# Patient Record
Sex: Male | Born: 1935 | Race: Black or African American | Hispanic: No | Marital: Married | Smoking: Former smoker
Health system: Southern US, Community
[De-identification: ages and names within clinical notes are randomized; demographics above are authoritative.]

## PROBLEM LIST (undated history)

## (undated) DIAGNOSIS — I255 Ischemic cardiomyopathy: Secondary | ICD-10-CM

## (undated) DIAGNOSIS — R001 Bradycardia, unspecified: Secondary | ICD-10-CM

## (undated) DIAGNOSIS — I509 Heart failure, unspecified: Secondary | ICD-10-CM

## (undated) DIAGNOSIS — U071 COVID-19: Secondary | ICD-10-CM

## (undated) DIAGNOSIS — I2510 Atherosclerotic heart disease of native coronary artery without angina pectoris: Secondary | ICD-10-CM

## (undated) DIAGNOSIS — I447 Left bundle-branch block, unspecified: Secondary | ICD-10-CM

## (undated) DIAGNOSIS — E785 Hyperlipidemia, unspecified: Secondary | ICD-10-CM

## (undated) DIAGNOSIS — J1282 Pneumonia due to coronavirus disease 2019: Secondary | ICD-10-CM

## (undated) DIAGNOSIS — I10 Essential (primary) hypertension: Secondary | ICD-10-CM

## (undated) DIAGNOSIS — E119 Type 2 diabetes mellitus without complications: Secondary | ICD-10-CM

## (undated) DIAGNOSIS — Z95810 Presence of automatic (implantable) cardiac defibrillator: Secondary | ICD-10-CM

## (undated) DIAGNOSIS — N189 Chronic kidney disease, unspecified: Secondary | ICD-10-CM

## (undated) HISTORY — DX: Pneumonia due to coronavirus disease 2019: J12.82

## (undated) HISTORY — DX: COVID-19: U07.1

## (undated) HISTORY — DX: Type 2 diabetes mellitus without complications: E11.9

## (undated) HISTORY — DX: Left bundle-branch block, unspecified: I44.7

## (undated) HISTORY — DX: Bradycardia, unspecified: R00.1

## (undated) HISTORY — DX: Ischemic cardiomyopathy: I25.5

## (undated) HISTORY — DX: Essential (primary) hypertension: I10

## (undated) HISTORY — DX: Hyperlipidemia, unspecified: E78.5

## (undated) HISTORY — DX: Atherosclerotic heart disease of native coronary artery without angina pectoris: I25.10

---

## 2005-10-15 ENCOUNTER — Encounter: Payer: Self-pay | Admitting: Cardiology

## 2006-06-29 ENCOUNTER — Encounter: Payer: Self-pay | Admitting: Cardiology

## 2008-04-02 ENCOUNTER — Ambulatory Visit: Payer: Self-pay | Admitting: Cardiology

## 2008-04-07 ENCOUNTER — Encounter: Payer: Self-pay | Admitting: Cardiology

## 2008-04-15 ENCOUNTER — Ambulatory Visit: Payer: Self-pay | Admitting: Cardiology

## 2008-04-15 ENCOUNTER — Encounter: Payer: Self-pay | Admitting: Cardiology

## 2008-05-11 ENCOUNTER — Ambulatory Visit: Payer: Self-pay | Admitting: Cardiology

## 2009-03-10 ENCOUNTER — Ambulatory Visit: Payer: Self-pay | Admitting: Cardiology

## 2009-07-28 ENCOUNTER — Encounter: Payer: Self-pay | Admitting: Cardiology

## 2009-08-04 ENCOUNTER — Encounter: Payer: Self-pay | Admitting: Cardiology

## 2009-09-07 ENCOUNTER — Encounter: Payer: Self-pay | Admitting: Cardiology

## 2009-09-08 ENCOUNTER — Ambulatory Visit: Payer: Self-pay | Admitting: Cardiology

## 2010-07-11 ENCOUNTER — Telehealth (INDEPENDENT_AMBULATORY_CARE_PROVIDER_SITE_OTHER): Payer: Self-pay | Admitting: *Deleted

## 2010-07-12 NOTE — Letter (Signed)
Summary: Appointment- Rescheduled  [facility] at [facility] [address]   [place], [place] [address]   Phone: [phone]  Fax: [phone]     August 04, 2009 MRN: [id number]     Allen [name] [address] [place], [place]  [address]      Dear Mr. GEARIN,   Due to a change in our office schedule, your appointment on September 01, 2009                    at  3:00 pm must be changed.    Your new appointment is scheduled for March 30 at 2:00 pm.  We look forward to participating in your health care needs.   Please contact us at the number listed above at your earliest convenience to reschedule this appointment if needed.      Sincerely,  Glass blower/designer

## 2010-07-12 NOTE — Assessment & Plan Note (Signed)
Summary: 6 MONTH FU RECV REMINDER VS   Visit Type:  Follow-up Primary Provider:  Erasmo Downer, MD  CC:  aortic valve disease.  History of Present Illness: The patient is seen for bradycardia and aortic valve disease.  I saw him last September, 2010.  He had some bradycardia.  He has good LV function with some aortic valvular disease.  He had some dizziness.  He is not having any recurrent dizziness.  He has not had chest pain shortness of breath syncope or presyncope.  Preventive Screening-Counseling & Management  Alcohol-Tobacco     Smoking Status: current     Packs/Day: <0.25  Problems Prior to Update: 1)  Abnormal Chest X-ray  () 2)  Aortic Insufficiency  (ICD-424.1) 3)  Aortic Valve Sclerosis  (ICD-424.1) 4)  Bradycardia  (ICD-427.89) 5)  Dizziness  (ICD-780.4) 6)  Tobacco Abuse  (ICD-305.1) 7)  Dyslipidemia  (ICD-272.4) 8)  Hypertension  (ICD-401.9)  Current Medications (verified): 1)  Lisinopril-Hydrochlorothiazide 20-12.5 Mg Tabs (Lisinopril-Hydrochlorothiazide) .... Take 1 Tablet By Mouth Once A Day 2)  Simvastatin 40 Mg Tabs (Simvastatin) .... Take One Tablet By Mouth Daily At Bedtime 3)  Metformin Hcl 500 Mg Tabs (Metformin Hcl) .... Take 1 Tablet By Mouth Two Times A Day 4)  Aspir-Low 81 Mg  (Aspirin) .... Take One Tablet Daily  Allergies (verified): No Known Drug Allergies  Comments:  Nurse/Medical Assistant: The patient's medications and allergies were reviewed with the patient and were updated in the Medication and Allergy Lists. pt has prescription bottles with him Youlanda Mighty RN (September 08, 2009 2:06 PM)  Social History: Packs/Day:  <0.25  Review of Systems       Patient denies fever, chills, headache, sweats, rash, change in vision, change in hearing, chest pain, cough, nausea vomiting, urinary symptoms.  All of the systems are reviewed and are negative.  Vital Signs:  Patient profile:   77 year old male Height:      63 inches Weight:       174.4 pounds Pulse rate:   62 / minute BP sitting:   130 / 70  (left arm) Cuff size:   regular  Vitals Entered By: Youlanda Mighty RN (September 08, 2009 2:04 PM) CC: aortic valve disease   Physical Exam  General:  patient is stable. Head:  head is atraumatic. Eyes:  no xanthelasma. Neck:  no jugular venous distention.  He does have a radiated murmur to the neck. Chest Wall:  there is no chest wall tenderness. Lungs:  lungs are clear.  Respiratory effort is nonlabored. Heart:  cardiac exam reveals S1 and S2.  No clicks.  There is a systolic murmur. Abdomen:  abdomen is soft. Msk:  no musculoskeletal deformities. Extremities:  no peripheral edema. Skin:  no skin rashes. Psych:  patient is oriented to person time and place.  Affect is normal.   Impression & Recommendations:  Problem # 1:  * ABNORMAL CHEST X-RAY In the past the patient had an abnormal chest x-ray questioning whether he had a lesion on his spine.  I arranged for followup films.  These were done and there was no osteolytic lesion.  Therefore he no longer has any significant problem in this regard.  Problem # 2:  AORTIC INSUFFICIENCY (ICD-424.1)  His updated medication list for this problem includes:    Lisinopril-hydrochlorothiazide 20-12.5 Mg Tabs (Lisinopril-hydrochlorothiazide) .Marland Kitchen... Take 1 tablet by mouth once a day Historically there is mild aortic insufficiency.  He does not need a followup echo now  but we will consider it at the time of his next visit.  Problem # 3:  BRADYCARDIA (ICD-427.89)  His updated medication list for this problem includes:    Lisinopril-hydrochlorothiazide 20-12.5 Mg Tabs (Lisinopril-hydrochlorothiazide) .Marland Kitchen... Take 1 tablet by mouth once a day Patient has continued bradycardia.  EKG is done today and reviewed by me.  There is sinus bradycardia.  One PVC is noted.  He does not have symptoms.  No further workup is needed.  Problem # 4:  DIZZINESS (ICD-780.4) He is not having any  ongoing dizziness.  No further workup needed.  Problem # 5:  TOBACCO ABUSE (ICD-305.1) The patient tells me that he is not using any tobacco products.  Other Orders: EKG w/ Interpretation (93000)  Patient Instructions: 1)  Your physician recommends that you continue on your current medications as directed. Please refer to the Current Medication list given to you today. 2)  Your physician wants you to follow-up in: 9 months. You will receive a reminder letter in the mail about two months in advance. If you don't receive a letter, please call our office to schedule the follow-up appointment.

## 2010-07-12 NOTE — Miscellaneous (Signed)
  Clinical Lists Changes  Observations: Added new observation of PAST MED HX: hypertension Hyperlipidemia Tobacco abuse Creatinine 1.4 Dizziness.Maryruth Bun..October, 2009.. Bradycardia noted but dizziness probably vertigo /  Holter monitorNovember, 2009..sinus bradycardia... rare wide complex junctional escape rate at 60 Aortic sclerosis but no stenosis.Marland Kitchen echo October, 2009 Aortic insufficiency.. mild-to-moderate... echo,  October, 2009 left ventricular hypertrophy..mild to moderate..echo.. October, 2009 EF   60-65%.Marland Kitchenecho October, 2009 obstructive sleep apnea...????  possible Question Abn. CXRay 2008  ?? L2. /  spine films 02/2009  OK...no osteolytic lesions seen (09/07/2009 11:21) Added new observation of PRIMARY MD: Linna Darner (09/07/2009 11:21)       Past History:  Past Medical History: hypertension Hyperlipidemia Tobacco abuse Creatinine 1.4 Dizziness.Maryruth Bun..October, 2009.. Bradycardia noted but dizziness probably vertigo /  Holter monitorNovember, 2009..sinus bradycardia... rare wide complex junctional escape rate at 60 Aortic sclerosis but no stenosis.Marland Kitchen echo October, 2009 Aortic insufficiency.. mild-to-moderate... echo,  October, 2009 left ventricular hypertrophy..mild to moderate..echo.. October, 2009 EF   60-65%.Marland Kitchenecho October, 2009 obstructive sleep apnea...????  possible Question Abn. CXRay 2008  ?? L2. /  spine films 02/2009  OK...no osteolytic lesions seen

## 2010-07-20 NOTE — Progress Notes (Signed)
Summary: QUESTION ABOUT MEDS  Phone Note Call from Patient Call back at Home Phone [phone]   Reason for Call: Talk to Nurse Details for Reason: MEDICINE Summary of Call: PATIENT LEFT MESSAGE IN [facility] (MY FAULT). HE HAS A QUESTION ABOUT ANTIBOTICS THAT DR. KATZ HAS HIM ON?????? AN APPOINTMENT WAS FOR MARCH 5 @ 8:30 Initial call taken by: Claudette Laws,  July 11, 2010 9:28 AM  Follow-up for Phone Call        Male answered the phone when returned call. Asked to s/w pt. She states he's not home. Notified male I was returning a call. She said okay and hung up before I could ask her to have pt call the office. Cyril Loosen, RN, BSN  July 11, 2010 4:38 PM   Additional Follow-up for Phone Call Additional follow up Details #1::        PATIENT CALLED BACK THIS AM WANTING TO SPEAK WITH NURSE AND DEMANDING TO BE SEEN END OF JAN. (TODAY) DUE TO DR. KATZ'S TELLING HIM HE NEEDS TO BE SEEN AFTER TAKING 80 PILLS????? PLEASE CALL HIM AT [phone] Additional Follow-up by: Claudette Laws,  July 12, 2010 8:56 AM    Additional Follow-up for Phone Call Additional follow up Details #2::    Spoke with patient. He states he was given a prescription for 80 pills of antibiotic and then he was suppose to see [place] back. He states this appt was suppose to be for the end of the month but he was told by our staff that he did not have an appt with [place]. Upon further questioning, pt is referring to Doxycycline tha twas prescribed by Dr. Ian Bushman. Pt notified he should contact Dr. Gemma Payor office regarding this.   Pt was also reminded that he has f/u appt with [place] for Monday, March 5th at 8:30am following no show on 12/5. Pt reminded to keep this appt. Pt verbalized understanding.  Follow-up by: Cyril Loosen, RN, BSN,  July 12, 2010 10:28 AM

## 2010-08-15 ENCOUNTER — Ambulatory Visit (INDEPENDENT_AMBULATORY_CARE_PROVIDER_SITE_OTHER): Payer: Medicare Other | Admitting: Cardiology

## 2010-08-15 ENCOUNTER — Encounter: Payer: Self-pay | Admitting: Cardiology

## 2010-08-15 DIAGNOSIS — I359 Nonrheumatic aortic valve disorder, unspecified: Secondary | ICD-10-CM

## 2010-08-15 DIAGNOSIS — I498 Other specified cardiac arrhythmias: Secondary | ICD-10-CM

## 2010-08-23 NOTE — Assessment & Plan Note (Signed)
Summary: ROV PER LETTER RCVD -SRS   Visit Type:  Follow-up Primary Provider:  Gerarda Fraction  CC:  aortic valve sclerosis.  History of Present Illness: The patient is seen for followup of aortic valve sclerosis.  He continues to smoke.  I have encouraged him to stop.  He had some dizziness in the past.  He has not had any recent dizziness.  Preventive Screening-Counseling & Management  Alcohol-Tobacco     Smoking Status: current     Smoking Cessation Counseling: yes     Packs/Day: <1/2 PPD  Current Medications (verified): 1)  Lisinopril-Hydrochlorothiazide 20-12.5 Mg Tabs (Lisinopril-Hydrochlorothiazide) .... Take 1 Tablet By Mouth Once A Day 2)  Simvastatin 40 Mg Tabs (Simvastatin) .... Take One Tablet By Mouth Daily At Bedtime 3)  Metformin Hcl 500 Mg Tabs (Metformin Hcl) .... Take 1 Tablet By Mouth Two Times A Day 4)  Aspir-Low 81 Mg  (Aspirin) .... Take One Tablet Daily 5)  Vitamin D3 2000 Unit Caps (Cholecalciferol) .... Take 1 Tablet By Mouth Once A Day 6)  Meloxicam 15 Mg Tabs (Meloxicam) .... Take 1 Tablet By Mouth Once A Day  Allergies (verified): No Known Drug Allergies  Comments:  Nurse/Medical Assistant: The patient's medication bottles and allergies were reviewed with the patient and were updated in the Medication and Allergy Lists.  Past History:  Past Medical History: hypertension Hyperlipidemia Tobacco abuse Creatinine 1.4 Dizziness.Maryruth Bun..October, 2009.. Bradycardia noted but dizziness probably vertigo /  Holter monitorNovember, 2009..sinus bradycardia... rare wide complex junctional escape rate at 60 Aortic sclerosis but no stenosis.Marland Kitchen echo October, 2009 Aortic insufficiency.. mild-to-moderate... echo,  October, 2009 left ventricular hypertrophy..mild to moderate..echo.. October, 2009 EF   60-65%.Marland Kitchenecho October, 2009 obstructive sleep apnea...????  possible Question Abn. CXRay 2008  ?? L2. /  spine films 02/2009  OK...no osteolytic lesions  seen..  Social History: Packs/Day:  <1/2 PPD  Review of Systems       Patient denies fever, chills, headache, sweats, rash, change in vision, change in hearing, chest pain, cough, nausea vomiting, urinary symptoms.  All other systems are reviewed and are negative.  Vital Signs:  Patient profile:   77 year old male Height:      63 inches Weight:      174 pounds BMI:     30.93 Pulse rate:   51 / minute BP sitting:   153 / 90  (left arm) Cuff size:   regular  Vitals Entered By: Carlye Grippe (August 15, 2010 8:22 AM)  Nutrition Counseling: Patient's BMI is greater than 25 and therefore counseled on weight management options.  Physical Exam  General:  patient is stable.  He does smell of cigarette smoke. Head:  head is atraumatic. Eyes:  no xanthelasma. Neck:  no jugular venous distention.  No carotid bruits. Lungs:  lungs are clear.  Respiratory effort is not labored. Heart:  cardiac exam reveals S1 and S2.  There is a 2/6 crescendo decrescendo systolic murmur. Abdomen:  abdomen is soft. Extremities:  no peripheral edema. Psych:  patient is oriented to person time and place.  Affect is normal.   Impression & Recommendations:  Problem # 1:  AORTIC VALVE SCLEROSIS (ICD-424.1) The patient has had aortic valve sclerosis in the past.  His last echo was done in 2009.  I do not hear significant change in the murmur.  He does not need a followup echo now.  We will consider followup next year.  Problem # 2:  BRADYCARDIA (ICD-427.89) EKG is done today and  reviewed by me.  He has continued sinus bradycardia.  This is not causing any problems.  No further workup needed.  Problem # 3:  TOBACCO ABUSE (ICD-305.1) I have counseled the patient to stop smoking.  Problem # 4:  HYPERTENSION (ICD-401.9) The patient's blood pressure is mildly elevated today.  He saw his primary physician recently and his blood pressure was normal.  No change in therapy.  He needs followup blood pressure checks  with his primary physician.  Other Orders: EKG w/ Interpretation (93000)  Patient Instructions: 1)  Your physician wants you to follow-up in: 1 year. You will receive a reminder letter in the mail one-two months in advance. If you don't receive a letter, please call our office to schedule the follow-up appointment. 2)  Your physician recommends that you continue on your current medications as directed. Please refer to the Current Medication list given to you today.

## 2010-10-25 NOTE — Assessment & Plan Note (Signed)
[facility]                          [facility] OFFICE NOTE   NAME:Allen [name], Allen [name]                        MRN:          [id number]  DATE:05/11/2008                            DOB:          [birth year]    Allen [name] was seen in the office.  He had been seen in consultation at  [facility].  That consult was done on April 03, 2008.  At that  time, the patient had hypertension.  He also had dizziness, and we were  asked to be sure he did not have a significant cardiac basis for it.  He  has sinus bradycardia and he had a systolic murmur.  He was discharged  with plans for further outpatient workup.  Since discharge, he has had a  2-D echo.  I have reviewed the report.  He has good LV function.  There  was mild aortic valvular disease.  He has also had a 48-hour Holter  monitor.  I have reviewed the strips and the report.  The patient does  have some sinus bradycardia.  He also has some episodes of a wider-  complex escape rhythm with a rate of approximately 60.  He is not on  beta-blockers at the time of his monitor.  He did not have any  significant symptoms.   Additional problem today is the mention by his wife that he has  significant snoring and periods of apnea at nighttime while sleeping.  I  discussed this with them, and I have recommended that he talk with Dr.  Linna Darner about a possible sleep study.   PAST MEDICAL HISTORY:   ALLERGIES:  No known drug allergies.   MEDICATIONS:  Lisinopril, hydrochlorothiazide, and aspirin.   SOCIAL HISTORY:  The patient is retired from [employer].  He does continue  to smoke intermittently.  He is married.   FAMILY HISTORY:  There is no strong family history of coronary artery  disease at a young age.   REVIEW OF SYSTEMS:  He is not having any fevers or chills.  He has no  headaches.  There are no skin rashes.  He has no GI or GU symptoms.  His  review of systems otherwise is negative.   PHYSICAL  EXAMINATION:  VITAL SIGNS:  Blood pressure is 128/85 with a  pulse of 50.  Weight is 173 pounds.  GENERAL:  The patient is oriented to person, time, and place.  Affect is  normal.  He is here with his wife today.  HEENT:  No xanthelasma.  He has normal extraocular motion.  There are no  carotid bruits.  There is no jugular venous distention.  LUNGS:  Clear.  Respiratory effort is not labored.  CARDIAC:  S1 with an S2.  There is a 2/6 systolic murmur.  ABDOMEN:  Soft.  EXTREMITIES:  He has no peripheral edema.   A 2-D echo was done.  The study shows;  1. Mild-to-moderate concentric left ventricular hypertrophy.  2. Ejection fraction 60-65%.  3. Mild aortic cusp sclerosis.  4. Mild-to-moderate aortic regurgitation.  5. No evidence of aortic stenosis.  The patient had a Holter monitor as described above.   PROBLEMS:  1. History of hypertension, treated at this time.  2. Left ventricular hypertrophy by echo.  3. Hyperlipidemia.  This needs to be assessed through Dr. Linna Darner.  4. Smoking history.  He once again is reminded to stop.  5. Creatinine 1.4.  6. Systolic murmur.  This is from his aortic sclerosis and mild-to-      moderate aortic regurgitation.  7. Normal systolic left ventricular function.  8. Presentation to the hospital with dizziness.  I still think this      was mostly vertigo.  We have to follow him, however, regarding his      bradycardia.  9. Sinus bradycardia.  He is stable.  However, we cannot use beta-      blockers.  He does have a wide-complex escape rhythm that occurs at      the rate of approximately 60 beats per minute, and this has to be      kept in mind.  10.Description by his wife of probable sleep apnea.  I have asked him      to discuss with Dr. Linna Darner this finding and decide if a sleep study      would be appropriate.   The patient is to be followed.  I have recommended no further tests.  If  he has further symptoms, he may need further evaluation  and a pacemaker  might be needed.  However, at this point, he can be followed off any  beta-blockers.     Luis Abed, MD, [facility]  Electronically Signed    JDK/MedQ  DD: 05/11/2008  DT: 05/11/2008  Job #: [id number]   cc:   Erasmo Downer, MD

## 2010-12-08 ENCOUNTER — Encounter: Payer: Self-pay | Admitting: Cardiology

## 2011-08-10 DIAGNOSIS — I10 Essential (primary) hypertension: Secondary | ICD-10-CM | POA: Diagnosis not present

## 2011-08-10 DIAGNOSIS — E782 Mixed hyperlipidemia: Secondary | ICD-10-CM | POA: Diagnosis not present

## 2011-08-10 DIAGNOSIS — N401 Enlarged prostate with lower urinary tract symptoms: Secondary | ICD-10-CM | POA: Diagnosis not present

## 2011-08-18 ENCOUNTER — Ambulatory Visit: Payer: Medicare Other | Admitting: Cardiology

## 2011-08-18 ENCOUNTER — Encounter: Payer: Self-pay | Admitting: Cardiology

## 2011-08-18 DIAGNOSIS — I10 Essential (primary) hypertension: Secondary | ICD-10-CM | POA: Insufficient documentation

## 2011-08-18 DIAGNOSIS — E785 Hyperlipidemia, unspecified: Secondary | ICD-10-CM | POA: Insufficient documentation

## 2011-08-18 DIAGNOSIS — R42 Dizziness and giddiness: Secondary | ICD-10-CM | POA: Insufficient documentation

## 2011-08-18 DIAGNOSIS — R9430 Abnormal result of cardiovascular function study, unspecified: Secondary | ICD-10-CM | POA: Insufficient documentation

## 2011-08-18 DIAGNOSIS — I517 Cardiomegaly: Secondary | ICD-10-CM | POA: Insufficient documentation

## 2011-08-18 DIAGNOSIS — R9389 Abnormal findings on diagnostic imaging of other specified body structures: Secondary | ICD-10-CM | POA: Insufficient documentation

## 2011-08-18 DIAGNOSIS — R001 Bradycardia, unspecified: Secondary | ICD-10-CM | POA: Insufficient documentation

## 2011-08-18 DIAGNOSIS — I358 Other nonrheumatic aortic valve disorders: Secondary | ICD-10-CM | POA: Insufficient documentation

## 2011-08-18 DIAGNOSIS — G4733 Obstructive sleep apnea (adult) (pediatric): Secondary | ICD-10-CM | POA: Insufficient documentation

## 2011-08-18 DIAGNOSIS — I351 Nonrheumatic aortic (valve) insufficiency: Secondary | ICD-10-CM | POA: Insufficient documentation

## 2011-08-18 DIAGNOSIS — Z720 Tobacco use: Secondary | ICD-10-CM | POA: Insufficient documentation

## 2011-08-18 DIAGNOSIS — N189 Chronic kidney disease, unspecified: Secondary | ICD-10-CM | POA: Insufficient documentation

## 2011-09-04 ENCOUNTER — Ambulatory Visit (INDEPENDENT_AMBULATORY_CARE_PROVIDER_SITE_OTHER): Payer: Medicare Other | Admitting: Cardiology

## 2011-09-04 ENCOUNTER — Encounter: Payer: Self-pay | Admitting: Cardiology

## 2011-09-04 VITALS — BP 118/76 | HR 48 | Ht 62.0 in | Wt 176.8 lb

## 2011-09-04 DIAGNOSIS — R42 Dizziness and giddiness: Secondary | ICD-10-CM

## 2011-09-04 DIAGNOSIS — R001 Bradycardia, unspecified: Secondary | ICD-10-CM

## 2011-09-04 DIAGNOSIS — I359 Nonrheumatic aortic valve disorder, unspecified: Secondary | ICD-10-CM | POA: Diagnosis not present

## 2011-09-04 DIAGNOSIS — I351 Nonrheumatic aortic (valve) insufficiency: Secondary | ICD-10-CM

## 2011-09-04 DIAGNOSIS — Z720 Tobacco use: Secondary | ICD-10-CM

## 2011-09-04 DIAGNOSIS — I498 Other specified cardiac arrhythmias: Secondary | ICD-10-CM | POA: Diagnosis not present

## 2011-09-04 DIAGNOSIS — F17200 Nicotine dependence, unspecified, uncomplicated: Secondary | ICD-10-CM | POA: Diagnosis not present

## 2011-09-04 DIAGNOSIS — I10 Essential (primary) hypertension: Secondary | ICD-10-CM

## 2011-09-04 DIAGNOSIS — I358 Other nonrheumatic aortic valve disorders: Secondary | ICD-10-CM

## 2011-09-04 NOTE — Patient Instructions (Signed)
Your physician you to follow up in 1 year. You will receive a reminder letter in the mail one-two months in advance. If you don't receive a letter, please call our office to schedule the follow-up appointment. Your physician recommends that you continue on your current medications as directed. Please refer to the Current Medication list given to you today. 

## 2011-09-04 NOTE — Assessment & Plan Note (Signed)
Patient continues to smoke a small amount. I've encouraged him to stop completely.

## 2011-09-04 NOTE — Assessment & Plan Note (Signed)
Patient has chronic mild sinus bradycardia. This has not changed. No change in therapy.

## 2011-09-04 NOTE — Assessment & Plan Note (Signed)
He's had no recurrent significant dizziness. No further workup.

## 2011-09-04 NOTE — Assessment & Plan Note (Signed)
Blood pressure is controlled. No change in therapy. 

## 2011-09-04 NOTE — Progress Notes (Signed)
HPI Patient is seen today to followup a history of mild aortic valve disease and an episode of severe dizziness in the remote past. I saw him last March, 2012. He's doing well. Unfortunately he continues to smoke a small amount. He's not had any syncope or presyncope. There is no chest pain. There is no significant shortness of breath. He's not having any significant palpitations.  As part of today's evaluation I have completely reviewed his old cardiac records and I have completely updated the new electronic medical record.  No Known Allergies  Current Outpatient Prescriptions  Medication Sig Dispense Refill  . aspirin 81 MG tablet Take 81 mg by mouth daily.        Marland Kitchen lisinopril-hydrochlorothiazide (PRINZIDE,ZESTORETIC) 20-12.5 MG per tablet Take 1 tablet by mouth daily.        . meloxicam (MOBIC) 7.5 MG tablet Take 7.5 mg by mouth daily.      . metFORMIN (GLUCOPHAGE) 500 MG tablet Take 500 mg by mouth 2 (two) times daily.        . simvastatin (ZOCOR) 40 MG tablet Take 40 mg by mouth at bedtime.          History   Social History  . Marital Status: Married    Spouse Name: N/A    Number of Children: N/A  . Years of Education: N/A   Occupational History  . [name]     Retired   Social History Main Topics  . Smoking status: Current Some Day Smoker  . Smokeless tobacco: Not on file  . Alcohol Use: Not on file  . Drug Use: Not on file  . Sexually Active: Not on file   Other Topics Concern  . Not on file   Social History Narrative  . No narrative on file    Family History  Problem Relation Age of Onset  . Coronary artery disease Neg Hx     No strong family history    Past Medical History  Diagnosis Date  . Hypertension   . Hyperlipidemia   . Tobacco abuse   . Chronic kidney disease     Creatinine 1.4  . Dizziness 10/09    [place]; bradycardia noted but dizziness probably vertigo  . Sinus bradycardia 11/09    Holter monitor; rare wide complex junctional escape  rate at 60  . Aortic valve sclerosis 10/09    No stenosis, echo, October, 2009  . Aortic insufficiency 10/09    Mild to moderate, Echo, October, 2009  . Left ventricular hypertrophy 10/09    , Mild to moderate, echo,  2009  . Obstructive sleep apnea     ??? possible  . Abnormal chest x-ray 1. 2008  2. 9/10    1. Question Abn. CXRay ?? L2  2. Spine films, OK no osteolytic lesions seen  . Ejection fraction      EF 60-65%, echo, October, 2009    No past surgical history on file.  ROS  Patient denies fever, chills, headache, sweats, rash, change in vision, change in hearing, chest pain, cough, nausea vomiting, urinary symptoms. All other systems are reviewed and are negative.  PHYSICAL EXAM  Patient is stable. He is oriented to person time and place. Affect is normal. Head is atraumatic. There is no xanthelasma. Carotid upstrokes are good. There is no jugulovenous distention. Lungs are clear. Respiratory effort is nonlabored. Cardiac exam reveals S1 and S2. There is a 2/6 systolic murmur area the abdomen is soft. There is no peripheral  edema. There no musculoskeletal deformities. There are no skin rashes.  Filed Vitals:   09/04/11 1410  BP: 118/76  Pulse: 48  Height: 5\' 2"  (1.575 m)  Weight: 176 lb 12.8 oz (80.196 kg)  SpO2: 98%   EKG is done today and reviewed by me. There is sinus bradycardia with a rate of 50.  ASSESSMENT & PLAN

## 2011-09-04 NOTE — Assessment & Plan Note (Signed)
Patient has aortic valvular disease. There is no evidence on physical exam that he has had any significant change. I decided not to do an echo but we will plan to do one next year.

## 2011-10-20 ENCOUNTER — Encounter: Payer: Self-pay | Admitting: Cardiology

## 2011-10-20 DIAGNOSIS — R05 Cough: Secondary | ICD-10-CM | POA: Diagnosis not present

## 2011-10-20 DIAGNOSIS — I517 Cardiomegaly: Secondary | ICD-10-CM | POA: Diagnosis not present

## 2011-10-20 DIAGNOSIS — E119 Type 2 diabetes mellitus without complications: Secondary | ICD-10-CM | POA: Diagnosis not present

## 2011-10-20 DIAGNOSIS — I10 Essential (primary) hypertension: Secondary | ICD-10-CM | POA: Diagnosis not present

## 2011-10-20 DIAGNOSIS — F17200 Nicotine dependence, unspecified, uncomplicated: Secondary | ICD-10-CM | POA: Diagnosis not present

## 2011-10-20 DIAGNOSIS — Z79899 Other long term (current) drug therapy: Secondary | ICD-10-CM | POA: Diagnosis not present

## 2011-10-20 DIAGNOSIS — R0989 Other specified symptoms and signs involving the circulatory and respiratory systems: Secondary | ICD-10-CM | POA: Diagnosis not present

## 2011-10-20 DIAGNOSIS — R0602 Shortness of breath: Secondary | ICD-10-CM | POA: Diagnosis not present

## 2011-10-20 DIAGNOSIS — J209 Acute bronchitis, unspecified: Secondary | ICD-10-CM | POA: Diagnosis not present

## 2011-10-20 DIAGNOSIS — Z7982 Long term (current) use of aspirin: Secondary | ICD-10-CM | POA: Diagnosis not present

## 2011-10-26 DIAGNOSIS — J209 Acute bronchitis, unspecified: Secondary | ICD-10-CM | POA: Diagnosis not present

## 2011-11-09 DIAGNOSIS — J449 Chronic obstructive pulmonary disease, unspecified: Secondary | ICD-10-CM | POA: Diagnosis not present

## 2011-11-09 DIAGNOSIS — I10 Essential (primary) hypertension: Secondary | ICD-10-CM | POA: Diagnosis not present

## 2011-11-24 DIAGNOSIS — E119 Type 2 diabetes mellitus without complications: Secondary | ICD-10-CM | POA: Diagnosis not present

## 2011-11-24 DIAGNOSIS — R972 Elevated prostate specific antigen [PSA]: Secondary | ICD-10-CM | POA: Diagnosis not present

## 2012-02-09 DIAGNOSIS — H109 Unspecified conjunctivitis: Secondary | ICD-10-CM | POA: Diagnosis not present

## 2012-02-09 DIAGNOSIS — J449 Chronic obstructive pulmonary disease, unspecified: Secondary | ICD-10-CM | POA: Diagnosis not present

## 2012-05-30 DIAGNOSIS — J449 Chronic obstructive pulmonary disease, unspecified: Secondary | ICD-10-CM | POA: Diagnosis not present

## 2012-05-30 DIAGNOSIS — E782 Mixed hyperlipidemia: Secondary | ICD-10-CM | POA: Diagnosis not present

## 2012-06-10 DIAGNOSIS — Z23 Encounter for immunization: Secondary | ICD-10-CM | POA: Diagnosis not present

## 2012-07-26 DIAGNOSIS — Z7982 Long term (current) use of aspirin: Secondary | ICD-10-CM | POA: Diagnosis not present

## 2012-07-26 DIAGNOSIS — J019 Acute sinusitis, unspecified: Secondary | ICD-10-CM | POA: Diagnosis not present

## 2012-07-26 DIAGNOSIS — Z79899 Other long term (current) drug therapy: Secondary | ICD-10-CM | POA: Diagnosis not present

## 2012-07-26 DIAGNOSIS — J329 Chronic sinusitis, unspecified: Secondary | ICD-10-CM | POA: Diagnosis not present

## 2012-07-26 DIAGNOSIS — E119 Type 2 diabetes mellitus without complications: Secondary | ICD-10-CM | POA: Diagnosis not present

## 2012-07-26 DIAGNOSIS — I10 Essential (primary) hypertension: Secondary | ICD-10-CM | POA: Diagnosis not present

## 2012-07-26 DIAGNOSIS — Z87891 Personal history of nicotine dependence: Secondary | ICD-10-CM | POA: Diagnosis not present

## 2012-08-29 DIAGNOSIS — J449 Chronic obstructive pulmonary disease, unspecified: Secondary | ICD-10-CM | POA: Diagnosis not present

## 2012-08-29 DIAGNOSIS — Z0000 Encounter for general adult medical examination without abnormal findings: Secondary | ICD-10-CM | POA: Diagnosis not present

## 2012-08-29 DIAGNOSIS — Z125 Encounter for screening for malignant neoplasm of prostate: Secondary | ICD-10-CM | POA: Diagnosis not present

## 2012-09-02 ENCOUNTER — Ambulatory Visit: Payer: Medicare Other | Admitting: Cardiology

## 2012-09-18 DIAGNOSIS — Z23 Encounter for immunization: Secondary | ICD-10-CM | POA: Diagnosis not present

## 2012-09-23 DIAGNOSIS — R972 Elevated prostate specific antigen [PSA]: Secondary | ICD-10-CM | POA: Diagnosis not present

## 2012-09-23 DIAGNOSIS — R3915 Urgency of urination: Secondary | ICD-10-CM | POA: Diagnosis not present

## 2012-09-23 DIAGNOSIS — R39198 Other difficulties with micturition: Secondary | ICD-10-CM | POA: Diagnosis not present

## 2012-09-30 DIAGNOSIS — E7800 Pure hypercholesterolemia, unspecified: Secondary | ICD-10-CM | POA: Diagnosis not present

## 2012-09-30 DIAGNOSIS — E119 Type 2 diabetes mellitus without complications: Secondary | ICD-10-CM | POA: Diagnosis not present

## 2012-09-30 DIAGNOSIS — Z13820 Encounter for screening for osteoporosis: Secondary | ICD-10-CM | POA: Diagnosis not present

## 2012-09-30 DIAGNOSIS — Z7982 Long term (current) use of aspirin: Secondary | ICD-10-CM | POA: Diagnosis not present

## 2012-09-30 DIAGNOSIS — Z79899 Other long term (current) drug therapy: Secondary | ICD-10-CM | POA: Diagnosis not present

## 2012-09-30 DIAGNOSIS — M129 Arthropathy, unspecified: Secondary | ICD-10-CM | POA: Diagnosis not present

## 2012-09-30 DIAGNOSIS — M810 Age-related osteoporosis without current pathological fracture: Secondary | ICD-10-CM | POA: Diagnosis not present

## 2012-09-30 DIAGNOSIS — I10 Essential (primary) hypertension: Secondary | ICD-10-CM | POA: Diagnosis not present

## 2012-11-26 DIAGNOSIS — R972 Elevated prostate specific antigen [PSA]: Secondary | ICD-10-CM | POA: Diagnosis not present

## 2012-11-26 DIAGNOSIS — I10 Essential (primary) hypertension: Secondary | ICD-10-CM | POA: Diagnosis not present

## 2012-11-26 DIAGNOSIS — R39198 Other difficulties with micturition: Secondary | ICD-10-CM | POA: Diagnosis not present

## 2012-11-26 DIAGNOSIS — N401 Enlarged prostate with lower urinary tract symptoms: Secondary | ICD-10-CM | POA: Diagnosis not present

## 2012-11-26 DIAGNOSIS — R351 Nocturia: Secondary | ICD-10-CM | POA: Diagnosis not present

## 2012-11-26 DIAGNOSIS — R350 Frequency of micturition: Secondary | ICD-10-CM | POA: Diagnosis not present

## 2012-11-29 DIAGNOSIS — J449 Chronic obstructive pulmonary disease, unspecified: Secondary | ICD-10-CM | POA: Diagnosis not present

## 2012-11-29 DIAGNOSIS — I10 Essential (primary) hypertension: Secondary | ICD-10-CM | POA: Diagnosis not present

## 2012-11-29 DIAGNOSIS — E782 Mixed hyperlipidemia: Secondary | ICD-10-CM | POA: Diagnosis not present

## 2013-03-04 DIAGNOSIS — I10 Essential (primary) hypertension: Secondary | ICD-10-CM | POA: Diagnosis not present

## 2013-03-04 DIAGNOSIS — E782 Mixed hyperlipidemia: Secondary | ICD-10-CM | POA: Diagnosis not present

## 2013-04-04 DIAGNOSIS — Z23 Encounter for immunization: Secondary | ICD-10-CM | POA: Diagnosis not present

## 2013-06-10 DIAGNOSIS — I10 Essential (primary) hypertension: Secondary | ICD-10-CM | POA: Diagnosis not present

## 2013-06-10 DIAGNOSIS — E782 Mixed hyperlipidemia: Secondary | ICD-10-CM | POA: Diagnosis not present

## 2013-06-17 DIAGNOSIS — I10 Essential (primary) hypertension: Secondary | ICD-10-CM | POA: Diagnosis not present

## 2013-06-17 DIAGNOSIS — E119 Type 2 diabetes mellitus without complications: Secondary | ICD-10-CM | POA: Diagnosis not present

## 2013-06-17 DIAGNOSIS — J209 Acute bronchitis, unspecified: Secondary | ICD-10-CM | POA: Diagnosis not present

## 2013-06-17 DIAGNOSIS — J069 Acute upper respiratory infection, unspecified: Secondary | ICD-10-CM | POA: Diagnosis not present

## 2013-06-17 DIAGNOSIS — R059 Cough, unspecified: Secondary | ICD-10-CM | POA: Diagnosis not present

## 2013-06-17 DIAGNOSIS — R05 Cough: Secondary | ICD-10-CM | POA: Diagnosis not present

## 2013-08-29 ENCOUNTER — Encounter: Payer: Self-pay | Admitting: Cardiology

## 2013-08-29 ENCOUNTER — Ambulatory Visit (INDEPENDENT_AMBULATORY_CARE_PROVIDER_SITE_OTHER): Payer: Medicare Other | Admitting: Cardiology

## 2013-08-29 VITALS — BP 143/82 | HR 52 | Ht 62.0 in | Wt 180.1 lb

## 2013-08-29 DIAGNOSIS — I358 Other nonrheumatic aortic valve disorders: Secondary | ICD-10-CM

## 2013-08-29 DIAGNOSIS — I498 Other specified cardiac arrhythmias: Secondary | ICD-10-CM | POA: Diagnosis not present

## 2013-08-29 DIAGNOSIS — I359 Nonrheumatic aortic valve disorder, unspecified: Secondary | ICD-10-CM

## 2013-08-29 DIAGNOSIS — I10 Essential (primary) hypertension: Secondary | ICD-10-CM

## 2013-08-29 DIAGNOSIS — F17200 Nicotine dependence, unspecified, uncomplicated: Secondary | ICD-10-CM

## 2013-08-29 DIAGNOSIS — R42 Dizziness and giddiness: Secondary | ICD-10-CM | POA: Diagnosis not present

## 2013-08-29 DIAGNOSIS — E785 Hyperlipidemia, unspecified: Secondary | ICD-10-CM | POA: Diagnosis not present

## 2013-08-29 DIAGNOSIS — R001 Bradycardia, unspecified: Secondary | ICD-10-CM

## 2013-08-29 DIAGNOSIS — R0989 Other specified symptoms and signs involving the circulatory and respiratory systems: Secondary | ICD-10-CM

## 2013-08-29 DIAGNOSIS — R0609 Other forms of dyspnea: Secondary | ICD-10-CM | POA: Insufficient documentation

## 2013-08-29 DIAGNOSIS — R9430 Abnormal result of cardiovascular function study, unspecified: Secondary | ICD-10-CM

## 2013-08-29 DIAGNOSIS — Z720 Tobacco use: Secondary | ICD-10-CM

## 2013-08-29 DIAGNOSIS — R0602 Shortness of breath: Secondary | ICD-10-CM

## 2013-08-29 NOTE — Patient Instructions (Addendum)
Your physician recommends that you schedule a follow-up appointment in: October 08, 2013. Your physician recommends that you continue on your current medications as directed. Please refer to the Current Medication list given to you today. Your physician has requested that you have an echocardiogram. Echocardiography is a painless test that uses sound waves to create images of your heart. It provides your doctor with information about the size and shape of your heart and how well your heart's chambers and valves are working. This procedure takes approximately one hour. There are no restrictions for this procedure.

## 2013-08-29 NOTE — Assessment & Plan Note (Signed)
Historically his ejection fraction is normal. His last echo was 2009. He will have a followup echo now.

## 2013-08-29 NOTE — Progress Notes (Signed)
Patient ID: Allen [name], male   DOB: 10/26/1933, 80 y.o.   MRN: [id number]    HPI  Patient is seen today to followup his overall cardiac status. In the past he's had mild valvular disease. He had dizziness in the remote past but this has been better. I saw him last March, 2013. I have carefully reviewed the records since that time. He tells me that he is active. He mentions that he walks 1 mile each morning. He tends to have a sensation of needing to belch while walking. He is able to pass some gas and then he feels fine he continues his walk. He does not have chest pain or shortness of breath with this. However he mentions that other times if he were to get up to walk to his mailbox that he would feel some shortness of breath. He does not have the gas sensation at that time. He does not have chest pain.  No Known Allergies  Current Outpatient Prescriptions  Medication Sig Dispense Refill  . aspirin 81 MG tablet Take 81 mg by mouth daily.        Marland Kitchen. atorvastatin (LIPITOR) 80 MG tablet Take 80 mg by mouth daily.      Marland Kitchen. glimepiride (AMARYL) 2 MG tablet Take 2 mg by mouth 2 (two) times daily.      Marland Kitchen. LANTUS SOLOSTAR 100 UNIT/ML Solostar Pen Inject 50 Units into the skin at bedtime.      Marland Kitchen. lisinopril-hydrochlorothiazide (PRINZIDE,ZESTORETIC) 20-12.5 MG per tablet Take 1 tablet by mouth daily.        . metFORMIN (GLUCOPHAGE) 500 MG tablet Take 500 mg by mouth 2 (two) times daily.         No current facility-administered medications for this visit.    History   Social History  . Marital Status: Married    Spouse Name: N/A    Number of Children: N/A  . Years of Education: N/A   Occupational History  . [employer]     Retired   Social History Main Topics  . Smoking status: Former Smoker    Quit date: 06/13/2011  . Smokeless tobacco: Not on file  . Alcohol Use: Not on file  . Drug Use: Not on file  . Sexual Activity: Not on file   Other Topics Concern  . Not on file   Social History  Narrative  . No narrative on file    Family History  Problem Relation Age of Onset  . Coronary artery disease Neg Hx     No strong family history    Past Medical History  Diagnosis Date  . Hypertension   . Hyperlipidemia   . Tobacco abuse   . Chronic kidney disease     Creatinine 1.4  . Dizziness 10/09    [place]; bradycardia noted but dizziness probably vertigo  . Sinus bradycardia 11/09    Holter monitor; rare wide complex junctional escape rate at 60  . Aortic valve sclerosis 10/09    No stenosis, echo, October, 2009  . Aortic insufficiency 10/09    Mild to moderate, Echo, October, 2009  . Left ventricular hypertrophy 10/09    , Mild to moderate, echo,  2009  . Obstructive sleep apnea     ??? possible  . Abnormal chest x-ray 1. 2008  2. 9/10    1. Question Abn. CXRay ?? L2  2. Spine films, [place] no osteolytic lesions seen  . Ejection fraction      EF 60-65%,  echo, October, 2009    History reviewed. No pertinent past surgical history.  Patient Active Problem List   Diagnosis Date Noted  . Dizziness   . Aortic valve sclerosis   . Aortic insufficiency   . Left ventricular hypertrophy   . Ejection fraction   . Abnormal chest x-ray   . Hypertension   . Hyperlipidemia   . Tobacco abuse   . Chronic kidney disease   . Sinus bradycardia   . Obstructive sleep apnea   . DYSLIPIDEMIA 12/30/2008    ROS   Patient denies fever, chills, headache, sweats, rash, change in vision, change in hearing, chest pain, cough, nausea vomiting, urinary symptoms. All other systems are reviewed and are negative.  PHYSICAL EXAM  Patient is oriented to person time and place. Affect is normal. There is no jugulovenous distention. He is overweight. Lungs are clear. Respiratory effort is nonlabored. Cardiac exam reveals S1 and S2. There is a crescendo decrescendo systolic murmur area the abdomen is soft. There is no peripheral edema. There are no musculoskeletal deformities. There are no skin  rashes.  Filed Vitals:   08/29/13 0836  BP: 143/82  Pulse: 52  Height: 5\' 2"  (1.575 m)  Weight: 180 lb 1.9 oz (81.702 kg)  SpO2: 94%   EKG is done today and reviewed by me. There is old sinus bradycardia. The QRS is normal.  There is no significant change from the past.  ASSESSMENT & PLAN

## 2013-08-29 NOTE — Assessment & Plan Note (Signed)
His last echo was 2009. He had some aortic valve sclerosis and mild aortic insufficiency. He now has some shortness of breath. We will proceed with a followup 2-D echo to reassess LV function and assess his valvular function.

## 2013-08-29 NOTE — Assessment & Plan Note (Signed)
Patient is on a statin and he is being followed by his primary physician.

## 2013-08-29 NOTE — Assessment & Plan Note (Signed)
He continues to have mild sinus bradycardia. In the past a Holter monitor showed a rare wide-complex junctional escape rhythm. At this time it is possible that his exertional symptoms could be chronotropic incompetence. I will consider a Holter monitor when I see him back.

## 2013-08-29 NOTE — Assessment & Plan Note (Signed)
When I saw him in 2013 he was still smoking a small amount. He has completely stopped since then. I have applauded him for this.

## 2013-08-29 NOTE — Assessment & Plan Note (Signed)
He is not having any significant dizziness at this time.

## 2013-08-29 NOTE — Assessment & Plan Note (Signed)
At this point the etiology of his intermittent shortness of breath is not clear. He has is when he walks to the mailbox. He does not have it when he walks a mile in the morning. He has a different symptom when he walks in the morning. He has a need to belch. When this is done he feels fine. At this point there is no definite proof that this represents ischemia. I've chosen to start by reassessing LV function and valvular function with an echo. I will then see him back and consider other workup.  As part of today's evaluation I spent greater than 25 minutes with is total care. More than half of this has been with talking with him about his symptoms and updating his overall history since 2013.

## 2013-09-11 DIAGNOSIS — IMO0001 Reserved for inherently not codable concepts without codable children: Secondary | ICD-10-CM | POA: Diagnosis not present

## 2013-09-11 DIAGNOSIS — Z0000 Encounter for general adult medical examination without abnormal findings: Secondary | ICD-10-CM | POA: Diagnosis not present

## 2013-09-17 ENCOUNTER — Other Ambulatory Visit (INDEPENDENT_AMBULATORY_CARE_PROVIDER_SITE_OTHER): Payer: Medicare Other

## 2013-09-17 ENCOUNTER — Other Ambulatory Visit: Payer: Self-pay

## 2013-09-17 DIAGNOSIS — I059 Rheumatic mitral valve disease, unspecified: Secondary | ICD-10-CM

## 2013-09-17 DIAGNOSIS — R0602 Shortness of breath: Secondary | ICD-10-CM

## 2013-09-17 DIAGNOSIS — I359 Nonrheumatic aortic valve disorder, unspecified: Secondary | ICD-10-CM

## 2013-09-17 DIAGNOSIS — I358 Other nonrheumatic aortic valve disorders: Secondary | ICD-10-CM

## 2013-09-19 ENCOUNTER — Encounter: Payer: Self-pay | Admitting: Cardiology

## 2013-09-19 DIAGNOSIS — I340 Nonrheumatic mitral (valve) insufficiency: Secondary | ICD-10-CM | POA: Insufficient documentation

## 2013-09-22 ENCOUNTER — Telehealth: Payer: Self-pay | Admitting: *Deleted

## 2013-09-22 NOTE — Telephone Encounter (Signed)
Patient informed via vm. 

## 2013-09-22 NOTE — Telephone Encounter (Signed)
Message copied by Eustace MooreANDERSON, LYDIA M on Mon Sep 22, 2013  9:51 AM ------      Message from: Myrtis SerKATZ, UtahJEFFREY D      Created: Fri Sep 19, 2013  7:44 AM       Please let the patient know that his echo test looks good. Muscle function of his heart is good. His valve function is stable. ------

## 2013-10-08 ENCOUNTER — Ambulatory Visit (INDEPENDENT_AMBULATORY_CARE_PROVIDER_SITE_OTHER): Payer: Medicare Other | Admitting: Cardiology

## 2013-10-08 ENCOUNTER — Encounter: Payer: Self-pay | Admitting: Cardiology

## 2013-10-08 VITALS — BP 131/74 | HR 52 | Ht 62.0 in | Wt 180.0 lb

## 2013-10-08 DIAGNOSIS — I359 Nonrheumatic aortic valve disorder, unspecified: Secondary | ICD-10-CM | POA: Diagnosis not present

## 2013-10-08 DIAGNOSIS — I10 Essential (primary) hypertension: Secondary | ICD-10-CM | POA: Diagnosis not present

## 2013-10-08 DIAGNOSIS — I351 Nonrheumatic aortic (valve) insufficiency: Secondary | ICD-10-CM

## 2013-10-08 DIAGNOSIS — R0602 Shortness of breath: Secondary | ICD-10-CM

## 2013-10-08 DIAGNOSIS — R42 Dizziness and giddiness: Secondary | ICD-10-CM

## 2013-10-08 NOTE — Patient Instructions (Signed)

## 2013-10-08 NOTE — Assessment & Plan Note (Signed)
He has intermittent shortness of breath it is quite mild. It is not limiting him. It is not changing. I had raised the question of whether he could have chronotropic incompetence. At this point I decided not to proceed with any other testing. I will see him back for followup over time.

## 2013-10-08 NOTE — Assessment & Plan Note (Signed)
He is not having any recurrent dizziness.

## 2013-10-08 NOTE — Assessment & Plan Note (Signed)
He has moderate aortic insufficiency by echo April, 2015. This can be followed over time.

## 2013-10-08 NOTE — Progress Notes (Signed)
Patient ID: Allen PuttSamuel J Flanagan, male   DOB: 12/11/1933, 80 y.o.   MRN: [id number]    HPI  Patient is seen today to followup mild valvular heart disease. I saw him August 29, 2013. He describes some intermittent shortness of breath. We did proceed with a followup 2-D echo. It shows that his LV function is excellent. He does have moderate aortic regurgitation. There is no significant stenosis. There is mild mitral regurgitation. Overall this data is quite stable. Today he returns for followup. We discussed the results. He still has mild intermittent shortness of breath, but this is stable. It is not limiting.  No Known Allergies  Current Outpatient Prescriptions  Medication Sig Dispense Refill  . aspirin 81 MG tablet Take 81 mg by mouth daily.        Marland Kitchen. atorvastatin (LIPITOR) 80 MG tablet Take 80 mg by mouth daily.      Marland Kitchen. glimepiride (AMARYL) 2 MG tablet Take 2 mg by mouth 2 (two) times daily.      Marland Kitchen. LANTUS SOLOSTAR 100 UNIT/ML Solostar Pen Inject 50 Units into the skin at bedtime.      Marland Kitchen. lisinopril-hydrochlorothiazide (PRINZIDE,ZESTORETIC) 20-12.5 MG per tablet Take 1 tablet by mouth daily.        . metFORMIN (GLUCOPHAGE) 500 MG tablet Take 500 mg by mouth 2 (two) times daily.         No current facility-administered medications for this visit.    History   Social History  . Marital Status: Married    Spouse Name: N/A    Number of Children: N/A  . Years of Education: N/A   Occupational History  . [employer]     Retired   Social History Main Topics  . Smoking status: Former Smoker    Quit date: 06/13/2011  . Smokeless tobacco: Never Used  . Alcohol Use: Not on file  . Drug Use: Not on file  . Sexual Activity: Not on file   Other Topics Concern  . Not on file   Social History Narrative  . No narrative on file    Family History  Problem Relation Age of Onset  . Coronary artery disease Neg Hx     No strong family history    Past Medical History  Diagnosis Date  . Hypertension    . Hyperlipidemia   . Tobacco abuse   . Chronic kidney disease     Creatinine 1.4  . Dizziness 10/09    [place]; bradycardia noted but dizziness probably vertigo  . Sinus bradycardia 11/09    Holter monitor; rare wide complex junctional escape rate at 60  . Aortic valve sclerosis 10/09    No stenosis, echo, October, 2009  . Aortic insufficiency 10/09    Mild to moderate, Echo, October, 2009  . Left ventricular hypertrophy 10/09    , Mild to moderate, echo,  2009  . Obstructive sleep apnea     ??? possible  . Abnormal chest x-ray 1. 2008  2. 9/10    1. Question Abn. CXRay ?? L2  2. Spine films, [place] no osteolytic lesions seen  . Ejection fraction      EF 60-65%, echo, October, 2009    No past surgical history on file.  Patient Active Problem List   Diagnosis Date Noted  . Mitral regurgitation 09/19/2013  . Shortness of breath 08/29/2013  . Dizziness   . Aortic valve sclerosis   . Aortic insufficiency   . Left ventricular hypertrophy   . Ejection  fraction   . Abnormal chest x-ray   . Hypertension   . Hyperlipidemia   . Tobacco abuse   . Chronic kidney disease   . Sinus bradycardia   . Obstructive sleep apnea   . DYSLIPIDEMIA 12/30/2008    ROS   Patient denies fever, chills, headache, sweats, rash, change in vision, change in hearing, chest pain, cough, nausea vomiting, urinary symptoms. All other systems are reviewed and are negative.  PHYSICAL EXAM  Patient is oriented to person time and place. Affect is normal. He is quite stable. Head is atraumatic. Sclera and conjunctiva are normal. There is no jugulovenous distention. Lungs are clear. Respiratory effort is nonlabored. Cardiac exam reveals an S1 and S2. I do not hear his aortic insufficiency. The abdomen is soft. There is no peripheral edema. There no musculoskeletal deformities. There are no skin rashes.  Filed Vitals:   10/08/13 0802  BP: 131/74  Pulse: 52  Height: 5\' 2"  (1.575 m)  Weight: 180 lb (81.647 kg)   SpO2: 96%    ASSESSMENT & PLAN

## 2013-10-08 NOTE — Assessment & Plan Note (Signed)
Blood pressure is well controlled. No change in therapy. 

## 2013-12-18 DIAGNOSIS — E782 Mixed hyperlipidemia: Secondary | ICD-10-CM | POA: Diagnosis not present

## 2013-12-18 DIAGNOSIS — IMO0001 Reserved for inherently not codable concepts without codable children: Secondary | ICD-10-CM | POA: Diagnosis not present

## 2013-12-18 DIAGNOSIS — I10 Essential (primary) hypertension: Secondary | ICD-10-CM | POA: Diagnosis not present

## 2013-12-18 DIAGNOSIS — Z125 Encounter for screening for malignant neoplasm of prostate: Secondary | ICD-10-CM | POA: Diagnosis not present

## 2013-12-25 DIAGNOSIS — I10 Essential (primary) hypertension: Secondary | ICD-10-CM | POA: Diagnosis not present

## 2013-12-25 DIAGNOSIS — E119 Type 2 diabetes mellitus without complications: Secondary | ICD-10-CM | POA: Diagnosis not present

## 2013-12-25 DIAGNOSIS — N401 Enlarged prostate with lower urinary tract symptoms: Secondary | ICD-10-CM | POA: Diagnosis not present

## 2013-12-25 DIAGNOSIS — R972 Elevated prostate specific antigen [PSA]: Secondary | ICD-10-CM | POA: Diagnosis not present

## 2013-12-25 DIAGNOSIS — R350 Frequency of micturition: Secondary | ICD-10-CM | POA: Diagnosis not present

## 2013-12-25 DIAGNOSIS — R3915 Urgency of urination: Secondary | ICD-10-CM | POA: Diagnosis not present

## 2013-12-25 DIAGNOSIS — R351 Nocturia: Secondary | ICD-10-CM | POA: Diagnosis not present

## 2014-03-12 DIAGNOSIS — M109 Gout, unspecified: Secondary | ICD-10-CM | POA: Diagnosis not present

## 2014-09-16 ENCOUNTER — Ambulatory Visit (INDEPENDENT_AMBULATORY_CARE_PROVIDER_SITE_OTHER): Payer: Medicare Other | Admitting: Cardiology

## 2014-09-16 ENCOUNTER — Encounter: Payer: Self-pay | Admitting: Cardiology

## 2014-09-16 DIAGNOSIS — I517 Cardiomegaly: Secondary | ICD-10-CM

## 2014-09-17 NOTE — Progress Notes (Signed)
Patient did not come for visit.

## 2014-10-12 ENCOUNTER — Ambulatory Visit: Payer: Medicare Other | Admitting: Cardiology

## 2014-10-23 ENCOUNTER — Encounter: Payer: Self-pay | Admitting: *Deleted

## 2014-10-23 ENCOUNTER — Encounter: Payer: Self-pay | Admitting: Cardiology

## 2014-10-23 ENCOUNTER — Ambulatory Visit (INDEPENDENT_AMBULATORY_CARE_PROVIDER_SITE_OTHER): Payer: Medicare PPO | Admitting: Cardiology

## 2014-10-23 VITALS — BP 166/86 | HR 57 | Ht 63.0 in | Wt 183.4 lb

## 2014-10-23 DIAGNOSIS — I447 Left bundle-branch block, unspecified: Secondary | ICD-10-CM | POA: Diagnosis not present

## 2014-10-23 DIAGNOSIS — R0602 Shortness of breath: Secondary | ICD-10-CM

## 2014-10-23 DIAGNOSIS — R072 Precordial pain: Secondary | ICD-10-CM

## 2014-10-23 DIAGNOSIS — I351 Nonrheumatic aortic (valve) insufficiency: Secondary | ICD-10-CM

## 2014-10-23 DIAGNOSIS — I10 Essential (primary) hypertension: Secondary | ICD-10-CM

## 2014-10-23 NOTE — Patient Instructions (Signed)
Your physician recommends that you continue on your current medications as directed. Please refer to the Current Medication list given to you today. Your physician has requested that you have an echocardiogram. Echocardiography is a painless test that uses sound waves to create images of your heart. It provides your doctor with information about the size and shape of your heart and how well your heart's chambers and valves are working. This procedure takes approximately one hour. There are no restrictions for this procedure. Your physician has requested that you have an adenosine myoview. For further information please visit [email or web address]. Please follow instruction sheet, as given. Your physician recommends that you schedule a follow-up appointment in: after test completed.

## 2014-10-23 NOTE — Assessment & Plan Note (Signed)
Systolic blood pressures mildly elevated today. We will follow this.

## 2014-10-23 NOTE — Assessment & Plan Note (Signed)
Historically he has moderate aortic insufficiency from April echo 2015.

## 2014-10-23 NOTE — Assessment & Plan Note (Signed)
The patient has had shortness of breath in the past. He now mentions some precordial chest discomfort with walking. Left bundle branch block noted today has not been seen on prior EKGs. He will need further assessment. Echo will be done to reassess left ventricular function. Adenosine Myoview will be done to rule out ischemia. I will see him back for early follow-up.

## 2014-10-23 NOTE — Assessment & Plan Note (Signed)
Left bundle branch block is new since the tracing one year ago. He will need a follow-up echo to reassess LV function and he'll need a stress nuclear study because he is having some chest discomfort.

## 2014-10-23 NOTE — Progress Notes (Signed)
Cardiology Office Note   Date:  10/23/2014   ID:  Allen [name], DOB 04/22/1934, MRN [id number]  PCP:  Toma DeitersHASANAJ,XAJE A, MD  Cardiologist:  Willa RoughJeffrey Katz, MD   Chief Complaint  Patient presents with  . Appointment    Follow-up aortic regurgitation      History of Present Illness: Allen [name] is a 81 y.o. male who presents today to follow-up history of mild to moderate aortic insufficiency. I saw him last April, 2015. He has moderate aortic insufficiency with normal LV function. There has been no previously documented coronary artery disease. He tells me today that with walking he does have some shortness of breath. He's had this in the past. Also he does appear to have some chest discomfort. There is no nausea vomiting or diaphoresis. There is no radiation. He has not had any rest symptoms. The patient also mentions that after he takes his walk in the morning he might have belching. This is concerning for the possibility of ischemia    Past Medical History  Diagnosis Date  . Hypertension   . Hyperlipidemia   . Tobacco abuse   . Chronic kidney disease     Creatinine 1.4  . Dizziness 10/09    [place]; bradycardia noted but dizziness probably vertigo  . Sinus bradycardia 11/09    Holter monitor; rare wide complex junctional escape rate at 60  . Aortic valve sclerosis 10/09    No stenosis, echo, October, 2009  . Aortic insufficiency 10/09    Mild to moderate, Echo, October, 2009  . Left ventricular hypertrophy 10/09    , Mild to moderate, echo,  2009  . Obstructive sleep apnea     ??? possible  . Abnormal chest x-ray 1. 2008  2. 9/10    1. Question Abn. CXRay ?? L2  2. Spine films, [place] no osteolytic lesions seen  . Ejection fraction      EF 60-65%, echo, October, 2009    History reviewed. No pertinent past surgical history.  Patient Active Problem List   Diagnosis Date Noted  . LBBB (left bundle branch block) 10/23/2014  . Mitral regurgitation 09/19/2013  .  Shortness of breath 08/29/2013  . Dizziness   . Aortic valve sclerosis   . Aortic insufficiency   . Left ventricular hypertrophy   . Ejection fraction   . Abnormal chest x-ray   . Hypertension   . Hyperlipidemia   . Tobacco abuse   . Chronic kidney disease   . Sinus bradycardia   . Obstructive sleep apnea   . DYSLIPIDEMIA 12/30/2008      Current Outpatient Prescriptions  Medication Sig Dispense Refill  . amLODipine (NORVASC) 5 MG tablet Take 5 mg by mouth daily.    Marland Kitchen. aspirin 81 MG tablet Take 81 mg by mouth daily.      Marland Kitchen. atorvastatin (LIPITOR) 80 MG tablet Take 80 mg by mouth daily.    Marland Kitchen. glimepiride (AMARYL) 2 MG tablet Take 2 mg by mouth 2 (two) times daily.    Marland Kitchen. LANTUS SOLOSTAR 100 UNIT/ML Solostar Pen Inject 50 Units into the skin at bedtime.    Marland Kitchen. lisinopril-hydrochlorothiazide (PRINZIDE,ZESTORETIC) 20-12.5 MG per tablet Take 1 tablet by mouth daily.      . metFORMIN (GLUCOPHAGE) 500 MG tablet Take 500 mg by mouth 2 (two) times daily.       No current facility-administered medications for this visit.    Allergies:   Review of patient's allergies indicates no known allergies.  Social History:  The patient  reports that he quit smoking about 3 years ago. He has never used smokeless tobacco.   Family History:  The patient's family history is negative for Coronary artery disease.    ROS:  Please see the history of present illness.    Patient denies fever, chills, headache, sweats, rash, change in vision, change in hearing, cough, nausea vomiting, urinary symptoms. All other systems are reviewed and are negative.    PHYSICAL EXAM: VS:  BP 166/86 mmHg  Pulse 57  Ht 5\' 3"  (1.6 m)  Wt 183 lb 6.4 oz (83.19 kg)  BMI 32.50 kg/m2  SpO2 96% , Patient is oriented to person time and place. Affect is normal. Head is atraumatic. Sclera and conjunctiva are normal. There is no jugular venous distention. Lungs are clear. Respiratory effort is not labored. Cardiac exam reveals S1 and  S2. There is a 2/6 systolic murmur. The abdomen is soft. There is no peripheral edema. There are no musculoskeletal deformities. There are no skin rashes.  EKG:   EKG is done today and reviewed by me. Today he has left bundle branch block. This was not present on the prior EKGs that I have reviewed.   Recent Labs: No results found for requested labs within last 365 days.    Lipid Panel No results found for: CHOL, TRIG, HDL, CHOLHDL, VLDL, LDLCALC, LDLDIRECT    Wt Readings from Last 3 Encounters:  10/23/14 183 lb 6.4 oz (83.19 kg)  10/08/13 180 lb (81.647 kg)  08/29/13 180 lb 1.9 oz (81.702 kg)      Current medicines are reviewed  The patient understands his medications.     ASSESSMENT AND PLAN:

## 2014-11-01 ENCOUNTER — Inpatient Hospital Stay (HOSPITAL_COMMUNITY)
Admission: AD | Admit: 2014-11-01 | Discharge: 2014-11-03 | DRG: 282 | Disposition: A | Discharge status: Home / Self Care | Payer: Medicare PPO | Source: Other Acute Inpatient Hospital | Attending: Internal Medicine | Admitting: Internal Medicine

## 2014-11-01 ENCOUNTER — Encounter (HOSPITAL_COMMUNITY): Payer: Self-pay | Admitting: Cardiology

## 2014-11-01 DIAGNOSIS — G4733 Obstructive sleep apnea (adult) (pediatric): Secondary | ICD-10-CM | POA: Diagnosis present

## 2014-11-01 DIAGNOSIS — Z720 Tobacco use: Secondary | ICD-10-CM | POA: Diagnosis present

## 2014-11-01 DIAGNOSIS — Z79899 Other long term (current) drug therapy: Secondary | ICD-10-CM | POA: Diagnosis not present

## 2014-11-01 DIAGNOSIS — Z6832 Body mass index (BMI) 32.0-32.9, adult: Secondary | ICD-10-CM | POA: Diagnosis not present

## 2014-11-01 DIAGNOSIS — Z8249 Family history of ischemic heart disease and other diseases of the circulatory system: Secondary | ICD-10-CM | POA: Diagnosis not present

## 2014-11-01 DIAGNOSIS — I447 Left bundle-branch block, unspecified: Secondary | ICD-10-CM | POA: Diagnosis present

## 2014-11-01 DIAGNOSIS — N189 Chronic kidney disease, unspecified: Secondary | ICD-10-CM | POA: Diagnosis present

## 2014-11-01 DIAGNOSIS — N182 Chronic kidney disease, stage 2 (mild): Secondary | ICD-10-CM | POA: Diagnosis present

## 2014-11-01 DIAGNOSIS — I214 Non-ST elevation (NSTEMI) myocardial infarction: Principal | ICD-10-CM | POA: Diagnosis present

## 2014-11-01 DIAGNOSIS — I351 Nonrheumatic aortic (valve) insufficiency: Secondary | ICD-10-CM | POA: Diagnosis present

## 2014-11-01 DIAGNOSIS — E119 Type 2 diabetes mellitus without complications: Secondary | ICD-10-CM | POA: Diagnosis present

## 2014-11-01 DIAGNOSIS — E785 Hyperlipidemia, unspecified: Secondary | ICD-10-CM | POA: Diagnosis present

## 2014-11-01 DIAGNOSIS — Z87891 Personal history of nicotine dependence: Secondary | ICD-10-CM | POA: Diagnosis not present

## 2014-11-01 DIAGNOSIS — I10 Essential (primary) hypertension: Secondary | ICD-10-CM | POA: Diagnosis present

## 2014-11-01 DIAGNOSIS — I350 Nonrheumatic aortic (valve) stenosis: Secondary | ICD-10-CM | POA: Diagnosis not present

## 2014-11-01 DIAGNOSIS — I25118 Atherosclerotic heart disease of native coronary artery with other forms of angina pectoris: Secondary | ICD-10-CM | POA: Diagnosis not present

## 2014-11-01 DIAGNOSIS — Z7982 Long term (current) use of aspirin: Secondary | ICD-10-CM | POA: Diagnosis not present

## 2014-11-01 DIAGNOSIS — R001 Bradycardia, unspecified: Secondary | ICD-10-CM | POA: Diagnosis not present

## 2014-11-01 DIAGNOSIS — I25110 Atherosclerotic heart disease of native coronary artery with unstable angina pectoris: Secondary | ICD-10-CM | POA: Diagnosis present

## 2014-11-01 DIAGNOSIS — I200 Unstable angina: Secondary | ICD-10-CM

## 2014-11-01 DIAGNOSIS — E669 Obesity, unspecified: Secondary | ICD-10-CM | POA: Diagnosis present

## 2014-11-01 DIAGNOSIS — I129 Hypertensive chronic kidney disease with stage 1 through stage 4 chronic kidney disease, or unspecified chronic kidney disease: Secondary | ICD-10-CM | POA: Diagnosis present

## 2014-11-01 LAB — TSH: TSH: 3.413 u[IU]/mL (ref 0.350–4.500)

## 2014-11-01 LAB — CBC WITH DIFFERENTIAL/PLATELET
BASOS PCT: 1 % (ref 0–1)
Basophils Absolute: 0 10*3/uL (ref 0.0–0.1)
EOS ABS: 0.1 10*3/uL (ref 0.0–0.7)
EOS PCT: 2 % (ref 0–5)
HCT: 42 % (ref 39.0–52.0)
HEMOGLOBIN: 13.7 g/dL (ref 13.0–17.0)
Lymphocytes Relative: 42 % (ref 12–46)
Lymphs Abs: 1.6 10*3/uL (ref 0.7–4.0)
MCH: 30.9 pg (ref 26.0–34.0)
MCHC: 32.6 g/dL (ref 30.0–36.0)
MCV: 94.6 fL (ref 78.0–100.0)
Monocytes Absolute: 0.5 10*3/uL (ref 0.1–1.0)
Monocytes Relative: 13 % — ABNORMAL HIGH (ref 3–12)
NEUTROS ABS: 1.6 10*3/uL — AB (ref 1.7–7.7)
Neutrophils Relative %: 42 % — ABNORMAL LOW (ref 43–77)
PLATELETS: 193 10*3/uL (ref 150–400)
RBC: 4.44 MIL/uL (ref 4.22–5.81)
RDW: 14.6 % (ref 11.5–15.5)
WBC: 3.8 10*3/uL — AB (ref 4.0–10.5)

## 2014-11-01 LAB — COMPREHENSIVE METABOLIC PANEL
ALBUMIN: 3.4 g/dL — AB (ref 3.5–5.0)
ALT: 27 U/L (ref 17–63)
ANION GAP: 8 (ref 5–15)
AST: 49 U/L — AB (ref 15–41)
Alkaline Phosphatase: 67 U/L (ref 38–126)
BUN: 17 mg/dL (ref 6–20)
CALCIUM: 10 mg/dL (ref 8.9–10.3)
CO2: 24 mmol/L (ref 22–32)
Chloride: 109 mmol/L (ref 101–111)
Creatinine, Ser: 1.29 mg/dL — ABNORMAL HIGH (ref 0.61–1.24)
GFR calc Af Amer: 60 mL/min — ABNORMAL LOW (ref >60)
GFR, EST NON AFRICAN AMERICAN: 51 mL/min — AB (ref >60)
Glucose, Bld: 102 mg/dL — ABNORMAL HIGH (ref 65–99)
Potassium: 4.1 mmol/L (ref 3.5–5.1)
Sodium: 141 mmol/L (ref 135–145)
TOTAL PROTEIN: 6.7 g/dL (ref 6.5–8.1)
Total Bilirubin: 0.9 mg/dL (ref 0.3–1.2)

## 2014-11-01 LAB — TROPONIN I: Troponin I: 0.82 ng/mL (ref <0.031)

## 2014-11-01 LAB — GLUCOSE, CAPILLARY: GLUCOSE-CAPILLARY: 106 mg/dL — AB (ref 65–99)

## 2014-11-01 MED ORDER — AMLODIPINE BESYLATE 5 MG PO TABS
5.0000 mg | ORAL_TABLET | Freq: Every day | ORAL | Status: DC
  Administered 2014-11-01 – 2014-11-02 (×2): 5 mg via ORAL
  Filled 2014-11-01 (×2): qty 1

## 2014-11-01 MED ORDER — ONDANSETRON HCL 4 MG/2ML IJ SOLN: 4.0000 mg | Freq: Four times a day (QID) | INTRAMUSCULAR | Status: DC | PRN

## 2014-11-01 MED ORDER — SODIUM CHLORIDE 0.9 % IJ SOLN: 3.0000 mL | Freq: Two times a day (BID) | INTRAMUSCULAR | Status: DC

## 2014-11-01 MED ORDER — INSULIN ASPART 100 UNIT/ML ~~LOC~~ SOLN: 0.0000 [IU] | Freq: Three times a day (TID) | SUBCUTANEOUS | Status: DC

## 2014-11-01 MED ORDER — SODIUM CHLORIDE 0.9 % IJ SOLN: 3.0000 mL | INTRAMUSCULAR | Status: DC | PRN

## 2014-11-01 MED ORDER — ASPIRIN 300 MG RE SUPP: 300.0000 mg | RECTAL | Status: DC

## 2014-11-01 MED ORDER — ASPIRIN EC 81 MG PO TBEC
81.0000 mg | DELAYED_RELEASE_TABLET | Freq: Every day | ORAL | Status: DC
  Administered 2014-11-02 – 2014-11-03 (×2): 81 mg via ORAL
  Filled 2014-11-01 (×2): qty 1

## 2014-11-01 MED ORDER — ASPIRIN 81 MG PO CHEW: 324.0000 mg | CHEWABLE_TABLET | ORAL | Status: DC

## 2014-11-01 MED ORDER — INSULIN GLARGINE 100 UNIT/ML ~~LOC~~ SOLN
50.0000 [IU] | Freq: Every day | SUBCUTANEOUS | Status: DC
  Administered 2014-11-01: 25 [IU] via SUBCUTANEOUS
  Filled 2014-11-01 (×3): qty 0.5

## 2014-11-01 MED ORDER — SIMVASTATIN 40 MG PO TABS
40.0000 mg | ORAL_TABLET | Freq: Every day | ORAL | Status: DC
  Administered 2014-11-01: 40 mg via ORAL
  Filled 2014-11-01: qty 1

## 2014-11-01 MED ORDER — SODIUM CHLORIDE 0.9 % WEIGHT BASED INFUSION
1.0000 mL/kg/h | INTRAVENOUS | Status: DC
  Administered 2014-11-02: 1 mL/kg/h via INTRAVENOUS

## 2014-11-01 MED ORDER — ASPIRIN 81 MG PO TBEC: 81.0000 mg | DELAYED_RELEASE_TABLET | Freq: Every day | ORAL | Status: DC

## 2014-11-01 MED ORDER — ASPIRIN 81 MG PO CHEW
81.0000 mg | CHEWABLE_TABLET | ORAL | Status: AC
  Administered 2014-11-02: 81 mg via ORAL
  Filled 2014-11-01: qty 1

## 2014-11-01 MED ORDER — SODIUM CHLORIDE 0.9 % IV SOLN: 250.0000 mL | INTRAVENOUS | Status: DC | PRN

## 2014-11-01 MED ORDER — ACETAMINOPHEN 325 MG PO TABS: 650.0000 mg | ORAL_TABLET | ORAL | Status: DC | PRN

## 2014-11-01 MED ORDER — GLIMEPIRIDE 1 MG PO TABS
2.0000 mg | ORAL_TABLET | Freq: Two times a day (BID) | ORAL | Status: DC
  Administered 2014-11-02 – 2014-11-03 (×2): 2 mg via ORAL
  Filled 2014-11-01 (×2): qty 2

## 2014-11-01 MED ORDER — METOPROLOL TARTRATE 25 MG PO TABS
25.0000 mg | ORAL_TABLET | Freq: Two times a day (BID) | ORAL | Status: DC
  Filled 2014-11-01 (×2): qty 1

## 2014-11-01 MED ORDER — METFORMIN HCL 500 MG PO TABS: 500.0000 mg | ORAL_TABLET | Freq: Two times a day (BID) | ORAL | Status: DC

## 2014-11-01 MED ORDER — HEPARIN (PORCINE) IN NACL 100-0.45 UNIT/ML-% IJ SOLN
850.0000 [IU]/h | INTRAMUSCULAR | Status: DC
  Administered 2014-11-01: 850 [IU]/h via INTRAVENOUS
  Filled 2014-11-01: qty 250

## 2014-11-01 MED ORDER — SODIUM CHLORIDE 0.9 % WEIGHT BASED INFUSION: 3.0000 mL/kg/h | INTRAVENOUS | Status: AC

## 2014-11-01 MED ORDER — LISINOPRIL 40 MG PO TABS
40.0000 mg | ORAL_TABLET | Freq: Every day | ORAL | Status: DC
Start: 2014-11-01 — End: 2014-11-03
  Administered 2014-11-02 – 2014-11-03 (×2): 40 mg via ORAL
  Filled 2014-11-01 (×3): qty 1

## 2014-11-01 MED ORDER — NITROGLYCERIN 0.4 MG SL SUBL: 0.4000 mg | SUBLINGUAL_TABLET | SUBLINGUAL | Status: DC | PRN

## 2014-11-01 NOTE — Progress Notes (Signed)
ANTICOAGULATION CONSULT NOTE - Initial Consult  Pharmacy Consult for Heparin  Indication: chest pain/ACS  No Known Allergies  Patient Measurements: Height: 5\' 2"  (157.5 cm) Weight: 180 lb 11.2 oz (81.965 kg) IBW/kg (Calculated) : 54.6 Heparin Dosing Weight: 72.5  Vital Signs: Temp: 97.8 F (36.6 C) (05/22 2044) Temp Source: Oral (05/22 1854) BP: 161/85 mmHg (05/22 2044) Pulse Rate: 56 (05/22 2044)  Labs: No results for input(s): HGB, HCT, PLT, APTT, LABPROT, INR, HEPARINUNFRC, CREATININE, CKTOTAL, CKMB, TROPONINI in the last 72 hours.  CrCl cannot be calculated (Patient has no serum creatinine result on file.).   Medical History: Past Medical History  Diagnosis Date  . Hypertension   . Hyperlipidemia   . Tobacco abuse   . Chronic kidney disease     Creatinine 1.4  . Dizziness 10/09    [place]; bradycardia noted but dizziness probably vertigo  . Sinus bradycardia 11/09    Holter monitor; rare wide complex junctional escape rate at 60  . Aortic valve sclerosis 10/09    No stenosis, echo, October, 2009  . Aortic insufficiency 10/09    Mild to moderate, Echo, October, 2009  . Left ventricular hypertrophy 10/09    , Mild to moderate, echo,  2009  . Obstructive sleep apnea     ??? possible  . Abnormal chest x-ray 1. 2008  2. 9/10    1. Question Abn. CXRay ?? L2  2. Spine films, [place] no osteolytic lesions seen  . Ejection fraction      EF 60-65%, echo, October, 2009    Assessment: [age] [facility], [facility] trop[facility]. Pharmacy is consulted to start IV heparin. Likely cath soon. Pt. Received lovenox 80 mg at 1000 prior to transfer.   Goal of Therapy:  Heparin level 0.3-0.7 units/ml Monitor platelets by anticoagulation protocol: Yes   Plan:  - Heparin infusion with no bolus 850 units/hr - f/u 8 hr heparin level at 0600 - f/u plans for cath - daily heparin level and CBC   Bayard HuggerMei Keyaira Clapham, PharmD, BCPS  Clinical Pharmacist  Pager:  [phone]   11/01/2014,9:31 PM

## 2014-11-01 NOTE — Progress Notes (Signed)
Per Dr Sheran Luzilley--pt to receive half dose of scheduled Lantus due to NPO for cath tomorrow.  Pt to get 25u Lantus SQ for tonight only. Dierdre HighmanHall, Harlei Lehrmann Marie, RN

## 2014-11-01 NOTE — H&P (Signed)
History and Physical   Admit date: 11/01/2014 Name:  Allen PuttSamuel J Mcavoy Medical record number: [id number] DOB/Age:  08/05/1933  81 y.o. male  Primary Cardiologist:  Dr. Myrtis SerKatz  Primary Physician:  Dr. Damita LackXasanaj  Chief complaint/reason for admission: Non-STEMI   HPI:  This 81 year old black male is transferred [facility] for evaluation of recent onset of chest pain and elevation of troponin.  The patient has a history of diabetes, hypertension, hyperlipidemia and obesity.  He has a family history of cardiac disease.  He has a history of aortic valve disease and mild chronic kidney disease.  He was seen recently by Dr. Myrtis SerKatz and had new onset of left bundle branch block as well as some vague chest pain was due to have a stress test.  He describes chest discomfort occurring with exertion over the past month.  He was working on [employer] and had midsternal pressure and heaviness that started on May 20 and was brought into the hospital.  [facility] he had troponin elevations to 0.17 and 0.22.  He was transferred here for further evaluation and potential catheterization.  He is currently pain-free.    Past Medical History  Diagnosis Date  . Hypertension   . Hyperlipidemia   . Tobacco abuse   . Chronic kidney disease     Creatinine 1.4  . Dizziness 10/09    [place]; bradycardia noted but dizziness probably vertigo  . Sinus bradycardia 11/09    Holter monitor; rare wide complex junctional escape rate at 60  . Aortic valve sclerosis 10/09    No stenosis, echo, October, 2009  . Aortic insufficiency 10/09    Mild to moderate, Echo, October, 2009  . Left ventricular hypertrophy 10/09    , Mild to moderate, echo,  2009  . Obstructive sleep apnea     ??? possible  . Abnormal chest x-ray 1. 2008  2. 9/10    1. Question Abn. CXRay ?? L2  2. Spine films, OK no osteolytic lesions seen  . Ejection fraction      EF 60-65%, echo, October, 2009     No past surgical history on  file..  Allergies: has No Known Allergies.   Medications: Prior to Admission medications   Medication Sig Start Date End Date Taking? Authorizing Provider  meloxicam (MOBIC) 7.5 MG tablet Take 7.5 mg by mouth daily. Take with food. 05/15/11  Yes Historical Provider, MD  amLODipine (NORVASC) 5 MG tablet Take 5 mg by mouth daily.    Historical Provider, MD  aspirin 81 MG EC tablet Take 81 mg by mouth daily.    Historical Provider, MD  glimepiride (AMARYL) 2 MG tablet Take 2 mg by mouth 2 (two) times daily. 06/10/13   Historical Provider, MD  LANTUS SOLOSTAR 100 UNIT/ML Solostar Pen Inject 50 Units into the skin at bedtime. 08/06/13   Historical Provider, MD  lisinopril (PRINIVIL,ZESTRIL) 40 MG tablet Take 40 mg by mouth daily. 09/21/14   Historical Provider, MD  metFORMIN (GLUCOPHAGE) 500 MG tablet Take 500 mg by mouth 2 (two) times daily.      Historical Provider, MD  simvastatin (ZOCOR) 40 MG tablet Take 40 mg by mouth daily at 6 PM.     Historical Provider, MD   Family History:  Father died of unknown disease, mother died of old age, brother died of MI at age 79.  Sr. has diabetes  Social History:   reports that he quit smoking about 3 years ago. He has never  used smokeless tobacco.   History   Social History Narrative     Review of Systems:  Has significant nocturia.  He does have occasional indigestion.  He has a history of chronic kidney disease.  Quit smoking a few years ago. Other than as noted above, the remainder of the review of systems is normal  Physical Exam: BP 166/83 mmHg  Pulse 54  Temp(Src) 98.5 F (36.9 C) (Oral)  Resp 20  Ht  (1.575 m)  Wt 81.965 kg (180 lb 11.2 oz)  BMI 33.04 kg/m2  SpO2 100% Pleasant black male mildly obese in no acute distress Head: Normocephalic, without obvious abnormality, atraumatic, Balding male hair pattern Eyes: conjunctivae/corneas clear. PERRL, EOM's intact. Fundi not examined Neck: no adenopathy, no carotid bruit, no JVD and  supple, symmetrical, trachea midline Lungs: clear to auscultation bilaterally Heart: Regular rate and rhythm, normal S1 and S2, no S3, 1 to 2/6 murmur across the aortic valve, 1/6 diastolic murmur Abdomen: soft, non-tender; bowel sounds normal; no masses,  no organomegaly Rectal: deferred Extremities: extremities normal, atraumatic, no cyanosis or edema Pulses: 2+ and symmetric femoral,l, pedal pulses are diminished Skin: Skin color, texture, turgor normal. No rashes or lesions Neurologic: Grossly normal   Labs: Reviewed from [facility], BUN 14 creatinine 1.07, hemoglobin 14.3, hematocrit 45.1, troponin 0.17 and 0.22  EKG: Sinus with left bundle branch block  Radiology: Mild cardiomegaly, basilar atelectasis   IMPRESSIONS: 1.  Non-STEMI 2.  Left bundle branch block 3.  Unstable angina 4.  Hypertensive heart disease 5.  Diabetes mellitus without complications 6.  Hyperlipidemia under treatment 7.  Aortic valve disease with mild-to-moderate aortic regurgitation  PLAN: Intravenous heparin.  Plan cardiac catheterization.  Cardiac catheterization procedure was discussed with the patient fully including risks of myocardial infarction, death, stroke, bleeding, arrhythmia, dye allergy, or renal insufficiency. The patient understands and is willing to proceed.  Possibility of intervention also discussed with patient including risks and he is agreeable to proceed.   Signed: Darden Palmer MD [facility] Card[facility]  11/01/2014, 8:17 PM

## 2014-11-02 ENCOUNTER — Encounter (HOSPITAL_COMMUNITY): Payer: Self-pay

## 2014-11-02 ENCOUNTER — Inpatient Hospital Stay (HOSPITAL_COMMUNITY): Payer: Medicare PPO

## 2014-11-02 ENCOUNTER — Encounter (HOSPITAL_COMMUNITY)
Admission: AD | Disposition: A | Discharge status: Home / Self Care | Payer: Medicare PPO | Source: Other Acute Inpatient Hospital | Attending: Internal Medicine

## 2014-11-02 DIAGNOSIS — I25118 Atherosclerotic heart disease of native coronary artery with other forms of angina pectoris: Secondary | ICD-10-CM

## 2014-11-02 DIAGNOSIS — I350 Nonrheumatic aortic (valve) stenosis: Secondary | ICD-10-CM

## 2014-11-02 DIAGNOSIS — R001 Bradycardia, unspecified: Secondary | ICD-10-CM

## 2014-11-02 DIAGNOSIS — N189 Chronic kidney disease, unspecified: Secondary | ICD-10-CM

## 2014-11-02 HISTORY — PX: CARDIAC CATHETERIZATION: SHX172

## 2014-11-02 LAB — CBC
HCT: 43.7 % (ref 39.0–52.0)
Hemoglobin: 14.2 g/dL (ref 13.0–17.0)
MCH: 31 pg (ref 26.0–34.0)
MCHC: 32.5 g/dL (ref 30.0–36.0)
MCV: 95.4 fL (ref 78.0–100.0)
PLATELETS: 219 10*3/uL (ref 150–400)
RBC: 4.58 MIL/uL (ref 4.22–5.81)
RDW: 14.7 % (ref 11.5–15.5)
WBC: 5 10*3/uL (ref 4.0–10.5)

## 2014-11-02 LAB — GLUCOSE, CAPILLARY
GLUCOSE-CAPILLARY: 93 mg/dL (ref 65–99)
GLUCOSE-CAPILLARY: 96 mg/dL (ref 65–99)
Glucose-Capillary: 102 mg/dL — ABNORMAL HIGH (ref 65–99)
Glucose-Capillary: 106 mg/dL — ABNORMAL HIGH (ref 65–99)
Glucose-Capillary: 42 mg/dL — CL (ref 65–99)
Glucose-Capillary: 94 mg/dL (ref 65–99)

## 2014-11-02 LAB — HEPARIN LEVEL (UNFRACTIONATED): Heparin Unfractionated: 0.41 IU/mL (ref 0.30–0.70)

## 2014-11-02 LAB — LIPID PANEL
CHOL/HDL RATIO: 3.8 ratio
Cholesterol: 170 mg/dL (ref 0–200)
HDL: 45 mg/dL (ref >40)
LDL CALC: 101 mg/dL — AB (ref 0–99)
TRIGLYCERIDES: 121 mg/dL (ref <150)
VLDL: 24 mg/dL (ref 0–40)

## 2014-11-02 LAB — TROPONIN I
Troponin I: 0.77 ng/mL (ref <0.031)
Troponin I: 0.77 ng/mL (ref <0.031)

## 2014-11-02 LAB — PROTIME-INR
INR: 1.02 (ref 0.00–1.49)
PROTHROMBIN TIME: 13.6 s (ref 11.6–15.2)

## 2014-11-02 SURGERY — LEFT HEART CATH AND CORONARY ANGIOGRAPHY
Anesthesia: LOCAL

## 2014-11-02 MED ORDER — NITROGLYCERIN 1 MG/10 ML FOR IR/CATH LAB
INTRA_ARTERIAL | Status: AC
  Filled 2014-11-02: qty 10

## 2014-11-02 MED ORDER — HEPARIN SODIUM (PORCINE) 1000 UNIT/ML IJ SOLN
INTRAMUSCULAR | Status: AC
  Filled 2014-11-02: qty 1

## 2014-11-02 MED ORDER — MIDAZOLAM HCL 2 MG/2ML IJ SOLN
INTRAMUSCULAR | Status: AC
  Filled 2014-11-02: qty 2

## 2014-11-02 MED ORDER — VERAPAMIL HCL 2.5 MG/ML IV SOLN
INTRAVENOUS | Status: AC
  Filled 2014-11-02: qty 2

## 2014-11-02 MED ORDER — SODIUM CHLORIDE 0.9 % IV SOLN: 250.0000 mL | INTRAVENOUS | Status: DC | PRN

## 2014-11-02 MED ORDER — LIDOCAINE HCL (PF) 1 % IJ SOLN
INTRAMUSCULAR | Status: AC
  Filled 2014-11-02: qty 30

## 2014-11-02 MED ORDER — VERAPAMIL HCL 2.5 MG/ML IV SOLN
INTRAVENOUS | Status: DC | PRN
  Administered 2014-11-02: 15:00:00 via INTRA_ARTERIAL

## 2014-11-02 MED ORDER — IOHEXOL 350 MG/ML SOLN
INTRAVENOUS | Status: DC | PRN
  Administered 2014-11-02: 135 mL via INTRA_ARTERIAL

## 2014-11-02 MED ORDER — ENOXAPARIN SODIUM 80 MG/0.8ML ~~LOC~~ SOLN
1.0000 mg/kg | Freq: Two times a day (BID) | SUBCUTANEOUS | Status: DC
  Administered 2014-11-03: 80 mg via SUBCUTANEOUS
  Filled 2014-11-02: qty 0.8

## 2014-11-02 MED ORDER — HEPARIN SODIUM (PORCINE) 1000 UNIT/ML IJ SOLN
INTRAMUSCULAR | Status: DC | PRN
  Administered 2014-11-02: 4000 [IU] via INTRAVENOUS

## 2014-11-02 MED ORDER — MIDAZOLAM HCL 2 MG/2ML IJ SOLN
INTRAMUSCULAR | Status: DC | PRN
  Administered 2014-11-02: 1 mg via INTRAVENOUS

## 2014-11-02 MED ORDER — AMLODIPINE BESYLATE 10 MG PO TABS
10.0000 mg | ORAL_TABLET | Freq: Every day | ORAL | Status: DC
  Filled 2014-11-02: qty 1

## 2014-11-02 MED ORDER — FENTANYL CITRATE (PF) 100 MCG/2ML IJ SOLN
INTRAMUSCULAR | Status: AC
  Filled 2014-11-02: qty 2

## 2014-11-02 MED ORDER — HEPARIN (PORCINE) IN NACL 2-0.9 UNIT/ML-% IJ SOLN
INTRAMUSCULAR | Status: AC
  Filled 2014-11-02: qty 1000

## 2014-11-02 MED ORDER — ACETAMINOPHEN 325 MG PO TABS
650.0000 mg | ORAL_TABLET | ORAL | Status: DC | PRN
  Administered 2014-11-02: 650 mg via ORAL
  Filled 2014-11-02: qty 2

## 2014-11-02 MED ORDER — OXYCODONE-ACETAMINOPHEN 5-325 MG PO TABS: 1.0000 | ORAL_TABLET | ORAL | Status: DC | PRN

## 2014-11-02 MED ORDER — METOPROLOL TARTRATE 12.5 MG HALF TABLET
12.5000 mg | ORAL_TABLET | Freq: Two times a day (BID) | ORAL | Status: DC
  Filled 2014-11-02: qty 1

## 2014-11-02 MED ORDER — CLOPIDOGREL BISULFATE 75 MG PO TABS
75.0000 mg | ORAL_TABLET | Freq: Every day | ORAL | Status: DC
  Administered 2014-11-03: 75 mg via ORAL
  Filled 2014-11-02: qty 1

## 2014-11-02 MED ORDER — LIDOCAINE HCL (PF) 1 % IJ SOLN
INTRAMUSCULAR | Status: DC | PRN
  Administered 2014-11-02: 5 mL via INTRADERMAL

## 2014-11-02 MED ORDER — SODIUM CHLORIDE 0.9 % IJ SOLN: 3.0000 mL | INTRAMUSCULAR | Status: DC | PRN

## 2014-11-02 MED ORDER — ISOSORBIDE MONONITRATE ER 60 MG PO TB24
60.0000 mg | ORAL_TABLET | Freq: Every day | ORAL | Status: DC
  Administered 2014-11-02 – 2014-11-03 (×2): 60 mg via ORAL
  Filled 2014-11-02 (×2): qty 1

## 2014-11-02 MED ORDER — SODIUM CHLORIDE 0.9 % IJ SOLN: 3.0000 mL | Freq: Two times a day (BID) | INTRAMUSCULAR | Status: DC

## 2014-11-02 MED ORDER — ATORVASTATIN CALCIUM 80 MG PO TABS
80.0000 mg | ORAL_TABLET | Freq: Every day | ORAL | Status: DC
  Administered 2014-11-02: 80 mg via ORAL
  Filled 2014-11-02: qty 1

## 2014-11-02 MED ORDER — SODIUM CHLORIDE 0.9 % WEIGHT BASED INFUSION
1.0000 mL/kg/h | INTRAVENOUS | Status: AC
  Administered 2014-11-02: 1 mL/kg/h via INTRAVENOUS

## 2014-11-02 MED ORDER — FENTANYL CITRATE (PF) 100 MCG/2ML IJ SOLN
INTRAMUSCULAR | Status: DC | PRN
  Administered 2014-11-02: 50 ug via INTRAVENOUS

## 2014-11-02 SURGICAL SUPPLY — 14 items
CATH INFINITI 5 FR JL3.5 (CATHETERS) ×2 IMPLANT
CATH INFINITI 5FR ANG PIGTAIL (CATHETERS) ×2 IMPLANT
CATH INFINITI JR4 5F (CATHETERS) ×2 IMPLANT
CATH LAUNCHER 5F EBU3.5 (CATHETERS) ×2 IMPLANT
CATH SITESEER 5F MULTI A 2 (CATHETERS) IMPLANT
DEVICE RAD COMP TR BAND LRG (VASCULAR PRODUCTS) ×2 IMPLANT
GLIDESHEATH SLEND A-KIT 6F 22G (SHEATH) ×2 IMPLANT
KIT HEART LEFT (KITS) ×2 IMPLANT
PACK CARDIAC CATHETERIZATION (CUSTOM PROCEDURE TRAY) ×2 IMPLANT
SHEATH PINNACLE 5F 10CM (SHEATH) IMPLANT
TRANSDUCER W/STOPCOCK (MISCELLANEOUS) ×2 IMPLANT
TUBING CIL FLEX 10 FLL-RA (TUBING) ×2 IMPLANT
WIRE EMERALD 3MM-J .035X150CM (WIRE) IMPLANT
WIRE SAFE-T 1.5MM-J .035X260CM (WIRE) ×2 IMPLANT

## 2014-11-02 NOTE — H&P (View-Only) (Signed)
Subjective: No CP since Saturday  Objective: Vital signs in last 24 hours: Temp:  [97.8 F (36.6 C)-98.5 F (36.9 C)] 98.4 F (36.9 C) (05/23 0515) Pulse Rate:  [52-56] 52 (05/23 0515) Resp:  [18-20] 20 (05/23 0515) BP: (141-166)/(73-85) 141/73 mmHg (05/23 0515) SpO2:  [100 %] 100 % (05/23 0515) Weight:  [176 lb 14.4 oz (80.241 kg)-180 lb 11.2 oz (81.965 kg)] 176 lb 14.4 oz (80.241 kg) (05/23 0515) Last BM Date: 11/01/14  Intake/Output from previous day: 05/22 0701 - 05/23 0700 In: -  Out: 200 [Urine:200] Intake/Output this shift:    Medications Scheduled Meds: . amLODipine  5 mg Oral Daily  . aspirin  324 mg Oral NOW   Or  . aspirin  300 mg Rectal NOW  . aspirin EC  81 mg Oral Daily  . glimepiride  2 mg Oral BID WC  . insulin aspart  0-15 Units Subcutaneous TID WC  . insulin glargine  50 Units Subcutaneous QHS  . lisinopril  40 mg Oral Daily  . metFORMIN  500 mg Oral BID WC  . metoprolol tartrate  25 mg Oral BID  . simvastatin  40 mg Oral q1800  . sodium chloride  3 mL Intravenous Q12H   Continuous Infusions: . sodium chloride 1 mL/kg/hr (11/02/14 0730)  . heparin 850 Units/hr (11/01/14 2235)   PRN Meds:.sodium chloride, acetaminophen, nitroGLYCERIN, ondansetron (ZOFRAN) IV, sodium chloride  PE: General appearance: alert, cooperative and no distress Lungs: clear to auscultation bilaterally Heart: regular rate and rhythm and 2/6 sys MM Extremities: No LEE Pulses: 2+ and symmetric Skin: Warm and dry Neurologic: Grossly normal  Lab Results:   Recent Labs  11/01/14 2118  WBC 3.8*  HGB 13.7  HCT 42.0  PLT 193   BMET  Recent Labs  11/01/14 2118  NA 141  K 4.1  CL 109  CO2 24  GLUCOSE 102*  BUN 17  CREATININE 1.29*  CALCIUM 10.0   PT/INR No results for input(s): LABPROT, INR in the last 72 hours. Cholesterol  Recent Labs  11/02/14 0213  CHOL 170   Lipid Panel     Component Value Date/Time   CHOL 170 11/02/2014 0213   TRIG 121  11/02/2014 0213   HDL 45 11/02/2014 0213   CHOLHDL 3.8 11/02/2014 0213   VLDL 24 11/02/2014 0213   LDLCALC 101* 11/02/2014 0213   Cardiac Panel (last 3 results)  Recent Labs  11/01/14 2118 11/02/14 0213  TROPONINI 0.82* 0.77*     Assessment/Plan 81 year old black male is transferred from [facility] hospital for evaluation of recent onset of chest pain and elevation of troponin. The patient has a history of diabetes, hypertension, hyperlipidemia and obesity. He has a family history of cardiac disease. He has a history of aortic valve disease and mild chronic kidney disease. He was seen recently by Dr. Myrtis Ser and had new onset of left bundle branch block as well as some vague chest pain was due to have a stress test.  Principal Problem:   NSTEMI (non-ST elevated myocardial infarction) Active Problems:   Unstable angina   Hypertension   Hyperlipidemia   Tobacco abuse   Chronic kidney disease   Obstructive sleep apnea   LBBB (left bundle branch block)  Plan:  Left heart cath today around 1000hrs.  He is on heparin, ASA, metoprolol. Zocor, lisinopril 40, amlodipine 5..   Stop metformin until a few days after cath.  Will need post cath hydration for CKD.  Monitor BP.  Will  probably need to increase amlodipine.   LOS: 1 day    HAGER, BRYAN PA-C 11/02/2014 7:47 AM As above, patient seen and examined. Patient denies chest pain or dyspnea. He has ruled in for a non-ST elevation myocardial infarction. Plan to continue aspirin, heparin, statin and metoprolol (decreased to 12.5 mg twice a day as he is having bradycardia). Proceed with cardiac catheterization. The risks and benefits were discussed and he agrees to proceed. Await echocardiogram to reassess aortic insufficiency. Increase amlodipine to 10 mg daily for blood pressure. Hold Glucophage for 48 hours following procedure. Follow renal function after procedure given baseline stage III kidney disease. Olga MillersBrian Crenshaw

## 2014-11-02 NOTE — Progress Notes (Signed)
  Echocardiogram 2D Echocardiogram has been performed.  Arvil ChacoFoster, Sabella Traore 11/02/2014, 9:48 AM

## 2014-11-02 NOTE — Interval H&P Note (Signed)
Cath Lab Visit (complete for each Cath Lab visit)  Clinical Evaluation Leading to the Procedure:   ACS: Yes.    Non-ACS:    Anginal Classification: CCS [name]  Anti-ischemic medical therapy: Minimal Therapy (1 class of medications)  Non-Invasive Test Results: No non-invasive testing performed  Prior CABG: No previous CABG      History and Physical Interval Note:  11/02/2014 2:54 PM  Allen [name]  has presented today for surgery, with the diagnosis of unstable angina  The various methods of treatment have been discussed with the patient and family. After consideration of risks, benefits and other options for treatment, the patient has consented to  Procedure(s): Left Heart Cath and Coronary Angiography (N/A) as a surgical intervention .  The patient's history has been reviewed, patient examined, no change in status, stable for surgery.  I have reviewed the patient's chart and labs.  Questions were answered to the patient's satisfaction.     Allen [name],Koven Belinsky W

## 2014-11-02 NOTE — Progress Notes (Signed)
ANTICOAGULATION CONSULT NOTE - Follow up Pharmacy Consult for Heparin  Indication: chest pain/ACS  No Known Allergies  Patient Measurements: Height: 5\' 2"  (157.5 cm) Weight: 176 lb 14.4 oz (80.241 kg) IBW/kg (Calculated) : 54.6 Heparin Dosing Weight: 72.5  Vital Signs: Temp: 98.4 F (36.9 C) (05/23 0515) Temp Source: Oral (05/23 0515) BP: 141/73 mmHg (05/23 0515) Pulse Rate: 52 (05/23 0515)  Labs:  Recent Labs  11/01/14 2118 11/02/14 0213 11/02/14 0655  HGB 13.7  --   --   HCT 42.0  --   --   PLT 193  --   --   HEPARINUNFRC  --   --  0.41  CREATININE 1.29*  --   --   TROPONINI 0.82* 0.77*  --     Estimated Creatinine Clearance: 43.3 mL/min (by C-G formula based on Cr of 1.29).   Medical History: Past Medical History  Diagnosis Date  . Hypertension   . Hyperlipidemia   . Tobacco abuse   . Chronic kidney disease     Creatinine 1.4  . Dizziness 10/09    [place]; bradycardia noted but dizziness probably vertigo  . Sinus bradycardia 11/09    Holter monitor; rare wide complex junctional escape rate at 60  . Aortic valve sclerosis 10/09    No stenosis, echo, October, 2009  . Aortic insufficiency 10/09    Mild to moderate, Echo, October, 2009  . Left ventricular hypertrophy 10/09    , Mild to moderate, echo,  2009  . Obstructive sleep apnea     ??? possible  . Abnormal chest x-ray 1. 2008  2. 9/10    1. Question Abn. CXRay ?? L2  2. Spine films, OK no osteolytic lesions seen  . Ejection fraction      EF 60-65%, echo, October, 2009    Assessment: [age] [facility] with chest pain, elevated troponin. Pharmacy consulted to dose  IV heparin. Likely cath soon. Pt. Received lovenox 80 mg at 1000 prior to transfer on 11/01/14.   The initial 8 hour heparin level is 0.41, therapeutic on 850 units/hr. No bleeding noted  Goal of Therapy:  Heparin level 0.3-0.7 units/ml Monitor platelets by anticoagulation protocol: Yes   Plan:  -  Continue Heparin 850 units/hr - f/u 6 hr heparin level to confirm remains therapeutic then daily heparin level and CBC.  - f/u plans for cath   Noah Delaineuth Denissa Cozart, RPh Clinical Pharmacist Pager: [phone]  11/02/2014,7:30 AM

## 2014-11-02 NOTE — Progress Notes (Signed)
  Subjective: No CP since Saturday  Objective: Vital signs in last 24 hours: Temp:  [97.8 F (36.6 C)-98.5 F (36.9 C)] 98.4 F (36.9 C) (05/23 0515) Pulse Rate:  [52-56] 52 (05/23 0515) Resp:  [18-20] 20 (05/23 0515) BP: (141-166)/(73-85) 141/73 mmHg (05/23 0515) SpO2:  [100 %] 100 % (05/23 0515) Weight:  [176 lb 14.4 oz (80.241 kg)-180 lb 11.2 oz (81.965 kg)] 176 lb 14.4 oz (80.241 kg) (05/23 0515) Last BM Date: 11/01/14  Intake/Output from previous day: 05/22 0701 - 05/23 0700 In: -  Out: 200 [Urine:200] Intake/Output this shift:    Medications Scheduled Meds: . amLODipine  5 mg Oral Daily  . aspirin  324 mg Oral NOW   Or  . aspirin  300 mg Rectal NOW  . aspirin EC  81 mg Oral Daily  . glimepiride  2 mg Oral BID WC  . insulin aspart  0-15 Units Subcutaneous TID WC  . insulin glargine  50 Units Subcutaneous QHS  . lisinopril  40 mg Oral Daily  . metFORMIN  500 mg Oral BID WC  . metoprolol tartrate  25 mg Oral BID  . simvastatin  40 mg Oral q1800  . sodium chloride  3 mL Intravenous Q12H   Continuous Infusions: . sodium chloride 1 mL/kg/hr (11/02/14 0730)  . heparin 850 Units/hr (11/01/14 2235)   PRN Meds:.sodium chloride, acetaminophen, nitroGLYCERIN, ondansetron (ZOFRAN) IV, sodium chloride  PE: General appearance: alert, cooperative and no distress Lungs: clear to auscultation bilaterally Heart: regular rate and rhythm and 2/6 sys MM Extremities: No LEE Pulses: 2+ and symmetric Skin: Warm and dry Neurologic: Grossly normal  Lab Results:   Recent Labs  11/01/14 2118  WBC 3.8*  HGB 13.7  HCT 42.0  PLT 193   BMET  Recent Labs  11/01/14 2118  NA 141  K 4.1  CL 109  CO2 24  GLUCOSE 102*  BUN 17  CREATININE 1.29*  CALCIUM 10.0   PT/INR No results for input(s): LABPROT, INR in the last 72 hours. Cholesterol  Recent Labs  11/02/14 0213  CHOL 170   Lipid Panel     Component Value Date/Time   CHOL 170 11/02/2014 0213   TRIG 121  11/02/2014 0213   HDL 45 11/02/2014 0213   CHOLHDL 3.8 11/02/2014 0213   VLDL 24 11/02/2014 0213   LDLCALC 101* 11/02/2014 0213   Cardiac Panel (last 3 results)  Recent Labs  11/01/14 2118 11/02/14 0213  TROPONINI 0.82* 0.77*     Assessment/Plan 80-year-old black male is transferred from [facility] hospital for evaluation of recent onset of chest pain and elevation of troponin. The patient has a history of diabetes, hypertension, hyperlipidemia and obesity. He has a family history of cardiac disease. He has a history of aortic valve disease and mild chronic kidney disease. He was seen recently by Dr. Katz and had new onset of left bundle branch block as well as some vague chest pain was due to have a stress test.  Principal Problem:   NSTEMI (non-ST elevated myocardial infarction) Active Problems:   Unstable angina   Hypertension   Hyperlipidemia   Tobacco abuse   Chronic kidney disease   Obstructive sleep apnea   LBBB (left bundle branch block)  Plan:  Left heart cath today around 1000hrs.  He is on heparin, ASA, metoprolol. Zocor, lisinopril 40, amlodipine 5..   Stop metformin until a few days after cath.  Will need post cath hydration for CKD.  Monitor BP.  Will   probably need to increase amlodipine.   LOS: 1 day    HAGER, Allen [name] 11/02/2014 7:47 AM As above, patient seen and examined. Patient denies chest pain or dyspnea. He has ruled in for a non-ST elevation myocardial infarction. Plan to continue aspirin, heparin, statin and metoprolol (decreased to 12.5 mg twice a day as he is having bradycardia). Proceed with cardiac catheterization. The risks and benefits were discussed and he agrees to proceed. Await echocardiogram to reassess aortic insufficiency. Increase amlodipine to 10 mg daily for blood pressure. Hold Glucophage for 48 hours following procedure. Follow renal function after procedure given baseline stage III kidney disease. Allen [name]   

## 2014-11-03 ENCOUNTER — Encounter (HOSPITAL_COMMUNITY): Payer: Self-pay | Admitting: Interventional Cardiology

## 2014-11-03 DIAGNOSIS — I214 Non-ST elevation (NSTEMI) myocardial infarction: Principal | ICD-10-CM

## 2014-11-03 LAB — CBC
HCT: 39.3 % (ref 39.0–52.0)
HEMOGLOBIN: 13 g/dL (ref 13.0–17.0)
MCH: 31.1 pg (ref 26.0–34.0)
MCHC: 33.1 g/dL (ref 30.0–36.0)
MCV: 94 fL (ref 78.0–100.0)
PLATELETS: 204 10*3/uL (ref 150–400)
RBC: 4.18 MIL/uL — AB (ref 4.22–5.81)
RDW: 14.7 % (ref 11.5–15.5)
WBC: 5.2 10*3/uL (ref 4.0–10.5)

## 2014-11-03 LAB — BASIC METABOLIC PANEL
Anion gap: 7 (ref 5–15)
BUN: 13 mg/dL (ref 6–20)
CHLORIDE: 110 mmol/L (ref 101–111)
CO2: 24 mmol/L (ref 22–32)
CREATININE: 1.21 mg/dL (ref 0.61–1.24)
Calcium: 9.9 mg/dL (ref 8.9–10.3)
GFR calc Af Amer: >60 mL/min (ref >60)
GFR calc non Af Amer: 56 mL/min — ABNORMAL LOW (ref >60)
GLUCOSE: 104 mg/dL — AB (ref 65–99)
POTASSIUM: 4.3 mmol/L (ref 3.5–5.1)
SODIUM: 141 mmol/L (ref 135–145)

## 2014-11-03 LAB — GLUCOSE, CAPILLARY
Glucose-Capillary: 103 mg/dL — ABNORMAL HIGH (ref 65–99)
Glucose-Capillary: 70 mg/dL (ref 65–99)

## 2014-11-03 MED ORDER — AMLODIPINE BESYLATE 10 MG PO TABS: 10.0000 mg | ORAL_TABLET | Freq: Every day | ORAL | Status: DC

## 2014-11-03 MED ORDER — ATORVASTATIN CALCIUM 80 MG PO TABS: 80.0000 mg | ORAL_TABLET | Freq: Every day | ORAL | Status: DC

## 2014-11-03 MED ORDER — FUROSEMIDE 20 MG PO TABS: 20.0000 mg | ORAL_TABLET | Freq: Every day | ORAL | Status: DC | PRN

## 2014-11-03 MED ORDER — NITROGLYCERIN 0.4 MG SL SUBL: 0.4000 mg | SUBLINGUAL_TABLET | SUBLINGUAL | Status: DC | PRN

## 2014-11-03 MED ORDER — ISOSORBIDE MONONITRATE ER 60 MG PO TB24: 60.0000 mg | ORAL_TABLET | Freq: Every day | ORAL | Status: DC

## 2014-11-03 MED ORDER — METFORMIN HCL 500 MG PO TABS: 500.0000 mg | ORAL_TABLET | Freq: Two times a day (BID) | ORAL | Status: DC

## 2014-11-03 MED ORDER — POTASSIUM CHLORIDE ER 10 MEQ PO TBCR: 10.0000 meq | EXTENDED_RELEASE_TABLET | Freq: Every day | ORAL | Status: DC | PRN

## 2014-11-03 MED ORDER — CLOPIDOGREL BISULFATE 75 MG PO TABS: 75.0000 mg | ORAL_TABLET | Freq: Every day | ORAL | Status: DC

## 2014-11-03 MED FILL — Heparin Sodium (Porcine) 2 Unit/ML in Sodium Chloride 0.9%: INTRAMUSCULAR | Qty: 1000 | Status: AC

## 2014-11-03 NOTE — Progress Notes (Signed)
Notified by CCMD patients HR down to 39 nonsustained.  Pt is typically SB RBBB 47-53.  Pt sleeping soundly/easily aroused/stable/no complaints voiced.  Will continue to monitor. Dierdre HighmanHall, Peggy Loge Marie, RN

## 2014-11-03 NOTE — Progress Notes (Addendum)
Inpatient Diabetes Program Recommendations  AACE/ADA: New Consensus Statement on Inpatient Glycemic Control (2013)  Target Ranges:  Prepandial:   less than 140 mg/dL      Peak postprandial:   less than 180 mg/dL (1-2 hours)      Critically ill patients:  140 - 180 mg/dL   Results for Allen [name], [name] (MRN [id number]) as of 11/03/2014 08:54  Ref. Range 11/02/2014 07:15 11/02/2014 11:03 11/02/2014 16:15 11/02/2014 21:03 11/02/2014 21:04 11/02/2014 21:45 11/02/2014 23:54 11/03/2014 07:44  Glucose-Capillary Latest Ref Range: 65-99 mg/dL 086102 (H) 94 93 40 (LL) 42 (LL) 96 106 (H) 103 (H)   Diabetes history: DM 2 Outpatient Diabetes medications: Amaryl 2mg  BID, Metformin 500 mg BID, Lantus 50 units QHS  If patient is not discharged please consider the following:  Inpatient Diabetes Program Recommendations  Insulin - Basal: Patient recieved half of his home dose of basal insulin and stayed in the 90's and below 110 mg/dl. Due to hypoglycemia in the 40's last pm, please consider decreasing basal down to Lantus 20 units QHS.  Oral Agents: While inpatient, due to hypoglycemia please discontinue Amaryl due to hypoglycemia.   Thanks,  Christena DeemShannon Donterius Filley RN, MSN, [facility] Inpatient Diabetes Coordinator Team Pager [phone]

## 2014-11-03 NOTE — Progress Notes (Signed)
Subjective: No further CP  Objective: Vital signs in last 24 hours: Temp:  [97.6 F (36.4 C)-98.7 F (37.1 C)] 97.6 F (36.4 C) (05/23 2000) Pulse Rate:  [0-68] 44 (05/23 1856) Resp:  [0-20] 18 (05/23 1823) BP: (128-168)/(61-104) 132/61 mmHg (05/23 1856) SpO2:  [0 %-100 %] 97 % (05/23 1856) Last BM Date: 11/01/14  Intake/Output from previous day: 05/23 0701 - 05/24 0700 In: 120 [P.O.:120] Out: 650 [Urine:650] Intake/Output this shift:    Medications Scheduled Meds: . amLODipine  10 mg Oral Daily  . aspirin EC  81 mg Oral Daily  . atorvastatin  80 mg Oral q1800  . clopidogrel  75 mg Oral Q breakfast  . enoxaparin (LOVENOX) injection  1 mg/kg Subcutaneous Q12H  . glimepiride  2 mg Oral BID WC  . insulin aspart  0-15 Units Subcutaneous TID WC  . insulin glargine  50 Units Subcutaneous QHS  . isosorbide mononitrate  60 mg Oral Daily  . lisinopril  40 mg Oral Daily  . metoprolol tartrate  12.5 mg Oral BID  . sodium chloride  3 mL Intravenous Q12H   Continuous Infusions:  PRN Meds:.sodium chloride, acetaminophen, nitroGLYCERIN, ondansetron (ZOFRAN) IV, oxyCODONE-acetaminophen, sodium chloride  PE: General appearance: alert, cooperative and no distress Lungs: clear to auscultation bilaterally Heart: regular rate and rhythm and 2/6 sys MM Extremities: No LEE Pulses: 2+ and symmetric Skin: Warm and dry.  Right wrist-nontender, no ecchymosis Neurologic: Grossly normal  Lab Results:   Recent Labs  11/01/14 2118 11/02/14 0809 11/03/14 0550  WBC 3.8* 5.0 5.2  HGB 13.7 14.2 13.0  HCT 42.0 43.7 39.3  PLT 193 219 204   BMET  Recent Labs  11/01/14 2118 11/03/14 0550  NA 141 141  K 4.1 4.3  CL 109 110  CO2 24 24  GLUCOSE 102* 104*  BUN 17 13  CREATININE 1.29* 1.21  CALCIUM 10.0 9.9   PT/INR  Recent Labs  11/02/14 0809  LABPROT 13.6  INR 1.02   Cholesterol  Recent Labs  11/02/14 0213  CHOL 170   Lipid Panel     Component Value Date/Time     CHOL 170 11/02/2014 0213   TRIG 121 11/02/2014 0213   HDL 45 11/02/2014 0213   CHOLHDL 3.8 11/02/2014 0213   VLDL 24 11/02/2014 0213   LDLCALC 101* 11/02/2014 0213   Study Conclusions  - Left ventricle: The cavity size was normal. There was mild concentric hypertrophy. Systolic function was moderately to severely reduced. The estimated ejection fraction was 35%. Diffuse hypokinesis. Doppler parameters are consistent with abnormal left ventricular relaxation (grade 1 diastolic dysfunction). Doppler parameters are consistent with elevated ventricular end-diastolic filling pressure. - Ventricular septum: Septal motion showed paradox. - Aortic valve: Trileaflet; moderately thickened, mildly calcified leaflets. Cusp separation was moderately reduced. Transvalvular velocity was within the normal range. There was no stenosis. There was mild regurgitation. - Aortic root: The aortic root was normal in size. - Mitral valve: Moderately thickened, mildly calcified leaflets . There was mild regurgitation. - Left atrium: The atrium was mildly dilated. - Right ventricle: Systolic function was normal. - Right atrium: The atrium was normal in size. - Tricuspid valve: There was mild regurgitation. - Pulmonary arteries: Systolic pressure was within the normal range. - Inferior vena cava: The vessel was normal in size. - Pericardium, extracardiac: There was no pericardial effusion.  Assessment/Plan  Principal Problem:   NSTEMI (non-ST elevated myocardial infarction) Active Problems:   Unstable angina   Aortic insufficiency  Hypertension   Hyperlipidemia   Tobacco abuse   Chronic kidney disease   Obstructive sleep apnea   LBBB (left bundle branch block)  SP LHC revealing:  Prox RCA-2 lesion, 80% stenosed.  Prox RCA-1 lesion, 70% stenosed.  Ost 2nd Mrg lesion, 100% stenosed.  Ost 1st Mrg to 1st Mrg lesion, 70% stenosed.  Ost Cx to Prox Cx lesion, 75%  stenosed.  1st Diag lesion, 75% stenosed.  Prox LAD to Mid LAD lesion, 35% stenosed.  Aggressive med therapy recommended. On ASA, plavix, imdur 60.  Not on a BB due to bradycardia.  Per Dr. Katrinka Blazing, "consider PCI on the circumflex into the first obtuse marginal. PCI on the right coronary would be complicated due to the shepherd's crook and relatively small vessel caliber but could also be treated. In absence of significant LAD disease, coronary bypass surgery should be considered only if symptoms are not treatable medically and we decided PCI is too high risk."  EF 35%, diffuse hypokinesis by echo.  G1DD.  LVEDP 19.  We discussed low sodium diet and daily weight monitoring.  On ACE.   BP better today.    SCr stable.  Ambulate this morning with Cardiac Rehab and if pain free, DC home.   FU with Dr. Katrinka Blazing initially then Myrtis Ser in [place]   LOS: 2 days    Wilburt Finlay PA-C 11/03/2014 7:44 AM  As above, patient seen and examined. He denies chest pain or dyspnea. Radial cath site with no hematoma. Cardiac catheterization revealed significant coronary disease and his ejection fraction is 35%. Plan medical therapy as outlined by Dr. Katrinka Blazing. Continue aspirin, Plavix, statin, amlodipine, nitrates. No beta blocker given significant bradycardia. Ambulate today and if no chest pain discharge later and follow-up with Dr. Myrtis Ser and Dr. Katrinka Blazing. Note his echocardiogram showed mild aortic insufficiency. Hold Glucophage for 48 hours following recent catheterization. >30 min PA and physician time D2 Olga Millers

## 2014-11-03 NOTE — Progress Notes (Signed)
CARDIAC REHAB PHASE I   PRE:  Rate/Rhythm: 47 SB BBB  BP:  Supine:   Sitting: 124/70  Standing:    SaO2: 97%RA  MODE:  Ambulation: 700 ft   POST:  Rate/Rhythm: 81 SR BBB  BP:  Supine:   Sitting: 142/74  Standing:    SaO2: 97%RA  1000-1100 Pt walked 700 ft with steady gait and no CP. Tolerated well. MI and CHF education completed with pt and family member who voiced understanding. Gave pt CHF booklet and reviewed when to call MD with weight gain, importance of sodium restriction and fluid restriction. Discussed CRP 2 and pt gave permission to [facility]. Stressed with pt to notify cardiologist if any Cp or tightness since known blockages.   Allen Nuttingharlene Tanish Prien, RN BSN  11/03/2014 10:55 AM

## 2014-11-03 NOTE — Progress Notes (Signed)
UR Completed Christene Pounds Graves-Bigelow, RN,BSN [phone]  

## 2014-11-03 NOTE — Discharge Summary (Signed)
Physician Discharge Summary     Cardiologist:  Myrtis SerKatz in [place].  Would like to see Katrinka BlazingSmith at least once in [place].   Patient ID: Allen [name] MRN: [id number] DOB/AGE: 79/01/1934 79 y.o.  Admit date: 11/01/2014 Discharge date: 11/03/2014  Admission Diagnoses:  NSTEMI, Unstable angina  Discharge Diagnoses:  Principal Problem:   NSTEMI (non-ST elevated myocardial infarction) Active Problems:   Unstable angina   Aortic insufficiency   Hypertension   Hyperlipidemia   Tobacco abuse   Chronic kidney disease   Obstructive sleep apnea   LBBB (left bundle branch block)   Discharged Condition: stable  Hospital Course:   81 year old black male is transferred [facility] for evaluation of recent onset of chest pain and elevation of troponin. The patient has a history of diabetes, hypertension, hyperlipidemia and obesity. He has a family history of cardiac disease. He has a history of aortic valve disease and mild chronic kidney disease. He was seen recently by Dr. Myrtis SerKatz and had new onset of left bundle branch block as well as some vague chest pain was due to have a stress test. He describes chest discomfort occurring with exertion over the past month. He was working on [employer] and had midsternal pressure and heaviness that started on May 20 and was brought into the hospital. [facility] he had troponin elevations to 0.17 and 0.22. He was transferred here for further evaluation and potential catheterization. He is currently pain-free.   He was admitted and started on IV heparin.  He went for a left heart cath which revealed severe 2 vessel coronary disease with tandem 70-80%, RCA stenoses both proximal and distal to a shepherd's crook. The left circumflex is also severely diseased with a segmental 70-75% proximal circumflex stenosis with eccentric 70-80% first obtuse marginal stenosis. The second obtuse marginal is totally occluded and fills late by left to left and  right-to-left collaterals.  Widely patent LAD with a segmental 75% first diagonal.  Mild aortic regurgitation.  Reduced LV systolic function with EF of 35%.  He was started on ASA, plavix, Imdur 60, lipitor 80mg , lisinopril 40 and amlodipine which was increased to 10mg  for better BP control.  The morning of his discharge he ambulated in the hall with cardiac rehab without difficulties.  He will be sent home with PRN lasix and potassium.  The patient was seen by Dr. Jens Somrenshaw who felt she was stable for DC home.   Consults: Cardiac Rehab  Significant Diagnostic Studies:  Cardiac Cath Conclusion    1. Prox RCA-2 lesion, 80% stenosed. 2. Prox RCA-1 lesion, 70% stenosed. 3. Ost 2nd Mrg lesion, 100% stenosed. 4. Ost 1st Mrg to 1st Mrg lesion, 70% stenosed. 5. Ost Cx to Prox Cx lesion, 75% stenosed. 6. 1st Diag lesion, 75% stenosed. 7. Prox LAD to Mid LAD lesion, 35% stenosed.   Severe 2 vessel coronary disease with tandem 70-80% RCA stenoses both proximal and distal to a shepherd's crook. The left circumflex is also severely diseased with a segmental 70-75% proximal circumflex stenosis with eccentric 70-80% first obtuse marginal stenosis. The second obtuse marginal is totally occluded and fills late by left to left and right-to-left collaterals.  Widely patent LAD with a segmental 75% first diagonal.  Mild aortic regurgitation.  Reduced LV systolic function with EF of 35%.  RECOMMENDATIONS:  Both the right coronary and circumflex contain complex disease. The LAD is widely patent. I would recommend aggressive medical therapy and if refractory symptoms consider PCI on the  circumflex into the first obtuse marginal. PCI on the right coronary would be complicated due to the shepherd's crook and relatively small vessel caliber but could also be treated. In absence of significant LAD disease, coronary bypass surgery should be considered only if symptoms are not treatable medically and we decided PCI  is too high risk.  I have started Plavix. Isosorbide mononitrate has also been started. I've avoided beta blocker therapy because of his bundle branch block and significant bradycardia.     Echo Study Conclusions  - Left ventricle: The cavity size was normal. There was mild concentric hypertrophy. Systolic function was moderately to severely reduced. The estimated ejection fraction was 35%. Diffuse hypokinesis. Doppler parameters are consistent with abnormal left ventricular relaxation (grade 1 diastolic dysfunction). Doppler parameters are consistent with elevated ventricular end-diastolic filling pressure. - Ventricular septum: Septal motion showed paradox. - Aortic valve: Trileaflet; moderately thickened, mildly calcified leaflets. Cusp separation was moderately reduced. Transvalvular velocity was within the normal range. There was no stenosis. There was mild regurgitation. - Aortic root: The aortic root was normal in size. - Mitral valve: Moderately thickened, mildly calcified leaflets . There was mild regurgitation. - Left atrium: The atrium was mildly dilated. - Right ventricle: Systolic function was normal. - Right atrium: The atrium was normal in size. - Tricuspid valve: There was mild regurgitation. - Pulmonary arteries: Systolic pressure was within the normal range. - Inferior vena cava: The vessel was normal in size. - Pericardium, extracardiac: There was no pericardial effusion.  Treatments: See above  Discharge Exam: Blood pressure 122/60, pulse 53, temperature 98.7 F (37.1 C), temperature source Oral, resp. rate 18, height  (1.575 m), weight 176 lb 14.4 oz (80.241 kg), SpO2 94 %.   Disposition: Final discharge disposition not confirmed      Discharge Instructions    Diet - low sodium heart healthy    Complete by:  As directed      Discharge instructions    Complete by:  As directed   Monitor your weight every morning.  If you  gain 3 pounds in 24 hours, or 5 pounds in a week, take a furosemide pill.  If your weight does not return to baseline in two days, call the office.     Increase activity slowly    Complete by:  As directed             Medication List    STOP taking these medications        simvastatin 40 MG tablet  Commonly known as:  ZOCOR      TAKE these medications        amLODipine 10 MG tablet  Commonly known as:  NORVASC  Take 1 tablet (10 mg total) by mouth daily.     aspirin 81 MG EC tablet  Take 81 mg by mouth daily.     atorvastatin 80 MG tablet  Commonly known as:  LIPITOR  Take 1 tablet (80 mg total) by mouth daily at 6 PM.     clopidogrel 75 MG tablet  Commonly known as:  PLAVIX  Take 1 tablet (75 mg total) by mouth daily with breakfast.     furosemide 20 MG tablet  Commonly known as:  LASIX  Take 1 tablet (20 mg total) by mouth daily as needed for fluid.     glimepiride 2 MG tablet  Commonly known as:  AMARYL  Take 2 mg by mouth 2 (two) times daily.     isosorbide  mononitrate 60 MG 24 hr tablet  Commonly known as:  IMDUR  Take 1 tablet (60 mg total) by mouth daily.     LANTUS SOLOSTAR 100 UNIT/ML Solostar Pen  Generic drug:  Insulin Glargine  Inject 50 Units into the skin at bedtime.     lisinopril 40 MG tablet  Commonly known as:  PRINIVIL,ZESTRIL  Take 40 mg by mouth daily.     meloxicam 7.5 MG tablet  Commonly known as:  MOBIC  Take 7.5 mg by mouth daily. Take with food.     metFORMIN 500 MG tablet  Commonly known as:  GLUCOPHAGE  Take 1 tablet (500 mg total) by mouth 2 (two) times daily.     nitroGLYCERIN 0.4 MG SL tablet  Commonly known as:  NITROSTAT  Place 1 tablet (0.4 mg total) under the tongue every 5 (five) minutes x 3 doses as needed for chest pain.     potassium chloride 10 MEQ tablet  Commonly known as:  K-DUR  Take 1 tablet (10 mEq total) by mouth daily as needed. Take only if you take the furosemide(Lasix)       Follow-up Information     Follow up with Laqueta Linden, MD On 11/17/2014.   Specialty:  Cardiology   Why:  4:20 PM    Contact information:   [address] [place] [address] [phone]      Greater than 30 minutes was spent completing the patient's discharge.   SignedWilburt Finlay, PAC 11/03/2014, 10:23 AM

## 2014-11-05 ENCOUNTER — Telehealth: Payer: Self-pay | Admitting: Cardiovascular Disease

## 2014-11-05 LAB — GLUCOSE, CAPILLARY: Glucose-Capillary: 40 mg/dL — CL (ref 65–99)

## 2014-11-05 NOTE — Telephone Encounter (Signed)
Would like to know if these test are still needed. He is just got out of [facility] on Tuesday for having a heart attack

## 2014-11-05 NOTE — Telephone Encounter (Signed)
Will forward to Dr. Myrtis SerKatz to see if echo and stress test needs to be done. Pt had NSTEMI and then cath on 5/23

## 2014-11-06 NOTE — Telephone Encounter (Signed)
These tests should be canceled. He does not need these tests.

## 2014-11-06 NOTE — Telephone Encounter (Signed)
Sharon informed.

## 2014-11-08 ENCOUNTER — Encounter: Payer: Self-pay | Admitting: Cardiology

## 2014-11-10 ENCOUNTER — Encounter (HOSPITAL_COMMUNITY): Payer: Medicare PPO

## 2014-11-10 ENCOUNTER — Other Ambulatory Visit (HOSPITAL_COMMUNITY): Payer: Medicare PPO

## 2014-11-11 ENCOUNTER — Ambulatory Visit (HOSPITAL_COMMUNITY): Payer: Medicare PPO

## 2014-11-17 ENCOUNTER — Encounter: Payer: Medicare PPO | Admitting: Cardiovascular Disease

## 2014-11-24 ENCOUNTER — Encounter: Payer: Self-pay | Admitting: Cardiology

## 2014-11-24 DIAGNOSIS — I2510 Atherosclerotic heart disease of native coronary artery without angina pectoris: Secondary | ICD-10-CM | POA: Insufficient documentation

## 2014-11-26 ENCOUNTER — Ambulatory Visit (INDEPENDENT_AMBULATORY_CARE_PROVIDER_SITE_OTHER): Payer: Medicare PPO | Admitting: Cardiology

## 2014-11-26 ENCOUNTER — Other Ambulatory Visit: Payer: Self-pay | Admitting: *Deleted

## 2014-11-26 ENCOUNTER — Encounter: Payer: Self-pay | Admitting: Cardiology

## 2014-11-26 ENCOUNTER — Telehealth: Payer: Self-pay | Admitting: *Deleted

## 2014-11-26 VITALS — BP 148/77 | HR 55 | Ht 63.0 in | Wt 177.8 lb

## 2014-11-26 DIAGNOSIS — I25110 Atherosclerotic heart disease of native coronary artery with unstable angina pectoris: Secondary | ICD-10-CM | POA: Diagnosis not present

## 2014-11-26 DIAGNOSIS — E785 Hyperlipidemia, unspecified: Secondary | ICD-10-CM | POA: Diagnosis not present

## 2014-11-26 DIAGNOSIS — R0602 Shortness of breath: Secondary | ICD-10-CM

## 2014-11-26 DIAGNOSIS — N182 Chronic kidney disease, stage 2 (mild): Secondary | ICD-10-CM

## 2014-11-26 DIAGNOSIS — I351 Nonrheumatic aortic (valve) insufficiency: Secondary | ICD-10-CM | POA: Diagnosis not present

## 2014-11-26 DIAGNOSIS — I255 Ischemic cardiomyopathy: Secondary | ICD-10-CM | POA: Insufficient documentation

## 2014-11-26 MED ORDER — POTASSIUM CHLORIDE ER 10 MEQ PO TBCR: 10.0000 meq | EXTENDED_RELEASE_TABLET | Freq: Every day | ORAL | Status: DC | PRN

## 2014-11-26 MED ORDER — FUROSEMIDE 20 MG PO TABS: 20.0000 mg | ORAL_TABLET | Freq: Every day | ORAL | Status: DC | PRN

## 2014-11-26 MED ORDER — LISINOPRIL 40 MG PO TABS: 40.0000 mg | ORAL_TABLET | Freq: Every day | ORAL | Status: DC

## 2014-11-26 MED ORDER — CLOPIDOGREL BISULFATE 75 MG PO TABS: 75.0000 mg | ORAL_TABLET | Freq: Every day | ORAL | Status: DC

## 2014-11-26 MED ORDER — AMLODIPINE BESYLATE 10 MG PO TABS: 10.0000 mg | ORAL_TABLET | Freq: Every day | ORAL | Status: DC

## 2014-11-26 MED ORDER — ISOSORBIDE MONONITRATE ER 60 MG PO TB24: 60.0000 mg | ORAL_TABLET | Freq: Every day | ORAL | Status: DC

## 2014-11-26 MED ORDER — ATORVASTATIN CALCIUM 80 MG PO TABS: 80.0000 mg | ORAL_TABLET | Freq: Every day | ORAL | Status: AC

## 2014-11-26 NOTE — Assessment & Plan Note (Signed)
It is not clear when his ejection fraction decreased. Previously it had been normal. It is not clear that it is all due to ischemic disease. He is on appropriate medications. Unfortunately I cannot use a beta blocker because of bundle branch block and bradycardia. He has renal insufficiency and I'm not inclined to add spironolactone as of today. He did present with some ischemia and slight troponin elevation. Over time will do a follow-up 2-D echo to see his LV function is improving.

## 2014-11-26 NOTE — Assessment & Plan Note (Signed)
It is difficult to know if his shortness of breath is from his left ventricle or ischemia at this time. However he seems to have chest discomfort when he has ischemia. He has left ventricular dysfunction. His volume status is stable.

## 2014-11-26 NOTE — Telephone Encounter (Signed)
Patient informed. 

## 2014-11-26 NOTE — Telephone Encounter (Signed)
-----   Message from Luis Abed, MD sent at 11/26/2014  4:20 PM EDT ----- Please notify the patient that the blood tests from today looks quite good. I am pleased with the result.

## 2014-11-26 NOTE — Assessment & Plan Note (Signed)
Renal function is being watched carefully. He is having up chemistry study later today.

## 2014-11-26 NOTE — Assessment & Plan Note (Signed)
He is on high-dose atorvastatin. 

## 2014-11-26 NOTE — Assessment & Plan Note (Signed)
Aortic insufficiency will be followed over time.

## 2014-11-26 NOTE — Assessment & Plan Note (Signed)
The patient did present with chest pain and slight enzyme elevation. I described the catheterization data above. He has significant two-vessel disease. His LAD is widely patent. Because his LAD is patent he is not a good candidate for bypass surgery at this time. The disease in his circumflex and right coronary is complex. Decision is made to treat him medically first. If he has recurrent significant symptoms, PCI could be attempted to both the right and the circumflex with a higher risk. The patient has not had chest discomfort since being at home. Plavix was added and Imdur was added and he has better blood pressure control. He is not return to full activities. I will see him back for early follow-up to follow up the issues.  As part of today's evaluation I spent greater than 25 minutes with the patient. Within half of this time was with direct discussion with the patient and his wife and his niece. We actually probably spent closer to 45 minutes total.

## 2014-11-26 NOTE — Progress Notes (Signed)
Cardiology Office Note   Date:  11/26/2014   ID:  Allen [name], DOB 04/28/1934, MRN [id number]  PCP:  [name], MD  Cardiologist:  Willa Rough, MD   Chief Complaint  Patient presents with  . Appointment    Follow-up coronary artery disease      History of Present Illness: Allen [name] is a 81 y.o. male who presents today to follow-up coronary disease and recent hospitalization. I saw him last in the office Oct 23, 2014. He mentioned to me that he was having some exertional shortness of breath and some exertional chest discomfort. He did have a nuclear stress test and an echo. Before these studies were done the patient presented to [facility]. He had troponin elevations of 0.17 and  0.22. He was transferred to [facility] for further evaluation. Catheterization revealed severe two-vessel disease. His LAD did not have any significant abnormalities. He had proximal and distal lesions in his right coronary artery. 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 He had significant circumflex disease that was segmental including a first OM. Second OM was totally occluded with left to left and right-to-left collaterals. EF was in the range of 35%. It was felt that he should be treated with the addition of Plavix to his aspirin and Imdur. Amlodipine dose was increased for his blood pressure. It was felt that with no significant LAD disease, CABG would not be appropriate. Approaching the lesion in his right coronary artery would be higher risk. It was felt that he had significant symptoms after medication adjustments PCI could be done of the circumflex and complicated PCI could be attempted to the RCA. Beta blocker is not used because of bundle branch block and bradycardia.  The patient returns to the office today. He still has some exertional shortness of breath. However, he has not had the chest tightness he had before. However his exercise level has not been back to usual for  him.  As part of today's evaluation I spent an extensive amount of time reviewing his hospital records to fully up-to-date his outpatient record.    Past Medical History  Diagnosis Date  . Hypertension   . Hyperlipidemia   . Tobacco abuse   . Chronic kidney disease     Creatinine 1.4  . Dizziness 10/09    [facility]; bradycardia noted but dizziness probably vertigo  . Sinus bradycardia 11/09    Holter monitor; rare wide complex junctional escape rate at 60  . Aortic valve sclerosis 10/09    No stenosis, echo, October, 2009  . Aortic insufficiency 10/09    Mild to moderate, Echo, October, 2009  . Left ventricular hypertrophy 10/09    , Mild to moderate, echo,  2009  . Obstructive sleep apnea     ??? possible  . Abnormal chest x-ray 1. 2008  2. 9/10    1. Question Abn. CXRay ?? L2  2. Spine films, [place] no osteolytic lesions seen  . Ejection fraction      EF 60-65%, echo, October, 2009    Past Surgical History  Procedure Laterality Date  . Cardiac catheterization N/A 11/02/2014    Procedure: Left Heart Cath and Coronary Angiography;  Surgeon: Lyn Records, MD;  Location[facility] INVASIVE CV LAB;  Service: Cardiovascular;  Laterality: N/A;    Patient Active Problem List   Diagnosis Date Noted  . CAD (coronary artery disease), native coronary artery 11/24/2014  . LBBB (left bundle branch block) 10/23/2014  . Mitral  regurgitation 09/19/2013  . Shortness of breath 08/29/2013  . Dizziness   . Aortic valve sclerosis   . Aortic insufficiency   . Left ventricular hypertrophy   . Ejection fraction   . Abnormal chest x-ray   . Hypertension   . Hyperlipidemia   . Tobacco abuse   . Chronic kidney disease   . Sinus bradycardia   . Obstructive sleep apnea   . DYSLIPIDEMIA 12/30/2008      Current Outpatient Prescriptions  Medication Sig Dispense Refill  . amLODipine (NORVASC) 10 MG tablet Take 1 tablet (10 mg total) by mouth daily. 90 tablet 3  . aspirin 81 MG EC tablet Take 81 mg  by mouth daily.    Marland Kitchen atorvastatin (LIPITOR) 80 MG tablet Take 1 tablet (80 mg total) by mouth daily at 6 PM. 90 tablet 3  . clopidogrel (PLAVIX) 75 MG tablet Take 1 tablet (75 mg total) by mouth daily with breakfast. 90 tablet 3  . furosemide (LASIX) 20 MG tablet Take 1 tablet (20 mg total) by mouth daily as needed for fluid. 90 tablet 3  . glimepiride (AMARYL) 2 MG tablet Take 2 mg by mouth 2 (two) times daily.    . isosorbide mononitrate (IMDUR) 60 MG 24 hr tablet Take 1 tablet (60 mg total) by mouth daily. 90 tablet 3  . LANTUS SOLOSTAR 100 UNIT/ML Solostar Pen Inject 50 Units into the skin at bedtime.    Marland Kitchen lisinopril (PRINIVIL,ZESTRIL) 40 MG tablet Take 1 tablet (40 mg total) by mouth daily. 90 tablet 3  . metFORMIN (GLUCOPHAGE) 500 MG tablet Take 1 tablet (500 mg total) by mouth 2 (two) times daily.    . nitroGLYCERIN (NITROSTAT) 0.4 MG SL tablet Place 1 tablet (0.4 mg total) under the tongue every 5 (five) minutes x 3 doses as needed for chest pain. 25 tablet 12  . potassium chloride (K-DUR) 10 MEQ tablet Take 1 tablet (10 mEq total) by mouth daily as needed. Take only if you take the furosemide(Lasix) 90 tablet 3  . meloxicam (MOBIC) 7.5 MG tablet Take 7.5 mg by mouth daily. Take with food.     No current facility-administered medications for this visit.    Allergies:   Review of patient's allergies indicates no known allergies.    Social History:  The patient  reports that he quit smoking about 3 years ago. He has never used smokeless tobacco. He reports that he does not drink alcohol or use illicit drugs.   Family History:  The patient's family history is negative for Coronary artery disease.    ROS:  Please see the history of present illness.    Patient denies fever, chills, headache, sweats, rash, change in vision, change in hearing, chest pain, cough, nausea or vomiting, urinary symptoms. All other systems are reviewed and are negative.   PHYSICAL EXAM: VS:  BP 148/77 mmHg   Pulse 55  Ht 5\' 3"  (1.6 m)  Wt 177 lb 12.8 oz (80.65 kg)  BMI 31.50 kg/m2  SpO2 98% , Patient is stable today. He is oriented to person time and place. Affect is normal. Also in the room are his wife and his niece. Head is atraumatic. Sclera and conjunctiva are normal. There is no jugular venous distention. Lungs are clear. Respiratory effort is not labored. Cardiac exam reveals S1 and S2. The abdomen is soft. There is no peripheral edema. There are no musculoskeletal deformities. There are no skin rashes. Neurologic is grossly intact.  EKG:   EKG is  not done today.   Recent Labs: 11/01/2014: ALT 27; TSH 3.413 11/03/2014: BUN 13; Creatinine, Ser 1.21; Hemoglobin 13.0; Platelets 204; Potassium 4.3; Sodium 141    Lipid Panel    Component Value Date/Time   CHOL 170 11/02/2014 0213   TRIG 121 11/02/2014 0213   HDL 45 11/02/2014 0213   CHOLHDL 3.8 11/02/2014 0213   VLDL 24 11/02/2014 0213   LDLCALC 101* 11/02/2014 0213      Wt Readings from Last 3 Encounters:  11/26/14 177 lb 12.8 oz (80.65 kg)  11/02/14 176 lb 14.4 oz (80.241 kg)  10/23/14 183 lb 6.4 oz (83.19 kg)      Current medicines are reviewed.   I had an extensive discussion with the patient and his wife and his niece outlining the rationale for every medicine that he uses. His niece has the best understanding.     ASSESSMENT AND PLAN:

## 2014-11-26 NOTE — Patient Instructions (Signed)
Your physician recommends that you continue on your current medications as directed. Please refer to the Current Medication list given to you today. Your physician recommends that you return for lab work today to check your BMET. We will forward a copy of this to Dr. Olena Leatherwood. Your physician recommends that you schedule a follow-up appointment on 03/03/15 with Dr. Myrtis Ser.

## 2014-12-24 DIAGNOSIS — R972 Elevated prostate specific antigen [PSA]: Secondary | ICD-10-CM | POA: Diagnosis not present

## 2015-02-24 ENCOUNTER — Encounter: Payer: Self-pay | Admitting: Cardiology

## 2015-02-24 ENCOUNTER — Ambulatory Visit (INDEPENDENT_AMBULATORY_CARE_PROVIDER_SITE_OTHER): Payer: Medicare PPO | Admitting: Cardiology

## 2015-02-24 VITALS — BP 114/58 | HR 54 | Ht 63.0 in | Wt 176.8 lb

## 2015-02-24 DIAGNOSIS — N189 Chronic kidney disease, unspecified: Secondary | ICD-10-CM | POA: Diagnosis not present

## 2015-02-24 DIAGNOSIS — E785 Hyperlipidemia, unspecified: Secondary | ICD-10-CM

## 2015-02-24 DIAGNOSIS — I351 Nonrheumatic aortic (valve) insufficiency: Secondary | ICD-10-CM

## 2015-02-24 DIAGNOSIS — I25118 Atherosclerotic heart disease of native coronary artery with other forms of angina pectoris: Secondary | ICD-10-CM | POA: Diagnosis not present

## 2015-02-24 DIAGNOSIS — R0602 Shortness of breath: Secondary | ICD-10-CM

## 2015-02-24 DIAGNOSIS — I255 Ischemic cardiomyopathy: Secondary | ICD-10-CM

## 2015-02-24 NOTE — Assessment & Plan Note (Signed)
In the notes above, I have outlined fully the finding at the time of the patient's catheterization. Decision was made at that time to follow him medically. Consideration could be given to high risk PCI if he were to have significant symptoms. At this time, he is not having any significant symptoms. No change in therapy. A beta blocker is not used because of bundle branch block and moderate resting sinus bradycardia.

## 2015-02-24 NOTE — Assessment & Plan Note (Signed)
The patient had a sensation of shortness of breath before his evaluation in March, 2016. I suspect that this is part of his anginal symptoms. Currently he is not having any significant shortness of breath.

## 2015-02-24 NOTE — Patient Instructions (Signed)
Your physician recommends that you continue on your current medications as directed. Please refer to the Current Medication list given to you today. Your physician recommends that you schedule a follow-up appointment in: 3-4 months with Dr. Diona Browner. You will receive a reminder letter in the mail in about 1-2 months reminding you to call and schedule your appointment. If you don't receive this letter, please contact our office.

## 2015-02-24 NOTE — Assessment & Plan Note (Signed)
The patient had mild aortic insufficiency at the time of his cath in May, 2016. This can be followed over time.

## 2015-02-24 NOTE — Assessment & Plan Note (Signed)
Historically the patient had normal LV function. At the time of his cath in May, 2016, his EF was 35%. I discussed with the patient and his niece the fact that he will need a follow-up echo. We have decided not to do this yet. This can be considered when he is seen next in the office in follow-up. He is on Imdur. He is on lisinopril. He is not on a beta blocker because of bundle branch block and bradycardia. Consideration could be given to adding hydralazine. I've chosen not to do that at this point today. I've also chosen to not yet consider spironolactone. When the patient is seen in the office in follow-up next, consideration could be given to proceeding with repeat 2-D echo. Decisions could then be made about any further titration of his medicines.

## 2015-02-24 NOTE — Assessment & Plan Note (Signed)
The patient's renal function has been followed carefully. Labs were checked at the time of his last visit and his renal function was stable.

## 2015-02-24 NOTE — Progress Notes (Signed)
Cardiology Office Note   Date:  02/24/2015   ID:  Allen [name], DOB 10/29/1933, MRN [id number]  PCP:  [name], MD  Cardiologist:  Willa Rough, MD   Chief Complaint  Patient presents with  . Appointment    Follow-up coronary artery disease      History of Present Illness: Allen [name] is a 81 y.o. male who presents today to follow-up coronary artery disease. I had seen him in the office on Oct 23, 2014. At that time he mentioned he was having some exertional shortness of breath and some exertional chest tightness. I made arrangements for him to have both an echo and a nuclear stress test. However before these were done, the patient presented to [facility] with chest discomfort. Troponins were elevated at 0.17 and 0.22. He was transferred to [facility] for further evaluation. Catheterization revealed severe two-vessel disease. LAD revealed no significant abnormalities. He had proximal and distal lesions in his right coronary artery. 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 He had significant circumflex disease that was segmental including OM1. OM 2 was totally occluded with left to left and right-to-left collaterals. His ejection fraction was 35%. An echo in 2009 had shown normal left ventricular function. It was decided that he should be treated medically. Plavix was added to his aspirin and Imdur was added. Amlodipine dose was increased for blood pressure. It was felt that with no significant LAD disease, CABG would not be the best approach. Approaching the lesion in his right coronary would be high risk. It was felt that if he had significant symptoms, PCI could be done to the circumflex and complicated PCI could be attempted to the RCA. Beta blocker is not used because he has bundle branch block and moderate resting sinus bradycardia.  I saw him back in the office in November 26, 2014. He was not having any significant symptoms. At that visit I had an extensive  discussion with the patient and his niece to fully explain all aspects of his cardiac care. He returns today and he is feeling well. He is not having any significant chest pain. He is not having any significant exertional shortness of breath.    Past Medical History  Diagnosis Date  . Hypertension   . Hyperlipidemia   . Tobacco abuse   . Chronic kidney disease     Creatinine 1.4  . Dizziness 10/09    [place]; bradycardia noted but dizziness probably vertigo  . Sinus bradycardia 11/09    Holter monitor; rare wide complex junctional escape rate at 60  . Aortic valve sclerosis 10/09    No stenosis, echo, October, 2009  . Aortic insufficiency 10/09    Mild to moderate, Echo, October, 2009  . Left ventricular hypertrophy 10/09    , Mild to moderate, echo,  2009  . Obstructive sleep apnea     ??? possible  . Abnormal chest x-ray 1. 2008  2. 9/10    1. Question Abn. CXRay ?? L2  2. Spine films, [place] no osteolytic lesions seen  . Ejection fraction      EF 60-65%, echo, October, 2009    Past Surgical History  Procedure Laterality Date  . Cardiac catheterization N/A 11/02/2014    Procedure: Left Heart Cath and Coronary Angiography;  Surgeon: Lyn Records, MD;  Location: [facility] INVASIVE CV LAB;  Service: Cardiovascular;  Laterality: N/A;    Patient Active Problem List   Diagnosis Date Noted  . Ischemic  cardiomyopathy 11/26/2014  . CAD (coronary artery disease), native coronary artery 11/24/2014  . LBBB (left bundle branch block) 10/23/2014  . Mitral regurgitation 09/19/2013  . Shortness of breath 08/29/2013  . Dizziness   . Aortic valve sclerosis   . Aortic insufficiency   . Left ventricular hypertrophy   . Ejection fraction   . Abnormal chest x-ray   . Hypertension   . Hyperlipidemia   . Tobacco abuse   . Chronic kidney disease   . Sinus bradycardia   . Obstructive sleep apnea       Current Outpatient Prescriptions  Medication Sig Dispense Refill  . amLODipine (NORVASC) 10  MG tablet Take 1 tablet (10 mg total) by mouth daily. 90 tablet 3  . aspirin 81 MG EC tablet Take 81 mg by mouth daily.    Marland Kitchen atorvastatin (LIPITOR) 80 MG tablet Take 1 tablet (80 mg total) by mouth daily at 6 PM. 90 tablet 3  . clopidogrel (PLAVIX) 75 MG tablet Take 1 tablet (75 mg total) by mouth daily with breakfast. 90 tablet 3  . furosemide (LASIX) 20 MG tablet Take 1 tablet (20 mg total) by mouth daily as needed for fluid. 90 tablet 3  . glimepiride (AMARYL) 2 MG tablet Take 2 mg by mouth 2 (two) times daily.    . isosorbide mononitrate (IMDUR) 60 MG 24 hr tablet Take 1 tablet (60 mg total) by mouth daily. 90 tablet 3  . LANTUS SOLOSTAR 100 UNIT/ML Solostar Pen Inject 50 Units into the skin at bedtime.    Marland Kitchen lisinopril (PRINIVIL,ZESTRIL) 40 MG tablet Take 1 tablet (40 mg total) by mouth daily. 90 tablet 3  . meloxicam (MOBIC) 7.5 MG tablet Take 7.5 mg by mouth daily. Take with food.    . metFORMIN (GLUCOPHAGE) 500 MG tablet Take 1 tablet (500 mg total) by mouth 2 (two) times daily.    . nitroGLYCERIN (NITROSTAT) 0.4 MG SL tablet Place 1 tablet (0.4 mg total) under the tongue every 5 (five) minutes x 3 doses as needed for chest pain. 25 tablet 12  . potassium chloride (K-DUR) 10 MEQ tablet Take 1 tablet (10 mEq total) by mouth daily as needed. Take only if you take the furosemide(Lasix) 90 tablet 3   No current facility-administered medications for this visit.    Allergies:   Review of patient's allergies indicates no known allergies.    Social History:  The patient  reports that he quit smoking about 3 years ago. He has never used smokeless tobacco. He reports that he does not drink alcohol or use illicit drugs.   Family History:  The patient's family history is negative for Coronary artery disease.    ROS:  Please see the history of present illness.     Patient denies fever, chills, headache, sweats, rash, change in vision, change in hearing, chest pain, cough, nausea or vomiting,  urinary symptoms. All other systems are reviewed and are negative.   PHYSICAL EXAM: VS:  BP 114/58 mmHg  Pulse 54  Ht  (1.6 m)  Wt 176 lb 12.8 oz (80.196 kg)  BMI 31.33 kg/m2  SpO2 96% , Patient is oriented to person time and place. Affect is normal. He is here with his niece. Head is atraumatic. Sclera and conjunctiva are normal. There is no jugulovenous distention. Lungs are clear. Respiratory effort is not labored. Cardiac exam reveals S1 and S2. There is a systolic murmur and a soft murmur of aortic insufficiency. The abdomen is soft. There is  no peripheral edema. There are no musculoskeletal deformities. There are no skin rashes.   EKG:   EKG is not done today.   Recent Labs: 11/01/2014: ALT 27; TSH 3.413 11/03/2014: BUN 13; Creatinine, Ser 1.21; Hemoglobin 13.0; Platelets 204; Potassium 4.3; Sodium 141    Lipid Panel    Component Value Date/Time   CHOL 170 11/02/2014 0213   TRIG 121 11/02/2014 0213   HDL 45 11/02/2014 0213   CHOLHDL 3.8 11/02/2014 0213   VLDL 24 11/02/2014 0213   LDLCALC 101* 11/02/2014 0213      Wt Readings from Last 3 Encounters:  02/24/15 176 lb 12.8 oz (80.196 kg)  11/26/14 177 lb 12.8 oz (80.65 kg)  11/02/14 176 lb 14.4 oz (80.241 kg)      Current medicines are reviewed  The patient understands his medications.     ASSESSMENT AND PLAN:

## 2015-02-24 NOTE — Assessment & Plan Note (Signed)
The patient is receiving high-dose atorvastatin. No change in therapy.

## 2015-05-28 ENCOUNTER — Encounter: Payer: Self-pay | Admitting: Cardiology

## 2015-05-28 ENCOUNTER — Ambulatory Visit (INDEPENDENT_AMBULATORY_CARE_PROVIDER_SITE_OTHER): Payer: Medicare PPO | Admitting: Cardiology

## 2015-05-28 VITALS — BP 132/70 | HR 54 | Ht 66.0 in | Wt 180.0 lb

## 2015-05-28 DIAGNOSIS — E785 Hyperlipidemia, unspecified: Secondary | ICD-10-CM

## 2015-05-28 DIAGNOSIS — I447 Left bundle-branch block, unspecified: Secondary | ICD-10-CM | POA: Diagnosis not present

## 2015-05-28 DIAGNOSIS — I255 Ischemic cardiomyopathy: Secondary | ICD-10-CM

## 2015-05-28 DIAGNOSIS — R001 Bradycardia, unspecified: Secondary | ICD-10-CM

## 2015-05-28 DIAGNOSIS — I25118 Atherosclerotic heart disease of native coronary artery with other forms of angina pectoris: Secondary | ICD-10-CM | POA: Diagnosis not present

## 2015-05-28 DIAGNOSIS — I10 Essential (primary) hypertension: Secondary | ICD-10-CM

## 2015-05-28 NOTE — Patient Instructions (Signed)
Your physician recommends that you schedule a follow-up appointment in: 1 month with Dr. Diona BrownerMcDowell  Your physician recommends that you continue on your current medications as directed. Please refer to the Current Medication list given to you today.  Your physician has requested that you have an echocardiogram. Echocardiography is a painless test that uses sound waves to create images of your heart. It provides your doctor with information about the size and shape of your heart and how well your heart's chambers and valves are working. This procedure takes approximately one hour. There are no restrictions for this procedure.   Thank you for choosing [facility]!!

## 2015-05-28 NOTE — Progress Notes (Signed)
Cardiology Office Note  Date: 05/28/2015   ID: Allen PuttSamuel J Ketchem, DOB 01/10/1934, MRN [id number]  PCP: Toma DeitersXAJE A HASANAJ, MD  Primary Cardiologist: Nona DellSamuel Tryce Surratt, MD   Chief Complaint  Patient presents with  . Coronary Artery Disease  . Cardiomyopathy    History of Present Illness: Allen [name] is a 81 y.o. male former patient Dr. Myrtis SerKatz, now establishing follow-up with me. I reviewed extensive records and updated his chart. This is our first meeting in the office. He last saw Dr. Myrtis SerKatz back in June.  He is here today with his niece for a follow-up visit. He states that he has been walking a mile to a mile and a half most days of the week, weather permitting. He does this at a slow pace outdoors, states that he has angina perhaps once a week, but has not had to use any nitroglycerin, it stops quickly with rest. He reports compliance with his medications which are reviewed today.  We had a long discussion today clarifying the results of his testing from May. He has a presumed ischemic cardiomyopathy, left bundle branch block at baseline, and two-vessel obstructive CAD with fairly complex vascular anatomy. Dr. Michaelle CopasSmith's cardiac catheterization note did not indicate that PCI was not an option, although it would be challenging.  He reports compliance with his current medical regimen which includes aspirin, Plavix, Norvasc, Lipitor, Lasix, Imdur, lisinopril, and potassium supplements. He has not yet had a follow-up echocardiogram to reassess LVEF. We did touch on the possibility that defibrillator may ultimately be a consideration, although perhaps not prior to at least attempting revascularization to see if his LVEF improves.  Past Medical History  Diagnosis Date  . Essential hypertension   . Hyperlipidemia   . Sinus bradycardia   . CAD (coronary artery disease)     Multivessel obstructive CAD involving the RCA, circumflex, and obtuse marginal system with only mild LAD disease May 2016  .  Ischemic cardiomyopathy     LVEF 35% May 2016  . LBBB (left bundle branch block)     Past Surgical History  Procedure Laterality Date  . Cardiac catheterization N/A 11/02/2014    Procedure: Left Heart Cath and Coronary Angiography;  Surgeon: Lyn RecordsHenry W Smith, MD;  [facility];  Service: Cardiovascular;  Laterality: N/A;    Current Outpatient Prescriptions  Medication Sig Dispense Refill  . amLODipine (NORVASC) 10 MG tablet Take 1 tablet (10 mg total) by mouth daily. 90 tablet 3  . aspirin 81 MG EC tablet Take 81 mg by mouth daily.    Marland Kitchen. atorvastatin (LIPITOR) 80 MG tablet Take 1 tablet (80 mg total) by mouth daily at 6 PM. 90 tablet 3  . clopidogrel (PLAVIX) 75 MG tablet Take 1 tablet (75 mg total) by mouth daily with breakfast. 90 tablet 3  . glimepiride (AMARYL) 2 MG tablet Take 2 mg by mouth 2 (two) times daily.    . isosorbide mononitrate (IMDUR) 60 MG 24 hr tablet Take 1 tablet (60 mg total) by mouth daily. 90 tablet 3  . LANTUS SOLOSTAR 100 UNIT/ML Solostar Pen Inject 50 Units into the skin at bedtime.    Marland Kitchen. lisinopril (PRINIVIL,ZESTRIL) 40 MG tablet Take 1 tablet (40 mg total) by mouth daily. 90 tablet 3  . metFORMIN (GLUCOPHAGE) 500 MG tablet Take 1 tablet (500 mg total) by mouth 2 (two) times daily.    . nitroGLYCERIN (NITROSTAT) 0.4 MG SL tablet Place 1 tablet (0.4 mg total) under the tongue  every 5 (five) minutes x 3 doses as needed for chest pain. 25 tablet 12   No current facility-administered medications for this visit.   Allergies:  Review of patient's allergies indicates no known allergies.   Social History: The patient  reports that he quit smoking about 3 years ago. He has never used smokeless tobacco. He reports that he does not drink alcohol or use illicit drugs.   ROS:  Please see the history of present illness. Otherwise, complete review of systems is positive for arthritic pains, currently NYHA class II dyspnea.  All other systems are reviewed and  negative.   Physical Exam: VS:  BP 132/70 mmHg  Pulse 54  Ht  (1.676 m)  Wt 180 lb (81.647 kg)  BMI 29.07 kg/m2  SpO2 96%, BMI Body mass index is 29.07 kg/(m^2).  Wt Readings from Last 3 Encounters:  05/28/15 180 lb (81.647 kg)  02/24/15 176 lb 12.8 oz (80.196 kg)  11/26/14 177 lb 12.8 oz (80.65 kg)    General: Patient appears comfortable at rest. HEENT: Conjunctiva and lids normal, oropharynx clear. Neck: Supple, mildly elevated JVP or carotid bruits, no thyromegaly. Lungs: Clear to auscultation, nonlabored breathing at rest. Cardiac: Regular rate and rhythm, S4 noted, soft systolic murmur, no pericardial rub. Abdomen: Soft, nontender, bowel sounds present, no guarding or rebound. Extremities: No pitting edema, distal pulses 2+. Skin: Warm and dry. Musculoskeletal: No kyphosis. Neuropsychiatric: Alert and oriented x3, affect grossly appropriate.  ECG: Tracing from 11/02/2014 showed sinus bradycardia at 47 with left bundle branch block.  Recent Labwork: 11/01/2014: ALT 27; AST 49*; TSH 3.413 11/03/2014: BUN 13; Creatinine, Ser 1.21; Hemoglobin 13.0; Platelets 204; Potassium 4.3; Sodium 141     Component Value Date/Time   CHOL 170 11/02/2014 0213   TRIG 121 11/02/2014 0213   HDL 45 11/02/2014 0213   CHOLHDL 3.8 11/02/2014 0213   VLDL 24 11/02/2014 0213   LDLCALC 101* 11/02/2014 0213  June 2016: BUN 15, creatinine 1.1, sodium 140, potassium 4.2   Other Studies Reviewed Today:  Cardiac catheterization 11/02/2014: 1. Prox RCA-2 lesion, 80% stenosed. 2. Prox RCA-1 lesion, 70% stenosed. 3. Ost 2nd Mrg lesion, 100% stenosed. 4. Ost 1st Mrg to 1st Mrg lesion, 70% stenosed. 5. Ost Cx to Prox Cx lesion, 75% stenosed. 6. 1st Diag lesion, 75% stenosed. 7. Prox LAD to Mid LAD lesion, 35% stenosed.   Severe 2 vessel coronary disease with tandem 70-80% RCA stenoses both proximal and distal to a shepherd's crook. The left circumflex is also severely diseased with a segmental  70-75% proximal circumflex stenosis with eccentric 70-80% first obtuse marginal stenosis. The second obtuse marginal is totally occluded and fills late by left to left and right-to-left collaterals.  Widely patent LAD with a segmental 75% first diagonal.  Mild aortic regurgitation.  Reduced LV systolic function with EF of 35%.  RECOMMENDATIONS:  Both the right coronary and circumflex contain complex disease. The LAD is widely patent. I would recommend aggressive medical therapy and if refractory symptoms consider PCI on the circumflex into the first obtuse marginal. PCI on the right coronary would be complicated due to the shepherd's crook and relatively small vessel caliber but could also be treated. In absence of significant LAD disease, coronary bypass surgery should be considered only if symptoms are not treatable medically and we decided PCI is too high risk.  I have started Plavix. Isosorbide mononitrate has also been started. I've avoided beta blocker therapy because of his bundle branch block and significant  bradycardia.  Echocardiogram 11/02/2014: Study Conclusions  - Left ventricle: The cavity size was normal. There was mild concentric hypertrophy. Systolic function was moderately to severely reduced. The estimated ejection fraction was 35%. Diffuse hypokinesis. Doppler parameters are consistent with abnormal left ventricular relaxation (grade 1 diastolic dysfunction). Doppler parameters are consistent with elevated ventricular end-diastolic filling pressure. - Ventricular septum: Septal motion showed paradox. - Aortic valve: Trileaflet; moderately thickened, mildly calcified leaflets. Cusp separation was moderately reduced. Transvalvular velocity was within the normal range. There was no stenosis. There was mild regurgitation. - Aortic root: The aortic root was normal in size. - Mitral valve: Moderately thickened, mildly calcified leaflets . There was mild  regurgitation. - Left atrium: The atrium was mildly dilated. - Right ventricle: Systolic function was normal. - Right atrium: The atrium was normal in size. - Tricuspid valve: There was mild regurgitation. - Pulmonary arteries: Systolic pressure was within the normal range. - Inferior vena cava: The vessel was normal in size. - Pericardium, extracardiac: There was no pericardial effusion.  Assessment and Plan:  1. Severe two-vessel obstructive CAD as outlined above, currently with stable angina symptoms on medical therapy. For now we will continue current regimen, encourage regular walking regimen as before. Echocardiogram will be obtained to follow up on LVEF. We will bring him back to discuss the results and continue to have discussion about whether we need to pursue revascularization attempt.  2. Presumed ischemic cardiomyopathy, LVEF was 35% as of May. As noted above a follow-up study will be obtained.  3. Essential hypertension, blood pressure is reasonable today.  4. Sinus bradycardia and left bundle branch block at baseline. Do not plan to add beta blocker at this point.  5. Hyperlipidemia, on Lipitor. LDL was 101 in May.  Current medicines were reviewed with the patient today.   Orders Placed This Encounter  Procedures  . Echocardiogram    Disposition: FU with me in 1 month.   Signed, Jonelle Sidle, MD, [facility] 05/28/2015 10:43 AM    [facility] at [facility] [address] [place], [place], [place] [address] Phone: [phone]; Fax: [phone]

## 2015-06-23 ENCOUNTER — Other Ambulatory Visit: Payer: Self-pay

## 2015-06-23 ENCOUNTER — Ambulatory Visit (INDEPENDENT_AMBULATORY_CARE_PROVIDER_SITE_OTHER): Payer: Medicare PPO

## 2015-06-23 DIAGNOSIS — I255 Ischemic cardiomyopathy: Secondary | ICD-10-CM

## 2015-06-24 ENCOUNTER — Telehealth: Payer: Self-pay | Admitting: *Deleted

## 2015-06-24 NOTE — Telephone Encounter (Signed)
-----   Message from Jonelle SidleSamuel G McDowell, MD sent at 06/23/2015 10:59 AM EST ----- Study reviewed. Compared to last evaluation, LVEF has improved now to the range of 45-50%. He is not in a position where an ICD would be considered. We will discuss at his follow-up visit in terms of medical therapy and next step.

## 2015-06-24 NOTE — Telephone Encounter (Signed)
Patient informed. 

## 2015-06-29 DIAGNOSIS — I10 Essential (primary) hypertension: Secondary | ICD-10-CM | POA: Diagnosis not present

## 2015-06-29 DIAGNOSIS — M1049 Other secondary gout, multiple sites: Secondary | ICD-10-CM | POA: Diagnosis not present

## 2015-06-29 DIAGNOSIS — E1121 Type 2 diabetes mellitus with diabetic nephropathy: Secondary | ICD-10-CM | POA: Diagnosis not present

## 2015-06-29 DIAGNOSIS — Z6832 Body mass index (BMI) 32.0-32.9, adult: Secondary | ICD-10-CM | POA: Diagnosis not present

## 2015-07-01 ENCOUNTER — Encounter: Payer: Self-pay | Admitting: Cardiology

## 2015-07-01 ENCOUNTER — Ambulatory Visit (INDEPENDENT_AMBULATORY_CARE_PROVIDER_SITE_OTHER): Payer: Medicare PPO | Admitting: Cardiology

## 2015-07-01 VITALS — BP 138/68 | HR 59 | Ht 62.0 in | Wt 181.0 lb

## 2015-07-01 DIAGNOSIS — I10 Essential (primary) hypertension: Secondary | ICD-10-CM | POA: Diagnosis not present

## 2015-07-01 DIAGNOSIS — I25118 Atherosclerotic heart disease of native coronary artery with other forms of angina pectoris: Secondary | ICD-10-CM

## 2015-07-01 DIAGNOSIS — I255 Ischemic cardiomyopathy: Secondary | ICD-10-CM

## 2015-07-01 NOTE — Patient Instructions (Signed)
Your physician recommends that you continue on your current medications as directed. Please refer to the Current Medication list given to you today. Your physician recommends that you schedule a follow-up appointment in: 6 months. You will receive a reminder letter in the mail in about 4 months reminding you to call and schedule your appointment. If you don't receive this letter, please contact our office. 

## 2015-07-01 NOTE — Progress Notes (Signed)
Cardiology Office Note  Date: 07/01/2015   ID: Allen [name], DOB Dec 17, 1933, MRN [id number]  PCP: [name], MD  Primary Cardiologist: [name], MD   Chief Complaint  Patient presents with  . Coronary Artery Disease  . Cardiomyopathy    History of Present Illness: Allen [name] is a 82 y.o. male seen by me for the first time in December 2016, a former patient of Dr. Myrtis Ser. He is here today with his daughter for a follow-up visit. Since last evaluation, he reports no significant angina symptoms on medical therapy. Still enjoys walking for exercise, usually up to a mile at a time.  Follow-up echocardiogram was recently obtained showing improvement in LVEF with underlying ischemic cardiomyopathy, no longer in range to consider ICD. We discussed the results which are outlined below.  Plan at this time is to continue continue medical therapy and observation. If he has progressive angina, I can have his films reviewed to see if complex PCI would be considered.  Past Medical History  Diagnosis Date  . Essential hypertension   . Hyperlipidemia   . Sinus bradycardia   . CAD (coronary artery disease)     Multivessel obstructive CAD involving the RCA, circumflex, and obtuse marginal system with only mild LAD disease May 2016  . Ischemic cardiomyopathy     LVEF 35% May 2016  . LBBB (left bundle branch block)     Current Outpatient Prescriptions  Medication Sig Dispense Refill  . amLODipine (NORVASC) 10 MG tablet Take 1 tablet (10 mg total) by mouth daily. 90 tablet 3  . aspirin 81 MG EC tablet Take 81 mg by mouth daily.    Marland Kitchen atorvastatin (LIPITOR) 80 MG tablet Take 1 tablet (80 mg total) by mouth daily at 6 PM. 90 tablet 3  . clopidogrel (PLAVIX) 75 MG tablet Take 1 tablet (75 mg total) by mouth daily with breakfast. 90 tablet 3  . glimepiride (AMARYL) 2 MG tablet Take 2 mg by mouth 2 (two) times daily.    . isosorbide mononitrate (IMDUR) 60 MG 24 hr tablet Take 1  tablet (60 mg total) by mouth daily. 90 tablet 3  . LANTUS SOLOSTAR 100 UNIT/ML Solostar Pen Inject 50 Units into the skin at bedtime.    Marland Kitchen lisinopril (PRINIVIL,ZESTRIL) 40 MG tablet Take 1 tablet (40 mg total) by mouth daily. 90 tablet 3  . metFORMIN (GLUCOPHAGE) 500 MG tablet Take 1 tablet (500 mg total) by mouth 2 (two) times daily.    . nitroGLYCERIN (NITROSTAT) 0.4 MG SL tablet Place 1 tablet (0.4 mg total) under the tongue every 5 (five) minutes x 3 doses as needed for chest pain. 25 tablet 12   No current facility-administered medications for this visit.   Allergies:  Review of patient's allergies indicates no known allergies.   Social History: The patient  reports that he quit smoking about 4 years ago. He has never used smokeless tobacco. He reports that he does not drink alcohol or use illicit drugs.   ROS:  Please see the history of present illness. Otherwise, complete review of systems is positive for NYHA class II dyspnea.  All other systems are reviewed and negative.   Physical Exam: VS:  BP 138/68 mmHg  Pulse 59  Ht  (1.575 m)  Wt 181 lb (82.101 kg)  BMI 33.10 kg/m2  SpO2 97%, BMI Body mass index is 33.1 kg/(m^2).  Wt Readings from Last 3 Encounters:  07/01/15 181 lb (82.101 kg)  05/28/15 180 lb (81.647 kg)  02/24/15 176 lb 12.8 oz (80.196 kg)    General: Patient appears comfortable at rest. HEENT: Conjunctiva and lids normal, oropharynx clear. Neck: Supple, mildly elevated JVP or carotid bruits, no thyromegaly. Lungs: Clear to auscultation, nonlabored breathing at rest. Cardiac: Regular rate and rhythm, S4 noted, soft systolic murmur, no pericardial rub. Abdomen: Soft, nontender, bowel sounds present, no guarding or rebound. Extremities: No pitting edema, distal pulses 2+.  ECG:  ECG from 11/02/2014 showed sinus bradycardia with left bundle branch block.  Recent Labwork: 11/01/2014: ALT 27; AST 49*; TSH 3.413 11/03/2014: BUN 13; Creatinine, Ser 1.21;  Hemoglobin 13.0; Platelets 204; Potassium 4.3; Sodium 141     Component Value Date/Time   CHOL 170 11/02/2014 0213   TRIG 121 11/02/2014 0213   HDL 45 11/02/2014 0213   CHOLHDL 3.8 11/02/2014 0213   VLDL 24 11/02/2014 0213   LDLCALC 101* 11/02/2014 0213    Other Studies Reviewed Today:  Echocardiogram 06/23/2015: Study Conclusions  - Left ventricle: The cavity size was normal. Wall thickness was increased in a pattern of mild LVH. Systolic function was mildly reduced. The estimated ejection fraction was in the range of 45% to 50%. There is severe hypokinesis of the basal-midinferior myocardium. Doppler parameters are consistent with abnormal left ventricular relaxation (grade 1 diastolic dysfunction). Normal filling pressures. - Ventricular septum: Septal motion showed abnormal function and dyssynergy. - Aortic valve: Moderately calcified annulus. Trileaflet; mildly calcified leaflets. Cusp separation was mildly reduced. There was very mild stenosis. There was mild regurgitation. Mean gradient (S): 11 mm Hg. VTI ratio of LVOT to aortic valve: 0.5. Valve area (VTI): 1.88 cm^2. Valve area (Vmax): 1.8 cm^2. Valve area (Vmean): 1.85 cm^2. - Ascending aorta: The ascending aorta was mildly ectatic. - Mitral valve: Calcified annulus. There was trivial regurgitation. - Left atrium: The atrium was at the upper limits of normal in size. - Right atrium: Central venous pressure (est): 3 mm Hg. - Atrial septum: No defect or patent foramen ovale was identified. - Tricuspid valve: There was trivial regurgitation. - Pulmonary arteries: PA peak pressure: 19 mm Hg (S). - Pericardium, extracardiac: There was no pericardial effusion.  Impressions:  - Mild LVH with LVEF 45-50%, mid to basal inferior hypokinesis consistent with ischemic cardiomyopathy. Grade 1 diastolic dysfunction with normal estimated filling pressures. Septal dyssynergy consistent with left  bundle branch block. Upper normal left atrial chamber size. MAC with trivial mitral regurgitation. Calcified aortic valve with evidence of very mild aortic stenosis and mild aortic regurgitation. Mildly ectatic ascending aorta. Normal estimated PASP 19 mmHg.  Cardiac catheterization 11/02/2014: 1. Prox RCA-2 lesion, 80% stenosed. 2. Prox RCA-1 lesion, 70% stenosed. 3. Ost 2nd Mrg lesion, 100% stenosed. 4. Ost 1st Mrg to 1st Mrg lesion, 70% stenosed. 5. Ost Cx to Prox Cx lesion, 75% stenosed. 6. 1st Diag lesion, 75% stenosed. 7. Prox LAD to Mid LAD lesion, 35% stenosed.   Severe 2 vessel coronary disease with tandem 70-80% RCA stenoses both proximal and distal to a shepherd's crook. The left circumflex is also severely diseased with a segmental 70-75% proximal circumflex stenosis with eccentric 70-80% first obtuse marginal stenosis. The second obtuse marginal is totally occluded and fills late by left to left and right-to-left collaterals.  Widely patent LAD with a segmental 75% first diagonal.  Mild aortic regurgitation.  Reduced LV systolic function with EF of 35%.  Assessment and Plan:  1. Symptomatically stable CAD as outlined above. Plan is to continue medical therapy and observation  for now. If he has progressive angina, complex PCI could be considered.  2. Ischemic cardiomyopathy with improvement in LVEF, now on the range of 45-50%.  3. Essential hypertension, blood pressure control is adequate today.  Current medicines were reviewed with the patient today.  Disposition: FU with me in 6 months.   Signed, Jonelle Sidle, MD, [facility] 07/01/2015 9:34 AM    [facility] at [facility] [address] [place], [place], [place] [address] Phone: [phone]; Fax: [phone]

## 2015-07-09 DIAGNOSIS — Z7984 Long term (current) use of oral hypoglycemic drugs: Secondary | ICD-10-CM | POA: Diagnosis not present

## 2015-07-09 DIAGNOSIS — J209 Acute bronchitis, unspecified: Secondary | ICD-10-CM | POA: Diagnosis not present

## 2015-07-09 DIAGNOSIS — E119 Type 2 diabetes mellitus without complications: Secondary | ICD-10-CM | POA: Diagnosis not present

## 2015-07-09 DIAGNOSIS — I252 Old myocardial infarction: Secondary | ICD-10-CM | POA: Diagnosis not present

## 2015-07-09 DIAGNOSIS — Z794 Long term (current) use of insulin: Secondary | ICD-10-CM | POA: Diagnosis not present

## 2015-07-09 DIAGNOSIS — R0602 Shortness of breath: Secondary | ICD-10-CM | POA: Diagnosis not present

## 2015-07-09 DIAGNOSIS — Z79899 Other long term (current) drug therapy: Secondary | ICD-10-CM | POA: Diagnosis not present

## 2015-07-09 DIAGNOSIS — Z7902 Long term (current) use of antithrombotics/antiplatelets: Secondary | ICD-10-CM | POA: Diagnosis not present

## 2015-07-09 DIAGNOSIS — R05 Cough: Secondary | ICD-10-CM | POA: Diagnosis not present

## 2015-07-09 DIAGNOSIS — Z7982 Long term (current) use of aspirin: Secondary | ICD-10-CM | POA: Diagnosis not present

## 2015-07-09 DIAGNOSIS — I10 Essential (primary) hypertension: Secondary | ICD-10-CM | POA: Diagnosis not present

## 2015-07-12 DIAGNOSIS — H1033 Unspecified acute conjunctivitis, bilateral: Secondary | ICD-10-CM | POA: Diagnosis not present

## 2015-07-14 DIAGNOSIS — H1032 Unspecified acute conjunctivitis, left eye: Secondary | ICD-10-CM | POA: Diagnosis not present

## 2015-09-27 DIAGNOSIS — E1121 Type 2 diabetes mellitus with diabetic nephropathy: Secondary | ICD-10-CM | POA: Diagnosis not present

## 2015-09-27 DIAGNOSIS — M1049 Other secondary gout, multiple sites: Secondary | ICD-10-CM | POA: Diagnosis not present

## 2015-09-27 DIAGNOSIS — Z6832 Body mass index (BMI) 32.0-32.9, adult: Secondary | ICD-10-CM | POA: Diagnosis not present

## 2015-09-27 DIAGNOSIS — A932 Colorado tick fever: Secondary | ICD-10-CM | POA: Diagnosis not present

## 2015-09-27 DIAGNOSIS — I10 Essential (primary) hypertension: Secondary | ICD-10-CM | POA: Diagnosis not present

## 2015-11-01 ENCOUNTER — Other Ambulatory Visit: Payer: Self-pay | Admitting: Cardiology

## 2015-11-11 DIAGNOSIS — I6523 Occlusion and stenosis of bilateral carotid arteries: Secondary | ICD-10-CM | POA: Diagnosis not present

## 2015-11-11 DIAGNOSIS — M47812 Spondylosis without myelopathy or radiculopathy, cervical region: Secondary | ICD-10-CM | POA: Diagnosis not present

## 2015-11-11 DIAGNOSIS — Z6832 Body mass index (BMI) 32.0-32.9, adult: Secondary | ICD-10-CM | POA: Diagnosis not present

## 2015-11-11 DIAGNOSIS — M542 Cervicalgia: Secondary | ICD-10-CM | POA: Diagnosis not present

## 2015-12-01 ENCOUNTER — Other Ambulatory Visit: Payer: Self-pay | Admitting: *Deleted

## 2015-12-01 MED ORDER — NITROGLYCERIN 0.4 MG SL SUBL: 0.4000 mg | SUBLINGUAL_TABLET | SUBLINGUAL | Status: DC | PRN

## 2015-12-03 ENCOUNTER — Other Ambulatory Visit: Payer: Self-pay | Admitting: *Deleted

## 2015-12-03 MED ORDER — NITROGLYCERIN 0.4 MG SL SUBL: 0.4000 mg | SUBLINGUAL_TABLET | SUBLINGUAL | Status: DC | PRN

## 2015-12-17 ENCOUNTER — Ambulatory Visit: Payer: Medicare PPO | Admitting: Cardiology

## 2015-12-31 DIAGNOSIS — E1121 Type 2 diabetes mellitus with diabetic nephropathy: Secondary | ICD-10-CM | POA: Diagnosis not present

## 2015-12-31 DIAGNOSIS — Z6832 Body mass index (BMI) 32.0-32.9, adult: Secondary | ICD-10-CM | POA: Diagnosis not present

## 2015-12-31 DIAGNOSIS — K210 Gastro-esophageal reflux disease with esophagitis: Secondary | ICD-10-CM | POA: Diagnosis not present

## 2015-12-31 DIAGNOSIS — M542 Cervicalgia: Secondary | ICD-10-CM | POA: Diagnosis not present

## 2015-12-31 DIAGNOSIS — I2584 Coronary atherosclerosis due to calcified coronary lesion: Secondary | ICD-10-CM | POA: Diagnosis not present

## 2016-01-12 DIAGNOSIS — K210 Gastro-esophageal reflux disease with esophagitis: Secondary | ICD-10-CM | POA: Diagnosis not present

## 2016-01-12 DIAGNOSIS — I2584 Coronary atherosclerosis due to calcified coronary lesion: Secondary | ICD-10-CM | POA: Diagnosis not present

## 2016-01-12 DIAGNOSIS — M542 Cervicalgia: Secondary | ICD-10-CM | POA: Diagnosis not present

## 2016-01-12 DIAGNOSIS — E1121 Type 2 diabetes mellitus with diabetic nephropathy: Secondary | ICD-10-CM | POA: Diagnosis not present

## 2016-01-23 ENCOUNTER — Other Ambulatory Visit: Payer: Self-pay | Admitting: Cardiology

## 2016-01-25 DIAGNOSIS — R3912 Poor urinary stream: Secondary | ICD-10-CM | POA: Diagnosis not present

## 2016-01-25 DIAGNOSIS — R350 Frequency of micturition: Secondary | ICD-10-CM | POA: Diagnosis not present

## 2016-01-25 DIAGNOSIS — N401 Enlarged prostate with lower urinary tract symptoms: Secondary | ICD-10-CM | POA: Diagnosis not present

## 2016-01-25 DIAGNOSIS — R351 Nocturia: Secondary | ICD-10-CM | POA: Diagnosis not present

## 2016-01-26 ENCOUNTER — Ambulatory Visit (INDEPENDENT_AMBULATORY_CARE_PROVIDER_SITE_OTHER): Payer: Medicare PPO | Admitting: Cardiology

## 2016-01-26 ENCOUNTER — Encounter: Payer: Self-pay | Admitting: Cardiology

## 2016-01-26 VITALS — BP 137/65 | HR 43 | Ht 62.0 in | Wt 180.2 lb

## 2016-01-26 DIAGNOSIS — I447 Left bundle-branch block, unspecified: Secondary | ICD-10-CM

## 2016-01-26 DIAGNOSIS — I25118 Atherosclerotic heart disease of native coronary artery with other forms of angina pectoris: Secondary | ICD-10-CM | POA: Diagnosis not present

## 2016-01-26 DIAGNOSIS — I10 Essential (primary) hypertension: Secondary | ICD-10-CM | POA: Diagnosis not present

## 2016-01-26 DIAGNOSIS — I255 Ischemic cardiomyopathy: Secondary | ICD-10-CM | POA: Diagnosis not present

## 2016-01-26 NOTE — Patient Instructions (Signed)

## 2016-01-26 NOTE — Progress Notes (Signed)
Cardiology Office Note  Date: 01/26/2016   ID: Allen [name], DOB 21-Jan-1934, MRN [id number]  PCP: [name], MD  Primary Cardiologist: [name], MD   Chief Complaint  Patient presents with  . Coronary Artery Disease    History of Present Illness: Allen [name] is a 82 y.o. male last seen in January. He presents today with his daughter for a follow-up visit. Reports stable angina symptoms, has not actually had to use any nitroglycerin however. He continues on the medications outlined below. He reports NYHA class II dyspnea although is fairly inactive.  I reviewed his ECG today which shows sinus bradycardia with left bundle branch block. He is not on any heart rate lowering medications at this time. Current cardiac regimen includes aspirin, Plavix, Lipitor, Norvasc, Imdur, lisinopril, and as needed nitroglycerin.  Past Medical History:  Diagnosis Date  . CAD (coronary artery disease)    Multivessel obstructive CAD involving the RCA, circumflex, and obtuse marginal system with only mild LAD disease May 2016  . Essential hypertension   . Hyperlipidemia   . Ischemic cardiomyopathy    LVEF 35% May 2016  . LBBB (left bundle branch block)   . Sinus bradycardia     Current Outpatient Prescriptions  Medication Sig Dispense Refill  . amLODipine (NORVASC) 10 MG tablet TAKE 1 TABLET ONE TIME DAILY 90 tablet 3  . aspirin 81 MG EC tablet Take 81 mg by mouth daily.    Marland Kitchen atorvastatin (LIPITOR) 80 MG tablet Take 1 tablet (80 mg total) by mouth daily at 6 PM. 90 tablet 3  . clopidogrel (PLAVIX) 75 MG tablet TAKE 1 TABLET ONE TIME DAILY WITH BREAKFAST 90 tablet 3  . glimepiride (AMARYL) 2 MG tablet Take 2 mg by mouth 2 (two) times daily.    . isosorbide mononitrate (IMDUR) 60 MG 24 hr tablet TAKE 1 TABLET ONE TIME DAILY 90 tablet 3  . LANTUS SOLOSTAR 100 UNIT/ML Solostar Pen Inject 50 Units into the skin at bedtime.    Marland Kitchen lisinopril (PRINIVIL,ZESTRIL) 40 MG tablet TAKE 1 TABLET  EVERY DAY 90 tablet 3  . metFORMIN (GLUCOPHAGE) 500 MG tablet Take 1 tablet (500 mg total) by mouth 2 (two) times daily.    . nitroGLYCERIN (NITROSTAT) 0.4 MG SL tablet Place 1 tablet (0.4 mg total) under the tongue every 5 (five) minutes x 3 doses as needed for chest pain. If no relief after 3 rd, proceed to the ED for an evaluation 25 tablet 3   No current facility-administered medications for this visit.    Allergies:  Review of patient's allergies indicates no known allergies.   Social History: The patient  reports that he quit smoking about 4 years ago. He has never used smokeless tobacco. He reports that he does not drink alcohol or use drugs.   ROS:  Please see the history of present illness. Otherwise, complete review of systems is positive for arthritic stiffness.  All other systems are reviewed and negative.   Physical Exam: VS:  BP 137/65   Pulse (!) 43   Ht 5\' 2"  (1.575 m)   Wt 180 lb 3.2 oz (81.7 kg)   SpO2 95%   BMI 32.96 kg/m , BMI Body mass index is 32.96 kg/m.  Wt Readings from Last 3 Encounters:  01/26/16 180 lb 3.2 oz (81.7 kg)  07/01/15 181 lb (82.1 kg)  05/28/15 180 lb (81.6 kg)    General: Patient appears comfortable at rest. HEENT: Conjunctiva and lids normal,  oropharynx clear. Neck: Supple, mildly elevated JVP or carotid bruits, no thyromegaly. Lungs: Clear to auscultation, nonlabored breathing at rest. Cardiac: Regular rate and rhythm, S4 noted, soft systolic murmur, no pericardial rub. Abdomen: Soft, nontender, bowel sounds present, no guarding or rebound. Extremities: No pitting edema, distal pulses 2+.  ECG: I personally reviewed the tracing from 11/02/2014 which showed sinus bradycardia with left bundle branch block.  Recent Labwork: No results found for requested labs within last 8760 hours.     Component Value Date/Time   CHOL 170 11/02/2014 0213   TRIG 121 11/02/2014 0213   HDL 45 11/02/2014 0213   CHOLHDL 3.8 11/02/2014 0213   VLDL 24  11/02/2014 0213   LDLCALC 101 (H) 11/02/2014 0213    Other Studies Reviewed Today:  Echocardiogram 06/23/2015: Study Conclusions  - Left ventricle: The cavity size was normal. Wall thickness was increased in a pattern of mild LVH. Systolic function was mildly reduced. The estimated ejection fraction was in the range of 45% to 50%. There is severe hypokinesis of the basal-midinferior myocardium. Doppler parameters are consistent with abnormal left ventricular relaxation (grade 1 diastolic dysfunction). Normal filling pressures. - Ventricular septum: Septal motion showed abnormal function and dyssynergy. - Aortic valve: Moderately calcified annulus. Trileaflet; mildly calcified leaflets. Cusp separation was mildly reduced. There was very mild stenosis. There was mild regurgitation. Mean gradient (S): 11 mm Hg. VTI ratio of LVOT to aortic valve: 0.5. Valve area (VTI): 1.88 cm^2. Valve area (Vmax): 1.8 cm^2. Valve area (Vmean): 1.85 cm^2. - Ascending aorta: The ascending aorta was mildly ectatic. - Mitral valve: Calcified annulus. There was trivial regurgitation. - Left atrium: The atrium was at the upper limits of normal in size. - Right atrium: Central venous pressure (est): 3 mm Hg. - Atrial septum: No defect or patent foramen ovale was identified. - Tricuspid valve: There was trivial regurgitation. - Pulmonary arteries: PA peak pressure: 19 mm Hg (S). - Pericardium, extracardiac: There was no pericardial effusion.  Impressions:  - Mild LVH with LVEF 45-50%, mid to basal inferior hypokinesis consistent with ischemic cardiomyopathy. Grade 1 diastolic dysfunction with normal estimated filling pressures. Septal dyssynergy consistent with left bundle branch block. Upper normal left atrial chamber size. MAC with trivial mitral regurgitation. Calcified aortic valve with evidence of very mild aortic stenosis and mild aortic regurgitation. Mildly  ectatic ascending aorta. Normal estimated PASP 19 mmHg.  Cardiac catheterization 11/02/2014: 1. Prox RCA-2 lesion, 80% stenosed. 2. Prox RCA-1 lesion, 70% stenosed. 3. Ost 2nd Mrg lesion, 100% stenosed. 4. Ost 1st Mrg to 1st Mrg lesion, 70% stenosed. 5. Ost Cx to Prox Cx lesion, 75% stenosed. 6. 1st Diag lesion, 75% stenosed. 7. Prox LAD to Mid LAD lesion, 35% stenosed.   Severe 2 vessel coronary disease with tandem 70-80% RCA stenoses both proximal and distal to a shepherd's crook. The left circumflex is also severely diseased with a segmental 70-75% proximal circumflex stenosis with eccentric 70-80% first obtuse marginal stenosis. The second obtuse marginal is totally occluded and fills late by left to left and right-to-left collaterals.  Widely patent LAD with a segmental 75% first diagonal.  Mild aortic regurgitation.  Reduced LV systolic function with EF of 35%.  Assessment and Plan:  1. Multivessel CAD as outlined above, predominantly RCA and circumflex distribution. Plan is to try and continue with medical therapy although possible complex PCI could be considered if symptoms escalate. He is bradycardic with left bundle branch block, likely conduction system disease, and we do not have him  on any heart rate lowering medications as antianginal at this time.  2. Essential hypertension, blood pressure is adequately controlled today.  3. Ischemic cardiomyopathy, LVEF 45-50% range by most recent echocardiogram.  Current medicines were reviewed with the patient today.   Orders Placed This Encounter  Procedures  . EKG 12-Lead    Disposition: Follow-up with me in 6 months.  Signed, Jonelle SidleSamuel G. Jewelia Bocchino, MD, [facility] 01/26/2016 1:20 PM    [facility] [address], [place], [place] [address] Phone: [phone]; Fax: [phone]

## 2016-02-17 DIAGNOSIS — M542 Cervicalgia: Secondary | ICD-10-CM | POA: Diagnosis not present

## 2016-02-17 DIAGNOSIS — K210 Gastro-esophageal reflux disease with esophagitis: Secondary | ICD-10-CM | POA: Diagnosis not present

## 2016-02-17 DIAGNOSIS — I2584 Coronary atherosclerosis due to calcified coronary lesion: Secondary | ICD-10-CM | POA: Diagnosis not present

## 2016-02-17 DIAGNOSIS — E1121 Type 2 diabetes mellitus with diabetic nephropathy: Secondary | ICD-10-CM | POA: Diagnosis not present

## 2016-04-14 DIAGNOSIS — K210 Gastro-esophageal reflux disease with esophagitis: Secondary | ICD-10-CM | POA: Diagnosis not present

## 2016-04-14 DIAGNOSIS — E1121 Type 2 diabetes mellitus with diabetic nephropathy: Secondary | ICD-10-CM | POA: Diagnosis not present

## 2016-04-14 DIAGNOSIS — M542 Cervicalgia: Secondary | ICD-10-CM | POA: Diagnosis not present

## 2016-04-14 DIAGNOSIS — I2584 Coronary atherosclerosis due to calcified coronary lesion: Secondary | ICD-10-CM | POA: Diagnosis not present

## 2016-04-17 DIAGNOSIS — R079 Chest pain, unspecified: Secondary | ICD-10-CM | POA: Diagnosis not present

## 2016-04-17 DIAGNOSIS — R0789 Other chest pain: Secondary | ICD-10-CM | POA: Diagnosis not present

## 2016-04-17 DIAGNOSIS — Z1389 Encounter for screening for other disorder: Secondary | ICD-10-CM | POA: Diagnosis not present

## 2016-04-17 DIAGNOSIS — Z7902 Long term (current) use of antithrombotics/antiplatelets: Secondary | ICD-10-CM | POA: Diagnosis not present

## 2016-04-17 DIAGNOSIS — E118 Type 2 diabetes mellitus with unspecified complications: Secondary | ICD-10-CM | POA: Diagnosis not present

## 2016-04-17 DIAGNOSIS — R008 Other abnormalities of heart beat: Secondary | ICD-10-CM | POA: Diagnosis not present

## 2016-04-17 DIAGNOSIS — Z794 Long term (current) use of insulin: Secondary | ICD-10-CM | POA: Diagnosis not present

## 2016-04-17 DIAGNOSIS — E784 Other hyperlipidemia: Secondary | ICD-10-CM | POA: Diagnosis not present

## 2016-04-17 DIAGNOSIS — Z7984 Long term (current) use of oral hypoglycemic drugs: Secondary | ICD-10-CM | POA: Diagnosis not present

## 2016-04-17 DIAGNOSIS — Z7982 Long term (current) use of aspirin: Secondary | ICD-10-CM | POA: Diagnosis not present

## 2016-04-17 DIAGNOSIS — E785 Hyperlipidemia, unspecified: Secondary | ICD-10-CM | POA: Diagnosis not present

## 2016-04-17 DIAGNOSIS — E119 Type 2 diabetes mellitus without complications: Secondary | ICD-10-CM | POA: Diagnosis not present

## 2016-04-17 DIAGNOSIS — Z0000 Encounter for general adult medical examination without abnormal findings: Secondary | ICD-10-CM | POA: Diagnosis not present

## 2016-04-17 DIAGNOSIS — I10 Essential (primary) hypertension: Secondary | ICD-10-CM | POA: Diagnosis not present

## 2016-04-17 DIAGNOSIS — R001 Bradycardia, unspecified: Secondary | ICD-10-CM | POA: Diagnosis not present

## 2016-04-18 DIAGNOSIS — Z7982 Long term (current) use of aspirin: Secondary | ICD-10-CM | POA: Diagnosis not present

## 2016-04-18 DIAGNOSIS — Z7984 Long term (current) use of oral hypoglycemic drugs: Secondary | ICD-10-CM | POA: Diagnosis not present

## 2016-04-18 DIAGNOSIS — Z794 Long term (current) use of insulin: Secondary | ICD-10-CM | POA: Diagnosis not present

## 2016-04-18 DIAGNOSIS — R079 Chest pain, unspecified: Secondary | ICD-10-CM | POA: Diagnosis not present

## 2016-04-18 DIAGNOSIS — E119 Type 2 diabetes mellitus without complications: Secondary | ICD-10-CM | POA: Diagnosis not present

## 2016-04-18 DIAGNOSIS — R008 Other abnormalities of heart beat: Secondary | ICD-10-CM | POA: Diagnosis not present

## 2016-04-18 DIAGNOSIS — R001 Bradycardia, unspecified: Secondary | ICD-10-CM | POA: Diagnosis not present

## 2016-04-18 DIAGNOSIS — R0789 Other chest pain: Secondary | ICD-10-CM | POA: Diagnosis not present

## 2016-04-18 DIAGNOSIS — E118 Type 2 diabetes mellitus with unspecified complications: Secondary | ICD-10-CM | POA: Diagnosis not present

## 2016-04-18 DIAGNOSIS — E784 Other hyperlipidemia: Secondary | ICD-10-CM | POA: Diagnosis not present

## 2016-04-18 DIAGNOSIS — Z7902 Long term (current) use of antithrombotics/antiplatelets: Secondary | ICD-10-CM | POA: Diagnosis not present

## 2016-04-18 DIAGNOSIS — I10 Essential (primary) hypertension: Secondary | ICD-10-CM | POA: Diagnosis not present

## 2016-04-18 DIAGNOSIS — E785 Hyperlipidemia, unspecified: Secondary | ICD-10-CM | POA: Diagnosis not present

## 2016-04-20 DIAGNOSIS — R931 Abnormal findings on diagnostic imaging of heart and coronary circulation: Secondary | ICD-10-CM | POA: Diagnosis not present

## 2016-04-20 DIAGNOSIS — I252 Old myocardial infarction: Secondary | ICD-10-CM | POA: Diagnosis not present

## 2016-04-20 DIAGNOSIS — R079 Chest pain, unspecified: Secondary | ICD-10-CM | POA: Diagnosis not present

## 2016-05-01 ENCOUNTER — Encounter: Payer: Self-pay | Admitting: Cardiology

## 2016-05-01 ENCOUNTER — Ambulatory Visit (INDEPENDENT_AMBULATORY_CARE_PROVIDER_SITE_OTHER): Payer: Medicare PPO | Admitting: Cardiology

## 2016-05-01 VITALS — BP 145/71 | HR 68 | Ht 62.0 in | Wt 178.0 lb

## 2016-05-01 DIAGNOSIS — I25709 Atherosclerosis of coronary artery bypass graft(s), unspecified, with unspecified angina pectoris: Secondary | ICD-10-CM | POA: Diagnosis not present

## 2016-05-01 DIAGNOSIS — K219 Gastro-esophageal reflux disease without esophagitis: Secondary | ICD-10-CM

## 2016-05-01 DIAGNOSIS — I255 Ischemic cardiomyopathy: Secondary | ICD-10-CM | POA: Diagnosis not present

## 2016-05-01 DIAGNOSIS — I447 Left bundle-branch block, unspecified: Secondary | ICD-10-CM

## 2016-05-01 DIAGNOSIS — I10 Essential (primary) hypertension: Secondary | ICD-10-CM

## 2016-05-01 DIAGNOSIS — R9439 Abnormal result of other cardiovascular function study: Secondary | ICD-10-CM

## 2016-05-01 MED ORDER — PANTOPRAZOLE SODIUM 20 MG PO TBEC: 20.0000 mg | DELAYED_RELEASE_TABLET | Freq: Every day | ORAL | 1 refills | Status: DC

## 2016-05-01 MED ORDER — PANTOPRAZOLE SODIUM 20 MG PO TBEC: 20.0000 mg | DELAYED_RELEASE_TABLET | Freq: Every day | ORAL | 3 refills | Status: DC

## 2016-05-01 NOTE — Progress Notes (Signed)
Cardiology Office Note  Date: 05/01/2016   ID: [name], DOB [birth year], MRN [id number]  PCP: Allen Deiters, MD  Primary Cardiologist: Allen Dell, MD   Chief Complaint  Patient presents with  . Coronary Artery Disease    History of Present Illness: Allen [name] is an 82 y.o. male last seen in August. He is referred back to the office by Dr. Olena [name]. He is here today with his daughter. I do not have complete information, but he was apparently admitted to [facility] recently with thoracic discomfort. He underwent stress testing as detailed below. He describes a sharp, sometimes raw sensation pointing to his midline from epigastric region up to the throat. This occurs sporadically, not specifically with exertion, sometimes when he gets up in the morning. He has used Zantac recently with some improvement.  Recent Myoview done at [facility] in early November reported LVEF 32% with small region of inferior apical ischemia. His cardiac catheterization from last year as outlined below. We discussed these results today.  I went over his medications which are outlined below. With suspicion for possible GI etiology of his symptoms, we discussed adding a prescription PPI to see if this is helpful. Also obtaining an echocardiogram to reevaluate LVEF and determine if there has been an actual decline since January. We have certainly not excluded pursuing cardiac catheterization and possible PCI, although this would likely be a complex intervention.  Past Medical History:  Diagnosis Date  . CAD (coronary artery disease)    Multivessel obstructive CAD involving the RCA, circumflex, and obtuse marginal system with only mild LAD disease May 2016  . Essential hypertension   . Hyperlipidemia   . Ischemic cardiomyopathy    LVEF 35% May 2016  . LBBB (left bundle branch block)   . Sinus bradycardia     Past Surgical History:  Procedure Laterality Date  . CARDIAC CATHETERIZATION N/A  11/02/2014   Procedure: Left Heart Cath and Coronary Angiography;  Surgeon: Lyn Records, MD;  Location: [facility] INVASIVE CV LAB;  Service: Cardiovascular;  Laterality: N/A;    Current Outpatient Prescriptions  Medication Sig Dispense Refill  . amLODipine (NORVASC) 10 MG tablet TAKE 1 TABLET ONE TIME DAILY 90 tablet 3  . aspirin 81 MG EC tablet Take 81 mg by mouth daily.    Marland Kitchen atorvastatin (LIPITOR) 80 MG tablet Take 1 tablet (80 mg total) by mouth daily at 6 PM. 90 tablet 3  . clopidogrel (PLAVIX) 75 MG tablet TAKE 1 TABLET ONE TIME DAILY WITH BREAKFAST 90 tablet 3  . glimepiride (AMARYL) 2 MG tablet Take 2 mg by mouth 2 (two) times daily.    . isosorbide mononitrate (IMDUR) 60 MG 24 hr tablet TAKE 1 TABLET ONE TIME DAILY 90 tablet 3  . LANTUS SOLOSTAR 100 UNIT/ML Solostar Pen Inject 50 Units into the skin at bedtime.    Marland Kitchen lisinopril (PRINIVIL,ZESTRIL) 40 MG tablet TAKE 1 TABLET EVERY DAY 90 tablet 3  . metFORMIN (GLUCOPHAGE) 500 MG tablet Take 1 tablet (500 mg total) by mouth 2 (two) times daily.    . nitroGLYCERIN (NITROSTAT) 0.4 MG SL tablet Place 1 tablet (0.4 mg total) under the tongue every 5 (five) minutes x 3 doses as needed for chest pain. If no relief after 3 rd, proceed to the ED for an evaluation 25 tablet 3   No current facility-administered medications for this visit.    Allergies:  Patient has no known allergies.   Social History: The patient  reports that he quit smoking about 4 years ago. He has never used smokeless tobacco. He reports that he does not drink alcohol or use drugs.   ROS:  Please see the history of present illness. Otherwise, complete review of systems is positive for reflux.  All other systems are reviewed and negative.   Physical Exam: VS:  BP (!) 145/71   Pulse 68   Ht 5\' 2"  (1.575 m)   Wt 178 lb (80.7 kg)   SpO2 94%   BMI 32.56 kg/m , BMI Body mass index is 32.56 kg/m.  Wt Readings from Last 3 Encounters:  05/01/16 178 lb (80.7 kg)  01/26/16 180 lb  3.2 oz (81.7 kg)  07/01/15 181 lb (82.1 kg)    General: Elderly male, appears comfortable at rest. HEENT: Conjunctiva and lids normal, oropharynx clear. Neck: Supple, mildly elevated JVP or carotid bruits, no thyromegaly. Lungs: Clear to auscultation, nonlabored breathing at rest. Cardiac: Regular rate and rhythm, S4 noted, 2/6 systolic murmur, no pericardial rub. Abdomen: Soft, nontender, bowel sounds present, no guarding or rebound. Extremities: No pitting edema, distal pulses 2+. Skin: Warm and dry. Musculoskeletal: No kyphosis. Neuropsychiatric: Alert and oriented 3, affect appropriate.  ECG: I personally reviewed the tracing from 01/26/2016 which showed sinus bradycardia with left bundle branch block.   Recent Labwork:    Component Value Date/Time   CHOL 170 11/02/2014 0213   TRIG 121 11/02/2014 0213   HDL 45 11/02/2014 0213   CHOLHDL 3.8 11/02/2014 0213   VLDL 24 11/02/2014 0213   LDLCALC 101 (H) 11/02/2014 0213  June 2016: BUN 15, creatinine 1.1, potassium 4.2  Other Studies Reviewed Today:  Echocardiogram 06/23/2015: Study Conclusions  - Left ventricle: The cavity size was normal. Wall thickness was increased in a pattern of mild LVH. Systolic function was mildly reduced. The estimated ejection fraction was in the range of 45% to 50%. There is severe hypokinesis of the basal-midinferior myocardium. Doppler parameters are consistent with abnormal left ventricular relaxation (grade 1 diastolic dysfunction). Normal filling pressures. - Ventricular septum: Septal motion showed abnormal function and dyssynergy. - Aortic valve: Moderately calcified annulus. Trileaflet; mildly calcified leaflets. Cusp separation was mildly reduced. There was very mild stenosis. There was mild regurgitation. Mean gradient (S): 11 mm Hg. VTI ratio of LVOT to aortic valve: 0.5. Valve area (VTI): 1.88 cm^2. Valve area (Vmax): 1.8 cm^2. Valve area (Vmean): 1.85  cm^2. - Ascending aorta: The ascending aorta was mildly ectatic. - Mitral valve: Calcified annulus. There was trivial regurgitation. - Left atrium: The atrium was at the upper limits of normal in size. - Right atrium: Central venous pressure (est): 3 mm Hg. - Atrial septum: No defect or patent foramen ovale was identified. - Tricuspid valve: There was trivial regurgitation. - Pulmonary arteries: PA peak pressure: 19 mm Hg (S). - Pericardium, extracardiac: There was no pericardial effusion.  Impressions:  - Mild LVH with LVEF 45-50%, mid to basal inferior hypokinesis consistent with ischemic cardiomyopathy. Grade 1 diastolic dysfunction with normal estimated filling pressures. Septal dyssynergy consistent with left bundle branch block. Upper normal left atrial chamber size. MAC with trivial mitral regurgitation. Calcified aortic valve with evidence of very mild aortic stenosis and mild aortic regurgitation. Mildly ectatic ascending aorta. Normal estimated PASP 19 mmHg.  Cardiac catheterization 11/02/2014: 1. Prox RCA-2 lesion, 80% stenosed. 2. Prox RCA-1 lesion, 70% stenosed. 3. Ost 2nd Mrg lesion, 100% stenosed. 4. Ost 1st Mrg to 1st Mrg lesion, 70% stenosed. 5. Ost Cx to Prox Cx lesion,  75% stenosed. 6. 1st Diag lesion, 75% stenosed. 7. Prox LAD to Mid LAD lesion, 35% stenosed.   Severe 2 vessel coronary disease with tandem 70-80% RCA stenoses both proximal and distal to a shepherd's crook. The left circumflex is also severely diseased with a segmental 70-75% proximal circumflex stenosis with eccentric 70-80% first obtuse marginal stenosis. The second obtuse marginal is totally occluded and fills late by left to left and right-to-left collaterals.  Widely patent LAD with a segmental 75% first diagonal.  Mild aortic regurgitation.  Reduced LV systolic function with EF of 35%.  Assessment and Plan:  1. Recurrent thoracic discomfort, description suggestive of  GI etiology/reflux. After discussion today we will plan to add a prescription proton pump inhibitor and see how he does clinically.  2. Ischemic heart disease, severe 2 vessel disease in the RCA and circumflex distribution documented at cardiac catheterization last year. Stress testing showed only mild inferior apical ischemia however. Reduction in LVEF made the test "high risk," although his LVEF by echocardiogram from earlier this year was only mildly reduced. We will continue medical therapy and follow-up with repeat echocardiogram. Office follow-up arranged.  3. Essential hypertension, continue Norvasc, Imdur, and lisinopril.  4. Resting bradycardia with left bundle branch block. We do not have him on a beta blocker.  Current medicines were reviewed with the patient today.   Orders Placed This Encounter  Procedures  . ECHOCARDIOGRAM COMPLETE    Disposition: Follow-up with me in 4 weeks.  Signed, Jonelle SidleSamuel G. Kendle Erker, MD, [facility] 05/01/2016 2:10 PM    [facility] [address], [place], [place] [address] Phone: [phone]; Fax: [phone]

## 2016-05-01 NOTE — Patient Instructions (Addendum)
Your physician recommends that you schedule a follow-up appointment in: 4-6 weeks [address] Purvis SheffieldKoneswaran  Start protonix 20 mg daily  Your physician has requested that you have an echocardiogram. Echocardiography is a painless test that uses sound waves to create images of your heart. It provides your doctor with information about the size and shape of your heart and how well your heart's chambers and valves are working. This procedure takes approximately one hour. There are no restrictions for this procedure.      Thank you for choosing [facility] !

## 2016-05-03 ENCOUNTER — Encounter (HOSPITAL_COMMUNITY)
Admission: EM | Disposition: A | Discharge status: Home / Self Care | Payer: Self-pay | Source: Home / Self Care | Attending: Surgery

## 2016-05-03 ENCOUNTER — Emergency Department (HOSPITAL_COMMUNITY): Payer: Medicare PPO

## 2016-05-03 ENCOUNTER — Encounter (HOSPITAL_COMMUNITY): Payer: Self-pay | Admitting: Emergency Medicine

## 2016-05-03 ENCOUNTER — Inpatient Hospital Stay (HOSPITAL_COMMUNITY)
Admission: EM | Admit: 2016-05-03 | Discharge: 2016-05-16 | DRG: 234 | Disposition: A | Discharge status: Home / Self Care | Payer: Medicare PPO | Attending: Surgery | Admitting: Surgery

## 2016-05-03 DIAGNOSIS — I495 Sick sinus syndrome: Secondary | ICD-10-CM | POA: Diagnosis present

## 2016-05-03 DIAGNOSIS — D696 Thrombocytopenia, unspecified: Secondary | ICD-10-CM | POA: Diagnosis not present

## 2016-05-03 DIAGNOSIS — Z87891 Personal history of nicotine dependence: Secondary | ICD-10-CM | POA: Diagnosis not present

## 2016-05-03 DIAGNOSIS — I700 Atherosclerosis of aorta: Secondary | ICD-10-CM | POA: Diagnosis present

## 2016-05-03 DIAGNOSIS — R609 Edema, unspecified: Secondary | ICD-10-CM | POA: Diagnosis not present

## 2016-05-03 DIAGNOSIS — I350 Nonrheumatic aortic (valve) stenosis: Secondary | ICD-10-CM | POA: Diagnosis not present

## 2016-05-03 DIAGNOSIS — I213 ST elevation (STEMI) myocardial infarction of unspecified site: Secondary | ICD-10-CM | POA: Diagnosis not present

## 2016-05-03 DIAGNOSIS — N183 Chronic kidney disease, stage 3 (moderate): Secondary | ICD-10-CM | POA: Diagnosis not present

## 2016-05-03 DIAGNOSIS — J9811 Atelectasis: Secondary | ICD-10-CM | POA: Diagnosis not present

## 2016-05-03 DIAGNOSIS — E785 Hyperlipidemia, unspecified: Secondary | ICD-10-CM | POA: Diagnosis present

## 2016-05-03 DIAGNOSIS — G4733 Obstructive sleep apnea (adult) (pediatric): Secondary | ICD-10-CM | POA: Diagnosis present

## 2016-05-03 DIAGNOSIS — N179 Acute kidney failure, unspecified: Secondary | ICD-10-CM | POA: Diagnosis not present

## 2016-05-03 DIAGNOSIS — I340 Nonrheumatic mitral (valve) insufficiency: Secondary | ICD-10-CM | POA: Diagnosis present

## 2016-05-03 DIAGNOSIS — K5900 Constipation, unspecified: Secondary | ICD-10-CM | POA: Diagnosis not present

## 2016-05-03 DIAGNOSIS — I255 Ischemic cardiomyopathy: Secondary | ICD-10-CM | POA: Diagnosis present

## 2016-05-03 DIAGNOSIS — I472 Ventricular tachycardia: Secondary | ICD-10-CM | POA: Diagnosis not present

## 2016-05-03 DIAGNOSIS — I10 Essential (primary) hypertension: Secondary | ICD-10-CM | POA: Diagnosis present

## 2016-05-03 DIAGNOSIS — I447 Left bundle-branch block, unspecified: Secondary | ICD-10-CM | POA: Diagnosis present

## 2016-05-03 DIAGNOSIS — Z79899 Other long term (current) drug therapy: Secondary | ICD-10-CM

## 2016-05-03 DIAGNOSIS — I129 Hypertensive chronic kidney disease with stage 1 through stage 4 chronic kidney disease, or unspecified chronic kidney disease: Secondary | ICD-10-CM | POA: Diagnosis present

## 2016-05-03 DIAGNOSIS — Z951 Presence of aortocoronary bypass graft: Secondary | ICD-10-CM | POA: Diagnosis not present

## 2016-05-03 DIAGNOSIS — I25110 Atherosclerotic heart disease of native coronary artery with unstable angina pectoris: Secondary | ICD-10-CM | POA: Diagnosis not present

## 2016-05-03 DIAGNOSIS — N189 Chronic kidney disease, unspecified: Secondary | ICD-10-CM | POA: Diagnosis present

## 2016-05-03 DIAGNOSIS — E1122 Type 2 diabetes mellitus with diabetic chronic kidney disease: Secondary | ICD-10-CM | POA: Diagnosis present

## 2016-05-03 DIAGNOSIS — I2510 Atherosclerotic heart disease of native coronary artery without angina pectoris: Secondary | ICD-10-CM | POA: Diagnosis not present

## 2016-05-03 DIAGNOSIS — Z7902 Long term (current) use of antithrombotics/antiplatelets: Secondary | ICD-10-CM | POA: Diagnosis not present

## 2016-05-03 DIAGNOSIS — D62 Acute posthemorrhagic anemia: Secondary | ICD-10-CM | POA: Diagnosis not present

## 2016-05-03 DIAGNOSIS — Z7984 Long term (current) use of oral hypoglycemic drugs: Secondary | ICD-10-CM | POA: Diagnosis not present

## 2016-05-03 DIAGNOSIS — R079 Chest pain, unspecified: Secondary | ICD-10-CM | POA: Diagnosis not present

## 2016-05-03 DIAGNOSIS — I214 Non-ST elevation (NSTEMI) myocardial infarction: Principal | ICD-10-CM

## 2016-05-03 DIAGNOSIS — Z01810 Encounter for preprocedural cardiovascular examination: Secondary | ICD-10-CM | POA: Diagnosis not present

## 2016-05-03 DIAGNOSIS — G4730 Sleep apnea, unspecified: Secondary | ICD-10-CM | POA: Diagnosis not present

## 2016-05-03 DIAGNOSIS — Z7982 Long term (current) use of aspirin: Secondary | ICD-10-CM

## 2016-05-03 DIAGNOSIS — N289 Disorder of kidney and ureter, unspecified: Secondary | ICD-10-CM

## 2016-05-03 DIAGNOSIS — E7800 Pure hypercholesterolemia, unspecified: Secondary | ICD-10-CM | POA: Diagnosis not present

## 2016-05-03 DIAGNOSIS — Z4682 Encounter for fitting and adjustment of non-vascular catheter: Secondary | ICD-10-CM | POA: Diagnosis not present

## 2016-05-03 HISTORY — PX: CARDIAC CATHETERIZATION: SHX172

## 2016-05-03 LAB — CBC
HEMATOCRIT: 42.5 % (ref 39.0–52.0)
HEMOGLOBIN: 13.5 g/dL (ref 13.0–17.0)
MCH: 28.9 pg (ref 26.0–34.0)
MCHC: 31.8 g/dL (ref 30.0–36.0)
MCV: 91 fL (ref 78.0–100.0)
Platelets: 229 10*3/uL (ref 150–400)
RBC: 4.67 MIL/uL (ref 4.22–5.81)
RDW: 14.9 % (ref 11.5–15.5)
WBC: 7.1 10*3/uL (ref 4.0–10.5)

## 2016-05-03 LAB — I-STAT CHEM 8, ED
BUN: 16 mg/dL (ref 6–20)
CREATININE: 1.4 mg/dL — AB (ref 0.61–1.24)
Calcium, Ion: 1.15 mmol/L (ref 1.15–1.40)
Chloride: 111 mmol/L (ref 101–111)
Glucose, Bld: 158 mg/dL — ABNORMAL HIGH (ref 65–99)
HEMATOCRIT: 43 % (ref 39.0–52.0)
HEMOGLOBIN: 14.6 g/dL (ref 13.0–17.0)
POTASSIUM: 4.1 mmol/L (ref 3.5–5.1)
Sodium: 143 mmol/L (ref 135–145)
TCO2: 22 mmol/L (ref 0–100)

## 2016-05-03 LAB — BASIC METABOLIC PANEL
ANION GAP: 6 (ref 5–15)
BUN: 15 mg/dL (ref 6–20)
CHLORIDE: 109 mmol/L (ref 101–111)
CO2: 26 mmol/L (ref 22–32)
Calcium: 10.3 mg/dL (ref 8.9–10.3)
Creatinine, Ser: 1.35 mg/dL — ABNORMAL HIGH (ref 0.61–1.24)
GFR calc Af Amer: 56 mL/min — ABNORMAL LOW (ref >60)
GFR, EST NON AFRICAN AMERICAN: 48 mL/min — AB (ref >60)
GLUCOSE: 162 mg/dL — AB (ref 65–99)
POTASSIUM: 3.9 mmol/L (ref 3.5–5.1)
Sodium: 141 mmol/L (ref 135–145)

## 2016-05-03 LAB — I-STAT TROPONIN, ED: Troponin i, poc: 1.32 ng/mL (ref 0.00–0.08)

## 2016-05-03 SURGERY — LEFT HEART CATH AND CORONARY ANGIOGRAPHY
Anesthesia: LOCAL

## 2016-05-03 MED ORDER — SODIUM CHLORIDE 0.9 % IV SOLN: 250.0000 mL | INTRAVENOUS | Status: DC | PRN

## 2016-05-03 MED ORDER — PANTOPRAZOLE SODIUM 20 MG PO TBEC
20.0000 mg | DELAYED_RELEASE_TABLET | Freq: Every day | ORAL | Status: DC
  Administered 2016-05-04 – 2016-05-08 (×5): 20 mg via ORAL
  Filled 2016-05-03 (×5): qty 1

## 2016-05-03 MED ORDER — HEPARIN (PORCINE) IN NACL 2-0.9 UNIT/ML-% IJ SOLN
INTRAMUSCULAR | Status: DC | PRN
  Administered 2016-05-03: 1000 mL

## 2016-05-03 MED ORDER — AMIODARONE HCL IN DEXTROSE 360-4.14 MG/200ML-% IV SOLN
30.0000 mg/h | INTRAVENOUS | Status: DC
  Administered 2016-05-04 – 2016-05-05 (×5): 30 mg/h via INTRAVENOUS
  Filled 2016-05-03 (×3): qty 200

## 2016-05-03 MED ORDER — HEPARIN (PORCINE) IN NACL 100-0.45 UNIT/ML-% IJ SOLN
700.0000 [IU]/h | INTRAMUSCULAR | Status: DC
  Administered 2016-05-04: 850 [IU]/h via INTRAVENOUS
  Administered 2016-05-04: 1000 [IU]/h via INTRAVENOUS
  Administered 2016-05-06 – 2016-05-08 (×2): 700 [IU]/h via INTRAVENOUS
  Filled 2016-05-03 (×6): qty 250

## 2016-05-03 MED ORDER — LIDOCAINE HCL (PF) 1 % IJ SOLN
INTRAMUSCULAR | Status: DC | PRN
  Administered 2016-05-03: 2 mL via SUBCUTANEOUS

## 2016-05-03 MED ORDER — AMIODARONE HCL IN DEXTROSE 360-4.14 MG/200ML-% IV SOLN
60.0000 mg/h | INTRAVENOUS | Status: DC
  Administered 2016-05-03 – 2016-05-04 (×2): 60 mg/h via INTRAVENOUS
  Filled 2016-05-03 (×2): qty 200

## 2016-05-03 MED ORDER — MIDAZOLAM HCL 2 MG/2ML IJ SOLN
INTRAMUSCULAR | Status: AC
  Filled 2016-05-03: qty 2

## 2016-05-03 MED ORDER — LIDOCAINE HCL (CARDIAC) 20 MG/ML IV SOLN
100.0000 mg | Freq: Once | INTRAVENOUS | Status: AC
  Administered 2016-05-03: 100 mg via INTRAVENOUS

## 2016-05-03 MED ORDER — IOPAMIDOL (ISOVUE-370) INJECTION 76%
INTRAVENOUS | Status: AC
  Filled 2016-05-03: qty 125

## 2016-05-03 MED ORDER — MIDAZOLAM HCL 2 MG/2ML IJ SOLN
INTRAMUSCULAR | Status: DC | PRN
  Administered 2016-05-03: 1 mg via INTRAVENOUS

## 2016-05-03 MED ORDER — INSULIN GLARGINE 100 UNIT/ML ~~LOC~~ SOLN
50.0000 [IU] | Freq: Every day | SUBCUTANEOUS | Status: DC
  Administered 2016-05-03 – 2016-05-06 (×4): 50 [IU] via SUBCUTANEOUS
  Filled 2016-05-03 (×5): qty 0.5

## 2016-05-03 MED ORDER — AMIODARONE IV BOLUS ONLY 150 MG/100ML
INTRAVENOUS | Status: AC
  Administered 2016-05-03: 150 mg
  Filled 2016-05-03: qty 100

## 2016-05-03 MED ORDER — SODIUM CHLORIDE 0.9 % WEIGHT BASED INFUSION
1.0000 mL/kg/h | INTRAVENOUS | Status: AC
  Administered 2016-05-03: 1 mL/kg/h via INTRAVENOUS

## 2016-05-03 MED ORDER — LIDOCAINE HCL (CARDIAC) 20 MG/ML IV SOLN
INTRAVENOUS | Status: AC
  Filled 2016-05-03: qty 5

## 2016-05-03 MED ORDER — NITROGLYCERIN 1 MG/10 ML FOR IR/CATH LAB
INTRA_ARTERIAL | Status: AC
  Filled 2016-05-03: qty 10

## 2016-05-03 MED ORDER — ISOSORBIDE MONONITRATE ER 30 MG PO TB24
60.0000 mg | ORAL_TABLET | Freq: Every day | ORAL | Status: DC
  Administered 2016-05-04 – 2016-05-08 (×5): 60 mg via ORAL
  Filled 2016-05-03 (×5): qty 1

## 2016-05-03 MED ORDER — IOPAMIDOL (ISOVUE-370) INJECTION 76%
INTRAVENOUS | Status: DC | PRN
  Administered 2016-05-03: 55 mL via INTRA_ARTERIAL

## 2016-05-03 MED ORDER — VERAPAMIL HCL 2.5 MG/ML IV SOLN
INTRAVENOUS | Status: AC
  Filled 2016-05-03: qty 2

## 2016-05-03 MED ORDER — FENTANYL CITRATE (PF) 100 MCG/2ML IJ SOLN
INTRAMUSCULAR | Status: AC
  Filled 2016-05-03: qty 2

## 2016-05-03 MED ORDER — HEPARIN (PORCINE) IN NACL 2-0.9 UNIT/ML-% IJ SOLN
INTRAMUSCULAR | Status: AC
Start: 2016-05-03 — End: 2016-05-03
  Filled 2016-05-03: qty 1000

## 2016-05-03 MED ORDER — NITROGLYCERIN 0.4 MG SL SUBL: 0.4000 mg | SUBLINGUAL_TABLET | SUBLINGUAL | Status: DC | PRN

## 2016-05-03 MED ORDER — ASPIRIN 81 MG PO CHEW
324.0000 mg | CHEWABLE_TABLET | Freq: Once | ORAL | Status: AC
  Administered 2016-05-03: 324 mg via ORAL
  Filled 2016-05-03: qty 4

## 2016-05-03 MED ORDER — HEPARIN (PORCINE) IN NACL 100-0.45 UNIT/ML-% IJ SOLN
12.0000 [IU]/kg/h | INTRAMUSCULAR | Status: DC
  Administered 2016-05-03: 12 [IU]/kg/h via INTRAVENOUS
  Filled 2016-05-03: qty 250

## 2016-05-03 MED ORDER — ONDANSETRON HCL 4 MG/2ML IJ SOLN: 4.0000 mg | Freq: Four times a day (QID) | INTRAMUSCULAR | Status: DC | PRN

## 2016-05-03 MED ORDER — LIDOCAINE HCL (PF) 1 % IJ SOLN
INTRAMUSCULAR | Status: AC
  Filled 2016-05-03: qty 30

## 2016-05-03 MED ORDER — SODIUM CHLORIDE 0.9% FLUSH
3.0000 mL | Freq: Two times a day (BID) | INTRAVENOUS | Status: DC
  Administered 2016-05-04: 3 mL via INTRAVENOUS
  Administered 2016-05-04: 10 mL via INTRAVENOUS
  Administered 2016-05-04 – 2016-05-08 (×5): 3 mL via INTRAVENOUS

## 2016-05-03 MED ORDER — INSULIN ASPART 100 UNIT/ML ~~LOC~~ SOLN
0.0000 [IU] | Freq: Three times a day (TID) | SUBCUTANEOUS | Status: DC
  Administered 2016-05-05: 2 [IU] via SUBCUTANEOUS

## 2016-05-03 MED ORDER — GLIMEPIRIDE 4 MG PO TABS: 2.0000 mg | ORAL_TABLET | Freq: Two times a day (BID) | ORAL | Status: DC

## 2016-05-03 MED ORDER — LISINOPRIL 20 MG PO TABS: 40.0000 mg | ORAL_TABLET | Freq: Every day | ORAL | Status: DC

## 2016-05-03 MED ORDER — GLIMEPIRIDE 4 MG PO TABS
2.0000 mg | ORAL_TABLET | Freq: Two times a day (BID) | ORAL | Status: DC
  Administered 2016-05-04 – 2016-05-05 (×3): 2 mg via ORAL
  Filled 2016-05-03 (×4): qty 1

## 2016-05-03 MED ORDER — ASPIRIN EC 81 MG PO TBEC
81.0000 mg | DELAYED_RELEASE_TABLET | Freq: Every day | ORAL | Status: DC
  Administered 2016-05-04 – 2016-05-08 (×5): 81 mg via ORAL
  Filled 2016-05-03 (×5): qty 1

## 2016-05-03 MED ORDER — AMLODIPINE BESYLATE 10 MG PO TABS: 10.0000 mg | ORAL_TABLET | Freq: Every day | ORAL | Status: DC

## 2016-05-03 MED ORDER — LISINOPRIL 20 MG PO TABS
20.0000 mg | ORAL_TABLET | Freq: Every day | ORAL | Status: DC
  Administered 2016-05-04 – 2016-05-08 (×5): 20 mg via ORAL
  Filled 2016-05-03 (×5): qty 1

## 2016-05-03 MED ORDER — SODIUM CHLORIDE 0.9% FLUSH: 3.0000 mL | INTRAVENOUS | Status: DC | PRN

## 2016-05-03 MED ORDER — ATORVASTATIN CALCIUM 80 MG PO TABS
80.0000 mg | ORAL_TABLET | Freq: Every day | ORAL | Status: DC
  Administered 2016-05-04 – 2016-05-15 (×11): 80 mg via ORAL
  Filled 2016-05-03 (×4): qty 1
  Filled 2016-05-03: qty 2
  Filled 2016-05-03 (×6): qty 1

## 2016-05-03 MED ORDER — AMIODARONE LOAD VIA INFUSION
150.0000 mg | Freq: Once | INTRAVENOUS | Status: AC
  Administered 2016-05-03: 150 mg via INTRAVENOUS
  Filled 2016-05-03: qty 83.34

## 2016-05-03 MED ORDER — AMLODIPINE BESYLATE 5 MG PO TABS
5.0000 mg | ORAL_TABLET | Freq: Every day | ORAL | Status: DC
  Administered 2016-05-04 – 2016-05-08 (×5): 5 mg via ORAL
  Filled 2016-05-03 (×5): qty 1

## 2016-05-03 MED ORDER — FENTANYL CITRATE (PF) 100 MCG/2ML IJ SOLN
INTRAMUSCULAR | Status: DC | PRN
  Administered 2016-05-03: 25 ug via INTRAVENOUS

## 2016-05-03 MED ORDER — ACETAMINOPHEN 325 MG PO TABS: 650.0000 mg | ORAL_TABLET | ORAL | Status: DC | PRN

## 2016-05-03 MED ORDER — HEPARIN BOLUS VIA INFUSION
5000.0000 [IU] | Freq: Once | INTRAVENOUS | Status: AC
  Administered 2016-05-03: 5000 [IU] via INTRAVENOUS

## 2016-05-03 MED ORDER — VERAPAMIL HCL 2.5 MG/ML IV SOLN
INTRAVENOUS | Status: DC | PRN
  Administered 2016-05-03: 10 mL via INTRA_ARTERIAL

## 2016-05-03 SURGICAL SUPPLY — 11 items
CATH INFINITI 5 FR JL3.5 (CATHETERS) ×2 IMPLANT
CATH INFINITI JR4 5F (CATHETERS) ×2 IMPLANT
DEVICE RAD COMP TR BAND LRG (VASCULAR PRODUCTS) ×2 IMPLANT
GLIDESHEATH SLEND SS 6F .021 (SHEATH) ×2 IMPLANT
GUIDEWIRE INQWIRE 1.5J.035X260 (WIRE) ×1 IMPLANT
INQWIRE 1.5J .035X260CM (WIRE) ×2
KIT ENCORE 26 ADVANTAGE (KITS) ×4 IMPLANT
KIT HEART LEFT (KITS) ×2 IMPLANT
PACK CARDIAC CATHETERIZATION (CUSTOM PROCEDURE TRAY) ×2 IMPLANT
TRANSDUCER W/STOPCOCK (MISCELLANEOUS) ×2 IMPLANT
TUBING CIL FLEX 10 FLL-RA (TUBING) ×2 IMPLANT

## 2016-05-03 NOTE — H&P (Signed)
Allen [name] is an 82 y.o. male.   Primary Cardiologist: PMD: Chief Complaint: Chest discomfort/ventricular tachycardia HPI: 82 year old man with a known history of coronary artery disease. He was hospitalized at [facility] recently with chest discomfort. He had a stress test which showed some inferoapical ischemia and ejection fraction of 32%. He saw his cardiologist, Dr. Domenic Polite couple of days ago and medical therapy was adjusted.  Earlier today, at about 5 PM, he began to have severe chest discomfort. It waxed and waned for several hours. He went to the ER at [facility]. He has a chronic left bundle branch block and they noted some ST changes even with the left bundle branch block. His troponin was positive. He was having intermittent runs of ventricular tachycardia with severe chest pain.  Upon arrival to [facility], his chest pain had resolved. He had been started on IV amiodarone at the time of leaving [facility]. His rhythm stabilized. He was sent here for emergent catheterization.  Past Medical History:  Diagnosis Date  . CAD (coronary artery disease)    Multivessel obstructive CAD involving the RCA, circumflex, and obtuse marginal system with only mild LAD disease May 2016  . Essential hypertension   . Hyperlipidemia   . Ischemic cardiomyopathy    LVEF 35% May 2016  . LBBB (left bundle branch block)   . Sinus bradycardia     Past Surgical History:  Procedure Laterality Date  . CARDIAC CATHETERIZATION N/A 11/02/2014   Procedure: Left Heart Cath and Coronary Angiography;  Surgeon: Belva Crome, MD;  Location: [place] CV LAB;  Service: Cardiovascular;  Laterality: N/A;    Family History  Problem Relation Age of Onset  . Coronary artery disease Brother    Social History:  reports that he quit smoking about 4 years ago. He has never used smokeless tobacco. He reports that he does not drink alcohol or use drugs.  Allergies: No Known Allergies  Medications Prior to  Admission  Medication Sig Dispense Refill  . amLODipine (NORVASC) 10 MG tablet TAKE 1 TABLET ONE TIME DAILY 90 tablet 3  . aspirin 81 MG EC tablet Take 81 mg by mouth daily.    Marland Kitchen atorvastatin (LIPITOR) 80 MG tablet Take 1 tablet (80 mg total) by mouth daily at 6 PM. 90 tablet 3  . clopidogrel (PLAVIX) 75 MG tablet TAKE 1 TABLET ONE TIME DAILY WITH BREAKFAST 90 tablet 3  . glimepiride (AMARYL) 2 MG tablet Take 2 mg by mouth 2 (two) times daily.    . isosorbide mononitrate (IMDUR) 60 MG 24 hr tablet TAKE 1 TABLET ONE TIME DAILY 90 tablet 3  . LANTUS SOLOSTAR 100 UNIT/ML Solostar Pen Inject 50 Units into the skin at bedtime.    Marland Kitchen lisinopril (PRINIVIL,ZESTRIL) 40 MG tablet TAKE 1 TABLET EVERY DAY 90 tablet 3  . metFORMIN (GLUCOPHAGE) 500 MG tablet Take 1 tablet (500 mg total) by mouth 2 (two) times daily.    . nitroGLYCERIN (NITROSTAT) 0.4 MG SL tablet Place 1 tablet (0.4 mg total) under the tongue every 5 (five) minutes x 3 doses as needed for chest pain. If no relief after 3 rd, proceed to the ED for an evaluation 25 tablet 3  . pantoprazole (PROTONIX) 20 MG tablet Take 1 tablet (20 mg total) by mouth daily. 90 tablet 3    Results for orders placed or performed during the hospital encounter of 05/03/16 (from the past 48 hour(s))  Basic metabolic panel     Status: Abnormal  Collection Time: 05/03/16  8:22 PM  Result Value Ref Range   Sodium 141 135 - 145 mmol/L   Potassium 3.9 3.5 - 5.1 mmol/L   Chloride 109 101 - 111 mmol/L   CO2 26 22 - 32 mmol/L   Glucose, Bld 162 (H) 65 - 99 mg/dL   BUN 15 6 - 20 mg/dL   Creatinine, Ser 1.35 (H) 0.61 - 1.24 mg/dL   Calcium 10.3 8.9 - 10.3 mg/dL   GFR calc non Af Amer 48 (L) >60 mL/min   GFR calc Af Amer 56 (L) >60 mL/min    Comment: (NOTE) The eGFR has been calculated using the CKD EPI equation. This calculation has not been validated in all clinical situations. eGFR's persistently <60 mL/min signify possible Chronic Kidney Disease.    Anion gap  6 5 - 15  CBC     Status: None   Collection Time: 05/03/16  8:22 PM  Result Value Ref Range   WBC 7.1 4.0 - 10.5 K/uL   RBC 4.67 4.22 - 5.81 MIL/uL   Hemoglobin 13.5 13.0 - 17.0 g/dL   HCT 42.5 39.0 - 52.0 %   MCV 91.0 78.0 - 100.0 fL   MCH 28.9 26.0 - 34.0 pg   MCHC 31.8 30.0 - 36.0 g/dL   RDW 14.9 11.5 - 15.5 %   Platelets 229 150 - 400 K/uL  I-stat troponin, ED     Status: Abnormal   Collection Time: 05/03/16  8:25 PM  Result Value Ref Range   Troponin i, poc 1.32 (HH) 0.00 - 0.08 ng/mL   Comment NOTIFIED PHYSICIAN    Comment 3            Comment: Due to the release kinetics of cTnI, a negative result within the first hours of the onset of symptoms does not rule out myocardial infarction with certainty. If myocardial infarction is still suspected, repeat the test at appropriate intervals.   I-Stat Chem 8, ED     Status: Abnormal   Collection Time: 05/03/16  8:28 PM  Result Value Ref Range   Sodium 143 135 - 145 mmol/L   Potassium 4.1 3.5 - 5.1 mmol/L   Chloride 111 101 - 111 mmol/L   BUN 16 6 - 20 mg/dL   Creatinine, Ser 1.40 (H) 0.61 - 1.24 mg/dL   Glucose, Bld 158 (H) 65 - 99 mg/dL   Calcium, Ion 1.15 1.15 - 1.40 mmol/L   TCO2 22 0 - 100 mmol/L   Hemoglobin 14.6 13.0 - 17.0 g/dL   HCT 43.0 39.0 - 52.0 %   Dg Chest Portable 1 View  Result Date: 05/03/2016 CLINICAL DATA:  Centralized chest pain EXAM: PORTABLE CHEST 1 VIEW COMPARISON:  04/17/2016 FINDINGS: Left pacer patch is obscure the lower chest and left lung base. Mildly low lung volumes. Stable cardiomegaly. No overt edema. No gross consolidations. IMPRESSION: Left lung base obscured by overlying devices. Stable cardiomegaly without overt failure Electronically Signed   By: Donavan Foil M.D.   On: 05/03/2016 20:50    ROS:   OBJECTIVE:   Vitals:   Vitals:   05/03/16 2052 05/03/16 2055 05/03/16 2100 05/03/16 2152  BP: 124/86 137/73 129/71   Pulse: 72     Resp: 17 21 14    Temp:      TempSrc:      SpO2:  96% 97% 97% 100%  Weight:      Height:       I&O's:  No intake or output data  in the 24 hours ending 05/03/16 2204 TELEMETRY: Reviewed telemetry pt in Normal sinus rhythm:     PHYSICAL EXAM General: Well developed, well nourished, in no acute distress Head:   Normal cephalic and atramatic  Lungs:   Clear bilaterally to auscultation. Heart:   HRRR S1 S2  No JVD.   Abdomen: abdomen soft and non-tender Msk:  Back normal,  Normal strength and tone for age. Extremities:   No edema.   Neuro: Alert and oriented. Psych:  Normal affect, responds appropriately  LABS: Basic Metabolic Panel:  Recent Labs  05/03/16 2022 05/03/16 2028  NA 141 143  K 3.9 4.1  CL 109 111  CO2 26  --   GLUCOSE 162* 158*  BUN 15 16  CREATININE 1.35* 1.40*  CALCIUM 10.3  --    Liver Function Tests: No results for input(s): AST, ALT, ALKPHOS, BILITOT, PROT, ALBUMIN in the last 72 hours. No results for input(s): LIPASE, AMYLASE in the last 72 hours. CBC:  Recent Labs  05/03/16 2022 05/03/16 2028  WBC 7.1  --   HGB 13.5 14.6  HCT 42.5 43.0  MCV 91.0  --   PLT 229  --    Cardiac Enzymes: No results for input(s): CKTOTAL, CKMB, CKMBINDEX, TROPONINI in the last 72 hours. BNP: Invalid input(s): POCBNP D-Dimer: No results for input(s): DDIMER in the last 72 hours. Hemoglobin A1C: No results for input(s): HGBA1C in the last 72 hours. Fasting Lipid Panel: No results for input(s): CHOL, HDL, LDLCALC, TRIG, CHOLHDL, LDLDIRECT in the last 72 hours. Thyroid Function Tests: No results for input(s): TSH, T4TOTAL, T3FREE, THYROIDAB in the last 72 hours.  Invalid input(s): FREET3 Anemia Panel: No results for input(s): VITAMINB12, FOLATE, FERRITIN, TIBC, IRON, RETICCTPCT in the last 72 hours. Coag Panel:   Lab Results  Component Value Date   INR 1.02 11/02/2014     ECG: Left bundle branch block, nonsustained ventricular tachycardia, accentuated anterior ST elevation and inferolateral ST  depression noted  Assessment/Plan 1) Non-ST elevation MI: I reviewed the ECGs. Emergent catheterization showed a ruptured plaque at the distal left main involving the ostial circumflex and ostial LAD. This is likely the cause of his severe chest discomfort and rhythm disturbance. We'll watch him in the ICU. Continue IV amiodarone. Restart IV heparin in 6 hours after the TR band. Will obtain surgical consultation tomorrow. Hold Plavix in anticipation of bypass surgery. He also has RCA disease.  2) hypertension: Blood pressure stable at this time. Continue antihypertensives. 3) hyperlipidemia: Continue atorvastatin. 4) diabetes: Continue insulin. Start sliding scale as well. He'll be watched in the ICU.   Larae Grooms 05/03/2016, 10:04 PM

## 2016-05-03 NOTE — ED Notes (Signed)
[facility] at this [facility], no answer for cath lab.

## 2016-05-03 NOTE — ED Provider Notes (Signed)
[facility]-EMERGENCY DEPT Provider Note   CSN: [id number] Arrival date & time: 05/03/16  2009   By signing my name below, I, Valentino Saxon, attest that this documentation has been prepared under the direction and in the presence of Eber Hong, MD. Electronically Signed: Valentino Saxon, ED Scribe. 05/03/16. 8:57 PM.  History   Chief Complaint Chief Complaint  Patient presents with  . Chest Pain    The history is provided by the patient. No language interpreter was used.    HPI Comments: Allen [name] is a 82 y.o. male with Hx of HTN, DM Hyperlipidemia, CAD, who presents to the Emergency Department complaining of moderate, intermittent, CP in midsternum that occurred two days ago. Pt reports associated pain and soreness in his chest accompanied with an intermittent discomfort. Pt reports seeing his cardiologist three days ago. Per Pt note and Pt, he had a stress test done at [facility] three days ago, with an ejection fraction of 35% showing a small inferior apicale ischemia. Pt states being on Clavix and Lisinopril for HTN. He also notes having a scheduled echocardioogram for 05/17/2016. He states having a H/x of one heart attack. Pt reports he does not have a pacemaker and has no PMHx of stents. Per Pt note, Pt had a heart catherization in 2016 with multiple vessel obstructions, involving right coronary and LCX. Per note, reports pt's LAD was only mild disease free. Pt's note also reports right coronary artery showed ~80-70% lesions with a 100% lesion in his second marginal, 70% first marginal and LCX of 75%. Pt states he had an Echo done on 06/2015. Per Pt note, his ejection fraction was 45%. Pt denies SOB, sore throat, diarrhea, back pain, swelling of lower extremities. He also denies any symptoms in upper extremities associated with CP.   Activated stemi at 840  Systolic murmur  c stat-infrequently  Past Medical History:  Diagnosis Date  . CAD (coronary artery disease)    Multivessel obstructive CAD involving the RCA, circumflex, and obtuse marginal system with only mild LAD disease May 2016  . Essential hypertension   . Hyperlipidemia   . Ischemic cardiomyopathy    LVEF 35% May 2016  . LBBB (left bundle branch block)   . Sinus bradycardia     Patient Active Problem List   Diagnosis Date Noted  . Ischemic cardiomyopathy 11/26/2014  . CAD (coronary artery disease), native coronary artery 11/24/2014  . LBBB (left bundle branch block) 10/23/2014  . Mitral regurgitation 09/19/2013  . Shortness of breath 08/29/2013  . Dizziness   . Aortic valve sclerosis   . Aortic insufficiency   . Left ventricular hypertrophy   . Ejection fraction   . Abnormal chest x-ray   . Hypertension   . Hyperlipidemia   . Tobacco abuse   . Chronic kidney disease   . Sinus bradycardia   . Obstructive sleep apnea     Past Surgical History:  Procedure Laterality Date  . CARDIAC CATHETERIZATION N/A 11/02/2014   Procedure: Left Heart Cath and Coronary Angiography;  Surgeon: Lyn Records, MD;  Location: [facility] INVASIVE CV LAB;  Service: Cardiovascular;  Laterality: N/A;       Home Medications    Prior to Admission medications   Medication Sig Start Date End Date Taking? Authorizing Provider  amLODipine (NORVASC) 10 MG tablet TAKE 1 TABLET ONE TIME DAILY 11/01/15   Jonelle Sidle, MD  aspirin 81 MG EC tablet Take 81 mg by mouth daily.    Historical Provider, MD  atorvastatin (LIPITOR) 80 MG tablet Take 1 tablet (80 mg total) by mouth daily at 6 PM. 11/26/14   Luis AbedJeffrey D Katz, MD  clopidogrel (PLAVIX) 75 MG tablet TAKE 1 TABLET ONE TIME DAILY WITH BREAKFAST 11/01/15   Jonelle SidleSamuel G McDowell, MD  glimepiride (AMARYL) 2 MG tablet Take 2 mg by mouth 2 (two) times daily. 06/10/13   Historical Provider, MD  isosorbide mononitrate (IMDUR) 60 MG 24 hr tablet TAKE 1 TABLET ONE TIME DAILY 11/01/15   Jonelle SidleSamuel G McDowell, MD  LANTUS SOLOSTAR 100 UNIT/ML Solostar Pen Inject 50 Units into the skin  at bedtime. 08/06/13   Historical Provider, MD  lisinopril (PRINIVIL,ZESTRIL) 40 MG tablet TAKE 1 TABLET EVERY DAY 01/24/16   Jonelle SidleSamuel G McDowell, MD  metFORMIN (GLUCOPHAGE) 500 MG tablet Take 1 tablet (500 mg total) by mouth 2 (two) times daily. 11/03/14   Dwana MelenaBryan W Hager, PA-C  nitroGLYCERIN (NITROSTAT) 0.4 MG SL tablet Place 1 tablet (0.4 mg total) under the tongue every 5 (five) minutes x 3 doses as needed for chest pain. If no relief after 3 rd, proceed to the ED for an evaluation 12/03/15   Jonelle SidleSamuel G McDowell, MD  pantoprazole (PROTONIX) 20 MG tablet Take 1 tablet (20 mg total) by mouth daily. 05/01/16   Jonelle SidleSamuel G McDowell, MD    Family History Family History  Problem Relation Age of Onset  . Coronary artery disease Brother     Social History Social History  Substance Use Topics  . Smoking status: Former Smoker    Quit date: 06/13/2011  . Smokeless tobacco: Never Used  . Alcohol use No     Allergies   Patient has no known allergies.   Review of Systems Review of Systems  HENT: Negative for sore throat.   Respiratory: Negative for shortness of breath.   Cardiovascular: Positive for chest pain. Negative for leg swelling.  Gastrointestinal: Negative for diarrhea.  Musculoskeletal: Negative for back pain.  All other systems reviewed and are negative.    Physical Exam Updated Vital Signs BP 124/86 (BP Location: Left Arm)   Pulse 72   Temp 97.6 F (36.4 C) (Oral)   Resp 17   Ht 5\' 2"  (1.575 m)   Wt 178 lb (80.7 kg)   SpO2 96%   BMI 32.56 kg/m   Physical Exam  Constitutional: He appears well-developed and well-nourished. No distress.  HENT:  Head: Normocephalic and atraumatic.  Mouth/Throat: Oropharynx is clear and moist. No oropharyngeal exudate.  Eyes: Conjunctivae and EOM are normal. Pupils are equal, round, and reactive to light. Right eye exhibits no discharge. Left eye exhibits no discharge. No scleral icterus.  Neck: Normal range of motion. Neck supple. No JVD  present. No thyromegaly present.  Cardiovascular: Normal rate, regular rhythm, normal heart sounds and intact distal pulses.  Exam reveals no gallop and no friction rub.   No murmur heard. Pulse is 80. Runs of V tack frequently. Longest run is 12 beats, occur every minute, multiple times.  No JVD   Pulmonary/Chest: Effort normal and breath sounds normal. No respiratory distress. He has no wheezes. He has no rales.  Abdominal: Soft. Bowel sounds are normal. He exhibits no distension and no mass. There is no tenderness.  Musculoskeletal: Normal range of motion. He exhibits no edema or tenderness.  No edema   Lymphadenopathy:    He has no cervical adenopathy.  Neurological: He is alert. Coordination normal.  Skin: Skin is warm and dry. No rash noted. No erythema.  Psychiatric:  He has a normal mood and affect. His behavior is normal.  Nursing note and vitals reviewed.    ED Treatments / Results   DIAGNOSTIC STUDIES: Oxygen Saturation is 98% on RA, normal by my interpretation.    COORDINATION OF CARE: 8:29 PM Discussed treatment plan with pt at bedside which includes labs, Chest Xr, EKG  and pt agreed to plan.  Labs (all labs ordered are listed, but only abnormal results are displayed) Labs Reviewed  BASIC METABOLIC PANEL - Abnormal; Notable for the following:       Result Value   Glucose, Bld 162 (*)    Creatinine, Ser 1.35 (*)    GFR calc non Af Amer 48 (*)    GFR calc Af Amer 56 (*)    All other components within normal limits  I-STAT TROPOININ, ED - Abnormal; Notable for the following:    Troponin i, poc 1.32 (*)    All other components within normal limits  I-STAT CHEM 8, ED - Abnormal; Notable for the following:    Creatinine, Ser 1.40 (*)    Glucose, Bld 158 (*)    All other components within normal limits  CBC    EKG  EKG Interpretation  Date/Time:  Wednesday May 03 2016 20:19:58 EST Ventricular Rate:  144 PR Interval:    QRS Duration: 169 QT  Interval:  360 QTC Calculation: 433 R Axis:   47 Text Interpretation:  Extreme tachycardia with wide complex, no further rhythm analysis attempted Underlying rhythm normal sinus with firs deg AV block LBBB with elevation in ST segments in anterior leads ST depression in the inferior leqads Run of VTACH Abnormal ekg Confirmed by Hyacinth MeekerMILLER  MD, Chrislynn Mosely ([id number]) on 05/03/2016 8:56:43 PM       Radiology Dg Chest Portable 1 View  Result Date: 05/03/2016 CLINICAL DATA:  Centralized chest pain EXAM: PORTABLE CHEST 1 VIEW COMPARISON:  04/17/2016 FINDINGS: Left pacer patch is obscure the lower chest and left lung base. Mildly low lung volumes. Stable cardiomegaly. No overt edema. No gross consolidations. IMPRESSION: Left lung base obscured by overlying devices. Stable cardiomegaly without overt failure Electronically Signed   By: Jasmine PangKim  Fujinaga M.D.   On: 05/03/2016 20:50    Procedures Procedures (including critical care time)  Medications Ordered in ED Medications  lidocaine (cardiac) 100 mg/375ml (XYLOCAINE) 20 MG/ML injection 2% (not administered)  amiodarone (NEXTERONE) 1.8 mg/mL load via infusion 150 mg (not administered)  amiodarone (NEXTERONE PREMIX) 360-4.14 MG/200ML-% (1.8 mg/mL) IV infusion (60 mg/hr Intravenous New Bag/Given 05/03/16 2056)    Followed by  amiodarone (NEXTERONE PREMIX) 360-4.14 MG/200ML-% (1.8 mg/mL) IV infusion (not administered)  heparin ADULT infusion 100 units/mL (25000 units/27550mL sodium chloride 0.45%) (12 Units/kg/hr  80.7 kg Intravenous New Bag/Given 05/03/16 2049)  amiodarone (NEXTERONE) 150-4.21 MG/100ML-% bolus (150 mg  New Bag/Given 05/03/16 2036)  heparin bolus via infusion 5,000 Units (5,000 Units Intravenous Bolus from Bag 05/03/16 2050)  aspirin chewable tablet 324 mg (324 mg Oral Given 05/03/16 2048)  lidocaine (cardiac) 100 mg/735ml (XYLOCAINE) 20 MG/ML injection 2% 100 mg (100 mg Intravenous Given 05/03/16 2055)     Initial Impression / Assessment and Plan  / ED Course  I have reviewed the triage vital signs and the nursing notes.  Pertinent labs & imaging results that were available during my care of the patient were reviewed by me and considered in my medical decision making (see chart for details).  Clinical Course     The patient is critically ill, he appears  to have a STEMI, I have discussed this with the cardiologist on call who has requested that I activated code STEMI though in the presence of a left bundle branch block it can be difficult to elucidate what is truly a STEMI. At this time with an elevated troponin, frequent runs of ventricular tachycardia and the patient's ongoing chest pain we have activated and I have discussed the care with Dr. [place] who will accept the patient in transport. The patient has been given the following medications  #1. Aspirin #2 heparin bolus #3 amiodarone   The patient will go by critical care transport emergency traffic, critical care provided  CRITICAL CARE Performed by: Vida Roller Total critical care time: 35 minutes Critical care time was exclusive of separately billable procedures and treating other patients. Critical care was necessary to treat or prevent imminent or life-threatening deterioration. Critical care was time spent personally by me on the following activities: development of treatment plan with patient and/or surrogate as well as nursing, discussions with consultants, evaluation of patient's response to treatment, examination of patient, obtaining history from patient or surrogate, ordering and performing treatments and interventions, ordering and review of laboratory studies, ordering and review of radiographic studies, pulse oximetry and re-evaluation of patient's condition.   Final Clinical Impressions(s) / ED Diagnoses   Final diagnoses:  ST elevation myocardial infarction (STEMI), unspecified artery (HCC)  Renal insufficiency    New Prescriptions New Prescriptions    No medications on file    I personally performed the services described in this documentation, which was scribed in my presence. The recorded information has been reviewed and is accurate.       Eber Hong, MD 05/03/16 2059

## 2016-05-03 NOTE — ED Notes (Signed)
Pt given 1.5mg /mL 150mg /11100mL of amiodarone bolus. MD Hyacinth MeekerMiller notified, stated this is acceptable.

## 2016-05-03 NOTE — Progress Notes (Signed)
ANTICOAGULATION CONSULT NOTE - Initial Consult  Pharmacy Consult for heparin  Indication: chest pain/ACS  No Known Allergies  Patient Measurements: Height: 5\' 2"  (157.5 cm) Weight: 178 lb (80.7 kg) IBW/kg (Calculated) : 54.6 Heparin Dosing Weight: 72kg  Vital Signs: Temp: 97.6 F (36.4 C) (11/22 2018) Temp Source: Oral (11/22 2018) BP: 129/71 (11/22 2100) Pulse Rate: 72 (11/22 2052)  Labs:  Recent Labs  05/03/16 2022 05/03/16 2028  HGB 13.5 14.6  HCT 42.5 43.0  PLT 229  --   CREATININE 1.35* 1.40*    Estimated Creatinine Clearance: 38.7 mL/min (by C-G formula based on SCr of 1.4 mg/dL (H)).   Medical History: Past Medical History:  Diagnosis Date  . CAD (coronary artery disease)    Multivessel obstructive CAD involving the RCA, circumflex, and obtuse marginal system with only mild LAD disease May 2016  . Essential hypertension   . Hyperlipidemia   . Ischemic cardiomyopathy    LVEF 35% May 2016  . LBBB (left bundle branch block)   . Sinus bradycardia     Medications:  Prescriptions Prior to Admission  Medication Sig Dispense Refill Last Dose  . amLODipine (NORVASC) 10 MG tablet TAKE 1 TABLET ONE TIME DAILY 90 tablet 3 Taking  . aspirin 81 MG EC tablet Take 81 mg by mouth daily.   Taking  . atorvastatin (LIPITOR) 80 MG tablet Take 1 tablet (80 mg total) by mouth daily at 6 PM. 90 tablet 3 Taking  . clopidogrel (PLAVIX) 75 MG tablet TAKE 1 TABLET ONE TIME DAILY WITH BREAKFAST 90 tablet 3 Taking  . glimepiride (AMARYL) 2 MG tablet Take 2 mg by mouth 2 (two) times daily.   Taking  . isosorbide mononitrate (IMDUR) 60 MG 24 hr tablet TAKE 1 TABLET ONE TIME DAILY 90 tablet 3 Taking  . LANTUS SOLOSTAR 100 UNIT/ML Solostar Pen Inject 50 Units into the skin at bedtime.   Taking  . lisinopril (PRINIVIL,ZESTRIL) 40 MG tablet TAKE 1 TABLET EVERY DAY 90 tablet 3 Taking  . metFORMIN (GLUCOPHAGE) 500 MG tablet Take 1 tablet (500 mg total) by mouth 2 (two) times daily.    Taking  . nitroGLYCERIN (NITROSTAT) 0.4 MG SL tablet Place 1 tablet (0.4 mg total) under the tongue every 5 (five) minutes x 3 doses as needed for chest pain. If no relief after 3 rd, proceed to the ED for an evaluation 25 tablet 3 Taking  . pantoprazole (PROTONIX) 20 MG tablet Take 1 tablet (20 mg total) by mouth daily. 90 tablet 3     Assessment: 82 yo male s/p cath and for CABG consult. Pharmacy consulted to dose heparin (to begin 8 hours post sheath removal; sheath removal done at 10:13pm). No anticoagulants PTA.   Goal of Therapy:  Heparin level 0.3-0.7 units/ml Monitor platelets by anticoagulation protocol: Yes   Plan:  -Begin heparin at 850 units/hr 8 hours post sheath -Heparin level in 8 hours and daily wth CBC daily  Harland Germanndrew Chigozie Basaldua, Pharm D 05/03/2016 10:18 PM

## 2016-05-03 NOTE — Progress Notes (Signed)
eLink Physician-Brief Progress Note Patient Name: Mosetta PuttSamuel J Gruenberg DOB: 11/07/1933 MRN: [id number]   Date of Service  05/03/2016  HPI/Events of Note  82 yo with acute with chest pain dx with NSTEMI Emergent catheterization showed a ruptured plaque at the distal left main involving the ostial circumflex and ostial LAD. This is likely the cause of his severe chest discomfort and rhythm disturbance.   PLAN: Continue IV amiodarone. Restart IV heparin in 6 hours after the TR band. surgical consultation tomorrow.  Hold Plavix in anticipation of bypass surgery. He also has RCA disease. Camera check: patient resting comfortably talking on his cell phone  eICU Interventions  No further recs at this time     Intervention Category Evaluation Type: New Patient Evaluation  Erin FullingKurian Yerick Eggebrecht 05/03/2016, 11:28 PM

## 2016-05-03 NOTE — ED Triage Notes (Signed)
Pt reports burning centralized chest pain. Pt reports he has been having intermittent bouts of same. Pt states his symptoms "have been going on for a long time."

## 2016-05-04 DIAGNOSIS — I255 Ischemic cardiomyopathy: Secondary | ICD-10-CM

## 2016-05-04 DIAGNOSIS — I25110 Atherosclerotic heart disease of native coronary artery with unstable angina pectoris: Secondary | ICD-10-CM

## 2016-05-04 DIAGNOSIS — E7800 Pure hypercholesterolemia, unspecified: Secondary | ICD-10-CM

## 2016-05-04 DIAGNOSIS — I447 Left bundle-branch block, unspecified: Secondary | ICD-10-CM

## 2016-05-04 LAB — GLUCOSE, CAPILLARY
GLUCOSE-CAPILLARY: 58 mg/dL — AB (ref 65–99)
Glucose-Capillary: 82 mg/dL (ref 65–99)
Glucose-Capillary: 84 mg/dL (ref 65–99)
Glucose-Capillary: 122 mg/dL — ABNORMAL HIGH (ref 65–99)
Glucose-Capillary: 66 mg/dL (ref 65–99)
Glucose-Capillary: 90 mg/dL (ref 65–99)

## 2016-05-04 LAB — CBC
HEMATOCRIT: 39.5 % (ref 39.0–52.0)
Hemoglobin: 12.3 g/dL — ABNORMAL LOW (ref 13.0–17.0)
MCH: 27.9 pg (ref 26.0–34.0)
MCHC: 31.1 g/dL (ref 30.0–36.0)
MCV: 89.6 fL (ref 78.0–100.0)
PLATELETS: 200 10*3/uL (ref 150–400)
RBC: 4.41 MIL/uL (ref 4.22–5.81)
RDW: 14.9 % (ref 11.5–15.5)
WBC: 6.2 10*3/uL (ref 4.0–10.5)

## 2016-05-04 LAB — TROPONIN I
TROPONIN I: 6.31 ng/mL — AB (ref <0.03)
TROPONIN I: 15.01 ng/mL — AB (ref <0.03)
Troponin I: 15.13 ng/mL (ref <0.03)

## 2016-05-04 LAB — MRSA PCR SCREENING: MRSA BY PCR: NEGATIVE

## 2016-05-04 LAB — HEPARIN LEVEL (UNFRACTIONATED)
HEPARIN UNFRACTIONATED: 0.26 [IU]/mL — AB (ref 0.30–0.70)
Heparin Unfractionated: <0.1 IU/mL — ABNORMAL LOW (ref 0.30–0.70)

## 2016-05-04 LAB — MAGNESIUM: Magnesium: 1.9 mg/dL (ref 1.7–2.4)

## 2016-05-04 NOTE — Progress Notes (Signed)
Blood sugar at 2136 was 66. Pt given Oj. Blood sugar rechecked at 2207 at 90. Monitoring pt.

## 2016-05-04 NOTE — Progress Notes (Signed)
CRITICAL VALUE ALERT  Critical value received:  Troponin 6.31  Date of notification:  05/04/2016  Time of notification:  0030  Critical value read back:Yes.    Nurse who received alert:  Earnest RosierKatelyn Roop RN  MD notified (1st page):  Bensimone  Expected finding d/t L main disease found in cath lab.

## 2016-05-04 NOTE — Progress Notes (Signed)
ANTICOAGULATION CONSULT NOTE  Pharmacy Consult for heparin  Indication: chest pain/ACS  No Known Allergies  Patient Measurements: Height: 5\' 2"  (157.5 cm) Weight: 180 lb (81.6 kg) IBW/kg (Calculated) : 54.6 Heparin Dosing Weight: 72kg  Vital Signs: Temp: 98 F (36.7 C) (11/23 1200) Temp Source: Oral (11/23 1200) BP: 110/65 (11/23 1500) Pulse Rate: 53 (11/23 1500)  Labs:  Recent Labs  05/03/16 2022 05/03/16 2028 05/03/16 2314 05/04/16 0504 05/04/16 1124 05/04/16 1457  HGB 13.5 14.6  --  12.3*  --   --   HCT 42.5 43.0  --  39.5  --   --   PLT 229  --   --  200  --   --   HEPARINUNFRC  --   --   --  <0.10*  --  0.26*  CREATININE 1.35* 1.40*  --   --   --   --   TROPONINI  --   --  6.31* 15.01* 15.13*  --     Estimated Creatinine Clearance: 38.9 mL/min (by C-G formula based on SCr of 1.4 mg/dL (H)).   Medical History: Past Medical History:  Diagnosis Date  . CAD (coronary artery disease)    Multivessel obstructive CAD involving the RCA, circumflex, and obtuse marginal system with only mild LAD disease May 2016  . Essential hypertension   . Hyperlipidemia   . Ischemic cardiomyopathy    LVEF 35% May 2016  . LBBB (left bundle branch block)   . Sinus bradycardia     Medications:  Prescriptions Prior to Admission  Medication Sig Dispense Refill Last Dose  . amLODipine (NORVASC) 10 MG tablet TAKE 1 TABLET ONE TIME DAILY 90 tablet 3 Taking  . aspirin 81 MG EC tablet Take 81 mg by mouth daily.   Taking  . atorvastatin (LIPITOR) 80 MG tablet Take 1 tablet (80 mg total) by mouth daily at 6 PM. 90 tablet 3 Taking  . clopidogrel (PLAVIX) 75 MG tablet TAKE 1 TABLET ONE TIME DAILY WITH BREAKFAST 90 tablet 3 Taking  . glimepiride (AMARYL) 2 MG tablet Take 2 mg by mouth 2 (two) times daily.   Taking  . isosorbide mononitrate (IMDUR) 60 MG 24 hr tablet TAKE 1 TABLET ONE TIME DAILY 90 tablet 3 Taking  . LANTUS SOLOSTAR 100 UNIT/ML Solostar Pen Inject 50 Units into the skin at  bedtime.   Taking  . lisinopril (PRINIVIL,ZESTRIL) 40 MG tablet TAKE 1 TABLET EVERY DAY 90 tablet 3 Taking  . metFORMIN (GLUCOPHAGE) 500 MG tablet Take 1 tablet (500 mg total) by mouth 2 (two) times daily.   Taking  . nitroGLYCERIN (NITROSTAT) 0.4 MG SL tablet Place 1 tablet (0.4 mg total) under the tongue every 5 (five) minutes x 3 doses as needed for chest pain. If no relief after 3 rd, proceed to the ED for an evaluation 25 tablet 3 Taking  . pantoprazole (PROTONIX) 20 MG tablet Take 1 tablet (20 mg total) by mouth daily. 90 tablet 3     Assessment: 82 yo male s/p cath and for possible CABG (needs Plavix washout). Pharmacy consulted to dose heparin. No anticoagulants PTA. Hg down a bit 12.3, plt wnl.  Heparin level slightly low on 850 units/h. No bleed or IV line issues per RN.   Goal of Therapy:  Heparin level 0.3-0.7 units/ml Monitor platelets by anticoagulation protocol: Yes   Plan:  -Increase heparin to 1000 units/hr -8h heparin level -Daily heparin level/CBC -Monitor s/sx bleeding    Babs BertinHaley Jalissa Heinzelman, PharmD, BCPS Clinical  Pharmacist 05/04/2016 3:33 PM

## 2016-05-04 NOTE — Progress Notes (Signed)
Patient Name: Allen [name] Date of Encounter: 05/04/2016  Primary Cardiologist: Simona HuhSam McDowell MD  Hospital Problem List     Principal Problem:   NSTEMI (non-ST elevated myocardial infarction) [facility](HCC) Active Problems:   Hypertension   Hyperlipidemia   Chronic kidney disease   LBBB (left bundle branch block)   Ischemic cardiomyopathy     Subjective   Feels well today. No chest pain overnight. No dyspnea.   Inpatient Medications    Scheduled Meds: . amLODipine  5 mg Oral Daily  . aspirin EC  81 mg Oral Daily  . atorvastatin  80 mg Oral q1800  . glimepiride  2 mg Oral BID  . insulin aspart  0-15 Units Subcutaneous TID WC  . insulin glargine  50 Units Subcutaneous QHS  . isosorbide mononitrate  60 mg Oral Daily  . lisinopril  20 mg Oral Daily  . pantoprazole  20 mg Oral Daily  . sodium chloride flush  3 mL Intravenous Q12H   Continuous Infusions: . amiodarone 30 mg/hr (05/04/16 0800)  . heparin 850 Units/hr (05/04/16 0800)   PRN Meds: sodium chloride, acetaminophen, nitroGLYCERIN, ondansetron (ZOFRAN) IV, sodium chloride flush   Vital Signs    Vitals:   05/04/16 0645 05/04/16 0700 05/04/16 0800 05/04/16 0829  BP:  134/82 134/69   Pulse: (!) 51 (!) 50 (!) 46   Resp: 11 (!) 9 10   Temp:    98.1 F (36.7 C)  TempSrc:    Oral  SpO2: 97% 100% 99%   Weight:      Height:        Intake/Output Summary (Last 24 hours) at 05/04/16 0936 Last data filed at 05/04/16 0900  Gross per 24 hour  Intake           669.95 ml  Output              575 ml  Net            94.95 ml   Filed Weights   05/03/16 2014 05/03/16 2300  Weight: 178 lb (80.7 kg) 180 lb (81.6 kg)    Physical Exam    GEN: Well nourished, overweight, in no acute distress.  HEENT: Grossly normal.  Neck: Supple, no JVD, carotid bruits, or masses. Cardiac: RRR, no murmurs, rubs, or gallops. No clubbing, cyanosis, edema.  Radials/DP/PT 2+ and equal bilaterally. No radial site hematoma. Respiratory:   Respirations regular and unlabored, clear to auscultation bilaterally. GI: Soft, nontender, nondistended, BS + x 4. MS: no deformity or atrophy. Skin: warm and dry, no rash. Neuro:  Strength and sensation are intact. Psych: AAOx3.  Normal affect.  Labs    CBC  Recent Labs  05/03/16 2022 05/03/16 2028 05/04/16 0504  WBC 7.1  --  6.2  HGB 13.5 14.6 12.3*  HCT 42.5 43.0 39.5  MCV 91.0  --  89.6  PLT 229  --  200   Basic Metabolic Panel  Recent Labs  05/03/16 2022 05/03/16 2028  NA 141 143  K 3.9 4.1  CL 109 111  CO2 26  --   GLUCOSE 162* 158*  BUN 15 16  CREATININE 1.35* 1.40*  CALCIUM 10.3  --    Liver Function Tests No results for input(s): AST, ALT, ALKPHOS, BILITOT, PROT, ALBUMIN in the last 72 hours. No results for input(s): LIPASE, AMYLASE in the last 72 hours. Cardiac Enzymes  Recent Labs  05/03/16 2314 05/04/16 0504  TROPONINI 6.31* 15.01*   BNP Invalid input(s): POCBNP  D-Dimer No results for input(s): DDIMER in the last 72 hours. Hemoglobin A1C No results for input(s): HGBA1C in the last 72 hours. Fasting Lipid Panel No results for input(s): CHOL, HDL, LDLCALC, TRIG, CHOLHDL, LDLDIRECT in the last 72 hours. Thyroid Function Tests No results for input(s): TSH, T4TOTAL, T3FREE, THYROIDAB in the last 72 hours.  Invalid input(s): FREET3  Telemetry    NSR with frequent PVCs, couplets. Short 4-6 beat runs of NSVT - Personally Reviewed  ECG    Pending - Personally Reviewed  Radiology    Dg Chest Portable 1 View  Result Date: 05/03/2016 CLINICAL DATA:  Centralized chest pain EXAM: PORTABLE CHEST 1 VIEW COMPARISON:  04/17/2016 FINDINGS: Left pacer patch is obscure the lower chest and left lung base. Mildly low lung volumes. Stable cardiomegaly. No overt edema. No gross consolidations. IMPRESSION: Left lung base obscured by overlying devices. Stable cardiomegaly without overt failure Electronically Signed   By: Jasmine PangKim  Fujinaga M.D.   On: 05/03/2016  20:50    Cardiac Studies   Procedures   Left Heart Cath and Coronary Angiography  Conclusion     LV end diastolic pressure is mildly elevated.  Ostial LAD stenosis- 70 %stenosed.  Prox LAD to Mid LAD lesion, 35 %stenosed.  1st Diag lesion, 85 %stenosed.  Ost Cx to Prox Cx lesion, 95 %stenosed.  Ost 1st Mrg to 1st Mrg lesion, 70 %stenosed.  Ost 2nd Mrg lesion, 100 %stenosed.  Prox RCA-2 lesion, 80 %stenosed.  Prox RCA-1 lesion, 70 %stenosed.   1. Severe 3 vessel obstructive CAD. There is left main equivalent disease. Compared to prior study in May 2016 there is a ruptured plaque at the ostium of the LCx that extends into the ostial LAD. 2. Mildly elevated LVEDP  Plan: the patient is currently pain free and hemodynamically stable. Will transfer to ICU. Resume IV heparin tonight. Continue IV amiodarone. Hold Plavix. Will need CT surgery consult in the next couple of days for consideration of CABG. Anatomy is not favorable for PCI. Will assess LV function with Echo.      Patient Profile     82 year old man with a known history of coronary artery disease. [facility]. He had a stress test which showed some inferoapical ischemia and ejection fraction of 32%. Cath in May 2016 showed severe 2 V CAD managed medically. He saw his cardiologist, Dr. Diona BrownerMcDowell couple of days ago and medical therapy was adjusted. Now presents with NSTEMI and NSVT  Assessment & Plan    1. NSTEMI. Ecg with LBBB. Peak troponin 15. Severe 3 vessel CAD now with left main equivalent disease. Poor candidate for PCI. Recommend CABG. CT surgery consulted. Will need Plavix washout. Continue ASA, IV heparin. Check Echo. 2. Ischemic CM. Assess EF by Echo 3. NSVT. Continue amiodarone 4. HTN controlled 5. HLD on lipitor 6. DM on SSI.   Signed, Montez Cuda Swaz[place], MD  05/04/2016, 9:36 AM

## 2016-05-04 NOTE — Consult Note (Signed)
[address]       [place] [address]             [phone]      Cardiothoracic Surgery Consultation  Reason for Consult: Severe multi-vessel coronary artery disease Referring Physician: Dr. Peter Martinique  Allen [name] is an 82 y.o. male.  HPI:   The patient is an 82 year old gentleman with a history of hypertension, hyperlipidemia, and multivessel coronary artery disease by cath in 10/2014 treated medically. His LVEF at that time was 35%. He reports a 6-7 month history of substernal burning chest discomfort that has occurred with exertion and at rest. He was recently hospitalized at [facility] and a stress test showed inferoapical ischemia with an EF of 32%. He saw Dr. Domenic Polite a few days ago and his medical therapy was adjusted. Then he went to [employer] to buy some ice cream and developed severe pain that would not go away and he went to [facility]. He had some ST changes even with LBBB and troponin was positive. He was having intermittent runs of VT with severe CP and was transferred to [facility]. He was started on amiodarone prior to transfer. Since arrival here last pm he has had no CP. Cath shows high grade left main equivalent with a 70% ostial LAD, 95% ostial dominant LCX with 70% OM1 and occlusion of OM2, 80% and 70% proximal RCA lesions in a small vessel. LVEDP was 22 and LVgram was not done.  Past Medical History:  Diagnosis Date  . CAD (coronary artery disease)    Multivessel obstructive CAD involving the RCA, circumflex, and obtuse marginal system with only mild LAD disease May 2016  . Essential hypertension   . Hyperlipidemia   . Ischemic cardiomyopathy    LVEF 35% May 2016  . LBBB (left bundle branch block)   . Sinus bradycardia     Past Surgical History:  Procedure Laterality Date  . CARDIAC CATHETERIZATION N/A 11/02/2014   Procedure: Left Heart Cath and Coronary Angiography;  Surgeon: Belva Crome, MD;  Location: [facility];  Service:  Cardiovascular;  Laterality: N/A;    Family History  Problem Relation Age of Onset  . Coronary artery disease Brother     Social History:  reports that he quit smoking about 4 years ago. He has never used smokeless tobacco. He reports that he does not drink alcohol or use drugs.  Allergies: No Known Allergies  Medications:  I have reviewed the patient's current medications. Prior to Admission:  Prescriptions Prior to Admission  Medication Sig Dispense Refill Last Dose  . amLODipine (NORVASC) 10 MG tablet TAKE 1 TABLET ONE TIME DAILY 90 tablet 3 maybe 11/22 at Unknown time  . aspirin 81 MG EC tablet Take 81 mg by mouth daily.   maybe 11/22 at Unknown time  . atorvastatin (LIPITOR) 80 MG tablet Take 1 tablet (80 mg total) by mouth daily at 6 PM. 90 tablet 3 maybe 11/21  . clopidogrel (PLAVIX) 75 MG tablet TAKE 1 TABLET ONE TIME DAILY WITH BREAKFAST 90 tablet 3 05/03/2016 at Unknown time  . glimepiride (AMARYL) 2 MG tablet Take 2 mg by mouth 2 (two) times daily.   05/03/2016 at am  . ibuprofen (ADVIL,MOTRIN) 200 MG tablet Take 200 mg by mouth daily as needed for headache (pain).   1-2 weeks ago  . Insulin Glargine (LANTUS SOLOSTAR) 100 UNIT/ML Solostar Pen Inject 50 Units into the skin at bedtime.  05/02/2016  . isosorbide mononitrate (IMDUR) 30 MG 24 hr tablet Take 30 mg by mouth daily.   maybe 11/22  . lisinopril (PRINIVIL,ZESTRIL) 40 MG tablet TAKE 1 TABLET EVERY DAY 90 tablet 3 maybe 11/22  . metFORMIN (GLUCOPHAGE) 500 MG tablet Take 1 tablet (500 mg total) by mouth 2 (two) times daily.   05/03/2016 at am  . nitroGLYCERIN (NITROSTAT) 0.4 MG SL tablet Place 1 tablet (0.4 mg total) under the tongue every 5 (five) minutes x 3 doses as needed for chest pain. If no relief after 3 rd, proceed to the ED for an evaluation 25 tablet 3 05/03/2016 at Unknown time  . pantoprazole (PROTONIX) 20 MG tablet Take 1 tablet (20 mg total) by mouth daily. 90 tablet 3 05/03/2016 at Unknown time  .  isosorbide mononitrate (IMDUR) 60 MG 24 hr tablet TAKE 1 TABLET ONE TIME DAILY (Patient not taking: Reported on 05/04/2016) 90 tablet 3 Not Taking at Unknown time   Scheduled: . amLODipine  5 mg Oral Daily  . aspirin EC  81 mg Oral Daily  . atorvastatin  80 mg Oral q1800  . glimepiride  2 mg Oral BID  . insulin aspart  0-15 Units Subcutaneous TID WC  . insulin glargine  50 Units Subcutaneous QHS  . isosorbide mononitrate  60 mg Oral Daily  . lisinopril  20 mg Oral Daily  . pantoprazole  20 mg Oral Daily  . sodium chloride flush  3 mL Intravenous Q12H   Continuous: . amiodarone 30 mg/hr (05/04/16 1354)  . heparin 1,000 Units/hr (05/04/16 1538)   BWG:YKZLDJ chloride, acetaminophen, nitroGLYCERIN, ondansetron (ZOFRAN) IV, sodium chloride flush Anti-infectives    None      Results for orders placed or performed during the hospital encounter of 05/03/16 (from the past 48 hour(s))  Basic metabolic panel     Status: Abnormal   Collection Time: 05/03/16  8:22 PM  Result Value Ref Range   Sodium 141 135 - 145 mmol/L   Potassium 3.9 3.5 - 5.1 mmol/L   Chloride 109 101 - 111 mmol/L   CO2 26 22 - 32 mmol/L   Glucose, Bld 162 (H) 65 - 99 mg/dL   BUN 15 6 - 20 mg/dL   Creatinine, Ser 1.35 (H) 0.61 - 1.24 mg/dL   Calcium 10.3 8.9 - 10.3 mg/dL   GFR calc non Af Amer 48 (L) >60 mL/min   GFR calc Af Amer 56 (L) >60 mL/min    Comment: (NOTE) The eGFR has been calculated using the CKD EPI equation. This calculation has not been validated in all clinical situations. eGFR's persistently <60 mL/min signify possible Chronic Kidney Disease.    Anion gap 6 5 - 15  CBC     Status: None   Collection Time: 05/03/16  8:22 PM  Result Value Ref Range   WBC 7.1 4.0 - 10.5 K/uL   RBC 4.67 4.22 - 5.81 MIL/uL   Hemoglobin 13.5 13.0 - 17.0 g/dL   HCT 42.5 39.0 - 52.0 %   MCV 91.0 78.0 - 100.0 fL   MCH 28.9 26.0 - 34.0 pg   MCHC 31.8 30.0 - 36.0 g/dL   RDW 14.9 11.5 - 15.5 %   Platelets 229 150 -  400 K/uL  I-stat troponin, ED     Status: Abnormal   Collection Time: 05/03/16  8:25 PM  Result Value Ref Range   Troponin i, poc 1.32 (HH) 0.00 - 0.08 ng/mL   Comment NOTIFIED PHYSICIAN  Comment 3            Comment: Due to the release kinetics of cTnI, a negative result within the first hours of the onset of symptoms does not rule out myocardial infarction with certainty. If myocardial infarction is still suspected, repeat the test at appropriate intervals.   I-Stat Chem 8, ED     Status: Abnormal   Collection Time: 05/03/16  8:28 PM  Result Value Ref Range   Sodium 143 135 - 145 mmol/L   Potassium 4.1 3.5 - 5.1 mmol/L   Chloride 111 101 - 111 mmol/L   BUN 16 6 - 20 mg/dL   Creatinine, Ser 1.40 (H) 0.61 - 1.24 mg/dL   Glucose, Bld 158 (H) 65 - 99 mg/dL   Calcium, Ion 1.15 1.15 - 1.40 mmol/L   TCO2 22 0 - 100 mmol/L   Hemoglobin 14.6 13.0 - 17.0 g/dL   HCT 43.0 39.0 - 52.0 %  MRSA PCR Screening     Status: None   Collection Time: 05/03/16 11:09 PM  Result Value Ref Range   MRSA by PCR NEGATIVE NEGATIVE    Comment:        The GeneXpert MRSA Assay (FDA approved for NASAL specimens only), is one component of a comprehensive MRSA colonization surveillance program. It is not intended to diagnose MRSA infection nor to guide or monitor treatment for MRSA infections.   Troponin I     Status: Abnormal   Collection Time: 05/03/16 11:14 PM  Result Value Ref Range   Troponin I 6.31 (HH) <0.03 ng/mL    Comment: CRITICAL RESULT CALLED TO, READ BACK BY AND VERIFIED WITH: AMO M,RN 05/04/16 0009 WAYK   Heparin level (unfractionated)     Status: Abnormal   Collection Time: 05/04/16  5:04 AM  Result Value Ref Range   Heparin Unfractionated <0.10 (L) 0.30 - 0.70 IU/mL    Comment:        IF HEPARIN RESULTS ARE BELOW EXPECTED VALUES, AND PATIENT DOSAGE HAS BEEN CONFIRMED, SUGGEST FOLLOW UP TESTING OF ANTITHROMBIN III LEVELS.   CBC     Status: Abnormal   Collection Time:  05/04/16  5:04 AM  Result Value Ref Range   WBC 6.2 4.0 - 10.5 K/uL   RBC 4.41 4.22 - 5.81 MIL/uL   Hemoglobin 12.3 (L) 13.0 - 17.0 g/dL   HCT 39.5 39.0 - 52.0 %   MCV 89.6 78.0 - 100.0 fL   MCH 27.9 26.0 - 34.0 pg   MCHC 31.1 30.0 - 36.0 g/dL   RDW 14.9 11.5 - 15.5 %   Platelets 200 150 - 400 K/uL  Troponin I     Status: Abnormal   Collection Time: 05/04/16  5:04 AM  Result Value Ref Range   Troponin I 15.01 (HH) <0.03 ng/mL    Comment: CRITICAL VALUE NOTED.  VALUE IS CONSISTENT WITH PREVIOUSLY REPORTED AND CALLED VALUE.  Glucose, capillary     Status: Abnormal   Collection Time: 05/04/16  8:26 AM  Result Value Ref Range   Glucose-Capillary 58 (L) 65 - 99 mg/dL  Glucose, capillary     Status: None   Collection Time: 05/04/16  8:48 AM  Result Value Ref Range   Glucose-Capillary 82 65 - 99 mg/dL  Troponin I     Status: Abnormal   Collection Time: 05/04/16 11:24 AM  Result Value Ref Range   Troponin I 15.13 (HH) <0.03 ng/mL    Comment: CRITICAL VALUE NOTED.  VALUE IS CONSISTENT WITH  PREVIOUSLY REPORTED AND CALLED VALUE.  Magnesium     Status: None   Collection Time: 05/04/16 11:24 AM  Result Value Ref Range   Magnesium 1.9 1.7 - 2.4 mg/dL  Glucose, capillary     Status: None   Collection Time: 05/04/16 12:27 PM  Result Value Ref Range   Glucose-Capillary 84 65 - 99 mg/dL  Heparin level (unfractionated)     Status: Abnormal   Collection Time: 05/04/16  2:57 PM  Result Value Ref Range   Heparin Unfractionated 0.26 (L) 0.30 - 0.70 IU/mL    Comment:        IF HEPARIN RESULTS ARE BELOW EXPECTED VALUES, AND PATIENT DOSAGE HAS BEEN CONFIRMED, SUGGEST FOLLOW UP TESTING OF ANTITHROMBIN III LEVELS.   Glucose, capillary     Status: Abnormal   Collection Time: 05/04/16  4:23 PM  Result Value Ref Range   Glucose-Capillary 122 (H) 65 - 99 mg/dL    Dg Chest Portable 1 View  Result Date: 05/03/2016 CLINICAL DATA:  Centralized chest pain EXAM: PORTABLE CHEST 1 VIEW COMPARISON:   04/17/2016 FINDINGS: Left pacer patch is obscure the lower chest and left lung base. Mildly low lung volumes. Stable cardiomegaly. No overt edema. No gross consolidations. IMPRESSION: Left lung base obscured by overlying devices. Stable cardiomegaly without overt failure Electronically Signed   By: Donavan Foil M.D.   On: 05/03/2016 20:50    Review of Systems  Constitutional: Positive for malaise/fatigue.  HENT: Negative.   Eyes: Negative.   Respiratory: Negative for cough and shortness of breath.   Cardiovascular: Positive for chest pain and palpitations. Negative for orthopnea, leg swelling and PND.  Gastrointestinal: Negative.   Genitourinary: Negative.   Musculoskeletal: Negative.   Skin: Negative.   Neurological: Negative.   Endo/Heme/Allergies: Negative.   Psychiatric/Behavioral: Negative.    Blood pressure (!) 144/78, pulse (!) 57, temperature 98.3 F (36.8 C), temperature source Oral, resp. rate 18, height 5' 2" (1.575 m), weight 81.6 kg (180 lb), SpO2 92 %. Physical Exam  Constitutional: He is oriented to person, place, and time. He appears well-developed and well-nourished. No distress.  HENT:  Head: Normocephalic and atraumatic.  Poor dentition  Eyes: EOM are normal. Pupils are equal, round, and reactive to light.  Neck: Normal range of motion. Neck supple. No JVD present. No thyromegaly present.  Cardiovascular: Normal rate, regular rhythm and intact distal pulses.   Murmur heard. 2/6 systolic murmur RSB  Respiratory: Breath sounds normal. No respiratory distress.  GI: Soft. Bowel sounds are normal. He exhibits no distension and no mass. There is no tenderness.  Musculoskeletal: Normal range of motion. He exhibits no edema.  Lymphadenopathy:    He has no cervical adenopathy.  Neurological: He is alert and oriented to person, place, and time. He has normal strength. No cranial nerve deficit or sensory deficit.  Skin: Skin is warm and dry.  Psychiatric: He has a normal  mood and affect.   NAEL PETROSYAN  Cardiac catheterization  Order# [id number]  Reading physician: Peter M Martinique, MD Ordering physician: Peter M Martinique, MD Study date: 05/03/16  Physicians   Panel Physicians Referring Physician Case Authorizing Physician  Peter M Martinique, MD (Primary)    Procedures   Left Heart Cath and Coronary Angiography  Conclusion     LV end diastolic pressure is mildly elevated.  Ostial LAD stenosis- 70 %stenosed.  Prox LAD to Mid LAD lesion, 35 %stenosed.  1st Diag lesion, 85 %stenosed.  Ost Cx to Prox Cx lesion,  95 %stenosed.  Ost 1st Mrg to 1st Mrg lesion, 70 %stenosed.  Ost 2nd Mrg lesion, 100 %stenosed.  Prox RCA-2 lesion, 80 %stenosed.  Prox RCA-1 lesion, 70 %stenosed.   1. Severe 3 vessel obstructive CAD. There is left main equivalent disease. Compared to prior study in May 2016 there is a ruptured plaque at the ostium of the LCx that extends into the ostial LAD. 2. Mildly elevated LVEDP  Plan: the patient is currently pain free and hemodynamically stable. Will transfer to ICU. Resume IV heparin tonight. Continue IV amiodarone. Hold Plavix. Will need CT surgery consult in the next couple of days for consideration of CABG. Anatomy is not favorable for PCI. Will assess LV function with Echo.   Indications   NSTEMI (non-ST elevated myocardial infarction) ([place]) [I21.4 (ICD-10-CM)]  Procedural Details/Technique   Technical Details Indication: 82 yo BM with known CAD is transferred from [facility] ED with NSTEMI with NSVT and severe chest pain.  Procedural Details: The right wrist was prepped, draped, and anesthetized with 1% lidocaine. Using the modified Seldinger technique, a 6 French slender sheath was introduced into the right radial artery. 3 mg of verapamil was administered through the sheath, weight-based unfractionated heparin was administered intravenously. Standard Judkins catheters were used for selective coronary  angiography and left ventricular pressures. Catheter exchanges were performed over an exchange length guidewire. There were no immediate procedural complications. A TR band was used for radial hemostasis at the completion of the procedure. The patient was transferred to the post catheterization recovery area for further monitoring.  Contrast: 55 cc   Estimated blood loss <50 mL.  During this procedure the patient was administered the following to achieve and maintain moderate conscious sedation: Versed 1 mg, Fentanyl 25 mcg, while the patient's heart rate, blood pressure, and oxygen saturation were continuously monitored. The period of conscious sedation was 17 minutes, of which I was present face-to-face 100% of this time.    Coronary Findings   Dominance: Right  Left Main  LM lesion, 70% stenosed.  Left Anterior Descending  Prox LAD to Mid LAD lesion, 35% stenosed. The lesion is segmental and eccentric.  First Diagonal Branch  Vessel is small in size.  1st Diag lesion, 85% stenosed. The lesion is segmental and tubular.  Left Circumflex  Vessel is small.  Ost Cx to Prox Cx lesion, 95% stenosed. The lesion is segmental, tubular and ulcerative.  First Obtuse Marginal Branch  Vessel is large in size.  Ost 1st Mrg to 1st Mrg lesion, 70% stenosed. The lesion is segmental.  Second Obtuse Marginal Branch  2nd Mrg filled by collaterals from Inf Sept. 2nd Mrg filled by collaterals from 1st Sept.  Ost 2nd Mrg lesion, 100% stenosed. The lesion is segmental.  Lateral Second Obtuse Marginal Branch  Lat 2nd Mrg filled by collaterals from Dist LAD.  Right Coronary Artery  Vessel is small.  Prox RCA-1 lesion, 70% stenosed. The lesion is type C, segmental and eccentric.  Prox RCA-2 lesion, 80% stenosed. The lesion is discrete and eccentric.  Right Posterior Descending Artery  Vessel is small in size.  Second Right Posterolateral  Vessel is small in size.  Left Heart   Left Ventricle LV end  diastolic pressure is mildly elevated.    Coronary Diagrams   Diagnostic Diagram     Implants     No implant documentation for this case.  PACS Images   Show images for Cardiac catheterization   Link to Procedure Log  Procedure Log    Hemo Data   Flowsheet Row Most Recent Value  AO Systolic Pressure 694 mmHg  AO Diastolic Pressure 70 mmHg  AO Mean 90 mmHg  LV Systolic Pressure 854 mmHg  LV Diastolic Pressure 12 mmHg  LV EDP 21 mmHg  Arterial Occlusion Pressure Extended Systolic Pressure 627 mmHg  Arterial Occlusion Pressure Extended Diastolic Pressure 68 mmHg  Arterial Occlusion Pressure Extended Mean Pressure 87 mmHg  Left Ventricular Apex Extended Systolic Pressure 035 mmHg  Left Ventricular Apex Extended Diastolic Pressure 12 mmHg  Left Ventricular Apex Extended EDP Pressure 22 mmHg    Assessment/Plan:  This 82 year old gentleman has severe 3-vessel coronary disease with unstable angina and NSTEMI. His echo in January 2017 showed mild calcific aortic stenosis with a mean AV gradient of 11 mm Hg and an EF of 45-50%. He will need a repeat echo to assess the aortic valve. I agree that CABG is the best treatment for this active gentleman. He was on Plavix at the time of admission and will require a 5 day period for Plavix washout prior to surgery. He will need an echo tomorrow and plan surgery early next week. He has had episodes of VT associated with the CP since presentation so he needs to stay in the hospital on heparin. I discussed the operative procedure with the patient  including alternatives, benefits and risks; including but not limited to bleeding, blood transfusion, infection, stroke, myocardial infarction, graft failure, heart block requiring a permanent pacemaker, organ dysfunction, and death.  Allen [name] understands and agrees to proceed.    I spent 60 minutes performing this consultation and > 50% of this time was spent face to face counseling and  coordinating the care of this patient's severe multi-vessel coronary artery disease.   Gaye Pollack 05/04/2016, 6:17 PM

## 2016-05-05 ENCOUNTER — Encounter (HOSPITAL_COMMUNITY): Payer: Self-pay | Admitting: Cardiology

## 2016-05-05 DIAGNOSIS — I10 Essential (primary) hypertension: Secondary | ICD-10-CM

## 2016-05-05 LAB — GLUCOSE, CAPILLARY
GLUCOSE-CAPILLARY: 36 mg/dL — AB (ref 65–99)
GLUCOSE-CAPILLARY: 87 mg/dL (ref 65–99)
GLUCOSE-CAPILLARY: 90 mg/dL (ref 65–99)
Glucose-Capillary: 36 mg/dL — CL (ref 65–99)
Glucose-Capillary: 134 mg/dL — ABNORMAL HIGH (ref 65–99)

## 2016-05-05 LAB — CBC
HEMATOCRIT: 37.5 % — AB (ref 39.0–52.0)
HEMOGLOBIN: 12.1 g/dL — AB (ref 13.0–17.0)
MCH: 28.7 pg (ref 26.0–34.0)
MCHC: 32.3 g/dL (ref 30.0–36.0)
MCV: 89.1 fL (ref 78.0–100.0)
Platelets: 186 10*3/uL (ref 150–400)
RBC: 4.21 MIL/uL — ABNORMAL LOW (ref 4.22–5.81)
RDW: 15.2 % (ref 11.5–15.5)
WBC: 5.7 10*3/uL (ref 4.0–10.5)

## 2016-05-05 LAB — HEPARIN LEVEL (UNFRACTIONATED)
Heparin Unfractionated: 0.32 IU/mL (ref 0.30–0.70)
Heparin Unfractionated: 0.59 IU/mL (ref 0.30–0.70)

## 2016-05-05 MED FILL — Nitroglycerin IV Soln 100 MCG/ML in D5W: INTRA_ARTERIAL | Qty: 10 | Status: AC

## 2016-05-05 NOTE — Progress Notes (Signed)
ANTICOAGULATION CONSULT NOTE - Follow Up Consult  Pharmacy Consult for Heparin  Indication: 3-vessel CAD awaiting CABG  No Known Allergies  Patient Measurements: Height: 5\' 2"  (157.5 cm) Weight: 180 lb (81.6 kg) IBW/kg (Calculated) : 54.6 Vital Signs: Temp: 97.8 F (36.6 C) (11/23 2322) Temp Source: Oral (11/23 2322) BP: 141/74 (11/24 0000) Pulse Rate: 49 (11/24 0000)  Labs:  Recent Labs  05/03/16 2022 05/03/16 2028 05/03/16 2314 05/04/16 0504 05/04/16 1124 05/04/16 1457 05/05/16 0045  HGB 13.5 14.6  --  12.3*  --   --  12.1*  HCT 42.5 43.0  --  39.5  --   --  37.5*  PLT 229  --   --  200  --   --  186  HEPARINUNFRC  --   --   --  <0.10*  --  0.26* 0.32  CREATININE 1.35* 1.40*  --   --   --   --   --   TROPONINI  --   --  6.31* 15.01* 15.13*  --   --     Estimated Creatinine Clearance: 38.9 mL/min (by C-G formula based on SCr of 1.4 mg/dL (H)).  Assessment: 3-vessel CAD awaiting CABG after Plavix washout, heparin level therapeutic x after rate increase  Goal of Therapy:  Heparin level 0.3-0.7 units/ml Monitor platelets by anticoagulation protocol: Yes   Plan:  -Cont heparin at 1000 units/hr -1000 HL  Allen [name] 05/05/2016,1:48 AM

## 2016-05-05 NOTE — Progress Notes (Signed)
2 Days Post-Op Procedure(s) (LRB): Left Heart Cath and Coronary Angiography (N/A) Subjective:  No chest pain or shortness of breath  Objective: Vital signs in last 24 hours: Temp:  [97.5 F (36.4 C)-98.3 F (36.8 C)] 97.6 F (36.4 C) (11/24 1157) Pulse Rate:  [45-60] 45 (11/24 1157) Cardiac Rhythm: Heart block;Bundle branch block (11/24 0954) Resp:  [10-18] 17 (11/24 1157) BP: (106-151)/(65-113) 147/69 (11/24 1157) SpO2:  [92 %-100 %] 97 % (11/24 1157)  Hemodynamic parameters for last 24 hours:    Intake/Output from previous day: 11/23 0701 - 11/24 0700 In: 1626.8 [P.O.:900; I.V.:726.8] Out: 1075 [Urine:1075] Intake/Output this shift: No intake/output data recorded.  General appearance: alert and cooperative Heart: regular rate and rhythm, S1, S2 normal, no murmur, click, rub or gallop Lungs: clear to auscultation bilaterally  Lab Results:  Recent Labs  05/04/16 0504 05/05/16 0045  WBC 6.2 5.7  HGB 12.3* 12.1*  HCT 39.5 37.5*  PLT 200 186   BMET:  Recent Labs  05/03/16 2022 05/03/16 2028  NA 141 143  K 3.9 4.1  CL 109 111  CO2 26  --   GLUCOSE 162* 158*  BUN 15 16  CREATININE 1.35* 1.40*  CALCIUM 10.3  --     PT/INR: No results for input(s): LABPROT, INR in the last 72 hours. ABG    Component Value Date/Time   TCO2 22 05/03/2016 2028   CBG (last 3)   Recent Labs  05/05/16 0801 05/05/16 0847 05/05/16 1200  GLUCAP 36* 87 134*    Assessment/Plan:  Severe multivessel coronary artery disease.  Plan surgery on Monday after Plavix washout   LOS: 2 days    Alleen BorneBryan K Bartle 05/05/2016

## 2016-05-05 NOTE — Progress Notes (Signed)
Blod sugar 36. 0range juice and given with breakfast. Repeat CBG 87

## 2016-05-05 NOTE — Progress Notes (Signed)
Subjective:  No CP/SOB. Remains on IV Amio and Hep. For CABG on Monday with DR Bartle  Objective:  Temp:  [97.5 F (36.4 C)-98.3 F (36.8 C)] 97.7 F (36.5 C) (11/24 0756) Pulse Rate:  [48-61] 51 (11/24 0756) Resp:  [10-22] 17 (11/24 0756) BP: (106-151)/(64-113) 136/80 (11/24 0756) SpO2:  [92 %-100 %] 95 % (11/24 0756) Weight change:   Intake/Output from previous day: 11/23 0701 - 11/24 0700 In: 1626.8 [P.O.:900; I.V.:726.8] Out: 1075 [Urine:1075]  Intake/Output from this shift: No intake/output data recorded.  Physical Exam: General appearance: alert and no distress Neck: no adenopathy, no carotid bruit, no JVD, supple, symmetrical, trachea midline and thyroid not enlarged, symmetric, no tenderness/mass/nodules Lungs: clear to auscultation bilaterally Heart: regular rate and rhythm, S1, S2 normal, no murmur, click, rub or gallop Extremities: extremities normal, atraumatic, no cyanosis or edema  Lab Results: Results for orders placed or performed during the hospital encounter of 05/03/16 (from the past 48 hour(s))  Basic metabolic panel     Status: Abnormal   Collection Time: 05/03/16  8:22 PM  Result Value Ref Range   Sodium 141 135 - 145 mmol/L   Potassium 3.9 3.5 - 5.1 mmol/L   Chloride 109 101 - 111 mmol/L   CO2 26 22 - 32 mmol/L   Glucose, Bld 162 (H) 65 - 99 mg/dL   BUN 15 6 - 20 mg/dL   Creatinine, Ser 1.35 (H) 0.61 - 1.24 mg/dL   Calcium 10.3 8.9 - 10.3 mg/dL   GFR calc non Af Amer 48 (L) >60 mL/min   GFR calc Af Amer 56 (L) >60 mL/min    Comment: (NOTE) The eGFR has been calculated using the CKD EPI equation. This calculation has not been validated in all clinical situations. eGFR's persistently <60 mL/min signify possible Chronic Kidney Disease.    Anion gap 6 5 - 15  CBC     Status: None   Collection Time: 05/03/16  8:22 PM  Result Value Ref Range   WBC 7.1 4.0 - 10.5 K/uL   RBC 4.67 4.22 - 5.81 MIL/uL   Hemoglobin 13.5 13.0 - 17.0 g/dL   HCT 42.5 39.0 - 52.0 %   MCV 91.0 78.0 - 100.0 fL   MCH 28.9 26.0 - 34.0 pg   MCHC 31.8 30.0 - 36.0 g/dL   RDW 14.9 11.5 - 15.5 %   Platelets 229 150 - 400 K/uL  I-stat troponin, ED     Status: Abnormal   Collection Time: 05/03/16  8:25 PM  Result Value Ref Range   Troponin i, poc 1.32 (HH) 0.00 - 0.08 ng/mL   Comment NOTIFIED PHYSICIAN    Comment 3            Comment: Due to the release kinetics of cTnI, a negative result within the first hours of the onset of symptoms does not rule out myocardial infarction with certainty. If myocardial infarction is still suspected, repeat the test at appropriate intervals.   I-Stat Chem 8, ED     Status: Abnormal   Collection Time: 05/03/16  8:28 PM  Result Value Ref Range   Sodium 143 135 - 145 mmol/L   Potassium 4.1 3.5 - 5.1 mmol/L   Chloride 111 101 - 111 mmol/L   BUN 16 6 - 20 mg/dL   Creatinine, Ser 1.40 (H) 0.61 - 1.24 mg/dL   Glucose, Bld 158 (H) 65 - 99 mg/dL   Calcium, Ion 1.15 1.15 - 1.40 mmol/L  TCO2 22 0 - 100 mmol/L   Hemoglobin 14.6 13.0 - 17.0 g/dL   HCT 43.0 39.0 - 52.0 %  MRSA PCR Screening     Status: None   Collection Time: 05/03/16 11:09 PM  Result Value Ref Range   MRSA by PCR NEGATIVE NEGATIVE    Comment:        The GeneXpert MRSA Assay (FDA approved for NASAL specimens only), is one component of a comprehensive MRSA colonization surveillance program. It is not intended to diagnose MRSA infection nor to guide or monitor treatment for MRSA infections.   Troponin I     Status: Abnormal   Collection Time: 05/03/16 11:14 PM  Result Value Ref Range   Troponin I 6.31 (HH) <0.03 ng/mL    Comment: CRITICAL RESULT CALLED TO, READ BACK BY AND VERIFIED WITH: AMO M,RN 05/04/16 0009 WAYK   Heparin level (unfractionated)     Status: Abnormal   Collection Time: 05/04/16  5:04 AM  Result Value Ref Range   Heparin Unfractionated <0.10 (L) 0.30 - 0.70 IU/mL    Comment:        IF HEPARIN RESULTS ARE BELOW EXPECTED  VALUES, AND PATIENT DOSAGE HAS BEEN CONFIRMED, SUGGEST FOLLOW UP TESTING OF ANTITHROMBIN III LEVELS.   CBC     Status: Abnormal   Collection Time: 05/04/16  5:04 AM  Result Value Ref Range   WBC 6.2 4.0 - 10.5 K/uL   RBC 4.41 4.22 - 5.81 MIL/uL   Hemoglobin 12.3 (L) 13.0 - 17.0 g/dL   HCT 39.5 39.0 - 52.0 %   MCV 89.6 78.0 - 100.0 fL   MCH 27.9 26.0 - 34.0 pg   MCHC 31.1 30.0 - 36.0 g/dL   RDW 14.9 11.5 - 15.5 %   Platelets 200 150 - 400 K/uL  Troponin I     Status: Abnormal   Collection Time: 05/04/16  5:04 AM  Result Value Ref Range   Troponin I 15.01 (HH) <0.03 ng/mL    Comment: CRITICAL VALUE NOTED.  VALUE IS CONSISTENT WITH PREVIOUSLY REPORTED AND CALLED VALUE.  Glucose, capillary     Status: Abnormal   Collection Time: 05/04/16  8:26 AM  Result Value Ref Range   Glucose-Capillary 58 (L) 65 - 99 mg/dL  Glucose, capillary     Status: None   Collection Time: 05/04/16  8:48 AM  Result Value Ref Range   Glucose-Capillary 82 65 - 99 mg/dL  Troponin I     Status: Abnormal   Collection Time: 05/04/16 11:24 AM  Result Value Ref Range   Troponin I 15.13 (HH) <0.03 ng/mL    Comment: CRITICAL VALUE NOTED.  VALUE IS CONSISTENT WITH PREVIOUSLY REPORTED AND CALLED VALUE.  Magnesium     Status: None   Collection Time: 05/04/16 11:24 AM  Result Value Ref Range   Magnesium 1.9 1.7 - 2.4 mg/dL  Glucose, capillary     Status: None   Collection Time: 05/04/16 12:27 PM  Result Value Ref Range   Glucose-Capillary 84 65 - 99 mg/dL  Heparin level (unfractionated)     Status: Abnormal   Collection Time: 05/04/16  2:57 PM  Result Value Ref Range   Heparin Unfractionated 0.26 (L) 0.30 - 0.70 IU/mL    Comment:        IF HEPARIN RESULTS ARE BELOW EXPECTED VALUES, AND PATIENT DOSAGE HAS BEEN CONFIRMED, SUGGEST FOLLOW UP TESTING OF ANTITHROMBIN III LEVELS.   Glucose, capillary     Status: Abnormal   Collection Time:  05/04/16  4:23 PM  Result Value Ref Range   Glucose-Capillary 122 (H)  65 - 99 mg/dL  Glucose, capillary     Status: None   Collection Time: 05/04/16  9:36 PM  Result Value Ref Range   Glucose-Capillary 66 65 - 99 mg/dL  Glucose, capillary     Status: None   Collection Time: 05/04/16 10:07 PM  Result Value Ref Range   Glucose-Capillary 90 65 - 99 mg/dL  Heparin level (unfractionated)     Status: None   Collection Time: 05/05/16 12:45 AM  Result Value Ref Range   Heparin Unfractionated 0.32 0.30 - 0.70 IU/mL    Comment:        IF HEPARIN RESULTS ARE BELOW EXPECTED VALUES, AND PATIENT DOSAGE HAS BEEN CONFIRMED, SUGGEST FOLLOW UP TESTING OF ANTITHROMBIN III LEVELS.   CBC     Status: Abnormal   Collection Time: 05/05/16 12:45 AM  Result Value Ref Range   WBC 5.7 4.0 - 10.5 K/uL   RBC 4.21 (L) 4.22 - 5.81 MIL/uL   Hemoglobin 12.1 (L) 13.0 - 17.0 g/dL   HCT 37.5 (L) 39.0 - 52.0 %   MCV 89.1 78.0 - 100.0 fL   MCH 28.7 26.0 - 34.0 pg   MCHC 32.3 30.0 - 36.0 g/dL   RDW 15.2 11.5 - 15.5 %   Platelets 186 150 - 400 K/uL  Glucose, capillary     Status: Abnormal   Collection Time: 05/05/16  7:55 AM  Result Value Ref Range   Glucose-Capillary 36 (LL) 65 - 99 mg/dL   Comment 1 Call MD NNP PA CNM   Glucose, capillary     Status: Abnormal   Collection Time: 05/05/16  8:01 AM  Result Value Ref Range   Glucose-Capillary 36 (LL) 65 - 99 mg/dL   Comment 1 Notify RN    Comment 2 Document in Chart     Imaging: Imaging results have been reviewed  Tele- NSR with occassional PVCs   Assessment/Plan:   1. Principal Problem: 2.   NSTEMI (non-ST elevated myocardial infarction) ([place]) 3. Active Problems: 4.   Hypertension 5.   Hyperlipidemia 6.   Chronic kidney disease 7.   LBBB (left bundle branch block) 8.   Ischemic cardiomyopathy 9.   Time Spent Directly with Patient:  15 minutes  Length of Stay:  LOS: 2 days   S/P Cor angio by Dr Shirlee More radially on Wed for NSTEMI. Mod Severe LVD. Trop peaked at 15. On IV hep and Amio. No further NSVT. Exam benign.  Labs OK. Currently getting Plavix washout. Tentative CABG beginning of week with Dr. Cyndia Bent .   Allen [name] 05/05/2016, 8:28 AM

## 2016-05-05 NOTE — Progress Notes (Signed)
ANTICOAGULATION CONSULT NOTE  Pharmacy Consult for heparin  Indication: chest pain/ACS  No Known Allergies  Patient Measurements: Height: 5\' 2"  (157.5 cm) Weight: 180 lb (81.6 kg) IBW/kg (Calculated) : 54.6 Heparin Dosing Weight: 72kg  Vital Signs: Temp: 97.6 F (36.4 C) (11/24 1157) Temp Source: Oral (11/24 1157) BP: 147/69 (11/24 1157) Pulse Rate: 45 (11/24 1157)  Labs:  Recent Labs  05/03/16 2022 05/03/16 2028 05/03/16 2314  05/04/16 0504 05/04/16 1124 05/04/16 1457 05/05/16 0045 05/05/16 1038  HGB 13.5 14.6  --   --  12.3*  --   --  12.1*  --   HCT 42.5 43.0  --   --  39.5  --   --  37.5*  --   PLT 229  --   --   --  200  --   --  186  --   HEPARINUNFRC  --   --   --   < > <0.10*  --  0.26* 0.32 0.59  CREATININE 1.35* 1.40*  --   --   --   --   --   --   --   TROPONINI  --   --  6.31*  --  15.01* 15.13*  --   --   --   < > = values in this interval not displayed.  Estimated Creatinine Clearance: 38.9 mL/min (by C-G formula based on SCr of 1.4 mg/dL (H)).   Medical History: Past Medical History:  Diagnosis Date  . CAD (coronary artery disease)    Multivessel obstructive CAD involving the RCA, circumflex, and obtuse marginal system with only mild LAD disease May 2016  . Essential hypertension   . Hyperlipidemia   . Ischemic cardiomyopathy    LVEF 35% May 2016  . LBBB (left bundle branch block)   . Sinus bradycardia     Medications:  Prescriptions Prior to Admission  Medication Sig Dispense Refill Last Dose  . amLODipine (NORVASC) 10 MG tablet TAKE 1 TABLET ONE TIME DAILY 90 tablet 3 maybe 11/22 at Unknown time  . aspirin 81 MG EC tablet Take 81 mg by mouth daily.   maybe 11/22 at Unknown time  . atorvastatin (LIPITOR) 80 MG tablet Take 1 tablet (80 mg total) by mouth daily at 6 PM. 90 tablet 3 maybe 11/21  . clopidogrel (PLAVIX) 75 MG tablet TAKE 1 TABLET ONE TIME DAILY WITH BREAKFAST 90 tablet 3 05/03/2016 at Unknown time  . glimepiride (AMARYL) 2 MG  tablet Take 2 mg by mouth 2 (two) times daily.   05/03/2016 at am  . ibuprofen (ADVIL,MOTRIN) 200 MG tablet Take 200 mg by mouth daily as needed for headache (pain).   1-2 weeks ago  . Insulin Glargine (LANTUS SOLOSTAR) 100 UNIT/ML Solostar Pen Inject 50 Units into the skin at bedtime.   05/02/2016  . isosorbide mononitrate (IMDUR) 30 MG 24 hr tablet Take 30 mg by mouth daily.   maybe 11/22  . lisinopril (PRINIVIL,ZESTRIL) 40 MG tablet TAKE 1 TABLET EVERY DAY 90 tablet 3 maybe 11/22  . metFORMIN (GLUCOPHAGE) 500 MG tablet Take 1 tablet (500 mg total) by mouth 2 (two) times daily.   05/03/2016 at am  . nitroGLYCERIN (NITROSTAT) 0.4 MG SL tablet Place 1 tablet (0.4 mg total) under the tongue every 5 (five) minutes x 3 doses as needed for chest pain. If no relief after 3 rd, proceed to the ED for an evaluation 25 tablet 3 05/03/2016 at Unknown time  . pantoprazole (PROTONIX) 20 MG tablet  Take 1 tablet (20 mg total) by mouth daily. 90 tablet 3 05/03/2016 at Unknown time  . isosorbide mononitrate (IMDUR) 60 MG 24 hr tablet TAKE 1 TABLET ONE TIME DAILY (Patient not taking: Reported on 05/04/2016) 90 tablet 3 Not Taking at Unknown time    Assessment: 82 yo male s/p cath and for possible CABG (currently Plavix washout). Tentative CABG early next week.  Pharmacy consulted to dose heparin. No anticoagulants PTA. CBC is stable.   Heparin level therapeutic x 2 on 1000 units/hr.  No bleeding documented.    Goal of Therapy:  Heparin level 0.3-0.7 units/ml Monitor platelets by anticoagulation protocol: Yes   Plan:  -Continue heparin at 1000 units/hr  -Daily heparin level/CBC -Monitor s/sx bleeding    Bailey MechEmily Stewart, PharmD  PGY1 Pharmacy Resident Pager: [phone]  05/05/2016 12:20 PM

## 2016-05-06 ENCOUNTER — Inpatient Hospital Stay (HOSPITAL_COMMUNITY): Payer: Medicare PPO

## 2016-05-06 ENCOUNTER — Other Ambulatory Visit (HOSPITAL_COMMUNITY): Payer: Medicare PPO

## 2016-05-06 DIAGNOSIS — I350 Nonrheumatic aortic (valve) stenosis: Secondary | ICD-10-CM

## 2016-05-06 LAB — BASIC METABOLIC PANEL
ANION GAP: 7 (ref 5–15)
BUN: 12 mg/dL (ref 6–20)
CHLORIDE: 110 mmol/L (ref 101–111)
CO2: 25 mmol/L (ref 22–32)
Calcium: 10.5 mg/dL — ABNORMAL HIGH (ref 8.9–10.3)
Creatinine, Ser: 1.45 mg/dL — ABNORMAL HIGH (ref 0.61–1.24)
GFR calc Af Amer: 51 mL/min — ABNORMAL LOW (ref >60)
GFR, EST NON AFRICAN AMERICAN: 44 mL/min — AB (ref >60)
GLUCOSE: 39 mg/dL — AB (ref 65–99)
POTASSIUM: 4 mmol/L (ref 3.5–5.1)
Sodium: 142 mmol/L (ref 135–145)

## 2016-05-06 LAB — HEPARIN LEVEL (UNFRACTIONATED)
Heparin Unfractionated: 0.86 IU/mL — ABNORMAL HIGH (ref 0.30–0.70)
Heparin Unfractionated: 0.83 IU/mL — ABNORMAL HIGH (ref 0.30–0.70)
Heparin Unfractionated: 0.57 IU/mL (ref 0.30–0.70)

## 2016-05-06 LAB — CBC
HEMATOCRIT: 34 % — AB (ref 39.0–52.0)
HEMOGLOBIN: 10 g/dL — AB (ref 13.0–17.0)
MCH: 26 pg (ref 26.0–34.0)
MCHC: 29.4 g/dL — AB (ref 30.0–36.0)
MCV: 88.3 fL (ref 78.0–100.0)
Platelets: 197 10*3/uL (ref 150–400)
RBC: 3.85 MIL/uL — AB (ref 4.22–5.81)
RDW: 15.4 % (ref 11.5–15.5)
WBC: 5.7 10*3/uL (ref 4.0–10.5)

## 2016-05-06 LAB — GLUCOSE, CAPILLARY
GLUCOSE-CAPILLARY: 81 mg/dL (ref 65–99)
GLUCOSE-CAPILLARY: 77 mg/dL (ref 65–99)
GLUCOSE-CAPILLARY: 95 mg/dL (ref 65–99)
GLUCOSE-CAPILLARY: 96 mg/dL (ref 65–99)
GLUCOSE-CAPILLARY: 77 mg/dL (ref 65–99)
Glucose-Capillary: 105 mg/dL — ABNORMAL HIGH (ref 65–99)
Glucose-Capillary: 46 mg/dL — ABNORMAL LOW (ref 65–99)
Glucose-Capillary: 64 mg/dL — ABNORMAL LOW (ref 65–99)
Glucose-Capillary: 88 mg/dL (ref 65–99)
Glucose-Capillary: 96 mg/dL (ref 65–99)

## 2016-05-06 LAB — MAGNESIUM: Magnesium: 1.9 mg/dL (ref 1.7–2.4)

## 2016-05-06 LAB — ECHOCARDIOGRAM COMPLETE
HEIGHTINCHES: 62 in
Weight: 2880 oz

## 2016-05-06 MED ORDER — MAGNESIUM HYDROXIDE 400 MG/5ML PO SUSP
5.0000 mL | Freq: Every day | ORAL | Status: DC | PRN
  Administered 2016-05-06 – 2016-05-07 (×2): 5 mL via ORAL
  Filled 2016-05-06 (×2): qty 30

## 2016-05-06 MED ORDER — AMIODARONE HCL 200 MG PO TABS
400.0000 mg | ORAL_TABLET | Freq: Two times a day (BID) | ORAL | Status: DC
  Administered 2016-05-06: 400 mg via ORAL
  Filled 2016-05-06 (×2): qty 2

## 2016-05-06 NOTE — Progress Notes (Signed)
ANTICOAGULATION CONSULT NOTE - Follow Up Consult  Pharmacy Consult for heparin Indication: chest pain/ACS  Allergies  Allergen Reactions  . No Known Allergies     Patient Measurements: Height: 5\' 2"  (157.5 cm) Weight: 180 lb (81.6 kg) IBW/kg (Calculated) : 54.6 Heparin Dosing Weight: 71 kg  Vital Signs: Temp: 98.1 F (36.7 C) (11/25 1950) Temp Source: Oral (11/25 1950) BP: 134/68 (11/25 2000) Pulse Rate: 43 (11/25 2100)  Labs:  Recent Labs  05/03/16 2314  05/04/16 0504 05/04/16 1124  05/05/16 0045  05/06/16 0231 05/06/16 1158 05/06/16 2204  HGB  --   < > 12.3*  --   --  12.1*  --  10.0*  --   --   HCT  --   --  39.5  --   --  37.5*  --  34.0*  --   --   PLT  --   --  200  --   --  186  --  197  --   --   HEPARINUNFRC  --   --  <0.10*  --   < > 0.32  < > 0.86* 0.83* 0.57  CREATININE  --   --   --   --   --   --   --  1.45*  --   --   TROPONINI 6.31*  --  15.01* 15.13*  --   --   --   --   --   --   < > = values in this interval not displayed.  Estimated Creatinine Clearance: 37.6 mL/min (by C-G formula based on SCr of 1.45 mg/dL (H)).  Assessment: 82 yo M admitted 05/03/2016 with NSTEMI and planned CABG early next week. Pharmacy consulted to dose heparin.  Heparin level 0.57, therapeutic on 700 units/hr. No bleeding noted.   Goal of Therapy:  Heparin level 0.3-0.7 units/ml Monitor platelets by anticoagulation protocol: Yes   Plan:  -Continue heparin drip at 700 units/hr  - Monitor daily heparin level,CBC, and signs/symptoms of bleeding  Allen [name], RPh Clinical Pharmacist Pager: [phone] 05/06/2016,10:36 PM

## 2016-05-06 NOTE — Progress Notes (Signed)
Informed Allen PageLindsay roberts NP that patients HR is between 40's-60's staying around 48. Informed MP of oral amiodarone dose of 400 mg due now. Per NP Hold dose this once.

## 2016-05-06 NOTE — Progress Notes (Signed)
  Echocardiogram 2D Echocardiogram has been performed.  Dewitte Vannice L Androw 05/06/2016, 11:07 AM

## 2016-05-06 NOTE — Progress Notes (Signed)
Critical glucose level of 39 called to this nurse at approximately 0408. Lab value info passed on to assigned RN at approximately [phone].

## 2016-05-06 NOTE — Progress Notes (Signed)
ANTICOAGULATION CONSULT NOTE  Pharmacy Consult for heparin  Indication: chest pain/ACS  Allergies  Allergen Reactions  . No Known Allergies     Patient Measurements: Height: 5\' 2"  (157.5 cm) Weight: 180 lb (81.6 kg) IBW/kg (Calculated) : 54.6 Heparin Dosing Weight: 72kg  Vital Signs: Temp: 97.7 F (36.5 C) (11/25 0032) Temp Source: Oral (11/25 0032) BP: 96/68 (11/25 0300) Pulse Rate: 47 (11/25 0300)  Labs:  Recent Labs  05/03/16 2022 05/03/16 2028 05/03/16 2314 05/04/16 0504 05/04/16 1124  05/05/16 0045 05/05/16 1038 05/06/16 0231  HGB 13.5 14.6  --  12.3*  --   --  12.1*  --  10.0*  HCT 42.5 43.0  --  39.5  --   --  37.5*  --  34.0*  PLT 229  --   --  200  --   --  186  --  197  HEPARINUNFRC  --   --   --  <0.10*  --   < > 0.32 0.59 0.86*  CREATININE 1.35* 1.40*  --   --   --   --   --   --   --   TROPONINI  --   --  6.31* 15.01* 15.13*  --   --   --   --   < > = values in this interval not displayed.  Estimated Creatinine Clearance: 38.9 mL/min (by C-G formula based on SCr of 1.4 mg/dL (H)).  Assessment: 82 y.o. male with CAD awaiting CABG for heparin  Goal of Therapy:  Heparin level 0.3-0.7 units/ml Monitor platelets by anticoagulation protocol: Yes   Plan:  Decrease Heparin 850 units/hr  Check heparin level in 8 hours.  Allen [name], PharmD, BCPS   05/06/2016 3:47 AM

## 2016-05-06 NOTE — Progress Notes (Signed)
ANTICOAGULATION CONSULT NOTE - Follow Up Consult  Pharmacy Consult for heparin Indication: chest pain/ACS  Allergies  Allergen Reactions  . No Known Allergies     Patient Measurements: Height: 5\' 2"  (157.5 cm) Weight: 180 lb (81.6 kg) IBW/kg (Calculated) : 54.6 Heparin Dosing Weight: 71 kg  Vital Signs: Temp: 98.1 F (36.7 C) (11/25 1224) Temp Source: Oral (11/25 1224) BP: 134/71 (11/25 1224) Pulse Rate: 49 (11/25 1200)  Labs:  Recent Labs  05/03/16 2022 05/03/16 2028 05/03/16 2314 05/04/16 0504 05/04/16 1124  05/05/16 0045 05/05/16 1038 05/06/16 0231  HGB 13.5 14.6  --  12.3*  --   --  12.1*  --  10.0*  HCT 42.5 43.0  --  39.5  --   --  37.5*  --  34.0*  PLT 229  --   --  200  --   --  186  --  197  HEPARINUNFRC  --   --   --  <0.10*  --   < > 0.32 0.59 0.86*  CREATININE 1.35* 1.40*  --   --   --   --   --   --  1.45*  TROPONINI  --   --  6.31* 15.01* 15.13*  --   --   --   --   < > = values in this interval not displayed.  Estimated Creatinine Clearance: 37.6 mL/min (by C-G formula based on SCr of 1.45 mg/dL (H)).  Assessment: 82 yo M admitted 05/03/2016 with NSTEMI and planned CABG early next week. Pharmacy consulted to dose heparin. Heparin level remains supratherapeutic, will decrease rate.  Heparin level 0.83 (supratherapeutic), Hgb 10.0 slowly trending down, Plt wnl. No s/sx of bleeding noted per RN.  Goal of Therapy:  Heparin level 0.3-0.7 units/ml Monitor platelets by anticoagulation protocol: Yes   Plan:  - Decrease heparin to 700 units/hr  - Check 8hr heparin level - Monitor daily heparin level,CBC, and signs/symptoms of bleeding  Casilda Carlsaylor Aribella Vavra, PharmD. PGY-2 Infectious Diseases Pharmacy Resident Pager: [phone] 05/06/2016,12:25 PM

## 2016-05-06 NOTE — Progress Notes (Signed)
Brief Overnight Note  Noted to be in sinus, with HR in 40-50s w/o any NSVT for > 24hrs. Will convert from IV to oral amiodarone.  Yehuda SavannahJedrek Iley Breeden, MD

## 2016-05-06 NOTE — Progress Notes (Signed)
Patient Name: Mosetta PuttSamuel J Murphey      SUBJECTIVE: 82 year old gentleman hospitalized 11/22 for chest pain. Had a non-STEMI and runs of nonsustained ventricular tachycardia prompting initiation of amiodarone. Catheterization demonstrated left main disease EF 35%  CABG anticipated   Some bradycardia prompting holding amiodarone   No chest pain or shortness of breath    Past Medical History:  Diagnosis Date  . CAD (coronary artery disease)    Multivessel obstructive CAD involving the RCA, circumflex, and obtuse marginal system with only mild LAD disease May 2016  . Essential hypertension   . Hyperlipidemia   . Ischemic cardiomyopathy    LVEF 35% May 2016  . LBBB (left bundle branch block)   . Sinus bradycardia     Scheduled Meds:  Scheduled Meds: . amiodarone  400 mg Oral BID  . amLODipine  5 mg Oral Daily  . aspirin EC  81 mg Oral Daily  . atorvastatin  80 mg Oral q1800  . glimepiride  2 mg Oral BID  . insulin aspart  0-15 Units Subcutaneous TID WC  . insulin glargine  50 Units Subcutaneous QHS  . isosorbide mononitrate  60 mg Oral Daily  . lisinopril  20 mg Oral Daily  . pantoprazole  20 mg Oral Daily  . sodium chloride flush  3 mL Intravenous Q12H   Continuous Infusions: . heparin 850 Units/hr (05/06/16 0700)   sodium chloride, acetaminophen, magnesium hydroxide, nitroGLYCERIN, ondansetron (ZOFRAN) IV, sodium chloride flush    PHYSICAL EXAM Vitals:   05/06/16 1000 05/06/16 1100 05/06/16 1200 05/06/16 1224  BP: (!) 163/73 (!) 135/93 134/71 134/71  Pulse: (!) 49 (!) 49 (!) 49   Resp: 17 14 15    Temp:    98.1 F (36.7 C)  TempSrc:    Oral  SpO2: 99% 100% 98%   Weight:      Height:        Well developed and nourished in no acute distress HENT normal Neck supple with JVP-flat Clear Regular rate and rhythm, no murmurs or gallops Abd-soft with active BS No Clubbing cyanosis edema Skin-warm and dry A & Oriented  Grossly normal sensory and motor  function    TELEMETRY: Reviewed personnally pt in sinus brady no recent VT NS     Intake/Output Summary (Last 24 hours) at 05/06/16 1232 Last data filed at 05/06/16 1200  Gross per 24 hour  Intake           836.75 ml  Output              750 ml  Net            86.75 ml    LABS: Basic Metabolic Panel:  Recent Labs Lab 05/03/16 2022 05/03/16 2028 05/04/16 1124 05/06/16 0231  NA 141 143  --  142  K 3.9 4.1  --  4.0  CL 109 111  --  110  CO2 26  --   --  25  GLUCOSE 162* 158*  --  39*  BUN 15 16  --  12  CREATININE 1.35* 1.40*  --  1.45*  CALCIUM 10.3  --   --  10.5*  MG  --   --  1.9 1.9   Cardiac Enzymes:  Recent Labs  05/03/16 2314 05/04/16 0504 05/04/16 1124  TROPONINI 6.31* 15.01* 15.13*   CBC:  Recent Labs Lab 05/03/16 2022 05/03/16 2028 05/04/16 0504 05/05/16 0045 05/06/16 0231  WBC 7.1  --  6.2  5.7 5.7  HGB 13.5 14.6 12.3* 12.1* 10.0*  HCT 42.5 43.0 39.5 37.5* 34.0*  MCV 91.0  --  89.6 89.1 88.3  PLT 229  --  200 186 197   PROTIME: No results for input(s): LABPROT, INR in the last 72 hours. Liver Function Tests: No results for input(s): AST, ALT, ALKPHOS, BILITOT, PROT, ALBUMIN in the last 72 hours. No results for input(s): LIPASE, AMYLASE in the last 72 hours. BNP: BNP (last 3 results) No results for input(s): BNP in the last 8760 hours.  ProBNP (last 3 results) No results for input(s): PROBNP in the last 8760 hours.   ASSESSMENT AND PLAN:  Principal Problem:   NSTEMI (non-ST elevated myocardial infarction) (HCC) Active Problems:   Hypertension   Hyperlipidemia   Chronic kidney disease   LBBB (left bundle branch block)   Ischemic cardiomyopathy Three-vessel coronary artery disease with anticipated CABG B Sinus bradycardia is new complicating the use of amiodarone. We will hold it for now.  BP variable   If HR better in am will add low dose BB    It might be worth thinking about the placing of an epicardial left ventricular  lead at the time of bypass surgery with his left bundle branch block and sinus node dysfunction which may end up requiring backup bradycardia pacing    Signed, Sherryl MangesSteven Klein MD  05/06/2016

## 2016-05-07 ENCOUNTER — Encounter (HOSPITAL_COMMUNITY): Payer: Self-pay

## 2016-05-07 LAB — GLUCOSE, CAPILLARY
GLUCOSE-CAPILLARY: 59 mg/dL — AB (ref 65–99)
GLUCOSE-CAPILLARY: 67 mg/dL (ref 65–99)
GLUCOSE-CAPILLARY: 135 mg/dL — AB (ref 65–99)
Glucose-Capillary: 88 mg/dL (ref 65–99)
Glucose-Capillary: 69 mg/dL (ref 65–99)
Glucose-Capillary: 119 mg/dL — ABNORMAL HIGH (ref 65–99)

## 2016-05-07 LAB — CBC
HCT: 37.6 % — ABNORMAL LOW (ref 39.0–52.0)
Hemoglobin: 11.7 g/dL — ABNORMAL LOW (ref 13.0–17.0)
MCH: 28.1 pg (ref 26.0–34.0)
MCHC: 31.1 g/dL (ref 30.0–36.0)
MCV: 90.2 fL (ref 78.0–100.0)
PLATELETS: 205 10*3/uL (ref 150–400)
RBC: 4.17 MIL/uL — AB (ref 4.22–5.81)
RDW: 15.4 % (ref 11.5–15.5)
WBC: 5 10*3/uL (ref 4.0–10.5)

## 2016-05-07 LAB — TYPE AND SCREEN
ABO/RH(D): O POS
Antibody Screen: NEGATIVE

## 2016-05-07 LAB — COMPREHENSIVE METABOLIC PANEL
ALBUMIN: 3.8 g/dL (ref 3.5–5.0)
ALT: 26 U/L (ref 17–63)
AST: 50 U/L — AB (ref 15–41)
Alkaline Phosphatase: 66 U/L (ref 38–126)
Anion gap: 8 (ref 5–15)
BUN: 15 mg/dL (ref 6–20)
CHLORIDE: 106 mmol/L (ref 101–111)
CO2: 25 mmol/L (ref 22–32)
CREATININE: 1.57 mg/dL — AB (ref 0.61–1.24)
Calcium: 11 mg/dL — ABNORMAL HIGH (ref 8.9–10.3)
GFR calc Af Amer: 46 mL/min — ABNORMAL LOW (ref >60)
GFR, EST NON AFRICAN AMERICAN: 40 mL/min — AB (ref >60)
GLUCOSE: 129 mg/dL — AB (ref 65–99)
Potassium: 4.6 mmol/L (ref 3.5–5.1)
Sodium: 139 mmol/L (ref 135–145)
Total Bilirubin: 0.4 mg/dL (ref 0.3–1.2)
Total Protein: 7 g/dL (ref 6.5–8.1)

## 2016-05-07 LAB — URINALYSIS, ROUTINE W REFLEX MICROSCOPIC
BILIRUBIN URINE: NEGATIVE
GLUCOSE, UA: NEGATIVE mg/dL
HGB URINE DIPSTICK: NEGATIVE
Ketones, ur: NEGATIVE mg/dL
Leukocytes, UA: NEGATIVE
Nitrite: NEGATIVE
PH: 6.5 (ref 5.0–8.0)
Protein, ur: NEGATIVE mg/dL
SPECIFIC GRAVITY, URINE: 1.014 (ref 1.005–1.030)

## 2016-05-07 LAB — BLOOD GAS, ARTERIAL
Acid-Base Excess: 2.7 mmol/L — ABNORMAL HIGH (ref 0.0–2.0)
Bicarbonate: 27 mmol/L (ref 20.0–28.0)
Drawn by: 301361
FIO2: 0.21
O2 Saturation: 90.6 %
PCO2 ART: 43.5 mmHg (ref 32.0–48.0)
PH ART: 7.41 (ref 7.350–7.450)
PO2 ART: 63.8 mmHg — AB (ref 83.0–108.0)
Patient temperature: 98.6

## 2016-05-07 LAB — SURGICAL PCR SCREEN
MRSA, PCR: NEGATIVE
Staphylococcus aureus: NEGATIVE

## 2016-05-07 LAB — PROTIME-INR
INR: 0.98
Prothrombin Time: 13 seconds (ref 11.4–15.2)

## 2016-05-07 LAB — ABO/RH: ABO/RH(D): O POS

## 2016-05-07 LAB — HEPARIN LEVEL (UNFRACTIONATED): Heparin Unfractionated: 0.5 IU/mL (ref 0.30–0.70)

## 2016-05-07 MED ORDER — PHENYLEPHRINE HCL 10 MG/ML IJ SOLN
30.0000 ug/min | INTRAVENOUS | Status: AC
  Administered 2016-05-08: 20 ug/min via INTRAVENOUS
  Administered 2016-05-08: 10 ug/min via INTRAVENOUS
  Filled 2016-05-07: qty 2

## 2016-05-07 MED ORDER — PLASMA-LYTE 148 IV SOLN
INTRAVENOUS | Status: AC
  Administered 2016-05-08: 500 mL
  Filled 2016-05-07: qty 2.5

## 2016-05-07 MED ORDER — EPINEPHRINE PF 1 MG/ML IJ SOLN
0.0000 ug/min | INTRAVENOUS | Status: DC
  Filled 2016-05-07: qty 4

## 2016-05-07 MED ORDER — MAGNESIUM SULFATE 50 % IJ SOLN
40.0000 meq | INTRAMUSCULAR | Status: DC
  Filled 2016-05-07: qty 10

## 2016-05-07 MED ORDER — VANCOMYCIN HCL 10 G IV SOLR
1250.0000 mg | INTRAVENOUS | Status: AC
  Administered 2016-05-08: 1250 mg via INTRAVENOUS
  Filled 2016-05-07: qty 1250

## 2016-05-07 MED ORDER — ALPRAZOLAM 0.25 MG PO TABS: 0.2500 mg | ORAL_TABLET | ORAL | Status: DC | PRN

## 2016-05-07 MED ORDER — PNEUMOCOCCAL VAC POLYVALENT 25 MCG/0.5ML IJ INJ: 0.5000 mL | INJECTION | INTRAMUSCULAR | Status: DC

## 2016-05-07 MED ORDER — INSULIN GLARGINE 100 UNIT/ML ~~LOC~~ SOLN
25.0000 [IU] | Freq: Every day | SUBCUTANEOUS | Status: DC
  Administered 2016-05-07: 25 [IU] via SUBCUTANEOUS
  Filled 2016-05-07 (×2): qty 0.25

## 2016-05-07 MED ORDER — ~~LOC~~ CARDIAC SURGERY, PATIENT & FAMILY EDUCATION
Freq: Once | Status: AC
  Administered 2016-05-07: 11:00:00
  Filled 2016-05-07: qty 1

## 2016-05-07 MED ORDER — DEXTROSE 5 % IV SOLN
750.0000 mg | INTRAVENOUS | Status: DC
  Filled 2016-05-07: qty 750

## 2016-05-07 MED ORDER — HEPARIN SODIUM (PORCINE) 1000 UNIT/ML IJ SOLN
INTRAMUSCULAR | Status: DC
  Filled 2016-05-07: qty 30

## 2016-05-07 MED ORDER — POTASSIUM CHLORIDE 2 MEQ/ML IV SOLN
80.0000 meq | INTRAVENOUS | Status: DC
  Filled 2016-05-07: qty 40

## 2016-05-07 MED ORDER — NITROGLYCERIN IN D5W 200-5 MCG/ML-% IV SOLN
2.0000 ug/min | INTRAVENOUS | Status: DC
  Filled 2016-05-07: qty 250

## 2016-05-07 MED ORDER — TRANEXAMIC ACID (OHS) PUMP PRIME SOLUTION
2.0000 mg/kg | INTRAVENOUS | Status: DC
  Filled 2016-05-07: qty 1.63

## 2016-05-07 MED ORDER — CHLORHEXIDINE GLUCONATE CLOTH 2 % EX PADS
6.0000 | MEDICATED_PAD | Freq: Once | CUTANEOUS | Status: AC
  Administered 2016-05-07: 6 via TOPICAL

## 2016-05-07 MED ORDER — TRANEXAMIC ACID 1000 MG/10ML IV SOLN
1.5000 mg/kg/h | INTRAVENOUS | Status: AC
  Administered 2016-05-08: 1.5 mg/kg/h via INTRAVENOUS
  Filled 2016-05-07: qty 25

## 2016-05-07 MED ORDER — BISACODYL 5 MG PO TBEC
5.0000 mg | DELAYED_RELEASE_TABLET | Freq: Once | ORAL | Status: DC
Start: 2016-05-07 — End: 2016-05-08

## 2016-05-07 MED ORDER — DOPAMINE-DEXTROSE 3.2-5 MG/ML-% IV SOLN
0.0000 ug/kg/min | INTRAVENOUS | Status: AC
  Administered 2016-05-08: 3 ug/kg/min via INTRAVENOUS
  Filled 2016-05-07: qty 250

## 2016-05-07 MED ORDER — DEXMEDETOMIDINE HCL IN NACL 400 MCG/100ML IV SOLN
0.1000 ug/kg/h | INTRAVENOUS | Status: AC
Start: 2016-05-08 — End: 2016-05-08
  Administered 2016-05-08: .3 ug/kg/h via INTRAVENOUS
  Filled 2016-05-07: qty 100

## 2016-05-07 MED ORDER — CHLORHEXIDINE GLUCONATE CLOTH 2 % EX PADS
6.0000 | MEDICATED_PAD | Freq: Once | CUTANEOUS | Status: AC
  Administered 2016-05-08: 6 via TOPICAL

## 2016-05-07 MED ORDER — METOPROLOL TARTRATE 12.5 MG HALF TABLET
12.5000 mg | ORAL_TABLET | Freq: Once | ORAL | Status: AC
  Administered 2016-05-08: 12.5 mg via ORAL
  Filled 2016-05-07: qty 1

## 2016-05-07 MED ORDER — DIAZEPAM 5 MG PO TABS
5.0000 mg | ORAL_TABLET | Freq: Once | ORAL | Status: AC
  Administered 2016-05-08: 5 mg via ORAL
  Filled 2016-05-07: qty 1

## 2016-05-07 MED ORDER — TRANEXAMIC ACID (OHS) BOLUS VIA INFUSION
15.0000 mg/kg | INTRAVENOUS | Status: AC
  Administered 2016-05-08: 1224 mg via INTRAVENOUS
  Filled 2016-05-07: qty 1224

## 2016-05-07 MED ORDER — DEXTROSE 5 % IV SOLN
1.5000 g | INTRAVENOUS | Status: AC
  Administered 2016-05-08: .75 g via INTRAVENOUS
  Administered 2016-05-08: 1.5 g via INTRAVENOUS
  Filled 2016-05-07: qty 1.5

## 2016-05-07 MED ORDER — TEMAZEPAM 15 MG PO CAPS: 15.0000 mg | ORAL_CAPSULE | Freq: Once | ORAL | Status: DC | PRN

## 2016-05-07 MED ORDER — SODIUM CHLORIDE 0.9 % IV SOLN
INTRAVENOUS | Status: AC
  Administered 2016-05-08: .7 [IU]/h via INTRAVENOUS
  Filled 2016-05-07: qty 2.5

## 2016-05-07 MED ORDER — CHLORHEXIDINE GLUCONATE 0.12 % MT SOLN
15.0000 mL | Freq: Once | OROMUCOSAL | Status: AC
  Administered 2016-05-08: 15 mL via OROMUCOSAL
  Filled 2016-05-07: qty 15

## 2016-05-07 NOTE — Progress Notes (Signed)
Patient Name: Allen [name]      SUBJECTIVE: 82 year old gentleman hospitalized 11/22 for chest pain. Had a non-STEMI and runs of nonsustained ventricular tachycardia prompting initiation of amiodarone. Catheterization demonstrated left main disease EF 35%  CABG anticipated   Some bradycardia prompting holding amiodarone   No chest pain or shortness of breath    Past Medical History:  Diagnosis Date  . CAD (coronary artery disease)    Multivessel obstructive CAD involving the RCA, circumflex, and obtuse marginal system with only mild LAD disease May 2016  . Essential hypertension   . Hyperlipidemia   . Ischemic cardiomyopathy    LVEF 35% May 2016  . LBBB (left bundle branch block)   . Sinus bradycardia     Scheduled Meds:  Scheduled Meds: . amLODipine  5 mg Oral Daily  . aspirin EC  81 mg Oral Daily  . atorvastatin  80 mg Oral q1800  . glimepiride  2 mg Oral BID  . going for heart surgery book   Does not apply Once  . insulin aspart  0-15 Units Subcutaneous TID WC  . insulin glargine  50 Units Subcutaneous QHS  . isosorbide mononitrate  60 mg Oral Daily  . lisinopril  20 mg Oral Daily  . pantoprazole  20 mg Oral Daily  . sodium chloride flush  3 mL Intravenous Q12H   Continuous Infusions: . heparin 700 Units/hr (05/07/16 0700)   sodium chloride, acetaminophen, magnesium hydroxide, nitroGLYCERIN, ondansetron (ZOFRAN) IV, sodium chloride flush    PHYSICAL EXAM Vitals:   05/06/16 2321 05/07/16 0000 05/07/16 0343 05/07/16 0803  BP:  127/69 132/68 131/70  Pulse:  (!) 43 (!) 43   Resp:  13 13 14   Temp: 97.5 F (36.4 C)  97.9 F (36.6 C) 97.9 F (36.6 C)  TempSrc: Oral  Oral Oral  SpO2:  99% 99% 95%  Weight:      Height:        Well developed and nourished in no acute distress HENT normal Neck supple with JVP-flat Clear Regular rate and rhythm, no murmurs or gallops Abd-soft with active BS No Clubbing cyanosis edema Skin-warm and dry A &  Oriented  Grossly normal sensory and motor function    TELEMETRY: Reviewed personnally pt in sinus brady no recent VT NS     Intake/Output Summary (Last 24 hours) at 05/07/16 1116 Last data filed at 05/07/16 0800  Gross per 24 hour  Intake           269.85 ml  Output              575 ml  Net          -305.15 ml    LABS: Basic Metabolic Panel:  Recent Labs Lab 05/03/16 2022 05/03/16 2028 05/04/16 1124 05/06/16 0231  NA 141 143  --  142  K 3.9 4.1  --  4.0  CL 109 111  --  110  CO2 26  --   --  25  GLUCOSE 162* 158*  --  39*  BUN 15 16  --  12  CREATININE 1.35* 1.40*  --  1.45*  CALCIUM 10.3  --   --  10.5*  MG  --   --  1.9 1.9   Cardiac Enzymes:  Recent Labs  05/04/16 1124  TROPONINI 15.13*   CBC:  Recent Labs Lab 05/03/16 2022 05/03/16 2028 05/04/16 0504 05/05/16 0045 05/06/16 0231 05/07/16 0247  WBC 7.1  --  6.2 5.7 5.7 5.0  HGB 13.5 14.6 12.3* 12.1* 10.0* 11.7*  HCT 42.5 43.0 39.5 37.5* 34.0* 37.6*  MCV 91.0  --  89.6 89.1 88.3 90.2  PLT 229  --  200 186 197 205   PROTIME: No results for input(s): LABPROT, INR in the last 72 hours. Liver Function Tests: No results for input(s): AST, ALT, ALKPHOS, BILITOT, PROT, ALBUMIN in the last 72 hours. No results for input(s): LIPASE, AMYLASE in the last 72 hours. BNP: BNP (last 3 results) No results for input(s): BNP in the last 8760 hours.  ProBNP (last 3 results) No results for input(s): PROBNP in the last 8760 hours.   ASSESSMENT AND PLAN:  Principal Problem:   NSTEMI (non-ST elevated myocardial infarction) (HCC) Active Problems:   Hypertension   Hyperlipidemia   Chronic kidney disease   LBBB (left bundle branch block)   Ischemic cardiomyopathy Three-vessel coronary artery disease with anticipated CABG B Sinus bradycardia is new complicating   Bradycardia is not particularly improved with the discontinuation of amiodarone.   It might be worth thinking about the placing of an epicardial  left ventricular lead at the time of bypass surgery with his left bundle branch block and sinus node dysfunction which may end up requiring backup bradycardia pacing    Signed, Sherryl MangesSteven Emauri Krygier MD  05/07/2016

## 2016-05-07 NOTE — Progress Notes (Signed)
Pt watched CABG video, received and read 2 cardiac surgery books, received education and provided teach back on incentive spirometer; procedure and blood consents signed and placed in chart. Tolerating RA well, VSS, NAD, will continue to monitor.

## 2016-05-07 NOTE — Progress Notes (Signed)
pts cbg = 59 gave juice pt asymptomatic.   Will continue to [employer] Britta Louth, CallawayJudy T

## 2016-05-07 NOTE — Progress Notes (Signed)
Per Dr. Graciela HusbandsKlein, since sugars keep dropping in the morning, d/c amaryl and decrease lantus to 25units QHS.

## 2016-05-07 NOTE — Progress Notes (Signed)
ANTICOAGULATION CONSULT NOTE - Follow Up Consult  Pharmacy Consult for heparin Indication: chest pain/ACS  Allergies  Allergen Reactions  . No Known Allergies     Patient Measurements: Height: 5\' 2"  (157.5 cm) Weight: 180 lb (81.6 kg) IBW/kg (Calculated) : 54.6 Heparin Dosing Weight: 71 kg  Vital Signs: Temp: 97.9 F (36.6 C) (11/26 0803) Temp Source: Oral (11/26 0803) BP: 131/70 (11/26 0803) Pulse Rate: 43 (11/26 0343)  Labs:  Recent Labs  05/04/16 1124  05/05/16 0045  05/06/16 0231 05/06/16 1158 05/06/16 2204 05/07/16 0247  HGB  --   < > 12.1*  --  10.0*  --   --  11.7*  HCT  --   --  37.5*  --  34.0*  --   --  37.6*  PLT  --   --  186  --  197  --   --  205  HEPARINUNFRC  --   < > 0.32  < > 0.86* 0.83* 0.57 0.50  CREATININE  --   --   --   --  1.45*  --   --   --   TROPONINI 15.13*  --   --   --   --   --   --   --   < > = values in this interval not displayed.  Estimated Creatinine Clearance: 37.6 mL/min (by C-G formula based on SCr of 1.45 mg/dL (H)).  Assessment: 82 yo M admitted 05/03/2016 with NSTEMI and planned CABG on 11/27. Pharmacy consulted to dose heparin.  Heparin level 0.50 (therapeutic), Hgb 11.7 stable,  Plt wnl. No s/sx of bleeding noted.  Goal of Therapy:  Heparin level 0.3-0.7 units/ml Monitor platelets by anticoagulation protocol: Yes   Plan:  - Continue heparin 700 units/hr  - Monitor daily heparin level,CBC, and signs/symptoms of bleeding  Casilda Carlsaylor Anirudh Baiz, PharmD. PGY-2 Infectious Diseases Pharmacy Resident Pager: [phone] 05/07/2016,8:40 AM

## 2016-05-07 NOTE — Progress Notes (Signed)
4 Days Post-Op Procedure(s) (LRB): Left Heart Cath and Coronary Angiography (N/A) Subjective: No chest pain or shortness of breath  Objective: Vital signs in last 24 hours: Temp:  [97.5 F (36.4 C)-98.1 F (36.7 C)] 97.8 F (36.6 C) (11/26 1235) Pulse Rate:  [43-53] 52 (11/26 1600) Cardiac Rhythm: Sinus bradycardia;Bundle branch block;Heart block (11/26 0800) Resp:  [12-21] 21 (11/26 1600) BP: (121-141)/(57-76) 125/75 (11/26 1600) SpO2:  [95 %-100 %] 97 % (11/26 1600)  Hemodynamic parameters for last 24 hours:    Intake/Output from previous day: 11/25 0701 - 11/26 0700 In: 536.9 [P.O.:360; I.V.:176.9] Out: 725 [Urine:725] Intake/Output this shift: Total I/O In: 536 [P.O.:480; I.V.:56] Out: 425 [Urine:425]  General appearance: alert and cooperative Heart: regular rate and rhythm, S1, S2 normal, no murmur, click, rub or gallop Lungs: clear to auscultation bilaterally  Lab Results:  Recent Labs  05/06/16 0231 05/07/16 0247  WBC 5.7 5.0  HGB 10.0* 11.7*  HCT 34.0* 37.6*  PLT 197 205   BMET:  Recent Labs  05/06/16 0231  NA 142  K 4.0  CL 110  CO2 25  GLUCOSE 39*  BUN 12  CREATININE 1.45*  CALCIUM 10.5*    PT/INR: No results for input(s): LABPROT, INR in the last 72 hours. ABG    Component Value Date/Time   TCO2 22 05/03/2016 2028   CBG (last 3)   Recent Labs  05/07/16 0428 05/07/16 0801 05/07/16 1033  GLUCAP 88 69 67          *[place]*                   *[facility]*                         [address]                        [place], [place] [address]                            [phone]  ------------------------------------------------------------------- Echocardiography  Patient:    Allen, [name] MR #:       [id number] Study Date: 05/06/2016 Gender:     M Age:        82 Height:     157.5 cm Weight:     81.6 kg BSA:        1.92 m^2 Pt. Status: Room:       4NP10C   ADMITTING    Peter [place]azil[place], M.D.  ORDERING     Evelene Croon, MD  REFERRING    Evelene Croon, MD  SONOGRAPHER  Perley Jain, RDCS  ATTENDING    Eber Hong [id number]  PERFORMING   [facility], Inpatient  SONOGRAPHER  Tobin Chad  cc:  ------------------------------------------------------------------- LV EF: 40% -   45%  ------------------------------------------------------------------- Indications:      Aortic stenosis 424.1.  ------------------------------------------------------------------- History:   PMH:   Mitral valve disease.  Risk factors:  Current tobacco use. Hypertension. Dyslipidemia.  ------------------------------------------------------------------- Study Conclusions  - Left ventricle: The cavity size was normal. There was mild   concentric hypertrophy. Systolic function was mildly to   moderately reduced. The estimated ejection fraction was in the   range of 40% to 45%. Diffuse hypokinesis. Doppler parameters are   consistent with abnormal left ventricular relaxation (grade 1   diastolic dysfunction). - Ventricular septum: Septal motion showed abnormal  function and   dyssynergy. These changes are consistent with intraventricular   conduction delay. - Aortic valve: There was very mild stenosis. There was mild   regurgitation. Valve area (VTI): 1.96 cm^2. Valve area (Vmax):   1.84 cm^2. Valve area (Vmean): 1.99 cm^2. - Mitral valve: There was mild regurgitation. - Atrial septum: No defect or patent foramen ovale was identified.  ------------------------------------------------------------------- Study data:  Comparison was made to the study of 06/23/2015.  Study status:  Routine.  Procedure:  The patient reported no pain pre or post test. Transthoracic echocardiography. Image quality was adequate.  Study completion:  There were no complications. Echocardiography.  M-mode, complete 2D, spectral Doppler, and color Doppler.  Birthdate:  Patient birthdate: [birth year].  Age:  Patient is 82 yr  old.  Sex:  Gender: male.    BMI: 32.9 kg/m^2.  Blood pressure:     145/71  Patient status:  Inpatient.  Study date: Study date: 05/06/2016. Study time: 10:01 AM.  Location:  Bedside.   -------------------------------------------------------------------  ------------------------------------------------------------------- Left ventricle:  The cavity size was normal. There was mild concentric hypertrophy. Systolic function was mildly to moderately reduced. The estimated ejection fraction was in the range of 40% to 45%. Diffuse hypokinesis. Doppler parameters are consistent with abnormal left ventricular relaxation (grade 1 diastolic dysfunction).  ------------------------------------------------------------------- Aortic valve:   Trileaflet; mildly thickened leaflets. Cusp separation was normal.  Doppler:   There was very mild stenosis. There was mild regurgitation.    VTI ratio of LVOT to aortic valve: 0.51. Valve area (VTI): 1.96 cm^2. Indexed valve area (VTI): 1.02 cm^2/m^2. Peak velocity ratio of LVOT to aortic valve: 0.48. Valve area (Vmax): 1.84 cm^2. Indexed valve area (Vmax): 0.96 cm^2/m^2. Mean velocity ratio of LVOT to aortic valve: 0.52. Valve area (Vmean): 1.99 cm^2. Indexed valve area (Vmean): 1.03 cm^2/m^2. Mean gradient (S): 11 mm Hg. Peak gradient (S): 26 mm Hg.  ------------------------------------------------------------------- Aorta:  Aortic root: The aortic root was normal in size. Ascending aorta: The ascending aorta was mildly dilated.  ------------------------------------------------------------------- Mitral valve:   Structurally normal valve.   Leaflet separation was normal.  Doppler:  Transvalvular velocity was within the normal range. There was no evidence for stenosis. There was mild regurgitation.    Peak gradient (D): 2 mm Hg.  ------------------------------------------------------------------- Atrial septum:  No defect or patent foramen ovale was  identified.   ------------------------------------------------------------------- Right ventricle:  The cavity size was normal. Systolic function was normal.  ------------------------------------------------------------------- Ventricular septum:   Septal motion showed abnormal function and dyssynergy. These changes are consistent with intraventricular conduction delay.  ------------------------------------------------------------------- Pulmonic valve:   Poorly visualized.  The valve appears to be grossly normal.    Doppler:  There was trivial regurgitation.  ------------------------------------------------------------------- Tricuspid valve:   Structurally normal valve.   Leaflet separation was normal.  Doppler:  Transvalvular velocity was within the normal range. There was no regurgitation.  ------------------------------------------------------------------- Pulmonary artery:    Systolic pressure could not be accurately estimated.  ------------------------------------------------------------------- Right atrium:  The atrium was normal in size.  ------------------------------------------------------------------- Pericardium:  There was no pericardial effusion.  ------------------------------------------------------------------- Systemic veins: Inferior vena cava: The vessel was normal in size. The respirophasic diameter changes were in the normal range (>= 50%), consistent with normal central venous pressure.  ------------------------------------------------------------------- Measurements   Left ventricle                            Value  Reference  LV ID, ED, PLAX chordal           (H)     54.3  mm       43 - 52  LV ID, ES, PLAX chordal           (H)     40.5  mm       23 - 38  LV fx shortening, PLAX chordal    (L)     25    %        >=29  LV PW thickness, ED                       13    mm       ---------  IVS/LV PW ratio, ED                       0.92            <=1.3  Stroke volume, 2D                         107   ml       ---------  Stroke volume/bsa, 2D                     56    ml/m^2   ---------  LV e&', lateral                            6.42  cm/s     ---------  LV E/e&', lateral                          11.03          ---------  LV e&', medial                             4.13  cm/s     ---------  LV E/e&', medial                           17.14          ---------  LV e&', average                            5.28  cm/s     ---------  LV E/e&', average                          13.42          ---------    Ventricular septum                        Value          Reference  IVS thickness, ED                         12    mm       ---------    LVOT  Value          Reference  LVOT ID, S                                22.1  mm       ---------  LVOT area                                 3.84  cm^2     ---------  LVOT peak velocity, S                     122   cm/s     ---------  LVOT mean velocity, S                     74.6  cm/s     ---------  LVOT VTI, S                               25.8  cm       ---------  LVOT peak gradient, S                     6     mm Hg    ---------  Stroke volume (SV), LVOT DP               99    ml       ---------  Stroke index (SV/bsa), LVOT DP            51.4  ml/m^2   ---------    Aortic valve                              Value          Reference  Aortic valve peak velocity, S             254   cm/s     ---------  Aortic valve mean velocity, S             144   cm/s     ---------  Aortic valve VTI, S                       50.5  cm       ---------  Aortic mean gradient, S                   11    mm Hg    ---------  Aortic peak gradient, S                   26    mm Hg    ---------  VTI ratio, LVOT/AV                        0.51           ---------  Aortic valve area, VTI                    1.96  cm^2     ---------  Aortic valve area/bsa, VTI                1.02   cm^2/m^2 ---------  Velocity ratio, peak, LVOT/AV             0.48           ---------  Aortic valve area, peak velocity          1.84  cm^2     ---------  Aortic valve area/bsa, peak               0.96  cm^2/m^2 ---------  velocity  Velocity ratio, mean, LVOT/AV             0.52           ---------  Aortic valve area, mean velocity          1.99  cm^2     ---------  Aortic valve area/bsa, mean               1.03  cm^2/m^2 ---------  velocity  Aortic regurg pressure half-time          1055  ms       ---------    Aorta                                     Value          Reference  Aortic root ID, ED                        39    mm       ---------    Left atrium                               Value          Reference  LA ID, A-P, ES                            43    mm       ---------  LA ID/bsa, A-P                    (H)     2.23  cm/m^2   <=2.2  LA volume, S                              64.1  ml       ---------  LA volume/bsa, S                          33.3  ml/m^2   ---------  LA volume, ES, 1-p A4C                    52.5  ml       ---------  LA volume/bsa, ES, 1-p A4C                27.3  ml/m^2   ---------  LA volume, ES, 1-p A2C                    73.9  ml       ---------  LA volume/bsa, ES, 1-p A2C                38.4  ml/m^2   ---------    Mitral valve  Value          Reference  Mitral E-wave peak velocity               70.8  cm/s     ---------  Mitral A-wave peak velocity               101   cm/s     ---------  Mitral deceleration time          (H)     322   ms       150 - 230  Mitral peak gradient, D                   2     mm Hg    ---------  Mitral E/A ratio, peak                    0.7            ---------    Systemic veins                            Value          Reference  Estimated CVP                             3     mm Hg    ---------    Right ventricle                           Value          Reference  TAPSE                                      33.6  mm       ---------  RV s&', lateral, S                         16.2  cm/s     ---------    Pulmonic valve                            Value          Reference  Pulmonic regurg velocity, ED              97.3  cm/s     ---------  Legend: (L)  and  (H)  mark values outside specified reference range.  ------------------------------------------------------------------- Prepared and Electronically Authenticated by  Thurmon FairMihai Croitoru, MD 2017-11-25T14:17:47   Assessment/Plan:  Severe multi-vessel CAD.  Echo yesterday personally reviewed and interpreted. The aortic valve looks ok with mild thickening of leaflets but good opening. The mean gradient was 11 mm Hg. There is mild AI and mild MR. I don't think the aortic valve requires any intervention. EF 40-45%. Plan CABG tomorrow. I discussed the operative procedure with the patient and his wife including alternatives, benefits and risks; including but not limited to bleeding, blood transfusion, infection, stroke, myocardial infarction, graft failure, heart block requiring a permanent pacemaker, organ dysfunction, and death.  Allen [name] understands and agrees to proceed.    LOS: 4 days    Alleen BorneBryan K Krystie Leiter 05/07/2016

## 2016-05-08 ENCOUNTER — Other Ambulatory Visit: Payer: Self-pay | Admitting: *Deleted

## 2016-05-08 ENCOUNTER — Inpatient Hospital Stay (HOSPITAL_COMMUNITY): Payer: Medicare PPO

## 2016-05-08 ENCOUNTER — Encounter (HOSPITAL_COMMUNITY): Payer: Self-pay | Admitting: Certified Registered Nurse Anesthetist

## 2016-05-08 ENCOUNTER — Inpatient Hospital Stay (HOSPITAL_COMMUNITY): Payer: Medicare PPO | Admitting: Certified Registered Nurse Anesthetist

## 2016-05-08 ENCOUNTER — Encounter (HOSPITAL_COMMUNITY)
Admission: EM | Disposition: A | Discharge status: Home / Self Care | Payer: Self-pay | Source: Home / Self Care | Attending: Surgery

## 2016-05-08 DIAGNOSIS — Z01810 Encounter for preprocedural cardiovascular examination: Secondary | ICD-10-CM

## 2016-05-08 DIAGNOSIS — I2510 Atherosclerotic heart disease of native coronary artery without angina pectoris: Secondary | ICD-10-CM

## 2016-05-08 DIAGNOSIS — Z951 Presence of aortocoronary bypass graft: Secondary | ICD-10-CM

## 2016-05-08 HISTORY — PX: CORONARY ARTERY BYPASS GRAFT: SHX141

## 2016-05-08 HISTORY — PX: TEE WITHOUT CARDIOVERSION: SHX5443

## 2016-05-08 HISTORY — PX: ENDOVEIN HARVEST OF GREATER SAPHENOUS VEIN: SHX5059

## 2016-05-08 LAB — CBC
HCT: 34.2 % — ABNORMAL LOW (ref 39.0–52.0)
HEMATOCRIT: 41.1 % (ref 39.0–52.0)
Hemoglobin: 13.1 g/dL (ref 13.0–17.0)
Hemoglobin: 11 g/dL — ABNORMAL LOW (ref 13.0–17.0)
MCH: 28.6 pg (ref 26.0–34.0)
MCH: 28.7 pg (ref 26.0–34.0)
MCHC: 31.9 g/dL (ref 30.0–36.0)
MCHC: 32.2 g/dL (ref 30.0–36.0)
MCV: 89.7 fL (ref 78.0–100.0)
MCV: 89.3 fL (ref 78.0–100.0)
PLATELETS: 186 10*3/uL (ref 150–400)
PLATELETS: 140 10*3/uL — AB (ref 150–400)
RBC: 4.58 MIL/uL (ref 4.22–5.81)
RBC: 3.83 MIL/uL — ABNORMAL LOW (ref 4.22–5.81)
RDW: 15.6 % — AB (ref 11.5–15.5)
RDW: 15.1 % (ref 11.5–15.5)
WBC: 4.8 10*3/uL (ref 4.0–10.5)
WBC: 10.3 10*3/uL (ref 4.0–10.5)

## 2016-05-08 LAB — GLUCOSE, CAPILLARY
GLUCOSE-CAPILLARY: 90 mg/dL (ref 65–99)
GLUCOSE-CAPILLARY: 109 mg/dL — AB (ref 65–99)
GLUCOSE-CAPILLARY: 161 mg/dL — AB (ref 65–99)
GLUCOSE-CAPILLARY: 149 mg/dL — AB (ref 65–99)
Glucose-Capillary: 100 mg/dL — ABNORMAL HIGH (ref 65–99)
Glucose-Capillary: 120 mg/dL — ABNORMAL HIGH (ref 65–99)
Glucose-Capillary: 152 mg/dL — ABNORMAL HIGH (ref 65–99)
Glucose-Capillary: 167 mg/dL — ABNORMAL HIGH (ref 65–99)

## 2016-05-08 LAB — APTT
APTT: 31 s (ref 24–36)
aPTT: 52 seconds — ABNORMAL HIGH (ref 24–36)

## 2016-05-08 LAB — BASIC METABOLIC PANEL
ANION GAP: 8 (ref 5–15)
BUN: 15 mg/dL (ref 6–20)
CHLORIDE: 106 mmol/L (ref 101–111)
CO2: 24 mmol/L (ref 22–32)
Calcium: 10.8 mg/dL — ABNORMAL HIGH (ref 8.9–10.3)
Creatinine, Ser: 1.44 mg/dL — ABNORMAL HIGH (ref 0.61–1.24)
GFR calc Af Amer: 51 mL/min — ABNORMAL LOW (ref >60)
GFR, EST NON AFRICAN AMERICAN: 44 mL/min — AB (ref >60)
GLUCOSE: 97 mg/dL (ref 65–99)
POTASSIUM: 4.9 mmol/L (ref 3.5–5.1)
Sodium: 138 mmol/L (ref 135–145)

## 2016-05-08 LAB — POCT I-STAT 3, ART BLOOD GAS (G3+)
Acid-Base Excess: 2 mmol/L (ref 0.0–2.0)
BICARBONATE: 29 mmol/L — AB (ref 20.0–28.0)
O2 Saturation: 97 %
TCO2: 31 mmol/L (ref 0–100)
pCO2 arterial: 54.7 mmHg — ABNORMAL HIGH (ref 32.0–48.0)
pH, Arterial: 7.326 — ABNORMAL LOW (ref 7.350–7.450)
pO2, Arterial: 95 mmHg (ref 83.0–108.0)

## 2016-05-08 LAB — VAS US DOPPLER PRE CABG
LEFT ECA DIAS: 0 cm/s
LEFT VERTEBRAL DIAS: 11 cm/s
LICADDIAS: -26 cm/s
LICAPDIAS: -27 cm/s
LICAPSYS: -92 cm/s
Left CCA dist dias: 11 cm/s
Left CCA dist sys: 43 cm/s
Left CCA prox dias: 11 cm/s
Left CCA prox sys: 84 cm/s
Left ICA dist sys: -89 cm/s
RIGHT ECA DIAS: -3 cm/s
RIGHT VERTEBRAL DIAS: 16 cm/s
Right CCA prox dias: -2 cm/s
Right CCA prox sys: -67 cm/s
Right cca dist sys: -47 cm/s

## 2016-05-08 LAB — PLATELET COUNT: PLATELETS: 111 10*3/uL — AB (ref 150–400)

## 2016-05-08 LAB — PROTIME-INR
INR: 1.21
Prothrombin Time: 15.4 seconds — ABNORMAL HIGH (ref 11.4–15.2)

## 2016-05-08 LAB — HEMOGLOBIN A1C
Hgb A1c MFr Bld: 5.9 % — ABNORMAL HIGH (ref 4.8–5.6)
Mean Plasma Glucose: 123 mg/dL

## 2016-05-08 LAB — POCT I-STAT 4, (NA,K, GLUC, HGB,HCT)
Glucose, Bld: 152 mg/dL — ABNORMAL HIGH (ref 65–99)
HEMATOCRIT: 33 % — AB (ref 39.0–52.0)
Hemoglobin: 11.2 g/dL — ABNORMAL LOW (ref 13.0–17.0)
Potassium: 6.1 mmol/L — ABNORMAL HIGH (ref 3.5–5.1)
Sodium: 136 mmol/L (ref 135–145)

## 2016-05-08 LAB — HEMOGLOBIN AND HEMATOCRIT, BLOOD
HEMATOCRIT: 25.9 % — AB (ref 39.0–52.0)
HEMOGLOBIN: 8.5 g/dL — AB (ref 13.0–17.0)

## 2016-05-08 LAB — HEPARIN LEVEL (UNFRACTIONATED): HEPARIN UNFRACTIONATED: 0.34 [IU]/mL (ref 0.30–0.70)

## 2016-05-08 SURGERY — CORONARY ARTERY BYPASS GRAFTING (CABG)
Anesthesia: General | Site: Leg Upper

## 2016-05-08 MED ORDER — PANTOPRAZOLE SODIUM 40 MG PO TBEC
40.0000 mg | DELAYED_RELEASE_TABLET | Freq: Every day | ORAL | Status: DC
  Administered 2016-05-10 – 2016-05-12 (×3): 40 mg via ORAL
  Filled 2016-05-08 (×3): qty 1

## 2016-05-08 MED ORDER — METOPROLOL TARTRATE 25 MG/10 ML ORAL SUSPENSION: 12.5000 mg | Freq: Two times a day (BID) | ORAL | Status: DC

## 2016-05-08 MED ORDER — SODIUM CHLORIDE 0.9 % IJ SOLN
INTRAMUSCULAR | Status: AC
  Filled 2016-05-08: qty 10

## 2016-05-08 MED ORDER — SODIUM CHLORIDE 0.9 % IV SOLN
30.0000 meq | Freq: Once | INTRAVENOUS | Status: DC
  Filled 2016-05-08: qty 15

## 2016-05-08 MED ORDER — MIDAZOLAM HCL 5 MG/5ML IJ SOLN
INTRAMUSCULAR | Status: DC | PRN
  Administered 2016-05-08: 3 mg via INTRAVENOUS
  Administered 2016-05-08: 1 mg via INTRAVENOUS
  Administered 2016-05-08 (×2): 3 mg via INTRAVENOUS

## 2016-05-08 MED ORDER — FUROSEMIDE 10 MG/ML IJ SOLN
INTRAMUSCULAR | Status: DC | PRN
  Administered 2016-05-08: 20 mg via INTRAMUSCULAR

## 2016-05-08 MED ORDER — LACTATED RINGERS IV SOLN
INTRAVENOUS | Status: DC
  Administered 2016-05-08: 20:00:00 via INTRAVENOUS

## 2016-05-08 MED ORDER — NITROGLYCERIN IN D5W 200-5 MCG/ML-% IV SOLN: 0.0000 ug/min | INTRAVENOUS | Status: DC

## 2016-05-08 MED ORDER — ACETAMINOPHEN 160 MG/5ML PO SOLN
650.0000 mg | Freq: Once | ORAL | Status: AC
  Administered 2016-05-08: 650 mg

## 2016-05-08 MED ORDER — HEPARIN SODIUM (PORCINE) 1000 UNIT/ML IJ SOLN
INTRAMUSCULAR | Status: DC | PRN
  Administered 2016-05-08: 30000 [IU] via INTRAVENOUS
  Administered 2016-05-08: 5000 [IU] via INTRAVENOUS

## 2016-05-08 MED ORDER — VECURONIUM BROMIDE 10 MG IV SOLR
INTRAVENOUS | Status: DC | PRN
  Administered 2016-05-08 (×3): 5 mg via INTRAVENOUS

## 2016-05-08 MED ORDER — THROMBIN 20000 UNITS EX SOLR
CUTANEOUS | Status: DC | PRN
  Administered 2016-05-08: 20000 [IU] via TOPICAL

## 2016-05-08 MED ORDER — LACTATED RINGERS IV SOLN
INTRAVENOUS | Status: DC | PRN
  Administered 2016-05-08 (×2): via INTRAVENOUS

## 2016-05-08 MED ORDER — BISACODYL 10 MG RE SUPP: 10.0000 mg | Freq: Every day | RECTAL | Status: DC

## 2016-05-08 MED ORDER — PROTAMINE SULFATE 10 MG/ML IV SOLN
INTRAVENOUS | Status: AC
  Filled 2016-05-08: qty 5

## 2016-05-08 MED ORDER — PROTAMINE SULFATE 10 MG/ML IV SOLN
INTRAVENOUS | Status: AC
  Filled 2016-05-08: qty 25

## 2016-05-08 MED ORDER — ROCURONIUM BROMIDE 10 MG/ML (PF) SYRINGE
PREFILLED_SYRINGE | INTRAVENOUS | Status: DC | PRN
  Administered 2016-05-08: 60 mg via INTRAVENOUS
  Administered 2016-05-08: 40 mg via INTRAVENOUS

## 2016-05-08 MED ORDER — MORPHINE SULFATE (PF) 2 MG/ML IV SOLN: 2.0000 mg | INTRAVENOUS | Status: DC | PRN

## 2016-05-08 MED ORDER — MAGNESIUM SULFATE 4 GM/100ML IV SOLN
4.0000 g | Freq: Once | INTRAVENOUS | Status: AC
  Administered 2016-05-08: 4 g via INTRAVENOUS
  Filled 2016-05-08: qty 100

## 2016-05-08 MED ORDER — FAMOTIDINE IN NACL 20-0.9 MG/50ML-% IV SOLN
20.0000 mg | Freq: Two times a day (BID) | INTRAVENOUS | Status: AC
  Administered 2016-05-08 – 2016-05-09 (×2): 20 mg via INTRAVENOUS
  Filled 2016-05-08: qty 50

## 2016-05-08 MED ORDER — SODIUM CHLORIDE 0.9% FLUSH: 3.0000 mL | INTRAVENOUS | Status: DC | PRN

## 2016-05-08 MED ORDER — SODIUM CHLORIDE 0.9% FLUSH: 10.0000 mL | INTRAVENOUS | Status: DC | PRN

## 2016-05-08 MED ORDER — HEMOSTATIC AGENTS (NO CHARGE) OPTIME
TOPICAL | Status: DC | PRN
  Administered 2016-05-08: 1 via TOPICAL

## 2016-05-08 MED ORDER — ACETAMINOPHEN 500 MG PO TABS
1000.0000 mg | ORAL_TABLET | Freq: Four times a day (QID) | ORAL | Status: DC
  Administered 2016-05-09 – 2016-05-12 (×11): 1000 mg via ORAL
  Filled 2016-05-08 (×12): qty 2

## 2016-05-08 MED ORDER — PROTAMINE SULFATE 10 MG/ML IV SOLN
INTRAVENOUS | Status: DC | PRN
  Administered 2016-05-08: 300 mg via INTRAVENOUS

## 2016-05-08 MED ORDER — MIDAZOLAM HCL 2 MG/2ML IJ SOLN: 2.0000 mg | INTRAMUSCULAR | Status: DC | PRN

## 2016-05-08 MED ORDER — SODIUM CHLORIDE 0.9 % IV SOLN: 250.0000 mL | INTRAVENOUS | Status: DC

## 2016-05-08 MED ORDER — LACTATED RINGERS IV SOLN
500.0000 mL | Freq: Once | INTRAVENOUS | Status: AC | PRN
  Administered 2016-05-08: 500 mL via INTRAVENOUS

## 2016-05-08 MED ORDER — LACTATED RINGERS IV SOLN
INTRAVENOUS | Status: DC | PRN
  Administered 2016-05-08 (×2): via INTRAVENOUS

## 2016-05-08 MED ORDER — SODIUM CHLORIDE 0.45 % IV SOLN: INTRAVENOUS | Status: DC | PRN

## 2016-05-08 MED ORDER — VANCOMYCIN HCL IN DEXTROSE 1-5 GM/200ML-% IV SOLN
1000.0000 mg | Freq: Once | INTRAVENOUS | Status: AC
  Administered 2016-05-09: 1000 mg via INTRAVENOUS
  Filled 2016-05-08: qty 200

## 2016-05-08 MED ORDER — PHENYLEPHRINE 40 MCG/ML (10ML) SYRINGE FOR IV PUSH (FOR BLOOD PRESSURE SUPPORT)
PREFILLED_SYRINGE | INTRAVENOUS | Status: AC
  Filled 2016-05-08: qty 10

## 2016-05-08 MED ORDER — DOCUSATE SODIUM 100 MG PO CAPS
200.0000 mg | ORAL_CAPSULE | Freq: Every day | ORAL | Status: DC
  Administered 2016-05-09 – 2016-05-10 (×2): 200 mg via ORAL
  Filled 2016-05-08 (×3): qty 2

## 2016-05-08 MED ORDER — FUROSEMIDE 10 MG/ML IJ SOLN
INTRAMUSCULAR | Status: AC
  Filled 2016-05-08: qty 4

## 2016-05-08 MED ORDER — ARTIFICIAL TEARS OP OINT
TOPICAL_OINTMENT | OPHTHALMIC | Status: DC | PRN
  Administered 2016-05-08: 1 via OPHTHALMIC

## 2016-05-08 MED ORDER — THROMBIN 20000 UNITS EX SOLR
CUTANEOUS | Status: AC
  Filled 2016-05-08: qty 20000

## 2016-05-08 MED ORDER — CHLORHEXIDINE GLUCONATE 0.12 % MT SOLN
15.0000 mL | OROMUCOSAL | Status: AC
  Administered 2016-05-08: 15 mL via OROMUCOSAL

## 2016-05-08 MED ORDER — ALBUMIN HUMAN 5 % IV SOLN
250.0000 mL | INTRAVENOUS | Status: AC | PRN
  Administered 2016-05-08 – 2016-05-09 (×2): 250 mL via INTRAVENOUS
  Filled 2016-05-08: qty 250

## 2016-05-08 MED ORDER — SODIUM CHLORIDE 0.9% FLUSH
10.0000 mL | Freq: Two times a day (BID) | INTRAVENOUS | Status: DC
  Administered 2016-05-08: 40 mL

## 2016-05-08 MED ORDER — INSULIN REGULAR HUMAN 100 UNIT/ML IJ SOLN
INTRAMUSCULAR | Status: DC
  Administered 2016-05-08: 1.8 [IU]/h via INTRAVENOUS
  Filled 2016-05-08 (×2): qty 2.5

## 2016-05-08 MED ORDER — GELATIN ABSORBABLE MT POWD
OROMUCOSAL | Status: DC | PRN
  Administered 2016-05-08 (×3): 4 mL via TOPICAL

## 2016-05-08 MED ORDER — ORAL CARE MOUTH RINSE
15.0000 mL | Freq: Four times a day (QID) | OROMUCOSAL | Status: DC
  Administered 2016-05-09 (×2): 15 mL via OROMUCOSAL

## 2016-05-08 MED ORDER — SUFENTANIL CITRATE 50 MCG/ML IV SOLN
INTRAVENOUS | Status: DC | PRN
  Administered 2016-05-08: 10 ug via INTRAVENOUS

## 2016-05-08 MED ORDER — DEXTROSE 5 % IV SOLN
1.5000 g | Freq: Two times a day (BID) | INTRAVENOUS | Status: AC
  Administered 2016-05-09 – 2016-05-10 (×4): 1.5 g via INTRAVENOUS
  Filled 2016-05-08 (×4): qty 1.5

## 2016-05-08 MED ORDER — FENTANYL CITRATE (PF) 250 MCG/5ML IJ SOLN
INTRAMUSCULAR | Status: AC
  Filled 2016-05-08: qty 20

## 2016-05-08 MED ORDER — CHLORHEXIDINE GLUCONATE 0.12% ORAL RINSE (MEDLINE KIT): 15.0000 mL | Freq: Two times a day (BID) | OROMUCOSAL | Status: DC

## 2016-05-08 MED ORDER — SODIUM CHLORIDE 0.9 % IV SOLN
INTRAVENOUS | Status: DC
  Administered 2016-05-08: 20:00:00 via INTRAVENOUS

## 2016-05-08 MED ORDER — DEXMEDETOMIDINE HCL IN NACL 200 MCG/50ML IV SOLN: 0.0000 ug/kg/h | INTRAVENOUS | Status: DC

## 2016-05-08 MED ORDER — MIDAZOLAM HCL 10 MG/2ML IJ SOLN
INTRAMUSCULAR | Status: AC
  Filled 2016-05-08: qty 2

## 2016-05-08 MED ORDER — ACETAMINOPHEN 650 MG RE SUPP: 650.0000 mg | Freq: Once | RECTAL | Status: AC

## 2016-05-08 MED ORDER — FENTANYL CITRATE (PF) 250 MCG/5ML IJ SOLN
INTRAMUSCULAR | Status: DC | PRN
  Administered 2016-05-08 (×3): 100 ug via INTRAVENOUS
  Administered 2016-05-08: 50 ug via INTRAVENOUS
  Administered 2016-05-08: 250 ug via INTRAVENOUS
  Administered 2016-05-08: 100 ug via INTRAVENOUS
  Administered 2016-05-08 (×2): 250 ug via INTRAVENOUS
  Administered 2016-05-08: 50 ug via INTRAVENOUS

## 2016-05-08 MED ORDER — SODIUM CHLORIDE 0.9% FLUSH
3.0000 mL | Freq: Two times a day (BID) | INTRAVENOUS | Status: DC
  Administered 2016-05-09 – 2016-05-11 (×5): 3 mL via INTRAVENOUS

## 2016-05-08 MED ORDER — INSULIN REGULAR BOLUS VIA INFUSION
0.0000 [IU] | Freq: Three times a day (TID) | INTRAVENOUS | Status: DC
  Filled 2016-05-08: qty 10

## 2016-05-08 MED ORDER — ONDANSETRON HCL 4 MG/2ML IJ SOLN
4.0000 mg | Freq: Four times a day (QID) | INTRAMUSCULAR | Status: DC | PRN
  Administered 2016-05-09 – 2016-05-10 (×2): 4 mg via INTRAVENOUS
  Filled 2016-05-08 (×2): qty 2

## 2016-05-08 MED ORDER — ALBUMIN HUMAN 5 % IV SOLN
INTRAVENOUS | Status: DC | PRN
  Administered 2016-05-08: 19:00:00 via INTRAVENOUS

## 2016-05-08 MED ORDER — PROPOFOL 10 MG/ML IV BOLUS
INTRAVENOUS | Status: DC | PRN
  Administered 2016-05-08: 60 mg via INTRAVENOUS

## 2016-05-08 MED ORDER — VECURONIUM BROMIDE 10 MG IV SOLR
INTRAVENOUS | Status: AC
  Filled 2016-05-08: qty 10

## 2016-05-08 MED ORDER — SUFENTANIL CITRATE 50 MCG/ML IV SOLN
INTRAVENOUS | Status: AC
  Filled 2016-05-08: qty 1

## 2016-05-08 MED ORDER — OXYCODONE HCL 5 MG PO TABS
5.0000 mg | ORAL_TABLET | ORAL | Status: DC | PRN
  Administered 2016-05-10: 10 mg via ORAL
  Filled 2016-05-08: qty 2

## 2016-05-08 MED ORDER — PROPOFOL 10 MG/ML IV BOLUS
INTRAVENOUS | Status: AC
  Filled 2016-05-08: qty 20

## 2016-05-08 MED ORDER — 0.9 % SODIUM CHLORIDE (POUR BTL) OPTIME
TOPICAL | Status: DC | PRN
  Administered 2016-05-08: 1000 mL
  Administered 2016-05-08: 6000 mL

## 2016-05-08 MED ORDER — ASPIRIN EC 325 MG PO TBEC
325.0000 mg | DELAYED_RELEASE_TABLET | Freq: Every day | ORAL | Status: DC
  Administered 2016-05-09: 325 mg via ORAL
  Filled 2016-05-08: qty 1

## 2016-05-08 MED ORDER — MORPHINE SULFATE (PF) 2 MG/ML IV SOLN
1.0000 mg | INTRAVENOUS | Status: AC | PRN
  Administered 2016-05-09: 2 mg via INTRAVENOUS
  Filled 2016-05-08: qty 1

## 2016-05-08 MED ORDER — FENTANYL CITRATE (PF) 250 MCG/5ML IJ SOLN
INTRAMUSCULAR | Status: AC
  Filled 2016-05-08: qty 5

## 2016-05-08 MED ORDER — ASPIRIN 81 MG PO CHEW: 324.0000 mg | CHEWABLE_TABLET | Freq: Every day | ORAL | Status: DC

## 2016-05-08 MED ORDER — METOPROLOL TARTRATE 5 MG/5ML IV SOLN: 2.5000 mg | INTRAVENOUS | Status: DC | PRN

## 2016-05-08 MED ORDER — METOPROLOL TARTRATE 12.5 MG HALF TABLET
12.5000 mg | ORAL_TABLET | Freq: Two times a day (BID) | ORAL | Status: DC
  Filled 2016-05-08: qty 1

## 2016-05-08 MED ORDER — BISACODYL 5 MG PO TBEC
10.0000 mg | DELAYED_RELEASE_TABLET | Freq: Every day | ORAL | Status: DC
  Administered 2016-05-09 – 2016-05-10 (×2): 10 mg via ORAL
  Filled 2016-05-08 (×3): qty 2

## 2016-05-08 MED ORDER — GLYCOPYRROLATE 0.2 MG/ML IJ SOLN
INTRAMUSCULAR | Status: DC | PRN
  Administered 2016-05-08: 0.1 mg via INTRAVENOUS
  Administered 2016-05-08: 0.2 mg via INTRAVENOUS

## 2016-05-08 MED ORDER — TRAMADOL HCL 50 MG PO TABS
50.0000 mg | ORAL_TABLET | ORAL | Status: DC | PRN
  Administered 2016-05-09: 50 mg via ORAL
  Administered 2016-05-09 – 2016-05-10 (×2): 100 mg via ORAL
  Filled 2016-05-08: qty 1
  Filled 2016-05-08 (×2): qty 2

## 2016-05-08 MED ORDER — ACETAMINOPHEN 160 MG/5ML PO SOLN: 1000.0000 mg | Freq: Four times a day (QID) | ORAL | Status: DC

## 2016-05-08 MED ORDER — DEXTROSE 5 % IV SOLN
0.0000 ug/min | INTRAVENOUS | Status: DC
  Filled 2016-05-08 (×2): qty 2

## 2016-05-08 SURGICAL SUPPLY — 106 items
BAG DECANTER FOR FLEXI CONT (MISCELLANEOUS) ×5 IMPLANT
BANDAGE ACE 4X5 VEL STRL LF (GAUZE/BANDAGES/DRESSINGS) ×5 IMPLANT
BANDAGE ACE 6X5 VEL STRL LF (GAUZE/BANDAGES/DRESSINGS) ×5 IMPLANT
BASKET HEART  (ORDER IN 25'S) (MISCELLANEOUS) ×1
BASKET HEART (ORDER IN 25'S) (MISCELLANEOUS) ×1
BASKET HEART (ORDER IN 25S) (MISCELLANEOUS) ×3 IMPLANT
BLADE STERNUM SYSTEM 6 (BLADE) ×5 IMPLANT
BNDG GAUZE ELAST 4 BULKY (GAUZE/BANDAGES/DRESSINGS) ×5 IMPLANT
CANISTER SUCTION 2500CC (MISCELLANEOUS) ×5 IMPLANT
CATH ROBINSON RED A/P 18FR (CATHETERS) ×10 IMPLANT
CATH THORACIC 28FR (CATHETERS) ×5 IMPLANT
CATH THORACIC 36FR (CATHETERS) ×5 IMPLANT
CATH THORACIC 36FR RT ANG (CATHETERS) ×5 IMPLANT
CLIP TI MEDIUM 24 (CLIP) IMPLANT
CLIP TI WIDE RED SMALL 24 (CLIP) ×5 IMPLANT
CRADLE DONUT ADULT HEAD (MISCELLANEOUS) ×5 IMPLANT
DERMABOND ADVANCED (GAUZE/BANDAGES/DRESSINGS) ×2
DERMABOND ADVANCED .7 DNX12 (GAUZE/BANDAGES/DRESSINGS) ×3 IMPLANT
DRAPE CARDIOVASCULAR INCISE (DRAPES) ×2
DRAPE SLUSH/WARMER DISC (DRAPES) ×5 IMPLANT
DRAPE SRG 135X102X78XABS (DRAPES) ×3 IMPLANT
DRSG COVADERM 4X14 (GAUZE/BANDAGES/DRESSINGS) ×5 IMPLANT
ELECT CAUTERY BLADE 6.4 (BLADE) ×5 IMPLANT
ELECT REM PT RETURN 9FT ADLT (ELECTROSURGICAL) ×10
ELECTRODE REM PT RTRN 9FT ADLT (ELECTROSURGICAL) ×6 IMPLANT
FELT TEFLON 1X6 (MISCELLANEOUS) ×10 IMPLANT
GAUZE SPONGE 4X4 12PLY STRL (GAUZE/BANDAGES/DRESSINGS) ×10 IMPLANT
GLOVE BIO SURGEON STRL SZ 6 (GLOVE) IMPLANT
GLOVE BIO SURGEON STRL SZ 6.5 (GLOVE) IMPLANT
GLOVE BIO SURGEON STRL SZ7 (GLOVE) IMPLANT
GLOVE BIO SURGEON STRL SZ7.5 (GLOVE) IMPLANT
GLOVE BIO SURGEONS STRL SZ 6.5 (GLOVE)
GLOVE BIOGEL M 6.5 STRL (GLOVE) ×15 IMPLANT
GLOVE BIOGEL M STER SZ 6 (GLOVE) ×5 IMPLANT
GLOVE BIOGEL M STRL SZ7.5 (GLOVE) ×15 IMPLANT
GLOVE BIOGEL PI IND STRL 6 (GLOVE) IMPLANT
GLOVE BIOGEL PI IND STRL 6.5 (GLOVE) ×6 IMPLANT
GLOVE BIOGEL PI IND STRL 7.0 (GLOVE) IMPLANT
GLOVE BIOGEL PI INDICATOR 6 (GLOVE)
GLOVE BIOGEL PI INDICATOR 6.5 (GLOVE) ×4
GLOVE BIOGEL PI INDICATOR 7.0 (GLOVE)
GLOVE EUDERMIC 7 POWDERFREE (GLOVE) ×10 IMPLANT
GLOVE ORTHO TXT STRL SZ7.5 (GLOVE) IMPLANT
GLOVE SURG SS PI 6.0 STRL IVOR (GLOVE) ×5 IMPLANT
GOWN STRL REUS W/ TWL LRG LVL3 (GOWN DISPOSABLE) ×36 IMPLANT
GOWN STRL REUS W/ TWL XL LVL3 (GOWN DISPOSABLE) ×3 IMPLANT
GOWN STRL REUS W/TWL LRG LVL3 (GOWN DISPOSABLE) ×24
GOWN STRL REUS W/TWL XL LVL3 (GOWN DISPOSABLE) ×2
HEMOSTAT POWDER SURGIFOAM 1G (HEMOSTASIS) ×15 IMPLANT
HEMOSTAT SURGICEL 2X14 (HEMOSTASIS) ×5 IMPLANT
INSERT FOGARTY 61MM (MISCELLANEOUS) IMPLANT
INSERT FOGARTY XLG (MISCELLANEOUS) IMPLANT
KIT BASIN OR (CUSTOM PROCEDURE TRAY) ×5 IMPLANT
KIT CATH CPB BARTLE (MISCELLANEOUS) ×5 IMPLANT
KIT ROOM TURNOVER OR (KITS) ×5 IMPLANT
KIT SUCTION CATH 14FR (SUCTIONS) ×5 IMPLANT
KIT VASOVIEW HEMOPRO VH 3000 (KITS) ×5 IMPLANT
NS IRRIG 1000ML POUR BTL (IV SOLUTION) ×25 IMPLANT
PACK OPEN HEART (CUSTOM PROCEDURE TRAY) ×5 IMPLANT
PAD ARMBOARD 7.5X6 YLW CONV (MISCELLANEOUS) ×10 IMPLANT
PAD ELECT DEFIB RADIOL ZOLL (MISCELLANEOUS) ×5 IMPLANT
PENCIL BUTTON HOLSTER BLD 10FT (ELECTRODE) ×5 IMPLANT
PUNCH AORTIC ROT 4.0MM RCL 40 (MISCELLANEOUS) ×5 IMPLANT
PUNCH AORTIC ROTATE 4.0MM (MISCELLANEOUS) IMPLANT
PUNCH AORTIC ROTATE 4.5MM 8IN (MISCELLANEOUS) ×5 IMPLANT
PUNCH AORTIC ROTATE 5MM 8IN (MISCELLANEOUS) IMPLANT
SET CARDIOPLEGIA MPS 5001102 (MISCELLANEOUS) ×5 IMPLANT
SPONGE GAUZE 4X4 12PLY STER LF (GAUZE/BANDAGES/DRESSINGS) ×10 IMPLANT
SPONGE INTESTINAL PEANUT (DISPOSABLE) IMPLANT
SPONGE LAP 18X18 X RAY DECT (DISPOSABLE) IMPLANT
SPONGE LAP 4X18 X RAY DECT (DISPOSABLE) ×5 IMPLANT
SUT BONE WAX W31G (SUTURE) ×5 IMPLANT
SUT MNCRL AB 4-0 PS2 18 (SUTURE) IMPLANT
SUT PROLENE 3 0 SH DA (SUTURE) IMPLANT
SUT PROLENE 3 0 SH1 36 (SUTURE) ×5 IMPLANT
SUT PROLENE 4 0 RB 1 (SUTURE)
SUT PROLENE 4 0 SH DA (SUTURE) IMPLANT
SUT PROLENE 4-0 RB1 .5 CRCL 36 (SUTURE) IMPLANT
SUT PROLENE 5 0 C 1 36 (SUTURE) IMPLANT
SUT PROLENE 6 0 C 1 30 (SUTURE) IMPLANT
SUT PROLENE 7 0 BV 1 (SUTURE) ×5 IMPLANT
SUT PROLENE 7 0 BV1 MDA (SUTURE) ×5 IMPLANT
SUT PROLENE 8 0 BV175 6 (SUTURE) ×5 IMPLANT
SUT SILK  1 MH (SUTURE)
SUT SILK 1 MH (SUTURE) IMPLANT
SUT STEEL STERNAL CCS#1 18IN (SUTURE) IMPLANT
SUT STEEL SZ 6 DBL 3X14 BALL (SUTURE) ×15 IMPLANT
SUT VIC AB 1 CTX 36 (SUTURE) ×4
SUT VIC AB 1 CTX36XBRD ANBCTR (SUTURE) ×6 IMPLANT
SUT VIC AB 2-0 CT1 27 (SUTURE) ×2
SUT VIC AB 2-0 CT1 TAPERPNT 27 (SUTURE) ×3 IMPLANT
SUT VIC AB 2-0 CTX 27 (SUTURE) IMPLANT
SUT VIC AB 3-0 SH 27 (SUTURE)
SUT VIC AB 3-0 SH 27X BRD (SUTURE) IMPLANT
SUT VIC AB 3-0 X1 27 (SUTURE) ×5 IMPLANT
SUT VICRYL 4-0 PS2 18IN ABS (SUTURE) IMPLANT
SUTURE E-PAK OPEN HEART (SUTURE) ×5 IMPLANT
SYSTEM SAHARA CHEST DRAIN ATS (WOUND CARE) ×5 IMPLANT
TAPE CLOTH SURG 4X10 WHT LF (GAUZE/BANDAGES/DRESSINGS) ×10 IMPLANT
TAPE PAPER 3X10 WHT MICROPORE (GAUZE/BANDAGES/DRESSINGS) ×5 IMPLANT
TOWEL OR 17X24 6PK STRL BLUE (TOWEL DISPOSABLE) ×5 IMPLANT
TOWEL OR 17X26 10 PK STRL BLUE (TOWEL DISPOSABLE) ×5 IMPLANT
TRAY FOLEY IC TEMP SENS 16FR (CATHETERS) ×5 IMPLANT
TUBING INSUFFLATION (TUBING) ×5 IMPLANT
UNDERPAD 30X30 (UNDERPADS AND DIAPERS) ×5 IMPLANT
WATER STERILE IRR 1000ML POUR (IV SOLUTION) ×10 IMPLANT

## 2016-05-08 NOTE — Brief Op Note (Signed)
05/03/2016 - 05/08/2016  5:33 PM  PATIENT:  Allen [name]  82 y.o. male  PRE-OPERATIVE DIAGNOSIS:  CAD  POST-OPERATIVE DIAGNOSIS:  CAD  PROCEDURE:  Procedure(s): CORONARY ARTERY BYPASS GRAFTING (CABG) (N/A) TRANSESOPHAGEAL ECHOCARDIOGRAM (TEE) (N/A) EVH RIGHT GREATER SAPHENOUS VEIN LIMA-LAD SEQ SVG-OM1-OM2 SVG-DIAG SVG-RCA  SURGEON:  Surgeon(s) and Role:    * Alleen BorneBryan K Bartle, MD - Primary  PHYSICIAN ASSISTANT: WAYNE GOLD PA-C; TESSA CONTE PA-C   ANESTHESIA:   general  EBL:  Total I/O In: 43.8 [I.V.:43.8] Out: 800 [Urine:800]  BLOOD ADMINISTERED:none  DRAINS: ROUTINE   LOCAL MEDICATIONS USED:  NONE  SPECIMEN:  No Specimen  DISPOSITION OF SPECIMEN:  N/A  COUNTS:  YES  TOURNIQUET:  * No tourniquets in log *  DICTATION: .Dragon Dictation  PLAN OF CARE: Admit to inpatient   PATIENT DISPOSITION:  ICU - intubated and hemodynamically stable.   Delay start of Pharmacological VTE agent (>24hrs) due to surgical blood loss or risk of bleeding: yes  COMPLICATIONS: NO KNOWN

## 2016-05-08 NOTE — Anesthesia Procedure Notes (Signed)
Procedures

## 2016-05-08 NOTE — Anesthesia Preprocedure Evaluation (Addendum)
Anesthesia Evaluation  Patient identified by MRN, date of birth, ID band  Reviewed: Allergy & Precautions, NPO status , Patient's Chart, lab work & pertinent test results  Airway Mallampati: II  TM Distance: >3 FB Neck ROM: Full    Dental  (+) Teeth Intact   Pulmonary shortness of breath, sleep apnea , former smoker,    breath sounds clear to auscultation       Cardiovascular hypertension, + CAD and + Past MI  + dysrhythmias  Rhythm:Regular Rate:Normal  Left ventricle: The cavity size was normal. There was mild   concentric hypertrophy. Systolic function was mildly to   moderately reduced. The estimated ejection fraction was in the   range of 40% to 45%. Diffuse hypokinesis. Doppler parameters are   consistent with abnormal left ventricular relaxation (grade 1   diastolic dysfunction). - Ventricular septum: Septal motion showed abnormal function and   dyssynergy. These changes are consistent with intraventricular   conduction delay. - Aortic valve: There was very mild stenosis. There was mild   regurgitation. Valve area (VTI): 1.96 cm^2. Valve area (Vmax):   1.84 cm^2. Valve area (Vmean): 1.99 cm^2. - Mitral valve: There was mild regurgitation. - Atrial septum: No defect or patent foramen ovale was identified.  -------------------------------------------------------------------   Neuro/Psych    GI/Hepatic   Endo/Other  diabetes  Renal/GU Renal InsufficiencyRenal disease     Musculoskeletal negative musculoskeletal ROS (+)   Abdominal (+) + obese,   Peds  Hematology negative hematology ROS (+)   Anesthesia Other Findings   Reproductive/Obstetrics                           Anesthesia Physical Anesthesia Plan  ASA: III  Anesthesia Plan: General   Post-op Pain Management:    Induction: Intravenous  Airway Management Planned: Oral ETT  Additional Equipment: Arterial line, TEE, PA  Cath and Ultrasound Guidance Line Placement  Intra-op Plan:   Post-operative Plan: Post-operative intubation/ventilation  Informed Consent: I have reviewed the patients History and Physical, chart, labs and discussed the procedure including the risks, benefits and alternatives for the proposed anesthesia with the patient or authorized representative who has indicated his/her understanding and acceptance.   Dental advisory given  Plan Discussed with:   Anesthesia Plan Comments:         Anesthesia Quick Evaluation

## 2016-05-08 NOTE — Transfer of Care (Signed)
Immediate Anesthesia Transfer of Care Note  Patient: Allen [name]  Procedure(s) Performed: Procedure(s): CORONARY ARTERY BYPASS GRAFTING (CABG) x 5 (N/A) TRANSESOPHAGEAL ECHOCARDIOGRAM (TEE) (N/A) ENDOVEIN HARVEST OF GREATER SAPHENOUS VEIN  Patient Location: SICU  Anesthesia Type:General  Level of Consciousness: Patient remains intubated per anesthesia plan  Airway & Oxygen Therapy: Patient remains intubated per anesthesia plan and Patient placed on Ventilator (see vital sign flow sheet for setting)  Post-op Assessment: Report given to RN and Post -op Vital signs reviewed and stable  Post vital signs: Reviewed and stable  Last Vitals:  Vitals:   05/08/16 1226 05/08/16 1940  BP: (!) 125/109   Pulse:  98  Resp: (!) 21 16  Temp:      Last Pain:  Vitals:   05/08/16 1200  TempSrc: Oral  PainSc: 0-No pain         Complications: No apparent anesthesia complications

## 2016-05-08 NOTE — Care Management Important Message (Signed)
Important Message  Patient Details  Name: Allen PuttSamuel J Calkin MRN: [id number] Date of Birth: 10/12/1933   Medicare Important Message Given:  Yes    Kyla BalzarineShealy, Naevia Unterreiner Abena 05/08/2016, 11:54 AM

## 2016-05-08 NOTE — Anesthesia Procedure Notes (Signed)
Procedure Name: Intubation Date/Time: 05/08/2016 2:17 PM Performed by: Little IshikawaMERCER, [name] Pre-anesthesia Checklist: Patient identified, Emergency Drugs available, Suction available and Patient being monitored Patient Re-evaluated:Patient Re-evaluated prior to inductionOxygen Delivery Method: Circle System Utilized Preoxygenation: Pre-oxygenation with 100% oxygen Intubation Type: IV induction Ventilation: Mask ventilation without difficulty and Oral airway inserted - appropriate to patient size Laryngoscope Size: Mac and 4 Grade View: Grade II Tube type: Subglottic suction tube Tube size: 8.0 mm Number of attempts: 1 Airway Equipment and Method: Stylet and Oral airway Placement Confirmation: ETT inserted through vocal cords under direct vision,  positive ETCO2 and breath sounds checked- equal and bilateral Secured at: 22 cm Tube secured with: Tape Dental Injury: Teeth and Oropharynx as per pre-operative assessment

## 2016-05-08 NOTE — Progress Notes (Signed)
  Echocardiogram Echocardiogram Transesophageal has been performed.  Cathie BeamsGREGORY, Khye Hochstetler 05/08/2016, 2:41 PM

## 2016-05-08 NOTE — Progress Notes (Signed)
Pre-op Cardiac Surgery  Carotid Findings:  Bilateral: No significant (1-39%) ICA stenosis. Antegrade vertebral flow.    Upper Extremity Right Left  Brachial Pressures N/A IV site 121  Radial Waveforms Tri Tri  Ulnar Waveforms Tri Tri  Palmar Arch (Allen's Test) Decreases less than 50% with radial compression, normal with ulnar compresison Normal      Lower  Extremity Right Left  Dorsalis Pedis 90, mono 124, bi  Anterior Tibial    Posterior Tibial 124, bi  96, mono  Ankle/Brachial Indices >1   WNL >1   WNL    Farrel DemarkJill Eunice, RDMS, RVT 05/08/2016

## 2016-05-08 NOTE — Progress Notes (Signed)
SUBJECTIVE: No chest pain. No dyspnea  Tele: sinus brady  BP 136/73 (BP Location: Left Arm)   Pulse (!) 45   Temp 97.5 F (36.4 C) (Oral)   Resp 13   Ht 5\' 2"  (1.575 m)   Wt 179 lb 14.4 oz (81.6 kg)   SpO2 97%   BMI 32.90 kg/m   Intake/Output Summary (Last 24 hours) at 05/08/16 0748 Last data filed at 05/08/16 0600  Gross per 24 hour  Intake              641 ml  Output             1075 ml  Net             -434 ml    PHYSICAL EXAM General: Well developed, well nourished, in no acute distress. Alert and oriented x 3.  Psych:  Good affect, responds appropriately Neck: No JVD. No masses noted.  Lungs: Clear bilaterally with no wheezes or rhonci noted.  Heart: Regular brady with no murmurs noted. Abdomen: Bowel sounds are present. Soft, non-tender.  Extremities: No lower extremity edema.   LABS: Basic Metabolic Panel:  Recent Labs  16/03/9610/25/17 0231 05/07/16 1715 05/08/16 0516  NA 142 139 138  K 4.0 4.6 4.9  CL 110 106 106  CO2 25 25 24   GLUCOSE 39* 129* 97  BUN 12 15 15   CREATININE 1.45* 1.57* 1.44*  CALCIUM 10.5* 11.0* 10.8*  MG 1.9  --   --    CBC:  Recent Labs  05/07/16 0247 05/08/16 0516  WBC 5.0 4.8  HGB 11.7* 13.1  HCT 37.6* 41.1  MCV 90.2 89.7  PLT 205 186   Current Meds: . amLODipine  5 mg Oral Daily  . aspirin EC  81 mg Oral Daily  . atorvastatin  80 mg Oral q1800  . bisacodyl  5 mg Oral Once  . cefUROXime (ZINACEF)  IV  1.5 g Intravenous To OR  . cefUROXime (ZINACEF)  IV  750 mg Intravenous To OR  . chlorhexidine  15 mL Mouth/Throat Once  . Chlorhexidine Gluconate Cloth  6 each Topical Once  . dexmedetomidine  0.1-0.7 mcg/kg/hr Intravenous To OR  . diazepam  5 mg Oral Once  . DOPamine  0-10 mcg/kg/min Intravenous To OR  . epinephrine  0-10 mcg/min Intravenous To OR  . heparin-papaverine-plasmalyte irrigation   Irrigation To OR  . heparin 30,000 units/NS 1000 mL solution for CELLSAVER   Other To OR  . insulin aspart  0-15 Units  Subcutaneous TID WC  . insulin glargine  25 Units Subcutaneous QHS  . insulin (NOVOLIN-R) infusion   Intravenous To OR  . isosorbide mononitrate  60 mg Oral Daily  . lisinopril  20 mg Oral Daily  . magnesium sulfate  40 mEq Other To OR  . metoprolol tartrate  12.5 mg Oral Once  . nitroGLYCERIN  2-200 mcg/min Intravenous To OR  . pantoprazole  20 mg Oral Daily  . phenylephrine (NEO-SYNEPHRINE) Adult infusion  30-200 mcg/min Intravenous To OR  . [START ON 05/10/2016] pneumococcal 23 valent vaccine  0.5 mL Intramuscular Tomorrow-1000  . potassium chloride  80 mEq Other To OR  . sodium chloride flush  3 mL Intravenous Q12H  . tranexamic acid (CYKLOKAPRON) infusion ([facility])  1.5 mg/kg/hr Intravenous To OR  . tranexamic acid  15 mg/kg Intravenous To OR  . tranexamic acid  2 mg/kg Intracatheter To OR  . vancomycin  1,250 mg Intravenous To OR  ASSESSMENT AND PLAN:  1. CAD with unstable angina/NSTEMI:  Severe 3 vessel CAD by cath 05/03/16 with left main disease. Plans for CABG today. He is stable today. Continue ASA, statin, Ace-inh, Imdur.   2. Ischemic cardiomyopathy: LVEF=40-45% by echo.   3. HTN: BP stable.   4. Bradycardia: beta blocker on hold. May require pacing post op.  Verne Carrowhristopher Jaeda Bruso  11/27/20177:48 AM

## 2016-05-08 NOTE — Progress Notes (Signed)
ANTICOAGULATION CONSULT NOTE - Follow Up Consult  Pharmacy Consult for heparin Indication: chest pain/ACS  Allergies  Allergen Reactions  . No Known Allergies     Patient Measurements: Height: 5\' 2"  (157.5 cm) Weight: 179 lb 14.4 oz (81.6 kg) IBW/kg (Calculated) : 54.6 Heparin Dosing Weight: 71 kg  Vital Signs: Temp: 97.5 F (36.4 C) (11/27 0339) Temp Source: Oral (11/27 0339) BP: 136/73 (11/27 0339) Pulse Rate: 45 (11/27 0339)  Labs:  Recent Labs  05/06/16 0231  05/06/16 2204 05/07/16 0247 05/07/16 1715 05/08/16 0516  HGB 10.0*  --   --  11.7*  --  13.1  HCT 34.0*  --   --  37.6*  --  41.1  PLT 197  --   --  205  --  186  APTT  --   --   --   --   --  52*  LABPROT  --   --   --   --  13.0  --   INR  --   --   --   --  0.98  --   HEPARINUNFRC 0.86*  < > 0.57 0.50  --  0.34  CREATININE 1.45*  --   --   --  1.57* 1.44*  < > = values in this interval not displayed.  Estimated Creatinine Clearance: 37.8 mL/min (by C-G formula based on SCr of 1.44 mg/dL (H)).  Assessment: 82 yo M admitted 05/03/2016 with NSTEMI and planned CABG on 11/27. Pharmacy consulted to dose heparin.  Heparin level 0.3 (therapeutic), Hgb 13 stable,  Plt wnl. No s/sx of bleeding noted.  Goal of Therapy:  Heparin level 0.3-0.7 units/ml Monitor platelets by anticoagulation protocol: Yes   Plan:  - Continue heparin 700 units/hr  - For CABG this afternoon  Sheppard CoilFrank Alexzavier Girardin PharmD., BCPS Clinical Pharmacist Pager [phone] 05/08/2016 7:53 AM

## 2016-05-09 ENCOUNTER — Encounter (HOSPITAL_COMMUNITY): Payer: Self-pay | Admitting: Surgery

## 2016-05-09 ENCOUNTER — Inpatient Hospital Stay (HOSPITAL_COMMUNITY): Payer: Medicare PPO

## 2016-05-09 LAB — GLUCOSE, CAPILLARY
GLUCOSE-CAPILLARY: 174 mg/dL — AB (ref 65–99)
GLUCOSE-CAPILLARY: 222 mg/dL — AB (ref 65–99)
GLUCOSE-CAPILLARY: 162 mg/dL — AB (ref 65–99)
GLUCOSE-CAPILLARY: 128 mg/dL — AB (ref 65–99)
GLUCOSE-CAPILLARY: 117 mg/dL — AB (ref 65–99)
GLUCOSE-CAPILLARY: 76 mg/dL (ref 65–99)
GLUCOSE-CAPILLARY: 101 mg/dL — AB (ref 65–99)
Glucose-Capillary: 108 mg/dL — ABNORMAL HIGH (ref 65–99)
Glucose-Capillary: 115 mg/dL — ABNORMAL HIGH (ref 65–99)
Glucose-Capillary: 118 mg/dL — ABNORMAL HIGH (ref 65–99)
Glucose-Capillary: 133 mg/dL — ABNORMAL HIGH (ref 65–99)
Glucose-Capillary: 199 mg/dL — ABNORMAL HIGH (ref 65–99)
Glucose-Capillary: 247 mg/dL — ABNORMAL HIGH (ref 65–99)
Glucose-Capillary: 81 mg/dL (ref 65–99)
Glucose-Capillary: 94 mg/dL (ref 65–99)

## 2016-05-09 LAB — POCT I-STAT 3, ART BLOOD GAS (G3+)
ACID-BASE EXCESS: 2 mmol/L (ref 0.0–2.0)
ACID-BASE EXCESS: 4 mmol/L — AB (ref 0.0–2.0)
Acid-base deficit: 2 mmol/L (ref 0.0–2.0)
Acid-base deficit: 3 mmol/L — ABNORMAL HIGH (ref 0.0–2.0)
BICARBONATE: 26.6 mmol/L (ref 20.0–28.0)
BICARBONATE: 28.9 mmol/L — AB (ref 20.0–28.0)
BICARBONATE: 23.3 mmol/L (ref 20.0–28.0)
Bicarbonate: 22.4 mmol/L (ref 20.0–28.0)
O2 SAT: 100 %
O2 SAT: 96 %
O2 SAT: 94 %
O2 Saturation: 100 %
PCO2 ART: 42.4 mmHg (ref 32.0–48.0)
PCO2 ART: 39.3 mmHg (ref 32.0–48.0)
PH ART: 7.428 (ref 7.350–7.450)
PO2 ART: 409 mmHg — AB (ref 83.0–108.0)
PO2 ART: 394 mmHg — AB (ref 83.0–108.0)
PO2 ART: 83 mmHg (ref 83.0–108.0)
PO2 ART: 73 mmHg — AB (ref 83.0–108.0)
Patient temperature: 36.6
Patient temperature: 36.6
TCO2: 28 mmol/L (ref 0–100)
TCO2: 30 mmol/L (ref 0–100)
TCO2: 25 mmol/L (ref 0–100)
TCO2: 24 mmol/L (ref 0–100)
pCO2 arterial: 40.3 mmHg (ref 32.0–48.0)
pCO2 arterial: 44.8 mmHg (ref 32.0–48.0)
pH, Arterial: 7.418 (ref 7.350–7.450)
pH, Arterial: 7.346 — ABNORMAL LOW (ref 7.350–7.450)
pH, Arterial: 7.363 (ref 7.350–7.450)

## 2016-05-09 LAB — POCT I-STAT, CHEM 8
BUN: 16 mg/dL (ref 6–20)
BUN: 16 mg/dL (ref 6–20)
BUN: 13 mg/dL (ref 6–20)
BUN: 16 mg/dL (ref 6–20)
BUN: 16 mg/dL (ref 6–20)
BUN: 21 mg/dL — AB (ref 6–20)
CALCIUM ION: 1.12 mmol/L — AB (ref 1.15–1.40)
CALCIUM ION: 1.29 mmol/L (ref 1.15–1.40)
CALCIUM ION: 1.44 mmol/L — AB (ref 1.15–1.40)
CALCIUM ION: 1.4 mmol/L (ref 1.15–1.40)
CHLORIDE: 105 mmol/L (ref 101–111)
CHLORIDE: 102 mmol/L (ref 101–111)
CHLORIDE: 101 mmol/L (ref 101–111)
CREATININE: 1.2 mg/dL (ref 0.61–1.24)
CREATININE: 1.1 mg/dL (ref 0.61–1.24)
CREATININE: 1 mg/dL (ref 0.61–1.24)
CREATININE: 1.2 mg/dL (ref 0.61–1.24)
CREATININE: 1.5 mg/dL — AB (ref 0.61–1.24)
Calcium, Ion: 1.42 mmol/L — ABNORMAL HIGH (ref 1.15–1.40)
Calcium, Ion: 1.4 mmol/L (ref 1.15–1.40)
Chloride: 104 mmol/L (ref 101–111)
Chloride: 101 mmol/L (ref 101–111)
Chloride: 103 mmol/L (ref 101–111)
Creatinine, Ser: 1.2 mg/dL (ref 0.61–1.24)
GLUCOSE: 96 mg/dL (ref 65–99)
GLUCOSE: 90 mg/dL (ref 65–99)
GLUCOSE: 77 mg/dL (ref 65–99)
GLUCOSE: 111 mg/dL — AB (ref 65–99)
GLUCOSE: 145 mg/dL — AB (ref 65–99)
Glucose, Bld: 134 mg/dL — ABNORMAL HIGH (ref 65–99)
HCT: 29 % — ABNORMAL LOW (ref 39.0–52.0)
HCT: 26 % — ABNORMAL LOW (ref 39.0–52.0)
HCT: 29 % — ABNORMAL LOW (ref 39.0–52.0)
HCT: 29 % — ABNORMAL LOW (ref 39.0–52.0)
HEMATOCRIT: 37 % — AB (ref 39.0–52.0)
HEMATOCRIT: 31 % — AB (ref 39.0–52.0)
HEMOGLOBIN: 12.6 g/dL — AB (ref 13.0–17.0)
HEMOGLOBIN: 9.9 g/dL — AB (ref 13.0–17.0)
HEMOGLOBIN: 8.8 g/dL — AB (ref 13.0–17.0)
Hemoglobin: 10.5 g/dL — ABNORMAL LOW (ref 13.0–17.0)
Hemoglobin: 9.9 g/dL — ABNORMAL LOW (ref 13.0–17.0)
Hemoglobin: 9.9 g/dL — ABNORMAL LOW (ref 13.0–17.0)
POTASSIUM: 5.2 mmol/L — AB (ref 3.5–5.1)
POTASSIUM: 5.4 mmol/L — AB (ref 3.5–5.1)
POTASSIUM: 7.3 mmol/L — AB (ref 3.5–5.1)
Potassium: 5.6 mmol/L — ABNORMAL HIGH (ref 3.5–5.1)
Potassium: 6.5 mmol/L (ref 3.5–5.1)
Potassium: 4.9 mmol/L (ref 3.5–5.1)
SODIUM: 133 mmol/L — AB (ref 135–145)
SODIUM: 134 mmol/L — AB (ref 135–145)
Sodium: 139 mmol/L (ref 135–145)
Sodium: 137 mmol/L (ref 135–145)
Sodium: 137 mmol/L (ref 135–145)
Sodium: 133 mmol/L — ABNORMAL LOW (ref 135–145)
TCO2: 29 mmol/L (ref 0–100)
TCO2: 27 mmol/L (ref 0–100)
TCO2: 27 mmol/L (ref 0–100)
TCO2: 28 mmol/L (ref 0–100)
TCO2: 25 mmol/L (ref 0–100)
TCO2: 23 mmol/L (ref 0–100)

## 2016-05-09 LAB — CBC
HCT: 31.7 % — ABNORMAL LOW (ref 39.0–52.0)
HEMATOCRIT: 28.6 % — AB (ref 39.0–52.0)
HEMOGLOBIN: 9.1 g/dL — AB (ref 13.0–17.0)
Hemoglobin: 10.1 g/dL — ABNORMAL LOW (ref 13.0–17.0)
MCH: 28.5 pg (ref 26.0–34.0)
MCH: 28.2 pg (ref 26.0–34.0)
MCHC: 31.9 g/dL (ref 30.0–36.0)
MCHC: 31.8 g/dL (ref 30.0–36.0)
MCV: 89.3 fL (ref 78.0–100.0)
MCV: 88.5 fL (ref 78.0–100.0)
Platelets: 141 10*3/uL — ABNORMAL LOW (ref 150–400)
Platelets: 135 10*3/uL — ABNORMAL LOW (ref 150–400)
RBC: 3.55 MIL/uL — ABNORMAL LOW (ref 4.22–5.81)
RBC: 3.23 MIL/uL — AB (ref 4.22–5.81)
RDW: 15.1 % (ref 11.5–15.5)
RDW: 15.1 % (ref 11.5–15.5)
WBC: 8.4 10*3/uL (ref 4.0–10.5)
WBC: 9.4 10*3/uL (ref 4.0–10.5)

## 2016-05-09 LAB — MAGNESIUM
MAGNESIUM: 2.6 mg/dL — AB (ref 1.7–2.4)
Magnesium: 3.1 mg/dL — ABNORMAL HIGH (ref 1.7–2.4)

## 2016-05-09 LAB — BASIC METABOLIC PANEL
Anion gap: 8 (ref 5–15)
BUN: 17 mg/dL (ref 6–20)
CALCIUM: 10 mg/dL (ref 8.9–10.3)
CHLORIDE: 103 mmol/L (ref 101–111)
CO2: 23 mmol/L (ref 22–32)
CREATININE: 1.49 mg/dL — AB (ref 0.61–1.24)
GFR calc non Af Amer: 43 mL/min — ABNORMAL LOW (ref >60)
GFR, EST AFRICAN AMERICAN: 49 mL/min — AB (ref >60)
Glucose, Bld: 206 mg/dL — ABNORMAL HIGH (ref 65–99)
Potassium: 4.5 mmol/L (ref 3.5–5.1)
SODIUM: 134 mmol/L — AB (ref 135–145)

## 2016-05-09 LAB — CREATININE, SERUM
CREATININE: 1.42 mg/dL — AB (ref 0.61–1.24)
GFR, EST AFRICAN AMERICAN: 52 mL/min — AB (ref >60)
GFR, EST NON AFRICAN AMERICAN: 45 mL/min — AB (ref >60)

## 2016-05-09 MED ORDER — ORAL CARE MOUTH RINSE
15.0000 mL | Freq: Two times a day (BID) | OROMUCOSAL | Status: DC
  Administered 2016-05-09 – 2016-05-11 (×5): 15 mL via OROMUCOSAL

## 2016-05-09 MED ORDER — INSULIN DETEMIR 100 UNIT/ML ~~LOC~~ SOLN
20.0000 [IU] | Freq: Every day | SUBCUTANEOUS | Status: DC
  Administered 2016-05-09: 20 [IU] via SUBCUTANEOUS
  Filled 2016-05-09 (×4): qty 0.2

## 2016-05-09 MED ORDER — INSULIN DETEMIR 100 UNIT/ML ~~LOC~~ SOLN
20.0000 [IU] | Freq: Every day | SUBCUTANEOUS | Status: DC
  Administered 2016-05-10 – 2016-05-16 (×6): 20 [IU] via SUBCUTANEOUS
  Filled 2016-05-09 (×7): qty 0.2

## 2016-05-09 MED ORDER — ENOXAPARIN SODIUM 40 MG/0.4ML ~~LOC~~ SOLN
40.0000 mg | Freq: Every day | SUBCUTANEOUS | Status: DC
  Administered 2016-05-09 – 2016-05-11 (×3): 40 mg via SUBCUTANEOUS
  Filled 2016-05-09 (×3): qty 0.4

## 2016-05-09 MED ORDER — INSULIN ASPART 100 UNIT/ML ~~LOC~~ SOLN
0.0000 [IU] | SUBCUTANEOUS | Status: DC
  Administered 2016-05-09 – 2016-05-11 (×10): 2 [IU] via SUBCUTANEOUS

## 2016-05-09 MED FILL — Lidocaine HCl IV Inj 20 MG/ML: INTRAVENOUS | Qty: 5 | Status: AC

## 2016-05-09 MED FILL — Sodium Bicarbonate IV Soln 8.4%: INTRAVENOUS | Qty: 50 | Status: AC

## 2016-05-09 MED FILL — Mannitol IV Soln 20%: INTRAVENOUS | Qty: 500 | Status: AC

## 2016-05-09 MED FILL — Sodium Chloride IV Soln 0.9%: INTRAVENOUS | Qty: 2000 | Status: AC

## 2016-05-09 MED FILL — Electrolyte-R (PH 7.4) Solution: INTRAVENOUS | Qty: 4000 | Status: AC

## 2016-05-09 MED FILL — Calcium Chloride Inj 10%: INTRAVENOUS | Qty: 10 | Status: AC

## 2016-05-09 MED FILL — Heparin Sodium (Porcine) Inj 1000 Unit/ML: INTRAMUSCULAR | Qty: 20 | Status: AC

## 2016-05-09 NOTE — Progress Notes (Signed)
Rapid wean protocol started at this time 

## 2016-05-09 NOTE — Op Note (Signed)
CARDIOVASCULAR SURGERY OPERATIVE NOTE  05/09/2016  Surgeon:  Alleen Borne, MD  First Assistant: Gershon Crane,  PA-C and Jari Favre, [place]   Preoperative Diagnosis:  Severe multi-vessel coronary artery disease   Postoperative Diagnosis:  Same   Procedure:  1. Median Sternotomy 2. Extracorporeal circulation 3.   Coronary artery bypass grafting x 5   Left internal mammary graft to the LAD  SVG to diagonal  SVG to RCA  Sequential SVG to OM1 and OM2 4.   Endoscopic vein harvest from the right leg   Anesthesia:  General Endotracheal   Clinical History/Surgical Indication:  The patient is an 82 year old gentleman with a history of hypertension, hyperlipidemia, and multivessel coronary artery disease by cath in 10/2014 treated medically. His LVEF at that time was 35%. He reports a 6-7 month history of substernal burning chest discomfort that has occurred with exertion and at rest. He was recently hospitalized at [facility] and a stress test showed inferoapical ischemia with an EF of 32%. He saw Dr. Diona Browner a few days ago and his medical therapy was adjusted. Then he went to [employer] to buy some ice cream and developed severe pain that would not go away and he went to [facility]. He had some ST changes even with LBBB and troponin was positive. He was having intermittent runs of VT with severe CP and was transferred to [facility]. He was started on amiodarone prior to transfer. Since arrival here last pm he has had no CP. Cath shows high grade left main equivalent with a 70% ostial LAD, 95% ostial dominant LCX with 70% OM1 and occlusion of OM2, 80% and 70% proximal RCA lesions in a small vessel. LVEDP was 22 and LVgram was not done.  This 82 year old gentleman has severe 3-vessel coronary disease with unstable angina and NSTEMI. His echo in January 2017 showed mild calcific aortic stenosis with a mean AV  gradient of 11 mm Hg and an EF of 45-50%. A repeat echo now shows no significant change in his mild AS with a mean gradient of 11 mm Hg. His EF is 40-45%.  I agree that CABG is the best treatment for this active gentleman.  I discussed the operative procedure with the patient  including alternatives, benefits and risks; including but not limited to bleeding, blood transfusion, infection, stroke, myocardial infarction, graft failure, heart block requiring a permanent pacemaker, organ dysfunction, and death.  [name] understands and agrees to proceed.    Preparation:  The patient was seen in the preoperative holding area and the correct patient, correct operation were confirmed with the patient after reviewing the medical record and catheterization. The consent was signed by me. Preoperative antibiotics were given. A pulmonary arterial line and radial arterial line were placed by the anesthesia team. The patient was taken back to the operating room and positioned supine on the operating room table. After being placed under general endotracheal anesthesia by the anesthesia team a foley catheter was placed. The neck, chest, abdomen, and both legs were prepped with betadine soap and solution and draped in the usual sterile manner. A surgical time-out was taken and the correct patient and operative procedure were confirmed with the nursing and anesthesia staff.  JEX STRAUSBAUGH  ECHO TEE (OR)  Order# [id number]  Reading physician: Sharee Holster, MD Ordering physician: Alleen Borne, MD Study date: 05/08/16  Conclusions   Result status: Final result   Left ventricle: Cavity is mildly dilated. LV systolic  function is moderately to severely reduced with an EF of 30-35%. Grade II (moderate, pseudorelaxation) left ventricular diastolic dysfunction. Wall motion is abnormal. Hypokinetic anterior and septal , akinetic portion of inferior wall Mid, anteroseptal wall motion is hypokinetic. Mid, inferior wall  motion is hypokinetic. Apical septal wall motion. No thrombus present.  Aortic valve: Mildly decreased leaflet separation. Mild regurgitation. No AV vegetation.  Mitral valve: No leaflet thickening and calcification present. Trace regurgitation.  Right ventricle: Normal cavity size, wall thickness and ejection fraction.      Cardiopulmonary Bypass:  A median sternotomy was performed. The pericardium was opened in the midline. Right ventricular function appeared normal. The ascending aorta was of normal size and had no palpable plaque. There were no contraindications to aortic cannulation or cross-clamping. The patient was fully systemically heparinized and the ACT was maintained > 400 sec. The proximal aortic arch was cannulated with a 20 F aortic cannula for arterial inflow. Venous cannulation was performed via the right atrial appendage using a two-staged venous cannula. An antegrade cardioplegia/vent cannula was inserted into the mid-ascending aorta. Aortic occlusion was performed with a single cross-clamp. Systemic cooling to 32 degrees Centigrade and topical cooling of the heart with iced saline were used. Hyperkalemic antegrade cold blood cardioplegia was used to induce diastolic arrest and was then given at about 20 minute intervals throughout the period of arrest to maintain myocardial temperature at or below 10 degrees centigrade. A temperature probe was inserted into the interventricular septum and an insulating pad was placed in the pericardium.   Left internal mammary harvest:  The left side of the sternum was retracted using the Rultract retractor. The left internal mammary artery was harvested as a pedicle graft. All side branches were clipped. It was a medium-sized vessel of good quality with excellent blood flow. It was ligated distally and divided. It was sprayed with topical papaverine solution to prevent vasospasm.   Endoscopic vein harvest:  The right greater saphenous vein  was harvested endoscopically through a 2 cm incision medial to the right knee. It was harvested from the upper thigh to below the knee. It was a small-sized vein of good quality. The side branches were all ligated with 4-0 silk ties.    Coronary arteries:  The coronary arteries were examined.   LAD:  Large vessel with diffuse proximal and mid-vessel disease. The distal vessel was smaller but free of disease and graftable. The diagonal was a moderate sized vessel  LCX:  OM 1 was intramyocardial and large. Located in its mid portion where there was no disease. OM2 was also large with mild distal disease.  RCA:  Large vessel visible in the mid portion with no disease there.   Grafts:  1. LIMA to the LAD: 1.175mm. It was sewn end to side using 8-0 prolene continuous suture. 2. SVG to diagonal:  1.6 mm. It was sewn end to side using 7-0 prolene continuous suture. 3. Sequential SVG to OM1:  2.0 mm. It was sewn sequential side to side using 7-0 prolene continuous suture. 4. Sequential SVG to OM2: 1.75 mm. It was sewn sequential end to side using 7-0 prolene continuous suture.  The proximal vein graft anastomoses were performed to the mid-ascending aorta using continuous 6-0 prolene suture. Graft markers were placed around the proximal anastomoses.   Completion:  The patient was rewarmed to 37 degrees Centigrade. The clamp was removed from the LIMA pedicle and there was rapid warming of the septum and return of ventricular fibrillation. The  crossclamp was removed with a time of 96 minutes. There was spontaneous return of sinus rhythm. The distal and proximal anastomoses were checked for hemostasis. The position of the grafts was satisfactory. Two temporary epicardial pacing wires were placed on the right atrium and two on the right ventricle. The patient was weaned from CPB without difficulty on no inotropes. CPB time was 111 minutes. Cardiac output was 5 LPM. Heparin was fully reversed with  protamine and the aortic and venous cannulas removed. Hemostasis was achieved. Mediastinal and left pleural drainage tubes were placed. The sternum was closed with double #6 stainless steel wires. The fascia was closed with continuous # 1 vicryl suture. The subcutaneous tissue was closed with 2-0 vicryl continuous suture. The skin was closed with 3-0 vicryl subcuticular suture. All sponge, needle, and instrument counts were reported correct at the end of the case. Dry sterile dressings were placed over the incisions and around the chest tubes which were connected to pleurevac suction. The patient was then transported to the surgical intensive care unit in critical but stable condition.

## 2016-05-09 NOTE — Procedures (Signed)
Extubation Procedure Note  Patient Details:   Name: Allen PuttSamuel J Terra DOB: 07/31/1933 MRN: [id number]   Airway Documentation:     Evaluation  O2 sats: stable throughout Complications: No apparent complications Patient did tolerate procedure well. Bilateral Breath Sounds: Clear, Diminished   Yes, pt able to hoarsely vocalize name and cough to clear secretions. RT placed pt on 4L nasal cannula and pt tolerating well at this time. Pt had Nif of -27 and VC of 6L, pt also positive for cuff leak before extubation.  Tacy Learnurriff, Bearl Talarico E 05/09/2016, 2:38 AM

## 2016-05-09 NOTE — Progress Notes (Signed)
1 Day Post-Op Procedure(s) (LRB): CORONARY ARTERY BYPASS GRAFTING (CABG) x 5 (N/A) TRANSESOPHAGEAL ECHOCARDIOGRAM (TEE) (N/A) ENDOVEIN HARVEST OF GREATER SAPHENOUS VEIN Subjective: No complaints  Objective: Vital signs in last 24 hours: Temp:  [95.9 F (35.5 C)-98.8 F (37.1 C)] 98.6 F (37 C) (11/28 0700) Pulse Rate:  [44-98] 44 (11/28 0700) Cardiac Rhythm: A-V Sequential paced (11/27 2000) Resp:  [11-30] 30 (11/28 0700) BP: (87-134)/(48-109) 92/48 (11/28 0700) SpO2:  [98 %-100 %] 100 % (11/28 0700) Arterial Line BP: (88-144)/(44-85) 94/45 (11/28 0700) FiO2 (%):  [40 %-50 %] 40 % (11/28 0147) Weight:  [82.1 kg (181 lb)] 82.1 kg (181 lb) (11/28 0600)  Hemodynamic parameters for last 24 hours: PAP: (21-37)/(9-28) 24/18 CO:  [2.6 L/min-5.2 L/min] 5.2 L/min CI:  [1.4 L/min/m2-2.8 L/min/m2] 2.8 L/min/m2  Intake/Output from previous day: 11/27 0701 - 11/28 0700 In: 4854.4 [I.V.:3491.4; Blood:153; NG/GT:60; IV Piggyback:1150] Out: 5630 [Urine:4840; Emesis/NG output:150; Blood:300; Chest Tube:340] Intake/Output this shift: No intake/output data recorded.  General appearance: cooperative and slowed mentation Neurologic: intact Heart: regular rate and rhythm, S1, S2 normal, no murmur, click, rub or gallop Lungs: clear to auscultation bilaterally Extremities: edema mild Wound: dressings dry  Lab Results:  Recent Labs  05/08/16 2000 05/08/16 2004 05/09/16 0400  WBC 10.3  --  8.4  HGB 11.0* 11.2* 10.1*  HCT 34.2* 33.0* 31.7*  PLT 140*  --  141*   BMET:  Recent Labs  05/08/16 0516 05/08/16 2004 05/09/16 0400  NA 138 136 134*  K 4.9 6.1* 4.5  CL 106  --  103  CO2 24  --  23  GLUCOSE 97 152* 206*  BUN 15  --  17  CREATININE 1.44*  --  1.49*  CALCIUM 10.8*  --  10.0    PT/INR:  Recent Labs  05/08/16 2000  LABPROT 15.4*  INR 1.21   ABG    Component Value Date/Time   PHART 7.363 05/09/2016 0305   HCO3 22.4 05/09/2016 0305   TCO2 24 05/09/2016 0305   ACIDBASEDEF 3.0 (H) 05/09/2016 0305   O2SAT 94.0 05/09/2016 0305   CBG (last 3)   Recent Labs  05/09/16 0403 05/09/16 0502 05/09/16 0605  GLUCAP 199* 162* 128*   CLINICAL DATA:  Chest tube.  EXAM: PORTABLE CHEST 1 VIEW  COMPARISON:  05/08/2016.  FINDINGS: Interim extubation removal of NG tube. Left chest tube, Swan-Ganz catheter, mediastinal drainage catheter stable position . Prior CABG. Cardiomegaly. Low lung volumes with consolidation of the left lower lobe consistent with left lower lobe atelectasis and/or pneumonia. Small left pleural effusion cannot be excluded. No pneumothorax .  IMPRESSION: 1. Interim extubation and removal of NG tube. Remaining lines and tubes including left chest tube in stable position. No pneumothorax.  2. Interim consolidation of the left lower lobe consistent with atelectasis and/or infiltrate. Small left pleural effusion cannot be excluded.   Electronically Signed   By: Maisie Fushomas  Register   On: 05/09/2016 07:46  Assessment/Plan: S/P Procedure(s) (LRB): CORONARY ARTERY BYPASS GRAFTING (CABG) x 5 (N/A) TRANSESOPHAGEAL ECHOCARDIOGRAM (TEE) (N/A) ENDOVEIN HARVEST OF GREATER SAPHENOUS VEIN  He is hemodynamically stable in sinus rhythm. EF in the OR by TEE was 30-35%. Preop echo was read as 40-45%. CI good. On no inotropes. Will check Co-ox in am.  Mobilize Diuresis Diabetes control: preop Hgb A1c was 5.9. Start Levemir and SSI. d/c tubes/lines Continue foley due to patient in ICU and urinary output monitoring See progression orders   LOS: 6 days    Payton DoughtyBryan K  Demaria Deeney 05/09/2016

## 2016-05-09 NOTE — Progress Notes (Signed)
CT surgery p.m. Rounds  Status post CABG 5 yesterday Patient up out of bed today without difficulty Excellent diuresis P.m. labs reviewed and are satisfactory-creatinine remains mildly elevated, hemoglobin 9.5

## 2016-05-09 NOTE — Progress Notes (Signed)
SUBJECTIVE: Chest wall sore.   Tele: paced 80 bpm  BP (!) 92/48   Pulse (!) 44   Temp 98.6 F (37 C)   Resp (!) 30   Ht 5\' 2"  (1.575 m)   Wt 181 lb (82.1 kg)   SpO2 100%   BMI 33.10 kg/m   Intake/Output Summary (Last 24 hours) at 05/09/16 1001 Last data filed at 05/09/16 0800  Gross per 24 hour  Intake          4846.67 ml  Output             5380 ml  Net          -533.33 ml    PHYSICAL EXAM General: Well developed, well nourished, in no acute distress. Alert and oriented x 3.  Psych:  Good affect, responds appropriately Neck: No JVD. No masses noted.  Lungs: Clear bilaterally with no wheezes or rhonci noted.  Heart: RRR with no murmurs noted. Abdomen: Bowel sounds are present. Soft, non-tender.  Extremities: No lower extremity edema.   LABS: Basic Metabolic Panel:  Recent Labs  40/98/1111/27/17 0516 05/08/16 2004 05/09/16 0400  NA 138 136 134*  K 4.9 6.1* 4.5  CL 106  --  103  CO2 24  --  23  GLUCOSE 97 152* 206*  BUN 15  --  17  CREATININE 1.44*  --  1.49*  CALCIUM 10.8*  --  10.0  MG  --   --  3.1*   CBC:  Recent Labs  05/08/16 2000 05/08/16 2004 05/09/16 0400  WBC 10.3  --  8.4  HGB 11.0* 11.2* 10.1*  HCT 34.2* 33.0* 31.7*  MCV 89.3  --  89.3  PLT 140*  --  141*   Current Meds: . acetaminophen  1,000 mg Oral Q6H   Or  . acetaminophen (TYLENOL) oral liquid 160 mg/5 mL  1,000 mg Per Tube Q6H  . aspirin EC  325 mg Oral Daily   Or  . aspirin  324 mg Per Tube Daily  . atorvastatin  80 mg Oral q1800  . bisacodyl  10 mg Oral Daily   Or  . bisacodyl  10 mg Rectal Daily  . cefUROXime (ZINACEF)  IV  1.5 g Intravenous Q12H  . docusate sodium  200 mg Oral Daily  . enoxaparin (LOVENOX) injection  40 mg Subcutaneous QHS  . famotidine (PEPCID) IV  20 mg Intravenous Q12H  . insulin aspart  0-24 Units Subcutaneous Q4H  . insulin detemir  20 Units Subcutaneous Daily  . [START ON 05/10/2016] insulin detemir  20 Units Subcutaneous Daily  . insulin  regular  0-10 Units Intravenous TID WC  . mouth rinse  15 mL Mouth Rinse BID  . metoprolol tartrate  12.5 mg Oral BID   Or  . metoprolol tartrate  12.5 mg Per Tube BID  . [START ON 05/10/2016] pantoprazole  40 mg Oral Daily  . potassium chloride (KCL MULTIRUN) 30 mEq in 265 mL IVPB  30 mEq Intravenous Once  . sodium chloride flush  10-40 mL Intracatheter Q12H  . sodium chloride flush  3 mL Intravenous Q12H     ASSESSMENT AND PLAN:  1. CAD with unstable angina/NSTEMI:  Severe 3 vessel CAD by cath 05/03/16 with left main disease. He is now s/p CABG. He is stable this am. Continue ASA, statin. Resume Ace-inh when BP stable. Would d/c metoprolol given bradycardia (am dose held for HR less than 60)   2. Ischemic cardiomyopathy:  Moderate LV systolic dysfunction. Hopefully will improve post bypass.  Will resume medical therapy as BP tolerates.   3. Sinus Bradycardia: beta blocker on hold. Pacing this am. Underlying rate is 50 bpm. May end up needing permanent pacemaker.   Allen [name]  11/28/201710:01 AM

## 2016-05-09 NOTE — Anesthesia Postprocedure Evaluation (Signed)
Anesthesia Post Note  Patient: Allen [name]  Procedure(s) Performed: Procedure(s) (LRB): CORONARY ARTERY BYPASS GRAFTING (CABG) x 5 (N/A) TRANSESOPHAGEAL ECHOCARDIOGRAM (TEE) (N/A) ENDOVEIN HARVEST OF GREATER SAPHENOUS VEIN  Patient location during evaluation: SICU Anesthesia Type: General Level of consciousness: sedated and patient remains intubated per anesthesia plan Pain management: pain level controlled Vital Signs Assessment: post-procedure vital signs reviewed and stable Respiratory status: patient remains intubated per anesthesia plan Cardiovascular status: stable Anesthetic complications: no    Last Vitals:  Vitals:   05/09/16 0900 05/09/16 1000  BP: 99/62 103/67  Pulse: 80 78  Resp: (!) 26 18  Temp:      Last Pain:  Vitals:   05/09/16 0005  TempSrc:   PainSc: 6                  Hailly Fess,JAMES TERRILL

## 2016-05-10 ENCOUNTER — Inpatient Hospital Stay (HOSPITAL_COMMUNITY): Payer: Medicare PPO

## 2016-05-10 LAB — CBC
HCT: 27.5 % — ABNORMAL LOW (ref 39.0–52.0)
Hemoglobin: 9 g/dL — ABNORMAL LOW (ref 13.0–17.0)
MCH: 29.2 pg (ref 26.0–34.0)
MCHC: 32.7 g/dL (ref 30.0–36.0)
MCV: 89.3 fL (ref 78.0–100.0)
PLATELETS: 129 10*3/uL — AB (ref 150–400)
RBC: 3.08 MIL/uL — ABNORMAL LOW (ref 4.22–5.81)
RDW: 15.4 % (ref 11.5–15.5)
WBC: 9.2 10*3/uL (ref 4.0–10.5)

## 2016-05-10 LAB — BASIC METABOLIC PANEL
ANION GAP: 5 (ref 5–15)
Anion gap: 6 (ref 5–15)
BUN: 25 mg/dL — AB (ref 6–20)
BUN: 30 mg/dL — AB (ref 6–20)
CALCIUM: 9.8 mg/dL (ref 8.9–10.3)
CHLORIDE: 102 mmol/L (ref 101–111)
CO2: 25 mmol/L (ref 22–32)
CO2: 26 mmol/L (ref 22–32)
CREATININE: 1.83 mg/dL — AB (ref 0.61–1.24)
Calcium: 9.8 mg/dL (ref 8.9–10.3)
Chloride: 102 mmol/L (ref 101–111)
Creatinine, Ser: 1.8 mg/dL — ABNORMAL HIGH (ref 0.61–1.24)
GFR calc Af Amer: 38 mL/min — ABNORMAL LOW (ref >60)
GFR calc Af Amer: 39 mL/min — ABNORMAL LOW (ref >60)
GFR calc non Af Amer: 33 mL/min — ABNORMAL LOW (ref >60)
GFR, EST NON AFRICAN AMERICAN: 34 mL/min — AB (ref >60)
GLUCOSE: 152 mg/dL — AB (ref 65–99)
Glucose, Bld: 130 mg/dL — ABNORMAL HIGH (ref 65–99)
POTASSIUM: 5.1 mmol/L (ref 3.5–5.1)
Potassium: 5.2 mmol/L — ABNORMAL HIGH (ref 3.5–5.1)
SODIUM: 133 mmol/L — AB (ref 135–145)
SODIUM: 133 mmol/L — AB (ref 135–145)

## 2016-05-10 LAB — GLUCOSE, CAPILLARY
GLUCOSE-CAPILLARY: 100 mg/dL — AB (ref 65–99)
GLUCOSE-CAPILLARY: 128 mg/dL — AB (ref 65–99)
Glucose-Capillary: 129 mg/dL — ABNORMAL HIGH (ref 65–99)
Glucose-Capillary: 138 mg/dL — ABNORMAL HIGH (ref 65–99)
Glucose-Capillary: 141 mg/dL — ABNORMAL HIGH (ref 65–99)
Glucose-Capillary: 117 mg/dL — ABNORMAL HIGH (ref 65–99)

## 2016-05-10 LAB — COOXEMETRY PANEL
CARBOXYHEMOGLOBIN: 1.3 % (ref 0.5–1.5)
Methemoglobin: 1.3 % (ref 0.0–1.5)
O2 Saturation: 62.1 %
Total hemoglobin: 8.8 g/dL — ABNORMAL LOW (ref 12.0–16.0)

## 2016-05-10 MED ORDER — DOPAMINE-DEXTROSE 3.2-5 MG/ML-% IV SOLN
2.0000 ug/kg/min | INTRAVENOUS | Status: DC
  Administered 2016-05-10: 2 ug/kg/min via INTRAVENOUS
  Filled 2016-05-10: qty 250

## 2016-05-10 MED ORDER — CLOPIDOGREL BISULFATE 75 MG PO TABS
75.0000 mg | ORAL_TABLET | Freq: Every day | ORAL | Status: DC
  Administered 2016-05-10 – 2016-05-16 (×7): 75 mg via ORAL
  Filled 2016-05-10 (×7): qty 1

## 2016-05-10 MED ORDER — SODIUM CHLORIDE 0.9 % IV SOLN: INTRAVENOUS | Status: DC

## 2016-05-10 MED ORDER — ASPIRIN EC 81 MG PO TBEC
81.0000 mg | DELAYED_RELEASE_TABLET | Freq: Every day | ORAL | Status: DC
  Administered 2016-05-10 – 2016-05-12 (×3): 81 mg via ORAL
  Filled 2016-05-10 (×3): qty 1

## 2016-05-10 MED ORDER — ASPIRIN 81 MG PO CHEW: 81.0000 mg | CHEWABLE_TABLET | Freq: Every day | ORAL | Status: DC

## 2016-05-10 NOTE — Progress Notes (Signed)
During assessment, checked pt's underlying heart rhythm under pacer. Pt currently NSR in the 70s with some PVCs. Adjusted pacer to backup rate of 60. Pt alert and oriented x 4 and hemodynamically stable at this time, BP on current settings 116/74. Will continue to monitor closely.  Herma ArdMOSELEY, Rolland Steinert F, RN

## 2016-05-10 NOTE — Progress Notes (Signed)
SUBJECTIVE:No complaints.   Tele: paced  BP (!) 112/57   Pulse 77   Temp 98.4 F (36.9 C) (Oral)   Resp 18   Ht 5\' 2"  (1.575 m)   Wt 180 lb 5.4 oz (81.8 kg)   SpO2 93%   BMI 32.98 kg/m   Intake/Output Summary (Last 24 hours) at 05/10/16 0839 Last data filed at 05/10/16 0700  Gross per 24 hour  Intake           648.08 ml  Output              940 ml  Net          -291.92 ml    PHYSICAL EXAM General: Well developed, well nourished, in no acute distress. Alert and oriented x 3.  Psych:  Good affect, responds appropriately Neck: No JVD. No masses noted.  Lungs: Clear bilaterally with no wheezes or rhonci noted.  Heart: RRR with no murmurs noted. Abdomen: Bowel sounds are present. Soft, non-tender.  Extremities: No lower extremity edema.   LABS: Basic Metabolic Panel:  Recent Labs  16/03/9610/28/17 0400 05/09/16 1633 05/09/16 1649 05/10/16 0330  NA 134*  --  134* 133*  K 4.5  --  4.9 5.2*  CL 103  --  101 102  CO2 23  --   --  25  GLUCOSE 206*  --  134* 130*  BUN 17  --  21* 25*  CREATININE 1.49* 1.42* 1.50* 1.83*  CALCIUM 10.0  --   --  9.8  MG 3.1* 2.6*  --   --    CBC:  Recent Labs  05/09/16 1633 05/09/16 1649 05/10/16 0330  WBC 9.4  --  9.2  HGB 9.1* 9.9* 9.0*  HCT 28.6* 29.0* 27.5*  MCV 88.5  --  89.3  PLT 135*  --  129*    Current Meds: . acetaminophen  1,000 mg Oral Q6H   Or  . acetaminophen (TYLENOL) oral liquid 160 mg/5 mL  1,000 mg Per Tube Q6H  . aspirin EC  81 mg Oral Daily   Or  . aspirin  81 mg Per Tube Daily  . atorvastatin  80 mg Oral q1800  . bisacodyl  10 mg Oral Daily   Or  . bisacodyl  10 mg Rectal Daily  . cefUROXime (ZINACEF)  IV  1.5 g Intravenous Q12H  . clopidogrel  75 mg Oral Daily  . docusate sodium  200 mg Oral Daily  . enoxaparin (LOVENOX) injection  40 mg Subcutaneous QHS  . insulin aspart  0-24 Units Subcutaneous Q4H  . insulin detemir  20 Units Subcutaneous Daily  . insulin detemir  20 Units Subcutaneous Daily   . insulin regular  0-10 Units Intravenous TID WC  . mouth rinse  15 mL Mouth Rinse BID  . pantoprazole  40 mg Oral Daily  . sodium chloride flush  10-40 mL Intracatheter Q12H  . sodium chloride flush  3 mL Intravenous Q12H     ASSESSMENT AND PLAN:  1. CAD with unstable angina/NSTEMI: Severe 3 vessel CAD by cath 05/03/16 with left main disease. He is now POD #2 s/p CABG. He is stable this am. Continue ASA, Plavix, statin. Resume Ace-inh when BP stable. No beta blocker with bradycardia.   2. Ischemic cardiomyopathy: Moderate LV systolic dysfunction. Hopefully will improve post bypass.  Will resume medical therapy as BP tolerates.   3. Sinus Bradycardia: beta blocker on hold. Pacing this am. Underlying rate is 50 bpm.  May end up needing permanent pacemaker.   [name]  11/29/20178:39 AM

## 2016-05-10 NOTE — Progress Notes (Signed)
Patient ID: Allen [name], male   DOB: 07/24/1933, 82 y.o.   MRN: [id number] EVENING ROUNDS NOTE :     [address]       [place] [id number]             [phone]                 2 Days Post-Op Procedure(s) (LRB): CORONARY ARTERY BYPASS GRAFTING (CABG) x 5 (N/A) TRANSESOPHAGEAL ECHOCARDIOGRAM (TEE) (N/A) ENDOVEIN HARVEST OF GREATER SAPHENOUS VEIN  Total Length of Stay:  LOS: 7 days  BP 117/71 (BP Location: Right Arm)   Pulse 80   Temp 98.5 F (36.9 C) (Oral)   Resp 13   Ht 5\' 2"  (1.575 m)   Wt 180 lb 5.4 oz (81.8 kg)   SpO2 91%   BMI 32.98 kg/m   .Intake/Output      11/28 0701 - 11/29 0700 11/29 0701 - 11/30 0700   P.O. 120 100   I.V. (mL/kg) 464.4 (5.7) 168.2 (2.1)   Blood     NG/GT     IV Piggyback 100    Total Intake(mL/kg) 684.4 (8.4) 268.2 (3.3)   Urine (mL/kg/hr) 960 (0.5) 325 (0.4)   Emesis/NG output 50 (0)    Blood     Chest Tube 30 (0)    Total Output 1040 325   Net -355.6 -56.8          . sodium chloride    . sodium chloride 250 mL (05/09/16 0800)  . sodium chloride 1 mL/hr at 05/09/16 0600  . DOPamine 2 mcg/kg/min (05/10/16 1500)  . lactated ringers 20 mL/hr at 05/10/16 1500     Lab Results  Component Value Date   WBC 9.2 05/10/2016   HGB 9.0 (L) 05/10/2016   HCT 27.5 (L) 05/10/2016   PLT 129 (L) 05/10/2016   GLUCOSE 130 (H) 05/10/2016   CHOL 170 11/02/2014   TRIG 121 11/02/2014   HDL 45 11/02/2014   LDLCALC 101 (H) 11/02/2014   ALT 26 05/07/2016   AST 50 (H) 05/07/2016   NA 133 (L) 05/10/2016   K 5.2 (H) 05/10/2016   CL 102 05/10/2016   CREATININE 1.83 (H) 05/10/2016   BUN 25 (H) 05/10/2016   CO2 25 05/10/2016   TSH 3.413 11/01/2014   INR 1.21 05/08/2016   HGBA1C 5.9 (H) 05/07/2016   Walked 300 feet tonight  Stable VS On dopamine for renal prop cr 1.49 now 1.8 , monitor  Mild acte on chronic kidney injury    Delight OvensEdward B Faustine Tates MD  Beeper [phone] Office [phone] 05/10/2016 4:42 PM

## 2016-05-10 NOTE — Care Management Important Message (Signed)
Important Message  Patient Details  Name: Allen [name] MRN: [id number] Date of Birth: 05/07/1934   Medicare Important Message Given:  Yes    Sadi Arave Abena 05/10/2016, 10:25 AM

## 2016-05-10 NOTE — Progress Notes (Signed)
2 Days Post-Op Procedure(s) (LRB): CORONARY ARTERY BYPASS GRAFTING (CABG) x 5 (N/A) TRANSESOPHAGEAL ECHOCARDIOGRAM (TEE) (N/A) ENDOVEIN HARVEST OF GREATER SAPHENOUS VEIN Subjective:  No complaints  Objective: Vital signs in last 24 hours: Temp:  [98 F (36.7 C)-99.2 F (37.3 C)] 98.4 F (36.9 C) (11/29 0740) Pulse Rate:  [36-82] 79 (11/29 0740) Cardiac Rhythm: Atrial paced (11/29 0400) Resp:  [14-32] 16 (11/29 0740) BP: (89-118)/(59-84) 113/66 (11/29 0740) SpO2:  [89 %-100 %] 95 % (11/29 0740) Arterial Line BP: (88-128)/(45-66) 90/46 (11/29 0000) Weight:  [81.8 kg (180 lb 5.4 oz)] 81.8 kg (180 lb 5.4 oz) (11/29 0600)  Hemodynamic parameters for last 24 hours:    Intake/Output from previous day: 11/28 0701 - 11/29 0700 In: 684.4 [P.O.:120; I.V.:464.4; IV Piggyback:100] Out: 1040 [Urine:960; Emesis/NG output:50; Chest Tube:30] Intake/Output this shift: No intake/output data recorded.  General appearance: alert and cooperative Neurologic: intact Heart: regular rate and rhythm, S1, S2 normal, no murmur, click, rub or gallop Lungs: diminished breath sounds left base Extremities: edema mild Wound: dressing dry  Lab Results:  Recent Labs  05/09/16 1633 05/09/16 1649 05/10/16 0330  WBC 9.4  --  9.2  HGB 9.1* 9.9* 9.0*  HCT 28.6* 29.0* 27.5*  PLT 135*  --  129*   BMET:  Recent Labs  05/09/16 0400  05/09/16 1649 05/10/16 0330  NA 134*  --  134* 133*  K 4.5  --  4.9 5.2*  CL 103  --  101 102  CO2 23  --   --  25  GLUCOSE 206*  --  134* 130*  BUN 17  --  21* 25*  CREATININE 1.49*  < > 1.50* 1.83*  CALCIUM 10.0  --   --  9.8  < > = values in this interval not displayed.  PT/INR:  Recent Labs  05/08/16 2000  LABPROT 15.4*  INR 1.21   ABG    Component Value Date/Time   PHART 7.363 05/09/2016 0305   HCO3 22.4 05/09/2016 0305   TCO2 23 05/09/2016 1649   ACIDBASEDEF 3.0 (H) 05/09/2016 0305   O2SAT 62.1 05/10/2016 0454   CBG (last 3)   Recent Labs  05/10/16 0000 05/10/16 0408 05/10/16 0738  GLUCAP 118* 129* 128*   CLINICAL DATA:  Status post CABG.  EXAM: PORTABLE CHEST 1 VIEW  COMPARISON:  05/09/2016  FINDINGS: Swan-Ganz catheter, left chest tube and mediastinal drains have been removed. No pneumothorax identified. Lung volumes slightly lower bilaterally with bibasilar atelectasis remaining. No overt edema. Stable cardiac enlargement.  IMPRESSION: Lower lung volumes.  No pneumothorax.   Electronically Signed   By: Irish LackGlenn  Yamagata M.D.   On: 05/10/2016 08:00     Assessment/Plan: S/P Procedure(s) (LRB): CORONARY ARTERY BYPASS GRAFTING (CABG) x 5 (N/A) TRANSESOPHAGEAL ECHOCARDIOGRAM (TEE) (N/A) ENDOVEIN HARVEST OF GREATER SAPHENOUS VEIN  Severe multi-vessel CAD with EF 30-35% on TEE with grade 2 diastolic dysfunction. LBBB with sinus brady preop DM with Hgb A1c of 5.9 on oral meds and Levemir. Stage 3 CKD.  He is hemodynamically stable postop. Rhythm is sinus brady 59 so will continue atrial pacing at 80 to maximize CO. Co-ox was ok at 62% this am. Hold off on beta blocker with bradycardia. No ACE I at this time due to elevated creat.  Acute on chronic renal failure with rise in creat to 1.83 this am. His urine output is 15-30 cc hr so will put on renal dose dopamine at 2 mcg. This is probably related to DM and pump run  and should resolve. Hold off on diuresis for now. K+ 5.2 this am so will check BMET this pm.  Continue IS and ambulation.  DM: glucose under good control. Continue Levemir and SSI.     LOS: 7 days    Alleen BorneBryan K Bartle 05/10/2016

## 2016-05-11 LAB — COOXEMETRY PANEL
CARBOXYHEMOGLOBIN: 1.1 % (ref 0.5–1.5)
Methemoglobin: 1 % (ref 0.0–1.5)
O2 SAT: 63.9 %
Total hemoglobin: 9.3 g/dL — ABNORMAL LOW (ref 12.0–16.0)

## 2016-05-11 LAB — BASIC METABOLIC PANEL
ANION GAP: 5 (ref 5–15)
BUN: 32 mg/dL — AB (ref 6–20)
CHLORIDE: 103 mmol/L (ref 101–111)
CO2: 26 mmol/L (ref 22–32)
Calcium: 9.7 mg/dL (ref 8.9–10.3)
Creatinine, Ser: 1.83 mg/dL — ABNORMAL HIGH (ref 0.61–1.24)
GFR calc Af Amer: 38 mL/min — ABNORMAL LOW (ref >60)
GFR, EST NON AFRICAN AMERICAN: 33 mL/min — AB (ref >60)
GLUCOSE: 132 mg/dL — AB (ref 65–99)
POTASSIUM: 5.1 mmol/L (ref 3.5–5.1)
Sodium: 134 mmol/L — ABNORMAL LOW (ref 135–145)

## 2016-05-11 LAB — GLUCOSE, CAPILLARY
GLUCOSE-CAPILLARY: 105 mg/dL — AB (ref 65–99)
GLUCOSE-CAPILLARY: 110 mg/dL — AB (ref 65–99)
GLUCOSE-CAPILLARY: 121 mg/dL — AB (ref 65–99)
GLUCOSE-CAPILLARY: 143 mg/dL — AB (ref 65–99)
GLUCOSE-CAPILLARY: 146 mg/dL — AB (ref 65–99)
Glucose-Capillary: 116 mg/dL — ABNORMAL HIGH (ref 65–99)
Glucose-Capillary: 125 mg/dL — ABNORMAL HIGH (ref 65–99)

## 2016-05-11 LAB — CBC
HEMATOCRIT: 28.9 % — AB (ref 39.0–52.0)
HEMOGLOBIN: 9.1 g/dL — AB (ref 13.0–17.0)
MCH: 28.3 pg (ref 26.0–34.0)
MCHC: 31.5 g/dL (ref 30.0–36.0)
MCV: 89.8 fL (ref 78.0–100.0)
Platelets: 141 10*3/uL — ABNORMAL LOW (ref 150–400)
RBC: 3.22 MIL/uL — ABNORMAL LOW (ref 4.22–5.81)
RDW: 15.5 % (ref 11.5–15.5)
WBC: 8.1 10*3/uL (ref 4.0–10.5)

## 2016-05-11 MED ORDER — TRAMADOL HCL 50 MG PO TABS: 50.0000 mg | ORAL_TABLET | Freq: Two times a day (BID) | ORAL | Status: DC | PRN

## 2016-05-11 MED ORDER — LACTULOSE 10 GM/15ML PO SOLN
20.0000 g | Freq: Every day | ORAL | Status: DC | PRN
  Administered 2016-05-11: 20 g via ORAL
  Filled 2016-05-11: qty 30

## 2016-05-11 NOTE — Progress Notes (Addendum)
EVENING ROUNDS NOTE :     [address]       [place] [id number]             [phone]                 3 Days Post-Op Procedure(s) (LRB): CORONARY ARTERY BYPASS GRAFTING (CABG) x 5 (N/A) TRANSESOPHAGEAL ECHOCARDIOGRAM (TEE) (N/A) ENDOVEIN HARVEST OF GREATER SAPHENOUS VEIN  Total Length of Stay:  LOS: 8 days  BP 134/74 (BP Location: Left Arm)   Pulse 71   Temp 98.4 F (36.9 C) (Oral)   Resp 20   Ht 5\' 2"  (1.575 m)   Wt 179 lb 14.3 oz (81.6 kg)   SpO2 98%   BMI 32.90 kg/m   .Intake/Output      11/30 0701 - 12/01 0700   P.O. 480   I.V. (mL/kg) 277.2 (3.4)   Total Intake(mL/kg) 757.2 (9.3)   Urine (mL/kg/hr) 685 (0.6)   Stool 0 (0)   Total Output 685   Net +72.2       Stool Occurrence 1 x     . sodium chloride    . sodium chloride Stopped (05/10/16 0800)  . sodium chloride Stopped (05/10/16 0800)  . sodium chloride 20 mL/hr at 05/11/16 1900  . DOPamine 2 mcg/kg/min (05/11/16 1900)  . lactated ringers Stopped (05/10/16 2000)     Lab Results  Component Value Date   WBC 8.1 05/11/2016   HGB 9.1 (L) 05/11/2016   HCT 28.9 (L) 05/11/2016   PLT 141 (L) 05/11/2016   GLUCOSE 132 (H) 05/11/2016   CHOL 170 11/02/2014   TRIG 121 11/02/2014   HDL 45 11/02/2014   LDLCALC 101 (H) 11/02/2014   ALT 26 05/07/2016   AST 50 (H) 05/07/2016   NA 134 (L) 05/11/2016   K 5.1 05/11/2016   CL 103 05/11/2016   CREATININE 1.83 (H) 05/11/2016   BUN 32 (H) 05/11/2016   CO2 26 05/11/2016   TSH 3.413 11/01/2014   INR 1.21 05/08/2016   HGBA1C 5.9 (H) 05/07/2016   Patient awake and alert. Had constipation and given laxative. He had a bowel movement.  PE: CV-RRR, systolic murmur II/VI Pulmonary-Slightly diminished at bases Wounds-sternal wound is clean and dry. RLE wound is clean and dry.  Creatinine 1.83 today. On low dose Dopamine drip. Continue to monitor as Cr was  1.35 upon admission.  Doree FudgeDonielle Zimmerman PA-C 05/11/2016 8:03 PM

## 2016-05-11 NOTE — Progress Notes (Signed)
SUBJECTIVE: c/o constipation  Tele: sinus  BP 122/77   Pulse 71   Temp 98.7 F (37.1 C) (Oral)   Resp (!) 26   Ht 5\' 2"  (1.575 m)   Wt 179 lb 14.3 oz (81.6 kg)   SpO2 96%   BMI 32.90 kg/m   Intake/Output Summary (Last 24 hours) at 05/11/16 0936 Last data filed at 05/11/16 0700  Gross per 24 hour  Intake           697.84 ml  Output             1130 ml  Net          -432.16 ml    PHYSICAL EXAM General: Well developed, well nourished, in no acute distress. Alert and oriented x 3.  Psych:  Good affect, responds appropriately Neck: No JVD. No masses noted.  Lungs: Clear bilaterally with no wheezes or rhonci noted.  Heart: RRR with no murmurs noted. Abdomen: Bowel sounds are present. Soft, non-tender.  Extremities: No lower extremity edema.   LABS: Basic Metabolic Panel:  Recent Labs  16/03/9610/28/17 0400 05/09/16 1633  05/10/16 1630 05/11/16 0300  NA 134*  --   < > 133* 134*  K 4.5  --   < > 5.1 5.1  CL 103  --   < > 102 103  CO2 23  --   < > 26 26  GLUCOSE 206*  --   < > 152* 132*  BUN 17  --   < > 30* 32*  CREATININE 1.49* 1.42*  < > 1.80* 1.83*  CALCIUM 10.0  --   < > 9.8 9.7  MG 3.1* 2.6*  --   --   --   < > = values in this interval not displayed. CBC:  Recent Labs  05/10/16 0330 05/11/16 0300  WBC 9.2 8.1  HGB 9.0* 9.1*  HCT 27.5* 28.9*  MCV 89.3 89.8  PLT 129* 141*   Current Meds: . acetaminophen  1,000 mg Oral Q6H   Or  . acetaminophen (TYLENOL) oral liquid 160 mg/5 mL  1,000 mg Per Tube Q6H  . aspirin EC  81 mg Oral Daily   Or  . aspirin  81 mg Per Tube Daily  . atorvastatin  80 mg Oral q1800  . bisacodyl  10 mg Oral Daily   Or  . bisacodyl  10 mg Rectal Daily  . clopidogrel  75 mg Oral Daily  . docusate sodium  200 mg Oral Daily  . enoxaparin (LOVENOX) injection  40 mg Subcutaneous QHS  . insulin aspart  0-24 Units Subcutaneous Q4H  . insulin detemir  20 Units Subcutaneous Daily  . insulin detemir  20 Units Subcutaneous Daily  .  mouth rinse  15 mL Mouth Rinse BID  . pantoprazole  40 mg Oral Daily  . sodium chloride flush  3 mL Intravenous Q12H     ASSESSMENT AND PLAN:  1. CAD with unstable angina/NSTEMI: Severe 3 vessel CAD by cath 05/03/16 with left main disease. He is now POD #3 s/p CABG. He is stable this am. Continue ASA, Plavix, statin. Resume Ace-inh when BP stable. No beta blocker with bradycardia.   2. Ischemic cardiomyopathy: Moderate LV systolic dysfunction. Hopefully will improve post bypass. Will resume medical therapy as BP tolerates.   3. Sinus Bradycardia: beta blocker on hold. He is no longer requiring the pacemaker. He is sinus this am. No indication for permanent pacing.     Verne Carrowhristopher McAlhany  11/30/20179:36 AM

## 2016-05-11 NOTE — Progress Notes (Addendum)
TCTS DAILY ICU PROGRESS NOTE                   [name]            [place] [id number]          [phone]   3 Days Post-Op Procedure(s) (LRB): CORONARY ARTERY BYPASS GRAFTING (CABG) x 5 (N/A) TRANSESOPHAGEAL ECHOCARDIOGRAM (TEE) (N/A) ENDOVEIN HARVEST OF GREATER SAPHENOUS VEIN  Total Length of Stay:  LOS: 8 days   Subjective: Feels ok, some soreness with movement   Objective: Vital signs in last 24 hours: Temp:  [97.9 F (36.6 C)-99 F (37.2 C)] 98.7 F (37.1 C) (11/30 0400) Pulse Rate:  [67-82] 71 (11/30 0700) Cardiac Rhythm: Normal sinus rhythm (11/30 0400) Resp:  [11-37] 26 (11/30 0700) BP: (91-131)/(55-90) 122/77 (11/30 0700) SpO2:  [91 %-98 %] 96 % (11/30 0700) Weight:  [179 lb 14.3 oz (81.6 kg)] 179 lb 14.3 oz (81.6 kg) (11/30 0500)  Filed Weights   05/09/16 0600 05/10/16 0600 05/11/16 0500  Weight: 181 lb (82.1 kg) 180 lb 5.4 oz (81.8 kg) 179 lb 14.3 oz (81.6 kg)    Weight change: -7.1 oz (-0.2 kg)   Hemodynamic parameters for last 24 hours:    Intake/Output from previous day: 11/29 0701 - 11/30 0700 In: 727.8 [P.O.:200; I.V.:527.8] Out: 1155 [Urine:1155]  Intake/Output this shift: No intake/output data recorded.  Current Meds: Scheduled Meds: . acetaminophen  1,000 mg Oral Q6H   Or  . acetaminophen (TYLENOL) oral liquid 160 mg/5 mL  1,000 mg Per Tube Q6H  . aspirin EC  81 mg Oral Daily   Or  . aspirin  81 mg Per Tube Daily  . atorvastatin  80 mg Oral q1800  . bisacodyl  10 mg Oral Daily   Or  . bisacodyl  10 mg Rectal Daily  . clopidogrel  75 mg Oral Daily  . docusate sodium  200 mg Oral Daily  . enoxaparin (LOVENOX) injection  40 mg Subcutaneous QHS  . insulin aspart  0-24 Units Subcutaneous Q4H  . insulin detemir  20 Units Subcutaneous Daily  . insulin detemir  20 Units Subcutaneous Daily  . mouth rinse  15 mL Mouth Rinse BID  . pantoprazole  40 mg Oral Daily  . sodium chloride flush  3 mL Intravenous Q12H    Continuous Infusions: . sodium chloride    . sodium chloride Stopped (05/10/16 0800)  . sodium chloride Stopped (05/10/16 0800)  . sodium chloride 20 mL/hr at 05/11/16 0700  . DOPamine 2 mcg/kg/min (05/11/16 0700)  . lactated ringers Stopped (05/10/16 2000)   PRN Meds:.sodium chloride, morphine injection, ondansetron (ZOFRAN) IV, oxyCODONE, sodium chloride flush, traMADol  General appearance: alert, cooperative and no distress Heart: regular rate and rhythm and 2/6 systolic mutmur Lungs: mildly dim in bases Abdomen: mod distension, + BS, nontender Extremities: minor RLE edema Wound: incis healing well (evh) , chest dressing in place  Lab Results: CBC: Recent Labs  05/10/16 0330 05/11/16 0300  WBC 9.2 8.1  HGB 9.0* 9.1*  HCT 27.5* 28.9*  PLT 129* 141*   BMET:  Recent Labs  05/10/16 1630 05/11/16 0300  NA 133* 134*  K 5.1 5.1  CL 102 103  CO2 26 26  GLUCOSE 152* 132*  BUN 30* 32*  CREATININE 1.80* 1.83*  CALCIUM 9.8 9.7    CMET: Lab Results  Component Value Date   WBC 8.1 05/11/2016   HGB 9.1 (L) 05/11/2016   HCT 28.9 (  L) 05/11/2016   PLT 141 (L) 05/11/2016   GLUCOSE 132 (H) 05/11/2016   CHOL 170 11/02/2014   TRIG 121 11/02/2014   HDL 45 11/02/2014   LDLCALC 101 (H) 11/02/2014   ALT 26 05/07/2016   AST 50 (H) 05/07/2016   NA 134 (L) 05/11/2016   K 5.1 05/11/2016   CL 103 05/11/2016   CREATININE 1.83 (H) 05/11/2016   BUN 32 (H) 05/11/2016   CO2 26 05/11/2016   TSH 3.413 11/01/2014   INR 1.21 05/08/2016   HGBA1C 5.9 (H) 05/07/2016    PT/INR:  Recent Labs  05/08/16 2000  LABPROT 15.4*  INR 1.21   Radiology: No results found.   Assessment/Plan: S/P Procedure(s) (LRB): CORONARY ARTERY BYPASS GRAFTING (CABG) x 5 (N/A) TRANSESOPHAGEAL ECHOCARDIOGRAM (TEE) (N/A) ENDOVEIN HARVEST OF GREATER SAPHENOUS VEIN  1 conts to progress nicely 2 sinus with BBB, freq PVC's- no beta blocker, hopefully wont require PPM 3 BP good currently, no ACE-I with  renal insuff for now 4 CO-OX 64- hopefully can get off dopamine today 5 renal fxn stable with creat 1.8 6 minor volume overload , UO appears adequate currently- wt = preop if accurate 7 ABL anemia stable /improved 8 thrombocytopenia improving 9 AHF assisting with management Allen [name],Allen [name] 05/11/2016 7:24 AM   Patient seen and examined, agree with above Mild discomfort with movement Creatinine stable at 1.8 on 3 mcg/k/min of dopamine  Kiele Heavrin C. Dorris FetchHendrickson, MD [facility] [phone]

## 2016-05-12 LAB — BASIC METABOLIC PANEL
ANION GAP: 7 (ref 5–15)
BUN: 25 mg/dL — ABNORMAL HIGH (ref 6–20)
CHLORIDE: 104 mmol/L (ref 101–111)
CO2: 27 mmol/L (ref 22–32)
Calcium: 10.1 mg/dL (ref 8.9–10.3)
Creatinine, Ser: 1.32 mg/dL — ABNORMAL HIGH (ref 0.61–1.24)
GFR calc Af Amer: 57 mL/min — ABNORMAL LOW (ref >60)
GFR, EST NON AFRICAN AMERICAN: 49 mL/min — AB (ref >60)
GLUCOSE: 97 mg/dL (ref 65–99)
POTASSIUM: 4.8 mmol/L (ref 3.5–5.1)
Sodium: 138 mmol/L (ref 135–145)

## 2016-05-12 LAB — CBC
HCT: 29.2 % — ABNORMAL LOW (ref 39.0–52.0)
HEMOGLOBIN: 9.4 g/dL — AB (ref 13.0–17.0)
MCH: 29 pg (ref 26.0–34.0)
MCHC: 32.2 g/dL (ref 30.0–36.0)
MCV: 90.1 fL (ref 78.0–100.0)
PLATELETS: 163 10*3/uL (ref 150–400)
RBC: 3.24 MIL/uL — AB (ref 4.22–5.81)
RDW: 15.8 % — ABNORMAL HIGH (ref 11.5–15.5)
WBC: 7.2 10*3/uL (ref 4.0–10.5)

## 2016-05-12 LAB — COOXEMETRY PANEL
Carboxyhemoglobin: 1.1 % (ref 0.5–1.5)
METHEMOGLOBIN: 0.8 % (ref 0.0–1.5)
O2 SAT: 57.7 %
TOTAL HEMOGLOBIN: 9 g/dL — AB (ref 12.0–16.0)

## 2016-05-12 LAB — GLUCOSE, CAPILLARY
GLUCOSE-CAPILLARY: 73 mg/dL (ref 65–99)
Glucose-Capillary: 85 mg/dL (ref 65–99)
Glucose-Capillary: 136 mg/dL — ABNORMAL HIGH (ref 65–99)
Glucose-Capillary: 110 mg/dL — ABNORMAL HIGH (ref 65–99)

## 2016-05-12 LAB — MAGNESIUM: MAGNESIUM: 2.6 mg/dL — AB (ref 1.7–2.4)

## 2016-05-12 MED ORDER — CARVEDILOL 3.125 MG PO TABS: 3.1250 mg | ORAL_TABLET | Freq: Two times a day (BID) | ORAL | Status: DC

## 2016-05-12 MED ORDER — SODIUM CHLORIDE 0.9% FLUSH
3.0000 mL | Freq: Two times a day (BID) | INTRAVENOUS | Status: DC
  Administered 2016-05-12 – 2016-05-15 (×4): 3 mL via INTRAVENOUS

## 2016-05-12 MED ORDER — OXYCODONE HCL 5 MG PO TABS: 5.0000 mg | ORAL_TABLET | ORAL | Status: DC | PRN

## 2016-05-12 MED ORDER — SODIUM CHLORIDE 0.9 % IV SOLN: 250.0000 mL | INTRAVENOUS | Status: DC | PRN

## 2016-05-12 MED ORDER — FAMOTIDINE 20 MG PO TABS
20.0000 mg | ORAL_TABLET | Freq: Two times a day (BID) | ORAL | Status: DC
  Administered 2016-05-12 – 2016-05-16 (×8): 20 mg via ORAL
  Filled 2016-05-12 (×8): qty 1

## 2016-05-12 MED ORDER — ONDANSETRON HCL 4 MG PO TABS: 4.0000 mg | ORAL_TABLET | Freq: Four times a day (QID) | ORAL | Status: DC | PRN

## 2016-05-12 MED ORDER — ASPIRIN EC 81 MG PO TBEC
81.0000 mg | DELAYED_RELEASE_TABLET | Freq: Every day | ORAL | Status: DC
  Administered 2016-05-13 – 2016-05-16 (×4): 81 mg via ORAL
  Filled 2016-05-12 (×4): qty 1

## 2016-05-12 MED ORDER — ACETAMINOPHEN 325 MG PO TABS
650.0000 mg | ORAL_TABLET | Freq: Four times a day (QID) | ORAL | Status: DC | PRN
  Administered 2016-05-13 – 2016-05-15 (×4): 650 mg via ORAL
  Filled 2016-05-12 (×4): qty 2

## 2016-05-12 MED ORDER — MOVING RIGHT ALONG BOOK
Freq: Once | Status: AC
  Administered 2016-05-12: 13:00:00
  Filled 2016-05-12: qty 1

## 2016-05-12 MED ORDER — INSULIN ASPART 100 UNIT/ML ~~LOC~~ SOLN
0.0000 [IU] | Freq: Three times a day (TID) | SUBCUTANEOUS | Status: DC
  Administered 2016-05-13 (×2): 2 [IU] via SUBCUTANEOUS
  Administered 2016-05-14: 4 [IU] via SUBCUTANEOUS
  Administered 2016-05-14: 2 [IU] via SUBCUTANEOUS

## 2016-05-12 MED ORDER — SODIUM CHLORIDE 0.9% FLUSH
3.0000 mL | INTRAVENOUS | Status: DC | PRN
Start: 2016-05-12 — End: 2016-05-16

## 2016-05-12 MED ORDER — TRAMADOL HCL 50 MG PO TABS: 50.0000 mg | ORAL_TABLET | ORAL | Status: DC | PRN

## 2016-05-12 MED ORDER — ONDANSETRON HCL 4 MG/2ML IJ SOLN: 4.0000 mg | Freq: Four times a day (QID) | INTRAMUSCULAR | Status: DC | PRN

## 2016-05-12 NOTE — Progress Notes (Signed)
4 Days Post-Op Procedure(s) (LRB): CORONARY ARTERY BYPASS GRAFTING (CABG) x 5 (N/A) TRANSESOPHAGEAL ECHOCARDIOGRAM (TEE) (N/A) ENDOVEIN HARVEST OF GREATER SAPHENOUS VEIN Subjective:  Burning from foley. Otherwise ok. Ambulating Bowels working  Objective: Vital signs in last 24 hours: Temp:  [98.3 F (36.8 C)-98.9 F (37.2 C)] 98.5 F (36.9 C) (12/01 0400) Pulse Rate:  [66-81] 80 (12/01 0700) Cardiac Rhythm: Normal sinus rhythm (12/01 0400) Resp:  [13-27] 21 (12/01 0700) BP: (105-166)/(50-87) 128/75 (12/01 0700) SpO2:  [88 %-100 %] 93 % (12/01 0700) Weight:  [80.1 kg (176 lb 9.4 oz)] 80.1 kg (176 lb 9.4 oz) (12/01 0500)  Hemodynamic parameters for last 24 hours:    Intake/Output from previous day: 11/30 0701 - 12/01 0700 In: 1023.2 [P.O.:480; I.V.:543.2] Out: 1490 [Urine:1490] Intake/Output this shift: No intake/output data recorded.  General appearance: alert and cooperative Neurologic: intact Heart: regular rate and rhythm, S1, S2 normal, no murmur, click, rub or gallop and frequent extra beats Lungs: diminished breath sounds bibasilar Extremities: extremities normal, atraumatic, no cyanosis or edema Wound: incisions ok  Lab Results:  Recent Labs  05/11/16 0300 05/12/16 0445  WBC 8.1 7.2  HGB 9.1* 9.4*  HCT 28.9* 29.2*  PLT 141* 163   BMET:  Recent Labs  05/11/16 0300 05/12/16 0445  NA 134* 138  K 5.1 4.8  CL 103 104  CO2 26 27  GLUCOSE 132* 97  BUN 32* 25*  CREATININE 1.83* 1.32*  CALCIUM 9.7 10.1    PT/INR: No results for input(s): LABPROT, INR in the last 72 hours. ABG    Component Value Date/Time   PHART 7.363 05/09/2016 0305   HCO3 22.4 05/09/2016 0305   TCO2 23 05/09/2016 1649   ACIDBASEDEF 3.0 (H) 05/09/2016 0305   O2SAT 57.7 05/12/2016 0445   CBG (last 3)   Recent Labs  05/11/16 1939 05/11/16 2337 05/12/16 0349  GLUCAP 110* 105* 85    Assessment/Plan: S/P Procedure(s) (LRB): CORONARY ARTERY BYPASS GRAFTING (CABG) x 5  (N/A) TRANSESOPHAGEAL ECHOCARDIOGRAM (TEE) (N/A) ENDOVEIN HARVEST OF GREATER SAPHENOUS VEIN  He is hemodynamically stable. Co-ox 58% on dopamine 1 mcg.  Will discontinue. Rhythm is sinus with frequent PVC's. He had some bradycardia preop. He may tolerate low dose BB but will see how he does off dopamine. Renal function back to baseline. Weight back to baseline. Glucose under good control on Levemir and SSI.  Remove sleeve and foley. Check urine culture. Transfer to 2W and continue mobilization, IS   LOS: 9 days    Alleen BorneBryan K Bartle 05/12/2016

## 2016-05-12 NOTE — Discharge Summary (Signed)
Physician Discharge Summary  Patient ID: Allen [name] MRN: [id number] DOB/AGE: 80/16/1935 82 y.o.  Admit date: 05/03/2016 Discharge date: 05/16/2016  Admission Diagnoses:Non-STEMI  Discharge Diagnoses:  Principal Problem:   NSTEMI (non-ST elevated myocardial infarction) [facility]) Active Problems:   Hypertension   Hyperlipidemia   Chronic kidney disease   LBBB (left bundle branch block)   Ischemic cardiomyopathy   S/P CABG x 5  Patient Active Problem List   Diagnosis Date Noted  . S/P CABG x 5 05/08/2016  . NSTEMI (non-ST elevated myocardial infarction) (HCC) 05/03/2016  . Ischemic cardiomyopathy 11/26/2014  . CAD (coronary artery disease), native coronary artery 11/24/2014  . LBBB (left bundle branch block) 10/23/2014  . Mitral regurgitation 09/19/2013  . Shortness of breath 08/29/2013  . Dizziness   . Aortic valve sclerosis   . Aortic insufficiency   . Left ventricular hypertrophy   . Ejection fraction   . Abnormal chest x-ray   . Hypertension   . Hyperlipidemia   . Tobacco abuse   . Chronic kidney disease   . Sinus bradycardia   . Obstructive sleep apnea    HPI: 82 year old man with a known history of coronary artery disease. He was hospitalized at [facility] recently with chest discomfort. He had a stress test which showed some inferoapical ischemia and ejection fraction of 32%. He saw his cardiologist, Dr. Diona [name] couple of days ago and medical therapy was adjusted.  Earlier today, at about 5 PM, he began to have severe chest discomfort. It waxed and waned for several hours. He went to the ER at [facility]. He has a chronic left bundle branch block and they noted some ST changes even with the left bundle branch block. His troponin was positive. He was having intermittent runs of ventricular tachycardia with severe chest pain.  Upon arrival to [facility], his chest pain had resolved. He had been started on IV amiodarone at the time of leaving [facility]. His rhythm  stabilized. He was sent here for emergent catheterization.   Discharged Condition: good  Hospital Course: The patient did rule in for non-STEMI. His peak troponin was 15. He was taken for emergent cardiac catheterization where he was found to have severe three-vessel coronary artery disease with left main equivalent. He was seen in CT surgical consultation by Dr. Laneta [name] who agreed with recommendations to proceed with coronary artery surgical revascularization. On 05/08/2016 he was taken to the operating room where he underwent the below described procedure.  Postoperative hospital course:  Overall the patient has progressed well. He has maintained stable hemodynamics and did not require inotropic support. He did have some postoperative bradycardia but as transitioned  to a sinus rhythm, with LBBB and PVC's. Beta blocker currently not being initiated. Blood sugars have been controlled using standard protocols. All routine lines, monitors and drainage devices have been discontinued in the standard fashion. He has had some postoperative volume overload but is responding well to diuretics. He has an expected acute blood loss anemia which has stabilized. He did have some postoperative acute renal insufficiency on top of his mild chronic renal insufficiency. His most recent creatinine is 1.32. Incisions are noted to be healing well without evidence of infection. He is tolerating gradually increasing activities using standard cardiac rehabilitation protocols. At time of discharge the patient is felt to be quite stable.  Consults: cardiology  Significant Diagnostic Studies: routine labs and serial CXR's  Treatments: surgery:  CARDIOVASCULAR SURGERY OPERATIVE NOTE  05/09/2016  Surgeon:  Alleen Borne,  MD  First Assistant: Gershon CraneWayne Gold,  PA-C and Jari Favreessa Conte, [place]PA-C   Preoperative Diagnosis:  Severe multi-vessel coronary artery disease   Postoperative Diagnosis:   Same   Procedure:  1. Median Sternotomy 2. Extracorporeal circulation 3.   Coronary artery bypass grafting x 5   Left internal mammary graft to the LAD  SVG to diagonal  SVG to RCA  Sequential SVG to OM1 and OM2 4.   Endoscopic vein harvest from the right leg   Anesthesia:  General Endotracheal  Discharge Exam: Blood pressure 130/70, pulse 69, temperature 98.1 F (36.7 C), temperature source Oral, resp. rate 20, height 5\' 2"  (1.575 m), weight 168 lb 1.6 oz (76.2 kg), SpO2 95 %.  General appearance: alert, cooperative and no distress Heart: regular rate and rhythm and 2/6 systolic aortic murmur Lungs: clear to auscultation bilaterally Abdomen: soft, non-tender Extremities: no edema Wound: incis healing well  Disposition: 01-Home or Self Care  Discharge Instructions    Amb Referral to Cardiac Rehabilitation    Complete by:  As directed    Diagnosis:   CABG Comment - To [place] NSTEMI     CABG X ___:  5       Medication List    STOP taking these medications   ibuprofen 200 MG tablet Commonly known as:  ADVIL,MOTRIN   isosorbide mononitrate 30 MG 24 hr tablet Commonly known as:  IMDUR   isosorbide mononitrate 60 MG 24 hr tablet Commonly known as:  IMDUR   metFORMIN 500 MG tablet Commonly known as:  GLUCOPHAGE   nitroGLYCERIN 0.4 MG SL tablet Commonly known as:  NITROSTAT     TAKE these medications   amLODipine 10 MG tablet Commonly known as:  NORVASC TAKE 1 TABLET ONE TIME DAILY   aspirin 81 MG EC tablet Take 81 mg by mouth daily.   atorvastatin 80 MG tablet Commonly known as:  LIPITOR Take 1 tablet (80 mg total) by mouth daily at 6 PM.   clopidogrel 75 MG tablet Commonly known as:  PLAVIX TAKE 1 TABLET ONE TIME DAILY WITH BREAKFAST   glimepiride 2 MG tablet Commonly known as:  AMARYL Take 2 mg by mouth 2 (two) times daily.   Insulin Glargine 100 UNIT/ML Solostar Pen Commonly known as:  LANTUS SOLOSTAR Inject 20 Units into  the skin at bedtime. What changed:  how much to take   lisinopril 5 MG tablet Commonly known as:  PRINIVIL,ZESTRIL Take 1 tablet (5 mg total) by mouth daily. Start taking on:  05/17/2016 What changed:  See the new instructions.   oxyCODONE 5 MG immediate release tablet Commonly known as:  Oxy IR/ROXICODONE Take 1 tablet (5 mg total) by mouth every 6 (six) hours as needed for severe pain.   pantoprazole 20 MG tablet Commonly known as:  PROTONIX Take 1 tablet (20 mg total) by mouth daily.      Follow-up Information    Alleen BorneBryan K Bartle, MD Follow up.   Specialty:  Cardiothoracic Surgery Why:  The office will contact you with appointment to see Dr. Laneta SimmersBartle as well as nurse for suture removal. [facility] one half hour prior to appointment with Dr. Laneta SimmersBartle. [facility] is in same building. Contact information: [address] [place] [address] [phone]        Nona DellSamuel McDowell, MD Follow up.   Specialty:  Cardiology Why:  2 week appointment with cardiology. Contact information: [address] [place] [address] [phone]  The patient has been discharged on:   1.Beta Blocker:  Yes [   ]                              No   [n   ]                              If No, reason:bradycardia and BBB  2.Ace Inhibitor/ARB: Yes [ y  ]                                     No  [    ]                                     If No, reason:  3.Statin:   Yes [ y  ]                  No  [   ]                  If No, reason:  4.Ecasa:  Yes  [ y  ]                  No   [   ]                  If No, reason:   Signed: GOLD,WAYNE E 05/16/2016, 2:35 PM

## 2016-05-12 NOTE — Discharge Instructions (Signed)
Endoscopic Saphenous Vein Harvesting, Care After °Introduction °Refer to this sheet in the next few weeks. These instructions provide you with information about caring for yourself after your procedure. Your health care provider may also give you more specific instructions. Your treatment has been planned according to current medical practices, but problems sometimes occur. Call your health care provider if you have any problems or questions after your procedure. °What can I expect after the procedure? °After the procedure, it is common to have: °· Pain. °· Bruising. °· Swelling. °· Numbness. °Follow these instructions at home: °Medicine °· Take over-the-counter and prescription medicines only as told by your health care provider. °· Do not drive or operate heavy machinery while taking prescription pain medicine. °Incision care °· Follow instructions from your health care provider about how to take care of the cut made during surgery (incision). Make sure you: °¨ Wash your hands with soap and water before you change your bandage (dressing). If soap and water are not available, use hand sanitizer. °¨ Change your dressing as told by your health care provider. °¨ Leave stitches (sutures), skin glue, or adhesive strips in place. These skin closures may need to be in place for 2 weeks or longer. If adhesive strip edges start to loosen and curl up, you may trim the loose edges. Do not remove adhesive strips completely unless your health care provider tells you to do that. °· Check your incision area every day for signs of infection. Check for: °¨ More redness, swelling, or pain. °¨ More fluid or blood. °¨ Warmth. °¨ Pus or a bad smell. °General instructions °· Raise (elevate) your legs above the level of your heart while you are sitting or lying down. °· Do any exercises your health care providers have given you. These may include deep breathing, coughing, and walking exercises. °· Do not shower, take baths, swim, or use  a hot tub unless told by your health care provider. °· Wear your elastic stocking if told by your health care provider. °· Keep all follow-up visits as told by your health care provider. This is important. °Contact a health care provider if: °· Medicine does not help your pain. °· Your pain gets worse. °· You have new leg bruises or your leg bruises get bigger. °· You have a fever. °· Your leg feels numb. °· You have more redness, swelling, or pain around your incision. °· You have more fluid or blood coming from your incision. °· Your incision feels warm to the touch. °· You have pus or a bad smell coming from your incision. °Get help right away if: °· Your pain is severe. °· You develop pain, tenderness, warmth, redness, or swelling in any part of your leg. °· You have chest pain. °· You have trouble breathing. °This information is not intended to replace advice given to you by your health care provider. Make sure you discuss any questions you have with your health care provider. °Document Released: 02/08/2011 Document Revised: 11/04/2015 Document Reviewed: 04/12/2015 °© 2017 Elsevier °Coronary Artery Bypass Grafting, Care After °These instructions give you information on caring for yourself after your procedure. Your doctor may also give you more specific instructions. Call your doctor if you have any problems or questions after your procedure. °Follow these instructions at home: °· Only take medicine as told by your doctor. Take medicines exactly as told. Do not stop taking medicines or start any new medicines without talking to your doctor first. °· Take your pulse as told   by your doctor. °· Do deep breathing as told by your doctor. Use your breathing device (incentive spirometer), if given, to practice deep breathing several times a day. Support your chest with a pillow or your arms when you take deep breaths or cough. °· Keep the area clean, dry, and protected where the surgery cuts (incisions) were made.  Remove bandages (dressings) only as told by your doctor. If strips were applied to surgical area, do not take them off. They fall off on their own. °· Check the surgery area daily for puffiness (swelling), redness, or leaking fluid. °· If surgery cuts were made in your legs: °¨ Avoid crossing your legs. °¨ Avoid sitting for long periods of time. Change positions every 30 minutes. °¨ Raise your legs when you are sitting. Place them on pillows. °· Wear stockings that help keep blood clots from forming in your legs (compression stockings). °· Only take sponge baths until your doctor says it is okay to take showers. Pat the surgery area dry. Do not rub the surgery area with a washcloth or towel. Do not bathe, swim, or use a hot tub until your doctor says it is okay. °· Eat foods that are high in fiber. These include raw fruits and vegetables, whole grains, beans, and nuts. Choose lean meats. Avoid canned, processed, and fried foods. °· Drink enough fluids to keep your pee (urine) clear or pale yellow. °· Weigh yourself every day. °· Rest and limit activity as told by your doctor. You may be told to: °¨ Stop any activity if you have chest pain, shortness of breath, changes in heartbeat, or dizziness. Get help right away if this happens. °¨ Move around often for short amounts of time or take short walks as told by your doctor. Gradually become more active. You may need help to strengthen your muscles and build endurance. °¨ Avoid lifting, pushing, or pulling anything heavier than 10 pounds (4.5 kg) for at least 6 weeks after surgery. °· Do not drive until your doctor says it is okay. °· Ask your doctor when you can go back to work. °· Ask your doctor when you can begin sexual activity again. °· Follow up with your doctor as told. °Contact a doctor if: °· You have puffiness, redness, more pain, or fluid draining from the incision site. °· You have a fever. °· You have puffiness in your ankles or legs. °· You have pain in  your legs. °· You gain 2 or more pounds (0.9 kg) a day. °· You feel sick to your stomach (nauseous) or throw up (vomit). °· You have watery poop (diarrhea). °Get help right away if: °· You have chest pain that goes to your jaw or arms. °· You have shortness of breath. °· You have a fast or irregular heartbeat. °· You notice a "clicking" in your breastbone when you move. °· You have numbness or weakness in your arms or legs. °· You feel dizzy or light-headed. °This information is not intended to replace advice given to you by your health care provider. Make sure you discuss any questions you have with your health care provider. °Document Released: 06/03/2013 Document Revised: 11/04/2015 Document Reviewed: 11/05/2012 °Elsevier Interactive Patient Education © 2017 Elsevier Inc. ° °

## 2016-05-12 NOTE — Care Management Note (Signed)
Case Management Note  Patient Details  Name: Allen PuttSamuel J Westergren MRN: [id number] Date of Birth: 11/10/1933  Subjective/Objective:    Pt lives with wife, states she keeps their grandchildren and will be able to provide 24/7 assistance when he is medically stable for discharge.  Pt reports he has cane and walker that belonged to another family member if he has any need for same.              Expected Discharge Plan:  Home/Self Care  Discharge planning Services  CM Consult  Status of Service:  In process, will continue to follow  Magdalene RiverMayo, Lidiya Reise T, RN 05/12/2016, 10:06 AM

## 2016-05-13 LAB — GLUCOSE, CAPILLARY
GLUCOSE-CAPILLARY: 92 mg/dL (ref 65–99)
GLUCOSE-CAPILLARY: 125 mg/dL — AB (ref 65–99)
Glucose-Capillary: 149 mg/dL — ABNORMAL HIGH (ref 65–99)
Glucose-Capillary: 134 mg/dL — ABNORMAL HIGH (ref 65–99)

## 2016-05-13 LAB — BASIC METABOLIC PANEL
Anion gap: 7 (ref 5–15)
BUN: 22 mg/dL — AB (ref 6–20)
CHLORIDE: 104 mmol/L (ref 101–111)
CO2: 28 mmol/L (ref 22–32)
CREATININE: 1.5 mg/dL — AB (ref 0.61–1.24)
Calcium: 10.3 mg/dL (ref 8.9–10.3)
GFR calc Af Amer: 49 mL/min — ABNORMAL LOW (ref >60)
GFR calc non Af Amer: 42 mL/min — ABNORMAL LOW (ref >60)
GLUCOSE: 136 mg/dL — AB (ref 65–99)
POTASSIUM: 4.5 mmol/L (ref 3.5–5.1)
Sodium: 139 mmol/L (ref 135–145)

## 2016-05-13 LAB — URINE CULTURE: Culture: NO GROWTH

## 2016-05-13 MED ORDER — SODIUM CHLORIDE 0.9% FLUSH
10.0000 mL | INTRAVENOUS | Status: DC | PRN
  Administered 2016-05-16: 10 mL
  Filled 2016-05-13: qty 40

## 2016-05-13 MED ORDER — LISINOPRIL 5 MG PO TABS
5.0000 mg | ORAL_TABLET | Freq: Every day | ORAL | Status: DC
  Administered 2016-05-13 – 2016-05-16 (×4): 5 mg via ORAL
  Filled 2016-05-13 (×4): qty 1

## 2016-05-13 NOTE — Progress Notes (Signed)
Ambulated in hallway with rolling walker and no oxygen approximately 200 feet. Pt resting with call bell within reach.  Will continue to monitor.

## 2016-05-13 NOTE — Progress Notes (Addendum)
      [address]       [place] [id number]             [phone]      5 Days Post-Op Procedure(s) (LRB): CORONARY ARTERY BYPASS GRAFTING (CABG) x 5 (N/A) TRANSESOPHAGEAL ECHOCARDIOGRAM (TEE) (N/A) ENDOVEIN HARVEST OF GREATER SAPHENOUS VEIN Subjective: Sitting up eating his breakfast. Shares he has limited pain over his chest incision.   Objective: Vital signs in last 24 hours: Temp:  [98.5 F (36.9 C)-99.5 F (37.5 C)] 98.7 F (37.1 C) (12/02 0537) Pulse Rate:  [64-79] 77 (12/02 0537) Cardiac Rhythm: Heart block;Bundle branch block (12/01 1900) Resp:  [14-19] 18 (12/02 0537) BP: (119-150)/(54-90) 145/84 (12/02 0537) SpO2:  [91 %-100 %] 96 % (12/02 0537) Weight:  [173 lb 4.8 oz (78.6 kg)] 173 lb 4.8 oz (78.6 kg) (12/02 0537)      Intake/Output from previous day: 12/01 0701 - 12/02 0700 In: 301.5 [P.O.:240; I.V.:61.5] Out: 960 [Urine:960] Intake/Output this shift: No intake/output data recorded.  General appearance: alert, cooperative and no distress Heart: regular rate and rhythm Lungs: clear to auscultation bilaterally Abdomen: soft, non-tender; bowel sounds normal; no masses,  no organomegaly Extremities: extremities normal, atraumatic, no cyanosis or edema Wound: clean and dry, no drainage  Lab Results:  Recent Labs  05/11/16 0300 05/12/16 0445  WBC 8.1 7.2  HGB 9.1* 9.4*  HCT 28.9* 29.2*  PLT 141* 163   BMET:  Recent Labs  05/12/16 0445 05/13/16 0236  NA 138 139  K 4.8 4.5  CL 104 104  CO2 27 28  GLUCOSE 97 136*  BUN 25* 22*  CREATININE 1.32* 1.50*  CALCIUM 10.1 10.3    PT/INR: No results for input(s): LABPROT, INR in the last 72 hours. ABG    Component Value Date/Time   PHART 7.363 05/09/2016 0305   HCO3 22.4 05/09/2016 0305   TCO2 23 05/09/2016 1649   ACIDBASEDEF 3.0 (H) 05/09/2016 0305   O2SAT 57.7 05/12/2016 0445   CBG (last 3)   Recent Labs  05/12/16 1124 05/12/16 2148 05/13/16 0627  GLUCAP 136* 110*  149*    Assessment/Plan: S/P Procedure(s) (LRB): CORONARY ARTERY BYPASS GRAFTING (CABG) x 5 (N/A) TRANSESOPHAGEAL ECHOCARDIOGRAM (TEE) (N/A) ENDOVEIN HARVEST OF GREATER SAPHENOUS VEIN  1. CV-hx of bradycardia. NSR 70s with BBB, freq PVCs, will hold off on beta blocker. BP climbing. Will hold off for now on ACEI due to renal insuff. Dopamine just discontinued yesterday 2. Pulm-good oxygen saturation on room air.  3. Renal-1.5 creatinine today. Looks like his baseline was 1.4. Weight is trending down. Urine output okay.  4. ABL anemia- H and H remains stable 5. Endo-blood glucose level well controlled on current regimen 6. On Plavix 7. ID- urine culture pending. Patient had some burning with urination yesterday.   Plan: Continue wires, on backup rate of 60bpm. May be okay to start ACEI since renal function appears to be back to baseline. Ambulate TID. Continue to monitor rhythm closely.    LOS: 10 days    Sharlene Doryessa N Conte 05/13/2016 Patient seen and examined, agree with above Repeat BMET in AM  Amena Dockham C. Dorris FetchHendrickson, MD [facility] [phone]

## 2016-05-13 NOTE — Progress Notes (Signed)
CARDIAC REHAB PHASE I   PRE:  Rate/Rhythm: Sinus 72  BP:    Sitting: 120/70     SaO2: 97% room air  MODE:  Ambulation: 480 ft   POST:  Rate/Rhythem: 71  BP:  Supine:150/74  Sitting: 129/70     SaO2: 98% room air 1335-1415 Patient ambulated in hallway using rollator without difficulty. Gait steady. External pacer intact. Patient assisted back to bed with call light within reach.   Thayer HeadingsMaria Walden Osric Klopf RN BSN

## 2016-05-14 LAB — GLUCOSE, CAPILLARY
GLUCOSE-CAPILLARY: 189 mg/dL — AB (ref 65–99)
GLUCOSE-CAPILLARY: 127 mg/dL — AB (ref 65–99)
Glucose-Capillary: 114 mg/dL — ABNORMAL HIGH (ref 65–99)
Glucose-Capillary: 157 mg/dL — ABNORMAL HIGH (ref 65–99)

## 2016-05-14 LAB — BASIC METABOLIC PANEL
Anion gap: 9 (ref 5–15)
BUN: 20 mg/dL (ref 6–20)
CALCIUM: 10.4 mg/dL — AB (ref 8.9–10.3)
CHLORIDE: 105 mmol/L (ref 101–111)
CO2: 26 mmol/L (ref 22–32)
CREATININE: 1.39 mg/dL — AB (ref 0.61–1.24)
GFR calc non Af Amer: 46 mL/min — ABNORMAL LOW (ref >60)
GFR, EST AFRICAN AMERICAN: 54 mL/min — AB (ref >60)
GLUCOSE: 126 mg/dL — AB (ref 65–99)
Potassium: 4.5 mmol/L (ref 3.5–5.1)
Sodium: 140 mmol/L (ref 135–145)

## 2016-05-14 MED ORDER — POLYETHYLENE GLYCOL 3350 17 G PO PACK
17.0000 g | PACK | Freq: Every day | ORAL | Status: DC | PRN
  Administered 2016-05-14: 17 g via ORAL
  Filled 2016-05-14: qty 1

## 2016-05-14 NOTE — Progress Notes (Signed)
Pt ambulated 5550ft. Pt tolerated it well. Will continue to monitor.

## 2016-05-14 NOTE — Progress Notes (Signed)
Pt had a 11 beat run of vtach. Pt was ambulating in room and stated he felt fine. MD made aware. No orders placed. Will continue to monitor pt.

## 2016-05-14 NOTE — Progress Notes (Addendum)
      [address]       [place] [id number]             [phone]      6 Days Post-Op Procedure(s) (LRB): CORONARY ARTERY BYPASS GRAFTING (CABG) x 5 (N/A) TRANSESOPHAGEAL ECHOCARDIOGRAM (TEE) (N/A) ENDOVEIN HARVEST OF GREATER SAPHENOUS VEIN Subjective: Feels okay today. Walked twice yesterday.  Objective: Vital signs in last 24 hours: Temp:  [97.8 F (36.6 C)-99 F (37.2 C)] 98.5 F (36.9 C) (12/03 0500) Pulse Rate:  [71-94] 94 (12/03 0500) Cardiac Rhythm: Normal sinus rhythm;Bundle branch block;Other (Comment) (12/02 1900) Resp:  [18] 18 (12/03 0500) BP: (108-150)/(56-77) 125/77 (12/03 0500) SpO2:  [94 %-98 %] 94 % (12/03 0500) Weight:  [174 lb 1.6 oz (79 kg)] 174 lb 1.6 oz (79 kg) (12/03 0500)     Intake/Output from previous day: 12/02 0701 - 12/03 0700 In: -  Out: 570 [Urine:570] Intake/Output this shift: No intake/output data recorded.  General appearance: alert, cooperative and no distress Heart: NSR, paced Lungs: clear to auscultation bilaterally Abdomen: soft, non-tender; bowel sounds normal; no masses,  no organomegaly Extremities: extremities normal, atraumatic, no cyanosis or edema Wound: clean and dry  Lab Results:  Recent Labs  05/12/16 0445  WBC 7.2  HGB 9.4*  HCT 29.2*  PLT 163   BMET:  Recent Labs  05/13/16 0236 05/14/16 0338  NA 139 140  K 4.5 4.5  CL 104 105  CO2 28 26  GLUCOSE 136* 126*  BUN 22* 20  CREATININE 1.50* 1.39*  CALCIUM 10.3 10.4*    PT/INR: No results for input(s): LABPROT, INR in the last 72 hours. ABG    Component Value Date/Time   PHART 7.363 05/09/2016 0305   HCO3 22.4 05/09/2016 0305   TCO2 23 05/09/2016 1649   ACIDBASEDEF 3.0 (H) 05/09/2016 0305   O2SAT 57.7 05/12/2016 0445   CBG (last 3)   Recent Labs  05/13/16 1609 05/13/16 2159 05/14/16 0617  GLUCAP 92 125* 114*    Assessment/Plan: S/P Procedure(s) (LRB): CORONARY ARTERY BYPASS GRAFTING (CABG) x 5 (N/A) TRANSESOPHAGEAL  ECHOCARDIOGRAM (TEE) (N/A) ENDOVEIN HARVEST OF GREATER SAPHENOUS VEIN   1. CV-hx of bradycardia.Paced at 60. No BB. BP improved on ACEI. Keep pacing wires as in use.  2. Pulm-good oxygen saturation on room air.  3. Renal-1.39 creatinine today. Looks like his baseline was 1.4. Weight is stable. Urine output okay.  4. ABL anemia- H and H remains stable 5. Endo-blood glucose level well controlled on current regimen 6. On Plavix 7. ID- urine culture pending. Patient had some burning with urination yesterday.   Plan: Continue pacing wires and monitor rhythm closely. May need a permanent device. Creatinine improved. BP improved.    LOS: 11 days    Sharlene Doryessa N Conte 05/14/2016 Patient seen and examined, agree with above Creatinine stable Did well with walk today Pacing intermittently- turned pacer down to 54 to see how low his rate is actually getting  Viviann SpareSteven C. Dorris FetchHendrickson, MD [facility] [phone]

## 2016-05-15 LAB — GLUCOSE, CAPILLARY
GLUCOSE-CAPILLARY: 111 mg/dL — AB (ref 65–99)
GLUCOSE-CAPILLARY: 104 mg/dL — AB (ref 65–99)
Glucose-Capillary: 83 mg/dL (ref 65–99)
Glucose-Capillary: 105 mg/dL — ABNORMAL HIGH (ref 65–99)

## 2016-05-15 MED ORDER — GLIMEPIRIDE 4 MG PO TABS
2.0000 mg | ORAL_TABLET | Freq: Two times a day (BID) | ORAL | Status: DC
  Administered 2016-05-15 – 2016-05-16 (×3): 2 mg via ORAL
  Filled 2016-05-15 (×3): qty 1

## 2016-05-15 NOTE — Progress Notes (Signed)
CARDIAC REHAB PHASE I   PRE:  Rate/Rhythm: 78 SR  BP:  Supine: 112/75  Sitting:   Standing:    SaO2: 96%RA  MODE:  Ambulation: 700 ft   POST:  Rate/Rhythm: 84 SR  BP:  Supine:   Sitting: 160/64  Standing:    SaO2: 99%RA 0935-1000 Pt walked 700 ft on RA with rolling walker and minimal asst. Probably does not need walker. Tolerated well. Back to bed. Pacer intact.    Luetta Nuttingharlene Pelham Hennick, RN BSN  05/15/2016 9:56 AM

## 2016-05-15 NOTE — Progress Notes (Addendum)
      [address]       [place] [id number]             [phone]      7 Days Post-Op Procedure(s) (LRB): CORONARY ARTERY BYPASS GRAFTING (CABG) x 5 (N/A) TRANSESOPHAGEAL ECHOCARDIOGRAM (TEE) (N/A) ENDOVEIN HARVEST OF GREATER SAPHENOUS VEIN Subjective: Feeling pretty well  Objective: Vital signs in last 24 hours: Temp:  [98.3 F (36.8 C)-99.1 F (37.3 C)] 99.1 F (37.3 C) (12/04 0532) Pulse Rate:  [71-74] 74 (12/04 0532) Cardiac Rhythm: Normal sinus rhythm (12/03 1900) Resp:  [18] 18 (12/03 2116) BP: (103-116)/(63-72) 103/63 (12/04 0532) SpO2:  [95 %-96 %] 96 % (12/04 0532) Weight:  [171 lb 12.8 oz (77.9 kg)] 171 lb 12.8 oz (77.9 kg) (12/04 0532)  Hemodynamic parameters for last 24 hours:    Intake/Output from previous day: 12/03 0701 - 12/04 0700 In: 720 [P.O.:720] Out: 550 [Urine:550] Intake/Output this shift: No intake/output data recorded.  General appearance: alert, cooperative and no distress Heart: regular rate and rhythm and 2/6 systolic murmur Lungs: mildly dim in bases Abdomen: benign Extremities: no edema Wound: incis healing well  Lab Results: No results for input(s): WBC, HGB, HCT, PLT in the last 72 hours. BMET:  Recent Labs  05/13/16 0236 05/14/16 0338  NA 139 140  K 4.5 4.5  CL 104 105  CO2 28 26  GLUCOSE 136* 126*  BUN 22* 20  CREATININE 1.50* 1.39*  CALCIUM 10.3 10.4*    PT/INR: No results for input(s): LABPROT, INR in the last 72 hours. ABG    Component Value Date/Time   PHART 7.363 05/09/2016 0305   HCO3 22.4 05/09/2016 0305   TCO2 23 05/09/2016 1649   ACIDBASEDEF 3.0 (H) 05/09/2016 0305   O2SAT 57.7 05/12/2016 0445   CBG (last 3)   Recent Labs  05/14/16 1643 05/14/16 2114 05/15/16 0539  GLUCAP 157* 127* 111*    Meds Scheduled Meds: . aspirin EC  81 mg Oral Daily  . atorvastatin  80 mg Oral q1800  . clopidogrel  75 mg Oral Daily  . famotidine  20 mg Oral BID  . insulin aspart  0-24 Units  Subcutaneous TID AC & HS  . insulin detemir  20 Units Subcutaneous Daily  . lisinopril  5 mg Oral Daily  . mouth rinse  15 mL Mouth Rinse BID  . sodium chloride flush  3 mL Intravenous Q12H   Continuous Infusions: PRN Meds:.sodium chloride, acetaminophen, ondansetron **OR** ondansetron (ZOFRAN) IV, oxyCODONE, polyethylene glycol, sodium chloride flush, sodium chloride flush, traMADol  Xrays No results found.  Assessment/Plan: S/P Procedure(s) (LRB): CORONARY ARTERY BYPASS GRAFTING (CABG) x 5 (N/A) TRANSESOPHAGEAL ECHOCARDIOGRAM (TEE) (N/A) ENDOVEIN HARVEST OF GREATER SAPHENOUS VEIN   1 doing well 2 HR in 60-70's, sinus with pvc-s- will disconnect from pacer, poss d/c wires 3 no new labs 4 adeq sugar control , was on insulin and amaryl/metformin  at home- restart amaryl , no metformin for now with renal insuff. - needs low carb intake 5 push rehab/pulm toilet as able 6 urine cx neg  LOS: 12 days    [name],[name] 05/15/2016   Chart reviewed, patient examined, agree with above. He is doing well overall. Will remove pacer and check ECG in am. If no significant bradycardia overnight will remove pacer wires and let him go home. Discussed with him and his niece Allen [name] by telephone.

## 2016-05-15 NOTE — Care Management Important Message (Signed)
Important Message  Patient Details  Name: Allen [name] MRN: [id number] Date of Birth: 10/21/1933   Medicare Important Message Given:  Yes    Wilkins Elpers Abena 05/15/2016, 11:15 AM

## 2016-05-15 NOTE — Consult Note (Signed)
[facility] CM Primary Care Navigator  05/15/2016  Allen [name] 08/10/1933 [id number]  Met with patient at the bedside to identify possible discharge needs. Patient confirms Dr. Stoney [name] with [facility] as his primary care provider.    Patient states using [facility] pharmacy in [place] and [facility] to obtain medications without any problem.   Patient's niece Allen Booty B.) manages medications for him using "pill box" system.   Patient reports being able to drive prior to admission. His niece provides transportation at times as stated. She will be able to transport him to doctors' appointments when needed per patient.  Wife (Allen [name]) will be the primary caregiver at home.   Discharge plan is home with wife's assistance per patient.  Patient voiced understanding to call primary care provider's office when he gets home, for a post discharge follow-up appointment within a week or sooner if needs arise. Patient letter provided as a reminder. Patient states that he will be dropping by PCP's office on his way home and will set-up an appointment.  He denies further needs or concerns at this time.  For additional questions please contact:  Allen [name], BSN, RN-BC [facility] PRIMARY CARE Navigator Cell: [phone]

## 2016-05-16 LAB — GLUCOSE, CAPILLARY
GLUCOSE-CAPILLARY: 175 mg/dL — AB (ref 65–99)
Glucose-Capillary: 91 mg/dL (ref 65–99)
Glucose-Capillary: 113 mg/dL — ABNORMAL HIGH (ref 65–99)

## 2016-05-16 MED ORDER — LACTULOSE 10 GM/15ML PO SOLN
20.0000 g | Freq: Every day | ORAL | Status: DC | PRN
  Administered 2016-05-16: 20 g via ORAL
  Filled 2016-05-16: qty 30

## 2016-05-16 MED ORDER — LISINOPRIL 5 MG PO TABS: 5.0000 mg | ORAL_TABLET | Freq: Every day | ORAL | 1 refills | Status: DC

## 2016-05-16 MED ORDER — INSULIN GLARGINE 100 UNIT/ML SOLOSTAR PEN: 20.0000 [IU] | PEN_INJECTOR | Freq: Every day | SUBCUTANEOUS | 11 refills | Status: AC

## 2016-05-16 MED ORDER — OXYCODONE HCL 5 MG PO TABS: 5.0000 mg | ORAL_TABLET | Freq: Four times a day (QID) | ORAL | 0 refills | Status: DC | PRN

## 2016-05-16 NOTE — Progress Notes (Signed)
Pt/family given discharge instructions, medication lists, follow up appointments, and when to call the doctor.  Pt/family verbalizes understanding. Rivky Clendenning McClintock, RN   

## 2016-05-16 NOTE — Care Management Note (Signed)
Case Management Note Previous CM note initiated by Magdalene RiverMayo, [name], RN 05/12/2016, 10:06 AM   Patient Details  Name: Allen [name] MRN: [id number] Date of Birth: 10/04/1933  Subjective/Objective:    Pt lives with wife, states she keeps their grandchildren and will be able to provide 24/7 assistance when he is medically stable for discharge.  Pt reports he has cane and walker that belonged to another family member if he has any need for same.                Action/Plan: Pt tx from ICU to 2W- plan to return home- no CM needs noted for discharge.   Expected Discharge Date:     05/16/16             Expected Discharge Plan:  Home/Self Care  In-House Referral:     Discharge planning Services  CM Consult  Post Acute Care Choice:    Choice offered to:     DME Arranged:    DME Agency:     HH Arranged:    HH Agency:     Status of Service:  Completed, signed off  If discussed at [employer] Length of Stay Meetings, dates discussed:    Additional Comments:  Darrold SpanWebster, Tamalyn Wadsworth Hall, RN 05/16/2016, 3:19 PM [phone]

## 2016-05-16 NOTE — Progress Notes (Signed)
Patient ID: Allen [name], male   DOB: 07/13/1933, 82 y.o.   MRN: [id number]   Patient Name: Allen [name] Date of Encounter: 05/16/2016  [facility]  Hospital Problem List     Principal Problem:   NSTEMI (non-ST elevated myocardial infarction) [facility](HCC) Active Problems:   Hypertension   Hyperlipidemia   Chronic kidney disease   LBBB (left bundle branch block)   Ischemic cardiomyopathy   S/P CABG x 5     Subjective   No complaints wants to go home ambulates some constipation but just went To bathroom. Telemetry with PVC;s no long runs NSVT EF 40-45% post CABG No palpitations or syncope   Inpatient Medications    Scheduled Meds: . aspirin EC  81 mg Oral Daily  . atorvastatin  80 mg Oral q1800  . clopidogrel  75 mg Oral Daily  . famotidine  20 mg Oral BID  . glimepiride  2 mg Oral BID WC  . insulin aspart  0-24 Units Subcutaneous TID AC & HS  . insulin detemir  20 Units Subcutaneous Daily  . lisinopril  5 mg Oral Daily  . mouth rinse  15 mL Mouth Rinse BID  . sodium chloride flush  3 mL Intravenous Q12H   Continuous Infusions:  PRN Meds: sodium chloride, acetaminophen, lactulose, ondansetron **OR** ondansetron (ZOFRAN) IV, oxyCODONE, polyethylene glycol, sodium chloride flush, sodium chloride flush, traMADol   Vital Signs    Vitals:   05/16/16 1130 05/16/16 1145 05/16/16 1200 05/16/16 1213  BP: 121/66 114/81 116/83 130/70  Pulse:  69 67 69  Resp:      Temp:      TempSrc:      SpO2:      Weight:      Height:        Intake/Output Summary (Last 24 hours) at 05/16/16 1410 Last data filed at 05/16/16 0800  Gross per 24 hour  Intake              240 ml  Output              650 ml  Net             -410 ml   Filed Weights   05/14/16 0500 05/15/16 0532 05/16/16 0515  Weight: 79 kg (174 lb 1.6 oz) 77.9 kg (171 lb 12.8 oz) 76.2 kg (168 lb 1.6 oz)    Physical Exam    GEN: Well nourished, well developed black male , in no acute distress.    HEENT: Grossly normal.  Neck: Supple, no JVD, carotid bruits, or masses. Cardiac: RRR, no murmurs, rubs, or gallops. Post sternotomy  Respiratory:  Respirations regular and unlabored, clear to auscultation bilaterally. GI: Soft, nontender, nondistended, BS + x 4. MS: no deformity or atrophy. Skin: warm and dry, no rash. Neuro:  Strength and sensation are intact. Psych: AAOx3.  Normal affect.  Labs    CBC No results for input(s): WBC, NEUTROABS, HGB, HCT, MCV, PLT in the last 72 hours. Basic Metabolic Panel  Recent Labs  05/14/16 0338  NA 140  K 4.5  CL 105  CO2 26  GLUCOSE 126*  BUN 20  CREATININE 1.39*  CALCIUM 10.4*     Telemetry    SR no AV block chronic LBBB PVC;s no significant NSVT - Personally Reviewed  ECG    SR LBBB PVC normal QT  - Personally Reviewed  Radiology    No results found.  Cardiac Studies   Echo EF  40-45%   Patient Profile     82 y.o. 8 days post CABG   Assessment & Plan    PVC;s:  Asymptomatic EF not in dangerous range chronic LBBB can d/c home Consider starting outpatient beta blocker on office visit. No need for amiodarone Appears euvolemic. LBBB is old no AV block Outpatient f/[facility]   Signed, Charlton HawsPeter Calden Dorsey, MD  05/16/2016, 2:10 PM

## 2016-05-16 NOTE — Progress Notes (Signed)
Removed epicardial wires per order. 4 intact.  Pt tolerated procedure well.  Pt instructed to remain on bedrest for one hour.  Frequent vitals will be taken and documented. Pt resting with call bell within reach. ° °

## 2016-05-16 NOTE — Progress Notes (Signed)
CARDIAC REHAB PHASE I   Pt has ambulated independently, now on bedrest post EPW removal. Pt states he is planning on going home today. Cardiac surgery discharge education completed. Reviewed IS, sternal precautions, activity progression, exercise, heart healthy diet, carb counting, and phase 2 cardiac rehab. Pt verbalized understanding. Pt agrees to phase 2 cardiac rehab referral, [facility]. Pt in bed, call bell within reach.   [phone] Joylene GrapesEmily C Sylvia Kondracki, RN, BSN 05/16/2016 11:39 AM

## 2016-05-16 NOTE — Progress Notes (Signed)
      [address]       [place] [id number]             [phone]      8 Days Post-Op Procedure(s) (LRB): CORONARY ARTERY BYPASS GRAFTING (CABG) x 5 (N/A) TRANSESOPHAGEAL ECHOCARDIOGRAM (TEE) (N/A) ENDOVEIN HARVEST OF GREATER SAPHENOUS VEIN Subjective: Feels well  Objective: Vital signs in last 24 hours: Temp:  [97.9 F (36.6 C)-99.3 F (37.4 C)] 98.1 F (36.7 C) (12/05 0515) Pulse Rate:  [70-75] 70 (12/05 0515) Cardiac Rhythm: Normal sinus rhythm;Bundle branch block (12/04 1900) Resp:  [18-20] 20 (12/05 0515) BP: (118-137)/(57-85) 132/80 (12/05 0515) SpO2:  [95 %-97 %] 95 % (12/05 0515) Weight:  [168 lb 1.6 oz (76.2 kg)] 168 lb 1.6 oz (76.2 kg) (12/05 0515)  Hemodynamic parameters for last 24 hours:    Intake/Output from previous day: 12/04 0701 - 12/05 0700 In: 480 [P.O.:480] Out: 400 [Urine:400] Intake/Output this shift: No intake/output data recorded.  General appearance: alert, cooperative and no distress Heart: regular rate and rhythm and 2/6 systolic aortic murmur Lungs: clear to auscultation bilaterally Abdomen: soft, non-tender Extremities: no edema Wound: incis healing well  Lab Results: No results for input(s): WBC, HGB, HCT, PLT in the last 72 hours. BMET:  Recent Labs  05/14/16 0338  NA 140  K 4.5  CL 105  CO2 26  GLUCOSE 126*  BUN 20  CREATININE 1.39*  CALCIUM 10.4*    PT/INR: No results for input(s): LABPROT, INR in the last 72 hours. ABG    Component Value Date/Time   PHART 7.363 05/09/2016 0305   HCO3 22.4 05/09/2016 0305   TCO2 23 05/09/2016 1649   ACIDBASEDEF 3.0 (H) 05/09/2016 0305   O2SAT 57.7 05/12/2016 0445   CBG (last 3)   Recent Labs  05/15/16 1711 05/15/16 2126 05/16/16 0628  GLUCAP 83 105* 91    Meds Scheduled Meds: . aspirin EC  81 mg Oral Daily  . atorvastatin  80 mg Oral q1800  . clopidogrel  75 mg Oral Daily  . famotidine  20 mg Oral BID  . glimepiride  2 mg Oral BID WC  . insulin  aspart  0-24 Units Subcutaneous TID AC & HS  . insulin detemir  20 Units Subcutaneous Daily  . lisinopril  5 mg Oral Daily  . mouth rinse  15 mL Mouth Rinse BID  . sodium chloride flush  3 mL Intravenous Q12H   Continuous Infusions: PRN Meds:.sodium chloride, acetaminophen, ondansetron **OR** ondansetron (ZOFRAN) IV, oxyCODONE, polyethylene glycol, sodium chloride flush, sodium chloride flush, traMADol  Xrays No results found.  Assessment/Plan: S/P Procedure(s) (LRB): CORONARY ARTERY BYPASS GRAFTING (CABG) x 5 (N/A) TRANSESOPHAGEAL ECHOCARDIOGRAM (TEE) (N/A) ENDOVEIN HARVEST OF GREATER SAPHENOUS VEIN  1 doing well 2 sinus arryhthmia, pvc's , BBB, HR 50's to 70's- EKG pending 3 BP controlled 4 cbg's controlled 5 no new labs 6 poss d/c epw's and discharge today 7 wants laxative- will order  LOS: 13 days    Jeralynn Vaquera E 05/16/2016

## 2016-05-17 ENCOUNTER — Other Ambulatory Visit: Payer: Medicare PPO

## 2016-05-19 ENCOUNTER — Encounter (INDEPENDENT_AMBULATORY_CARE_PROVIDER_SITE_OTHER): Payer: Self-pay

## 2016-05-19 DIAGNOSIS — Z4802 Encounter for removal of sutures: Secondary | ICD-10-CM

## 2016-05-19 DIAGNOSIS — Z951 Presence of aortocoronary bypass graft: Secondary | ICD-10-CM

## 2016-05-23 ENCOUNTER — Encounter (HOSPITAL_COMMUNITY): Payer: Self-pay | Admitting: Emergency Medicine

## 2016-05-23 ENCOUNTER — Emergency Department (HOSPITAL_COMMUNITY): Payer: Medicare PPO

## 2016-05-23 ENCOUNTER — Emergency Department (HOSPITAL_COMMUNITY)
Admission: EM | Admit: 2016-05-23 | Discharge: 2016-05-23 | Disposition: A | Discharge status: Home / Self Care | Payer: Medicare PPO | Attending: Emergency Medicine | Admitting: Emergency Medicine

## 2016-05-23 DIAGNOSIS — Z87891 Personal history of nicotine dependence: Secondary | ICD-10-CM | POA: Diagnosis not present

## 2016-05-23 DIAGNOSIS — I2510 Atherosclerotic heart disease of native coronary artery without angina pectoris: Secondary | ICD-10-CM | POA: Insufficient documentation

## 2016-05-23 DIAGNOSIS — I129 Hypertensive chronic kidney disease with stage 1 through stage 4 chronic kidney disease, or unspecified chronic kidney disease: Secondary | ICD-10-CM | POA: Insufficient documentation

## 2016-05-23 DIAGNOSIS — Z7982 Long term (current) use of aspirin: Secondary | ICD-10-CM | POA: Diagnosis not present

## 2016-05-23 DIAGNOSIS — Z955 Presence of coronary angioplasty implant and graft: Secondary | ICD-10-CM | POA: Diagnosis not present

## 2016-05-23 DIAGNOSIS — N189 Chronic kidney disease, unspecified: Secondary | ICD-10-CM | POA: Insufficient documentation

## 2016-05-23 DIAGNOSIS — R079 Chest pain, unspecified: Secondary | ICD-10-CM

## 2016-05-23 DIAGNOSIS — R0789 Other chest pain: Secondary | ICD-10-CM

## 2016-05-23 DIAGNOSIS — Z79899 Other long term (current) drug therapy: Secondary | ICD-10-CM | POA: Insufficient documentation

## 2016-05-23 DIAGNOSIS — R7989 Other specified abnormal findings of blood chemistry: Secondary | ICD-10-CM | POA: Diagnosis not present

## 2016-05-23 DIAGNOSIS — Z794 Long term (current) use of insulin: Secondary | ICD-10-CM | POA: Diagnosis not present

## 2016-05-23 DIAGNOSIS — R778 Other specified abnormalities of plasma proteins: Secondary | ICD-10-CM

## 2016-05-23 DIAGNOSIS — E1122 Type 2 diabetes mellitus with diabetic chronic kidney disease: Secondary | ICD-10-CM | POA: Diagnosis not present

## 2016-05-23 LAB — BASIC METABOLIC PANEL
ANION GAP: 7 (ref 5–15)
BUN: 22 mg/dL — AB (ref 6–20)
CALCIUM: 10.7 mg/dL — AB (ref 8.9–10.3)
CO2: 24 mmol/L (ref 22–32)
Chloride: 108 mmol/L (ref 101–111)
Creatinine, Ser: 1.44 mg/dL — ABNORMAL HIGH (ref 0.61–1.24)
GFR calc Af Amer: 51 mL/min — ABNORMAL LOW (ref >60)
GFR, EST NON AFRICAN AMERICAN: 44 mL/min — AB (ref >60)
GLUCOSE: 117 mg/dL — AB (ref 65–99)
Potassium: 4.9 mmol/L (ref 3.5–5.1)
Sodium: 139 mmol/L (ref 135–145)

## 2016-05-23 LAB — CBC
HCT: 36.8 % — ABNORMAL LOW (ref 39.0–52.0)
HEMOGLOBIN: 11.3 g/dL — AB (ref 13.0–17.0)
MCH: 28.3 pg (ref 26.0–34.0)
MCHC: 30.7 g/dL (ref 30.0–36.0)
MCV: 92.2 fL (ref 78.0–100.0)
Platelets: 594 10*3/uL — ABNORMAL HIGH (ref 150–400)
RBC: 3.99 MIL/uL — ABNORMAL LOW (ref 4.22–5.81)
RDW: 15.9 % — AB (ref 11.5–15.5)
WBC: 8.6 10*3/uL (ref 4.0–10.5)

## 2016-05-23 LAB — TROPONIN I
TROPONIN I: 0.58 ng/mL — AB (ref <0.03)
TROPONIN I: 0.56 ng/mL — AB (ref <0.03)

## 2016-05-23 NOTE — ED Triage Notes (Signed)
Pt reports had open heart surgery on 12/1. Pt reports woke up this am with chest pain. Pt reports took one nitro at home. Pt reports intermittent relief of pain. Pt currently reports 8/10 pain. Pt denies n/v. No dyspnea noted.

## 2016-05-23 NOTE — Discharge Instructions (Signed)
Cardiologist says OK to go home.  Follow-up your primary care doctor and/or cardiologist

## 2016-05-23 NOTE — ED Notes (Signed)
CRITICAL VALUE ALERT  Critical value received:  Troponin - 0.58  Date of notification:  05/23/2016 Time of notification:  0940  Critical value read back: yes  Nurse who received alert:  LJS  MD notified (1st page):  Dr Adriana Simasook  Time of first page:  0940   MD notified (2nd page):  Time of second page:  Responding MD:  Dr Adriana Simasook  Time MD responded:  [phone]

## 2016-05-23 NOTE — ED Provider Notes (Signed)
[facility]-EMERGENCY DEPT Provider Note   CSN: [id number] Arrival date & time: 05/23/16  [id number]   By signing my name below, I, Clovis PuAvnee Patel, attest that this documentation has been prepared under the direction and in the presence of Donnetta HutchingBrian Mehtaab Mayeda, MD  Electronically Signed: Clovis PuAvnee Patel, ED Scribe. 05/23/16. 9:22 AM.   History   Chief Complaint Chief Complaint  Patient presents with  . Chest Pain   The history is provided by the patient. No language interpreter was used.   HPI Comments:  Allen [name] is a 82 y.o. male, with a PMHx of DM, HTN, hyperlipidemia, CAD and a PSHx of a CABG on 05/12/16 (discharged ~ 1 week ago) who presents to the Emergency Department complaining of sudden onset, "bubble" sensation to his chest which began in the AM today. Pt states his pain lasted for 4 hours before it radiated to the back of his left shoulder. He took one nitro with no relief. Pt denies SOB, diaphoresis, vomiting, leg swelling, any other associated symptoms and modifying factors at this time.  Past Medical History:  Diagnosis Date  . CAD (coronary artery disease)    Multivessel obstructive CAD involving the RCA, circumflex, and obtuse marginal system with only mild LAD disease May 2016  . Diabetes mellitus without complication (HCC)   . Essential hypertension   . Hyperlipidemia   . Ischemic cardiomyopathy    LVEF 35% May 2016  . LBBB (left bundle branch block)   . Sinus bradycardia     Patient Active Problem List   Diagnosis Date Noted  . S/P CABG x 5 05/08/2016  . NSTEMI (non-ST elevated myocardial infarction) (HCC) 05/03/2016  . Ischemic cardiomyopathy 11/26/2014  . CAD (coronary artery disease), native coronary artery 11/24/2014  . LBBB (left bundle branch block) 10/23/2014  . Mitral regurgitation 09/19/2013  . Shortness of breath 08/29/2013  . Dizziness   . Aortic valve sclerosis   . Aortic insufficiency   . Left ventricular hypertrophy   . Ejection fraction   . Abnormal chest  x-ray   . Hypertension   . Hyperlipidemia   . Tobacco abuse   . Chronic kidney disease   . Sinus bradycardia   . Obstructive sleep apnea     Past Surgical History:  Procedure Laterality Date  . CARDIAC CATHETERIZATION N/A 11/02/2014   Procedure: Left Heart Cath and Coronary Angiography;  Surgeon: Lyn RecordsHenry W Smith, MD;  [facility];  Service: Cardiovascular;  Laterality: N/A;  . CARDIAC CATHETERIZATION N/A 05/03/2016   Procedure: Left Heart Cath and Coronary Angiography;  Surgeon: Peter M SwazilandJordan, MD;  [facility];  Service: Cardiovascular;  Laterality: N/A;  . CORONARY ARTERY BYPASS GRAFT N/A 05/08/2016   Procedure: CORONARY ARTERY BYPASS GRAFTING (CABG) x 5;  Surgeon: Alleen BorneBryan K Bartle, MD;  Location: [facility] OR;  Service: Open Heart Surgery;  Laterality: N/A;  . ENDOVEIN HARVEST OF GREATER SAPHENOUS VEIN  05/08/2016   Procedure: ENDOVEIN HARVEST OF GREATER SAPHENOUS VEIN;  Surgeon: Alleen BorneBryan K Bartle, MD;  Location: [facility] OR;  Service: Open Heart Surgery;;  . TEE WITHOUT CARDIOVERSION N/A 05/08/2016   Procedure: TRANSESOPHAGEAL ECHOCARDIOGRAM (TEE);  Surgeon: Alleen BorneBryan K Bartle, MD;  [facility];  Service: Open Heart Surgery;  Laterality: N/A;      Home Medications    Prior to Admission medications   Medication Sig Start Date End Date Taking? Authorizing Provider  amLODipine (NORVASC) 10 MG tablet TAKE 1 TABLET ONE TIME DAILY 11/01/15  Yes Illene BolusSamuel G  Diona BrownerMcDowell, MD  aspirin 81 MG EC tablet Take 81 mg by mouth daily.   Yes Historical Provider, MD  atorvastatin (LIPITOR) 80 MG tablet Take 1 tablet (80 mg total) by mouth daily at 6 PM. 11/26/14  Yes Luis AbedJeffrey D Katz, MD  clopidogrel (PLAVIX) 75 MG tablet TAKE 1 TABLET ONE TIME DAILY WITH BREAKFAST 11/01/15  Yes Jonelle SidleSamuel G [facility], MD  glimepiride (AMARYL) 2 MG tablet Take 2 mg by mouth 2 (two) times daily. 06/10/13  Yes Historical Provider, MD  Insulin Glargine (LANTUS SOLOSTAR) 100 UNIT/ML Solostar Pen Inject 20 Units into the  skin at bedtime. 05/16/16  Yes Wayne E Gold, PA-C  lisinopril (PRINIVIL,ZESTRIL) 5 MG tablet Take 1 tablet (5 mg total) by mouth daily. 05/17/16  Yes Wayne E Gold, PA-C  pantoprazole (PROTONIX) 20 MG tablet Take 1 tablet (20 mg total) by mouth daily. 05/01/16  Yes Jonelle SidleSamuel G [facility], MD  oxyCODONE (OXY IR/ROXICODONE) 5 MG immediate release tablet Take 1 tablet (5 mg total) by mouth every 6 (six) hours as needed for severe pain. 05/16/16   Rowe ClackWayne E Gold, PA-C    Family History Family History  Problem Relation Age of Onset  . Coronary artery disease Brother     Social History Social History  Substance Use Topics  . Smoking status: Former Smoker    Quit date: 06/13/2011  . Smokeless tobacco: Never Used  . Alcohol use No     Allergies   Sulfa antibiotics   Review of Systems Review of Systems  Constitutional: Negative for diaphoresis.  Respiratory: Negative for shortness of breath.   Cardiovascular: Positive for chest pain. Negative for leg swelling.  Gastrointestinal: Negative for vomiting.  All other systems reviewed and are negative.  Physical Exam Updated Vital Signs BP 115/80   Pulse 66   Temp 98 F (36.7 C) (Oral)   Resp 21   Ht 5\' 2"  (1.575 m)   Wt 168 lb (76.2 kg)   SpO2 99%   BMI 30.73 kg/m   Physical Exam  Constitutional: He is oriented to person, place, and time. He appears well-developed and well-nourished.  HENT:  Head: Normocephalic and atraumatic.  Eyes: Conjunctivae are normal.  Neck: Neck supple.  Cardiovascular: Normal rate and regular rhythm.   Healing vertical CABG scar  Pulmonary/Chest: Effort normal and breath sounds normal.  Abdominal: Soft. Bowel sounds are normal.  Musculoskeletal: Normal range of motion.  Neurological: He is alert and oriented to person, place, and time.  Skin: Skin is warm and dry.  Psychiatric: He has a normal mood and affect. His behavior is normal.  Nursing note and vitals reviewed.   ED Treatments / Results    DIAGNOSTIC STUDIES:  Oxygen Saturation is 99% on RA, normal by my interpretation.    COORDINATION OF CARE:  9:21 AM Will check troponin. Discussed treatment plan with pt at bedside and pt agreed to plan.  Labs (all labs ordered are listed, but only abnormal results are displayed) Labs Reviewed  BASIC METABOLIC PANEL - Abnormal; Notable for the following:       Result Value   Glucose, Bld 117 (*)    BUN 22 (*)    Creatinine, Ser 1.44 (*)    Calcium 10.7 (*)    GFR calc non Af Amer 44 (*)    GFR calc Af Amer 51 (*)    All other components within normal limits  CBC - Abnormal; Notable for the following:    RBC 3.99 (*)    Hemoglobin  11.3 (*)    HCT 36.8 (*)    RDW 15.9 (*)    Platelets 594 (*)    All other components within normal limits  TROPONIN I - Abnormal; Notable for the following:    Troponin I 0.58 (*)    All other components within normal limits  TROPONIN I - Abnormal; Notable for the following:    Troponin I 0.56 (*)    All other components within normal limits    EKG  EKG Interpretation  Date/Time:  Tuesday May 23 2016 09:01:48 EST Ventricular Rate:  67 PR Interval:    QRS Duration: 172 QT Interval:  413 QTC Calculation: 436 R Axis:   108 Text Interpretation:  Sinus rhythm Paired ventricular premature complexes Consider left ventricular hypertrophy Repol abnrm, global ischemia, diffuse leads Confirmed by Adriana Simas  MD, Mickle Campton ([id number]) on 05/23/2016 10:02:38 AM       Radiology Dg Chest 2 View  Result Date: 05/23/2016 CLINICAL DATA:  Chest pain for several hours, initial encounter EXAM: CHEST  2 VIEW COMPARISON:  05/10/2016 FINDINGS: Cardiac shadow remains enlarged. Postsurgical changes are again seen. Increased density is noted in the left lung base likely representing a combination of small effusion and atelectasis. No other focal infiltrate is noted. No bony abnormality is seen. IMPRESSION: Left basilar changes as described. Electronically Signed   By:  Alcide Clever M.D.   On: 05/23/2016 10:13    Procedures Procedures (including critical care time)  Medications Ordered in ED Medications - No data to display   Initial Impression / Assessment and Plan / ED Course  I have reviewed the triage vital signs and the nursing notes.  Pertinent labs & imaging results that were available during my care of the patient were reviewed by me and considered in my medical decision making (see chart for details).  Clinical Course     Patient presents with chest pain for 4 hours, now relieved. EKG shows T-wave inversion in inferior and lateral leads.  Troponin 0.58. Patient is hemodynamically stable. Will consult cardiology. 1220:  Discuss with cardiologist. Patient is stable. Second troponin declining. Suspect elevated troponin secondary to post surgical myocardial healing Final Clinical Impressions(s) / ED Diagnoses   Final diagnoses:  Chest pain, unspecified type  Elevated troponin    New Prescriptions New Prescriptions   No medications on file  I personally performed the services described in this documentation, which was scribed in my presence. The recorded information has been reviewed and is accurate.      Donnetta Hutching, MD 05/23/16 1245

## 2016-05-23 NOTE — Consult Note (Signed)
CARDIOLOGY CONSULT NOTE   Patient ID: Allen [name] MRN: [id number] DOB/AGE: 80/01/1934 80 y.o.  Admit Date: 05/23/2016 Referring Physician: Donnetta Hutchingook, Brian MD Primary Physician: Toma DeitersXAJE A HASANAJ, MD Consulting Cardiologist: Dina RichBranch, Brionne Mertz MD Primary Cardiologist: Nona DellMcDowell, Oluwatimilehin MD Reason for Consultation: Chest Pain   Clinical Summary Allen [name] is a 82 y.o.male with known history of coronary artery disease, status post discharge from [facility] after undergoing coronary artery bypass grafting on 05/08/2016. This was due to abnormal stress test revealing anterior apical ischemia with an EF of 32%. Coronary artery bypass grafting (LIMA to LAD, SVG to diagonal, SVG to RCA, sequential SVG to OM1 and OM 2, with endoscopic vein harvest from the right leg. The patient was at home recovering and in his usual state of health when he awoke this morning feeling some pressure on the left side of his chest.   He states that became progressively worse and moved be tied his left shoulder. He got up and walked around, pressure continued, as result of this he presented to the emergency room. Prior to coming to the emergency room as he was walking from his car he burped, several times, and sensation went away. He is currently pain free and anxious to return home.  On arrival to the emergency room patient's blood pressure 147/89, heart rate 81, O2 sat 98% he was afebrile. Hemoglobin 11.3 hematocrit 36.8 (improved since discharge from 9.4-29.2). Troponin 0.58, this is improved from prior troponin 05/04/2016 at 15.3. Chest x-ray revealed increased density in the left lung base representing a combination of small pleural effusion and atelectasis. No CHF no pneumonia. Chest x-ray revealed sinus rhythm with left bundle-Delaina Fetsch block heart rate of 62 bpm.   Allergies  Allergen Reactions  . Sulfa Antibiotics     Pt stated he was allergic to "Sulfa" drugs at pre-op assessment 05/08/2016.     Medications Scheduled Medications:    Infusions:    PRN Medications:     Past Medical History:  Diagnosis Date  . CAD (coronary artery disease)    Multivessel obstructive CAD involving the RCA, circumflex, and obtuse marginal system with only mild LAD disease May 2016  . Diabetes mellitus without complication (HCC)   . Essential hypertension   . Hyperlipidemia   . Ischemic cardiomyopathy    LVEF 35% May 2016  . LBBB (left bundle Ahan Eisenberger block)   . Sinus bradycardia     Past Surgical History:  Procedure Laterality Date  . CARDIAC CATHETERIZATION N/A 11/02/2014   Procedure: Left Heart Cath and Coronary Angiography;  Surgeon: Lyn RecordsHenry W Smith, MD;  [facility];  Service: Cardiovascular;  Laterality: N/A;  . CARDIAC CATHETERIZATION N/A 05/03/2016   Procedure: Left Heart Cath and Coronary Angiography;  Surgeon: Peter M SwazilandJordan, MD;  [facility];  Service: Cardiovascular;  Laterality: N/A;  . CORONARY ARTERY BYPASS GRAFT N/A 05/08/2016   Procedure: CORONARY ARTERY BYPASS GRAFTING (CABG) x 5;  Surgeon: Alleen BorneBryan K Bartle, MD;  Location: [facility] OR;  Service: Open Heart Surgery;  Laterality: N/A;  . ENDOVEIN HARVEST OF GREATER SAPHENOUS VEIN  05/08/2016   Procedure: ENDOVEIN HARVEST OF GREATER SAPHENOUS VEIN;  Surgeon: Alleen BorneBryan K Bartle, MD;  Location: [facility] OR;  Service: Open Heart Surgery;;  . TEE WITHOUT CARDIOVERSION N/A 05/08/2016   Procedure: TRANSESOPHAGEAL ECHOCARDIOGRAM (TEE);  Surgeon: Alleen BorneBryan K Bartle, MD;  [facility];  Service: Open Heart Surgery;  Laterality: N/A;    Family History  Problem Relation Age of  Onset  . Coronary artery disease Brother     Social History Allen [name] reports that he quit smoking about 4 years ago. He has never used smokeless tobacco. Allen [name] reports that he does not drink alcohol.  Review of Systems Complete review of systems are found to be negative unless outlined in H&P above.  Physical Examination Blood  pressure 111/85, pulse 69, temperature 98 F (36.7 C), temperature source Oral, resp. rate 17, height 5\' 2"  (1.575 m), weight 168 lb (76.2 kg), SpO2 96 %. No intake or output data in the 24 hours ending 05/23/16 1047  Telemetry: Sinus rhythm with left bundle Barba Solt block heart rate 62 bpm  ZOX:[id number]:Resting comfortably in no acute distress  HEENT: Conjunctiva and lids normal, oropharynx clear with moist mucosa. Neck: Supple, no elevated JVP or carotid bruits, no thyromegaly. Lungs: Clear to auscultation, diminished in the left base  Cardiac: Regular rate and rhythm, no S3 or significant systolic murmur, no pericardial rub. Abdomen: Soft, nontender, hyperactive bowel sounds  no hepatomegaly,, no guarding or rebound. Extremities: No pitting edema, distal pulses 2+. Skin: Warm and dry. Musculoskeletal: No kyphosis. Neuropsychiatric: Alert and oriented x3, affect grossly appropriate.  Prior Cardiac Testing/Procedures 1. Cardiac Cath 05/03/2016 Conclusion     LV end diastolic pressure is mildly elevated.  Ostial LAD stenosis- 70 %stenosed.  Prox LAD to Mid LAD lesion, 35 %stenosed.  1st Diag lesion, 85 %stenosed.  Ost Cx to Prox Cx lesion, 95 %stenosed.  Ost 1st Mrg to 1st Mrg lesion, 70 %stenosed.  Ost 2nd Mrg lesion, 100 %stenosed.  Prox RCA-2 lesion, 80 %stenosed.  Prox RCA-1 lesion, 70 %stenosed.   1. Severe 3 vessel obstructive CAD. There is left main equivalent disease. Compared to prior study in May 2016 there is a ruptured plaque at the ostium of the LCx that extends into the ostial LAD. 2. Mildly elevated LVEDP   CABG 05/09/2016 1. Median Sternotomy 2. Extracorporeal circulation 3.   Coronary artery bypass grafting x 5   Left internal mammary graft to the LAD  SVG to diagonal  SVG to RCA  Sequential SVG to OM1 and OM2 4.   Endoscopic vein harvest from the right leg   Echocardiogram 05/06/2016 Left ventricle: The cavity size was normal. There was mild    concentric hypertrophy. Systolic function was mildly to   moderately reduced. The estimated ejection fraction was in the   range of 40% to 45%. Diffuse hypokinesis. Doppler parameters are   consistent with abnormal left ventricular relaxation (grade 1   diastolic dysfunction). - Ventricular septum: Septal motion showed abnormal function and   dyssynergy. These changes are consistent with intraventricular   conduction delay. - Aortic valve: There was very mild stenosis. There was mild   regurgitation. Valve area (VTI): 1.96 cm^2. Valve area (Vmax):   1.84 cm^2. Valve area (Vmean): 1.99 cm^2. - Mitral valve: There was mild regurgitation. - Atrial septum: No defect or patent foramen ovale was identified.  Lab Results  Basic Metabolic Panel:  Recent Labs Lab 05/23/16 0912  NA 139  K 4.9  CL 108  CO2 24  GLUCOSE 117*  BUN 22*  CREATININE 1.44*  CALCIUM 10.7*   CBC:  Recent Labs Lab 05/23/16 0912  WBC 8.6  HGB 11.3*  HCT 36.8*  MCV 92.2  PLT 594*    Cardiac Enzymes:  Recent Labs Lab 05/23/16 0912  TROPONINI 0.58*     Radiology: Dg Chest 2 View  Result Date: 05/23/2016 CLINICAL DATA:  Chest  pain for several hours, initial encounter EXAM: CHEST  2 VIEW COMPARISON:  05/10/2016 FINDINGS: Cardiac shadow remains enlarged. Postsurgical changes are again seen. Increased density is noted in the left lung base likely representing a combination of small effusion and atelectasis. No other focal infiltrate is noted. No bony abnormality is seen. IMPRESSION: Left basilar changes as described. Electronically Signed   By: Alcide Clever M.D.   On: 05/23/2016 10:13     ECG: SR with LBBB rate of 64 bpm.   Impression and Recommendations  1. Chest Pain: After awakening this morning the patient fell "a bubble and pressure" in the left upper chest, that continued for over an hour, radiating behind his left shoulder blade. Because he could not get comfortable he chose to come to the  emergency room. He did not take any nitroglycerin or any medications prior to coming to ER. As he was walking into the ER he had a large bur which she states helped to relieve the pain. He is burped a few times afterwards and he is completely pain-free.  Troponin is 0.56, that has trended down significantly from postoperative troponin of 15.13 on 05/04/2016. EKG reveals chronic left bundle Makahla Kiser block. Review of labs demonstrates normal potassium, creatinine 1.44. He has mild anemia which has improved significantly from 05/12/2016. He is presently comfortable and is anxious to return home.   2. Coronary artery disease: Status post coronary artery bypass grafting: Patient underwent 5 vessel coronary artery bypass grafting on 05/09/2016. Sternotomy site looks clean healthy without any evidence of infection. The patient remains on aspirin, lisinopril at home. He was not placed on beta blocker due to bradycardia. Patient should keep her appointment with Dr. Diona Browner previously scheduled for Thursday, May 25, 2016. Continue statin.   3. Hypertension: Blood pressures currently well-controlled.  4. Diabetes: Continue insulin therapy by mouth meds. Follow-up with primary care for ongoing management  5. GERD: Continue on PPI. Consider Mylanta for gas pain when necessary.  Signed: Bettey Mare. Lawrence NP [facility]  05/23/2016, 10:47 AM Co-Sign MD  Patient seen and discussed with NP Lyman Bishop, I agree with her documentation. 82 yo male with history of CAD with recent CABG 05/09/16. Also history of ICM LVEF 40-45%, chronic LBBB, HL, HTN, bradycardia presents with chest pain. Occurred after waking up around 4 AM. Dull left sided pain with no other associated symptoms, lasted 4 hours constantly and gradually resolved.    K 4.9, Cr 1.44, Hgb 11.3, Plt 594 Trop 0.58 EKG SR, chronic LBBB with occasional PVCs] CXR small left effusion, no acute process   82 yo male with history of CAD with CABG 2 weeks ago  presents with atypical chest pain. Symptoms lasted 4 hours constantly with no other assocaited symptoms, better with belching. Elevated troponin in ER likely lingering from recent NSTEMI and recent CABG, can sometimes take up to 2 weeks or more to resolve. EKG chronic LBBB, cannot evaluate for ischemia. Repeat 2nd troponin at noon, if trending down ok for discharge from ER.    Dina Rich MD

## 2016-05-24 MED FILL — Magnesium Sulfate Inj 50%: INTRAMUSCULAR | Qty: 10 | Status: AC

## 2016-05-24 MED FILL — Potassium Chloride Inj 2 mEq/ML: INTRAVENOUS | Qty: 40 | Status: AC

## 2016-05-24 MED FILL — Heparin Sodium (Porcine) Inj 1000 Unit/ML: INTRAMUSCULAR | Qty: 30 | Status: AC

## 2016-05-24 NOTE — Progress Notes (Signed)
Cardiology Office Note  Date: 05/25/2016   ID: Allen PuttSamuel J Requejo, DOB 08/26/1933, MRN [id number]  PCP: Toma DeitersXAJE A HASANAJ, MD  Primary Cardiologist: Nona DellSamuel McDowell, MD   Chief Complaint  Patient presents with  . Hospitalization Follow-up    History of Present Illness: Allen [name] is an 82 y.o. male that I last saw in November following [facility]. He had undergone a Myoview indicating small region of inferior apical ischemia with LVEF 32% with his previous cardiac catheterization from earlier in the year demonstrating 2 vessel obstructive CAD that was managed medically. We had planned to continue with medical therapy and follow-up echocardiogram to reassess LVEF. He felt like he was having reflux symptoms at that time which we also treated. He was however subsequently admitted to [facility] with worsening chest discomfort and had evidence of NSTEMI with troponin I increase to 15. He underwent cardiac catheterization showing progressive multivessel CAD and was seen by Dr. Laneta SimmersBartle who recommended CABG. He underwent LIMA to LAD, SVG to diagonal, SVG to RCA, and SVG to OM1 and OM 2 on November 28 . He did have some volume overload postoperatively that responded to diuretics, also renal insufficiency.  Records indicate that he was [facility] December 12 with chest discomfort. Troponin I was noted to be 0.58, likely trending down from his original presentation. He was seen by Dr. Wyline MoodBranch in consultation and ultimately cleared for discharge home without further workup.  He comes in today with his daughter for a follow-up visit. He states that he took something for "gas" and is not having any more chest pain at this time. Actually looks fairly good after recent CABG at age 82. He states that he is taking his medicines regularly. He has not had any leg edema after vein harvesting. Appetite is only fair. He sees Dr. Laneta SimmersBartle later this month.  Past Medical  History:  Diagnosis Date  . CAD (coronary artery disease)    Multivessel obstructive CAD involving the RCA, circumflex, and obtuse marginal system with only mild LAD disease May 2016  . Diabetes mellitus without complication (HCC)   . Essential hypertension   . Hyperlipidemia   . Ischemic cardiomyopathy    LVEF 35% May 2016  . LBBB (left bundle branch block)   . Sinus bradycardia     Past Surgical History:  Procedure Laterality Date  . CARDIAC CATHETERIZATION N/A 11/02/2014   Procedure: Left Heart Cath and Coronary Angiography;  Surgeon: Lyn RecordsHenry W Smith, MD;  [facility];  Service: Cardiovascular;  Laterality: N/A;  . CARDIAC CATHETERIZATION N/A 05/03/2016   Procedure: Left Heart Cath and Coronary Angiography;  Surgeon: Peter M SwazilandJordan, MD;  [facility];  Service: Cardiovascular;  Laterality: N/A;  . CORONARY ARTERY BYPASS GRAFT N/A 05/08/2016   Procedure: CORONARY ARTERY BYPASS GRAFTING (CABG) x 5;  Surgeon: Alleen BorneBryan K Bartle, MD;  Location: [facility] OR;  Service: Open Heart Surgery;  Laterality: N/A;  . ENDOVEIN HARVEST OF GREATER SAPHENOUS VEIN  05/08/2016   Procedure: ENDOVEIN HARVEST OF GREATER SAPHENOUS VEIN;  Surgeon: Alleen BorneBryan K Bartle, MD;  Location: [facility] OR;  Service: Open Heart Surgery;;  . TEE WITHOUT CARDIOVERSION N/A 05/08/2016   Procedure: TRANSESOPHAGEAL ECHOCARDIOGRAM (TEE);  Surgeon: Alleen BorneBryan K Bartle, MD;  [facility];  Service: Open Heart Surgery;  Laterality: N/A;    Current Outpatient Prescriptions  Medication Sig Dispense Refill  . amLODipine (NORVASC) 10 MG tablet  TAKE 1 TABLET ONE TIME DAILY 90 tablet 3  . aspirin 81 MG EC tablet Take 81 mg by mouth daily.    Marland Kitchen. atorvastatin (LIPITOR) 80 MG tablet Take 1 tablet (80 mg total) by mouth daily at 6 PM. 90 tablet 3  . clopidogrel (PLAVIX) 75 MG tablet TAKE 1 TABLET ONE TIME DAILY WITH BREAKFAST 90 tablet 3  . glimepiride (AMARYL) 2 MG tablet Take 2 mg by mouth 2 (two) times daily.    . Insulin  Glargine (LANTUS SOLOSTAR) 100 UNIT/ML Solostar Pen Inject 20 Units into the skin at bedtime. 15 mL 11  . lisinopril (PRINIVIL,ZESTRIL) 5 MG tablet Take 1 tablet (5 mg total) by mouth daily. 30 tablet 1  . oxyCODONE (OXY IR/ROXICODONE) 5 MG immediate release tablet Take 1 tablet (5 mg total) by mouth every 6 (six) hours as needed for severe pain. 28 tablet 0  . pantoprazole (PROTONIX) 20 MG tablet Take 1 tablet (20 mg total) by mouth daily. 90 tablet 3   No current facility-administered medications for this visit.    Allergies:  Patient has no active allergies.   Social History: The patient  reports that he quit smoking about 4 years ago. His smoking use included Cigarettes. He started smoking about 60 years ago. He has a 28.00 pack-year smoking history. He has never used smokeless tobacco. He reports that he does not drink alcohol or use drugs.   ROS:  Please see the history of present illness. Otherwise, complete review of systems is positive for none.  All other systems are reviewed and negative.   Physical Exam: VS:  BP 121/78   Pulse 64   Ht 5\' 2"  (1.575 m)   Wt 167 lb (75.8 kg)   BMI 30.54 kg/m , BMI Body mass index is 30.54 kg/m.  Wt Readings from Last 3 Encounters:  05/25/16 167 lb (75.8 kg)  05/23/16 168 lb (76.2 kg)  05/16/16 168 lb 1.6 oz (76.2 kg)    General: Elderly male, appears comfortable at rest. HEENT: Conjunctiva and lids normal, oropharynx clear. Neck: Supple, no elevated JVP or carotid bruits, no thyromegaly. Lungs: Decreased breath sounds at the bases, no crackles, nonlabored breathing at rest. Thorax: Well-healing sternal incision, no erythema or drainage. Cardiac: Regular rate and rhythm, no S3, no pericardial rub. Abdomen: Soft, nontender, bowel sounds present, no guarding or rebound. Extremities: No pitting edema, well-healing vein harvest site on the right. Skin: Warm and dry. Musculoskeletal: No kyphosis. Neuropsychiatric: Alert and oriented x3, affect  grossly appropriate.  ECG: I personally reviewed the tracing from 05/23/2016 which showed sinus rhythm with PVCs and left bundle branch block.  Recent Labwork: 05/07/2016: ALT 26; AST 50 05/12/2016: Magnesium 2.6 05/23/2016: BUN 22; Creatinine, Ser 1.44; Hemoglobin 11.3; Platelets 594; Potassium 4.9; Sodium 139     Component Value Date/Time   CHOL 170 11/02/2014 0213   TRIG 121 11/02/2014 0213   HDL 45 11/02/2014 0213   CHOLHDL 3.8 11/02/2014 0213   VLDL 24 11/02/2014 0213   LDLCALC 101 (H) 11/02/2014 0213    Other Studies Reviewed Today:  Echocardiogram 05/06/2016: Study Conclusions  - Left ventricle: The cavity size was normal. There was mild   concentric hypertrophy. Systolic function was mildly to   moderately reduced. The estimated ejection fraction was in the   range of 40% to 45%. Diffuse hypokinesis. Doppler parameters are   consistent with abnormal left ventricular relaxation (grade 1   diastolic dysfunction). - Ventricular septum: Septal motion showed abnormal function and  dyssynergy. These changes are consistent with intraventricular   conduction delay. - Aortic valve: There was very mild stenosis. There was mild   regurgitation. Valve area (VTI): 1.96 cm^2. Valve area (Vmax):   1.84 cm^2. Valve area (Vmean): 1.99 cm^2. - Mitral valve: There was mild regurgitation. - Atrial septum: No defect or patent foramen ovale was identified.  Carotid Dopplers 05/08/2016: Summary:  - The vertebral arteries appear patent with antegrade flow. - Findings consistent with 1- 39 percent stenosis involving the   right internal carotid artery and the left internal carotid   artery. - ABI is within normal limits.  Chest x-ray 05/23/2016: FINDINGS: Cardiac shadow remains enlarged. Postsurgical changes are again seen. Increased density is noted in the left lung base likely representing a combination of small effusion and atelectasis. No other focal infiltrate is noted. No  bony abnormality is seen.  IMPRESSION: Left basilar changes as described.  Assessment and Plan:  1. Progressive multivessel CAD status post recent NSTEMI with subsequent CABG as outlined above per Dr. Laneta Simmers. LVEF 40-45% range. He is recuperating well at this time in the early postoperative setting. He will be seeing Dr. Laneta Simmers later this month. Still with lifting restrictions in place and no driving. I did encourage him to consider cardiac rehabilitation in [place] where he lives. Recent chest x-ray shows small degree of pleural effusion and atelectasis at the left lung base. Do not plan to start diuretics at this time particular in light of his recent renal insufficiency.  2. Hyperlipidemia, now on high-dose Lipitor.  3. Essential hypertension, continues on lisinopril and Norvasc.  4. Renal insufficiency following CABG, creatinine has come down from 1.8 to 1.4.  5. Sinus bradycardia and left bundle branch block, not on beta blocker.  Current medicines were reviewed with the patient today.  Disposition: Follow-up in one month.  Signed, Jonelle Sidle, MD, [facility] 05/25/2016 10:54 AM    [facility] [address] [place], [place], [place] [address] Phone: [phone]; Fax: [phone]

## 2016-05-25 ENCOUNTER — Encounter: Payer: Self-pay | Admitting: Cardiology

## 2016-05-25 ENCOUNTER — Ambulatory Visit (INDEPENDENT_AMBULATORY_CARE_PROVIDER_SITE_OTHER): Payer: Medicare PPO | Admitting: Cardiology

## 2016-05-25 VITALS — BP 121/78 | HR 64 | Ht 62.0 in | Wt 167.0 lb

## 2016-05-25 DIAGNOSIS — Z951 Presence of aortocoronary bypass graft: Secondary | ICD-10-CM | POA: Diagnosis not present

## 2016-05-25 DIAGNOSIS — I255 Ischemic cardiomyopathy: Secondary | ICD-10-CM

## 2016-05-25 DIAGNOSIS — R001 Bradycardia, unspecified: Secondary | ICD-10-CM

## 2016-05-25 DIAGNOSIS — I25709 Atherosclerosis of coronary artery bypass graft(s), unspecified, with unspecified angina pectoris: Secondary | ICD-10-CM

## 2016-05-25 DIAGNOSIS — N289 Disorder of kidney and ureter, unspecified: Secondary | ICD-10-CM

## 2016-05-25 DIAGNOSIS — I10 Essential (primary) hypertension: Secondary | ICD-10-CM

## 2016-05-25 NOTE — Patient Instructions (Signed)
Medication Instructions:  Continue all current medications.  Labwork: none  Testing/Procedures: none  Follow-Up: 1 month   Any Other Special Instructions Will Be Listed Below (If Applicable).  If you need a refill on your cardiac medications before your next appointment, please call your pharmacy.  

## 2016-05-29 ENCOUNTER — Other Ambulatory Visit: Payer: Self-pay | Admitting: Surgery

## 2016-05-29 DIAGNOSIS — Z951 Presence of aortocoronary bypass graft: Secondary | ICD-10-CM

## 2016-05-31 ENCOUNTER — Ambulatory Visit: Payer: Medicare PPO | Admitting: Cardiology

## 2016-06-04 ENCOUNTER — Other Ambulatory Visit: Payer: Self-pay | Admitting: Surgical

## 2016-06-07 ENCOUNTER — Encounter: Payer: Self-pay | Admitting: Surgery

## 2016-06-07 ENCOUNTER — Ambulatory Visit (INDEPENDENT_AMBULATORY_CARE_PROVIDER_SITE_OTHER): Payer: Self-pay | Admitting: Surgery

## 2016-06-07 ENCOUNTER — Ambulatory Visit
Admission: RE | Admit: 2016-06-07 | Discharge: 2016-06-07 | Disposition: A | Discharge status: Home / Self Care | Payer: Medicare PPO | Source: Ambulatory Visit | Attending: Surgery | Admitting: Surgery

## 2016-06-07 VITALS — BP 117/72 | HR 57 | Resp 16 | Ht 62.0 in | Wt 170.0 lb

## 2016-06-07 DIAGNOSIS — J90 Pleural effusion, not elsewhere classified: Secondary | ICD-10-CM | POA: Diagnosis not present

## 2016-06-07 DIAGNOSIS — Z951 Presence of aortocoronary bypass graft: Secondary | ICD-10-CM

## 2016-06-07 DIAGNOSIS — I2510 Atherosclerotic heart disease of native coronary artery without angina pectoris: Secondary | ICD-10-CM

## 2016-06-07 NOTE — Progress Notes (Signed)
ALL OPERATIVE INCISIONS ARE VERY WELL HEALED. 

## 2016-06-07 NOTE — Progress Notes (Signed)
     HPI: Patient returns for routine postoperative follow-up having undergone CABG x 5 on 05/09/2016. The patient's early postoperative recovery while in the hospital was notable for acute worsening of chronic renal failure which improved with time. His creatinine peaked at 1.83 and improved to 1.39 prior to discharge. Since hospital discharge the patient reports that he has been slowly improving. He did go to [facility] ER with chest pain on 12/12 and was seen by Dr. Wyline MoodBranch and cleared to go home. He still complains of chest wall pain from the incision but it is improving. He denies shortness of breath and has been ambulating some, although not much outside.   Current Outpatient Prescriptions  Medication Sig Dispense Refill  . amLODipine (NORVASC) 10 MG tablet TAKE 1 TABLET ONE TIME DAILY 90 tablet 3  . aspirin 81 MG EC tablet Take 81 mg by mouth daily.    Marland Kitchen. atorvastatin (LIPITOR) 80 MG tablet Take 1 tablet (80 mg total) by mouth daily at 6 PM. 90 tablet 3  . clopidogrel (PLAVIX) 75 MG tablet TAKE 1 TABLET ONE TIME DAILY WITH BREAKFAST 90 tablet 3  . glimepiride (AMARYL) 2 MG tablet Take 2 mg by mouth 2 (two) times daily.    . Insulin Glargine (LANTUS SOLOSTAR) 100 UNIT/ML Solostar Pen Inject 20 Units into the skin at bedtime. 15 mL 11  . lisinopril (PRINIVIL,ZESTRIL) 5 MG tablet Take 1 tablet (5 mg total) by mouth daily. 30 tablet 1  . pantoprazole (PROTONIX) 20 MG tablet Take 1 tablet (20 mg total) by mouth daily. 90 tablet 3  . oxyCODONE (OXY IR/ROXICODONE) 5 MG immediate release tablet Take 1 tablet (5 mg total) by mouth every 6 (six) hours as needed for severe pain. (Patient not taking: Reported on 06/07/2016) 28 tablet 0   No current facility-administered medications for this visit.     Physical Exam: BP 117/72 (BP Location: Left Arm, Patient Position: Sitting, Cuff Size: Large)   Pulse (!) 57   Resp 16   Ht 5\' 2"  (1.575 m)   Wt 170 lb (77.1 kg)   SpO2 97% Comment: ON RA  BMI 31.09  kg/m  He looks well. Lung exam is clear. Cardiac exam shows a regular rate and rhythm with normal heart sounds. Chest incision is healing well and sternum is stable. The leg incisions are healing well and there is no peripheral edema.   Diagnostic Tests:  CLINICAL DATA:  Coronary artery disease.  CABG on 05/08/2016.  EXAM: CHEST  2 VIEW  COMPARISON:  05/23/2016 and 05/10/2016  FINDINGS: There is a small residual left pleural effusion, diminished since the prior exam. No pneumothorax. Right lung is clear. Chronic cardiomegaly. CABG.  IMPRESSION: Decreasing small residual left pleural effusion.  Chronic slight cardiomegaly.   Electronically Signed   By: Francene BoyersJames  Maxwell M.D.   On: 06/07/2016 13:00   Impression:  Overall I think he is doing well. I encouraged him to continue walking and to use his incentive spirometer when he can't get outside to walk. He is planning to participate in cardiac rehab. I told him he could drive his car but should not lift anything heavier than 10 lbs for three months postop.   Plan:  He will continue to follow up with Dr. Diona BrownerMcDowell and will contact me if he has any problems with his incisions.   Alleen BorneBryan K Bartle, MD [facility] [phone]

## 2016-06-08 DIAGNOSIS — E7800 Pure hypercholesterolemia, unspecified: Secondary | ICD-10-CM | POA: Diagnosis not present

## 2016-06-08 DIAGNOSIS — E119 Type 2 diabetes mellitus without complications: Secondary | ICD-10-CM | POA: Diagnosis not present

## 2016-06-08 DIAGNOSIS — M1990 Unspecified osteoarthritis, unspecified site: Secondary | ICD-10-CM | POA: Diagnosis not present

## 2016-06-08 DIAGNOSIS — I10 Essential (primary) hypertension: Secondary | ICD-10-CM | POA: Diagnosis not present

## 2016-06-08 DIAGNOSIS — Z79899 Other long term (current) drug therapy: Secondary | ICD-10-CM | POA: Diagnosis not present

## 2016-06-08 DIAGNOSIS — Z87891 Personal history of nicotine dependence: Secondary | ICD-10-CM | POA: Diagnosis not present

## 2016-06-08 DIAGNOSIS — Z794 Long term (current) use of insulin: Secondary | ICD-10-CM | POA: Diagnosis not present

## 2016-06-08 DIAGNOSIS — M810 Age-related osteoporosis without current pathological fracture: Secondary | ICD-10-CM | POA: Diagnosis not present

## 2016-06-08 DIAGNOSIS — Z7982 Long term (current) use of aspirin: Secondary | ICD-10-CM | POA: Diagnosis not present

## 2016-06-23 ENCOUNTER — Telehealth: Payer: Self-pay | Admitting: Cardiology

## 2016-06-23 NOTE — Telephone Encounter (Signed)
[facility] wanted someone from our office to contact patient in reference to him having a change in SOB that does resolve after time

## 2016-06-23 NOTE — Telephone Encounter (Signed)
Pt c/o SOB on exertion - says resolves after rest - denies dizziness/chest pain/swelling - pt has f/u appt with Dr. Diona BrownerMcDowell 1/19 - pt will contact office or report to ED if SOB worsen or develops other symptoms

## 2016-06-30 ENCOUNTER — Ambulatory Visit (INDEPENDENT_AMBULATORY_CARE_PROVIDER_SITE_OTHER): Payer: Medicare PPO | Admitting: Cardiology

## 2016-06-30 ENCOUNTER — Encounter: Payer: Self-pay | Admitting: Cardiology

## 2016-06-30 ENCOUNTER — Ambulatory Visit: Payer: Medicare PPO | Admitting: Cardiology

## 2016-06-30 VITALS — BP 137/85 | HR 57 | Ht 62.0 in | Wt 170.4 lb

## 2016-06-30 DIAGNOSIS — R001 Bradycardia, unspecified: Secondary | ICD-10-CM

## 2016-06-30 DIAGNOSIS — I255 Ischemic cardiomyopathy: Secondary | ICD-10-CM | POA: Diagnosis not present

## 2016-06-30 DIAGNOSIS — E782 Mixed hyperlipidemia: Secondary | ICD-10-CM | POA: Diagnosis not present

## 2016-06-30 DIAGNOSIS — I25709 Atherosclerosis of coronary artery bypass graft(s), unspecified, with unspecified angina pectoris: Secondary | ICD-10-CM

## 2016-06-30 NOTE — Patient Instructions (Signed)
Medication Instructions:  Continue all current medications.  Labwork: none  Testing/Procedures: Your physician has requested that you have an echocardiogram. Echocardiography is a painless test that uses sound waves to create images of your heart. It provides your doctor with information about the size and shape of your heart and how well your heart's chambers and valves are working. This procedure takes approximately one hour. There are no restrictions for this procedure. - DUE JUST PRIOR TO NEXT OFFICE VISIT.  Follow-Up: Your physician wants you to follow up in:  3 months.  You will receive a reminder letter in the mail one-two months in advance.  If you don't receive a letter, please call our office to schedule the follow up appointment   Any Other Special Instructions Will Be Listed Below (If Applicable).  If you need a refill on your cardiac medications before your next appointment, please call your pharmacy.

## 2016-06-30 NOTE — Progress Notes (Signed)
Cardiology Office Note  Date: 06/30/2016   ID: Allen PuttSamuel J [name], DOB 06/14/1933, MRN [id number]  PCP: Toma DeitersXAJE A HASANAJ, MD  Primary Cardiologist: Nona DellSamuel Calyssa Zobrist, MD   Chief Complaint  Patient presents with  . Coronary Artery Disease    History of Present Illness: Allen [name] is an [age] y.o. male last seen in December 2017. Follow-up visit noted with Dr. Laneta SimmersBartle. He is recuperating slowly from CABG in November 2017. Reports occasional mild soreness in his chest, sternal wound has healed well. No significant leg edema. Some shortness of breath with activity, but he has not been consistent with a walking plan. It sounds like he will not pursue cardiac rehabilitation due to the cost.  I reviewed his medications which are outlined below. He is not on a beta blocker due to low resting heart rate.  Echocardiogram from November 2017 revealed LVEF 40-45% with very mild aortic stenosis.  Past Medical History:  Diagnosis Date  . CAD (coronary artery disease)    Multivessel obstructive CAD involving the RCA, circumflex, and obtuse marginal system with only mild LAD disease May 2016  . Diabetes mellitus without complication (HCC)   . Essential hypertension   . Hyperlipidemia   . Ischemic cardiomyopathy    LVEF 35% May 2016  . LBBB (left bundle branch block)   . Sinus bradycardia     Past Surgical History:  Procedure Laterality Date  . CARDIAC CATHETERIZATION N/A 11/02/2014   Procedure: Left Heart Cath and Coronary Angiography;  Surgeon: Lyn RecordsHenry W Smith, MD;  [facility];  Service: Cardiovascular;  Laterality: N/A;  . CARDIAC CATHETERIZATION N/A 05/03/2016   Procedure: Left Heart Cath and Coronary Angiography;  Surgeon: Peter M SwazilandJordan, MD;  [facility];  Service: Cardiovascular;  Laterality: N/A;  . CORONARY ARTERY BYPASS GRAFT N/A 05/08/2016   Procedure: CORONARY ARTERY BYPASS GRAFTING (CABG) x 5;  Surgeon: Alleen BorneBryan K Bartle, MD;  Location: [facility] OR;  Service:  Open Heart Surgery;  Laterality: N/A;  . ENDOVEIN HARVEST OF GREATER SAPHENOUS VEIN  05/08/2016   Procedure: ENDOVEIN HARVEST OF GREATER SAPHENOUS VEIN;  Surgeon: Alleen BorneBryan K Bartle, MD;  Location: [facility] OR;  Service: Open Heart Surgery;;  . TEE WITHOUT CARDIOVERSION N/A 05/08/2016   Procedure: TRANSESOPHAGEAL ECHOCARDIOGRAM (TEE);  Surgeon: Alleen BorneBryan K Bartle, MD;  [facility];  Service: Open Heart Surgery;  Laterality: N/A;    Current Outpatient Prescriptions  Medication Sig Dispense Refill  . amLODipine (NORVASC) 10 MG tablet TAKE 1 TABLET ONE TIME DAILY 90 tablet 3  . aspirin 81 MG EC tablet Take 81 mg by mouth daily.    Marland Kitchen. atorvastatin (LIPITOR) 80 MG tablet Take 1 tablet (80 mg total) by mouth daily at 6 PM. 90 tablet 3  . clopidogrel (PLAVIX) 75 MG tablet TAKE 1 TABLET ONE TIME DAILY WITH BREAKFAST 90 tablet 3  . glimepiride (AMARYL) 2 MG tablet Take 2 mg by mouth 2 (two) times daily.    . Insulin Glargine (LANTUS SOLOSTAR) 100 UNIT/ML Solostar Pen Inject 20 Units into the skin at bedtime. 15 mL 11  . lisinopril (PRINIVIL,ZESTRIL) 5 MG tablet Take 1 tablet (5 mg total) by mouth daily. 30 tablet 1  . oxyCODONE (OXY IR/ROXICODONE) 5 MG immediate release tablet Take 1 tablet (5 mg total) by mouth every 6 (six) hours as needed for severe pain. 28 tablet 0  . pantoprazole (PROTONIX) 20 MG tablet Take 1 tablet (20 mg total) by mouth daily. 90 tablet 3  No current facility-administered medications for this visit.    Allergies:  Patient has no known allergies.   Social History: The patient  reports that he quit smoking about 5 years ago. His smoking use included Cigarettes. He started smoking about 60 years ago. He has a 28.00 pack-year smoking history. He has never used smokeless tobacco. He reports that he does not drink alcohol or use drugs.   ROS:  Please see the history of present illness. Otherwise, complete review of systems is positive for hearing loss.  All other systems are reviewed and  negative.   Physical Exam: VS:  BP 137/85   Pulse (!) 57   Ht 5\' 2"  (1.575 m)   Wt 170 lb 6.4 oz (77.3 kg)   BMI 31.17 kg/m , BMI Body mass index is 31.17 kg/m.  Wt Readings from Last 3 Encounters:  06/30/16 170 lb 6.4 oz (77.3 kg)  06/07/16 170 lb (77.1 kg)  05/25/16 167 lb (75.8 kg)    General: Elderly male, appears comfortable at rest. HEENT: Conjunctiva and lids normal, oropharynx clear. Neck: Supple, no elevated JVP or carotid bruits, no thyromegaly. Lungs: Decreased breath sounds at the bases, no crackles, nonlabored breathing at rest. Thorax: Well-healing sternal incision, no erythema or drainage. Cardiac: Regular rate and rhythm, no S3, no pericardial rub. Abdomen: Soft, nontender, bowel sounds present, no guarding or rebound. Extremities: No pitting edema, well-healing vein harvest site on the right. Skin: Warm and dry. Musculoskeletal: No kyphosis. Neuropsychiatric: Alert and oriented x3, affect grossly appropriate.  ECG: I personally reviewed the tracing from 05/23/2016 which showed sinus rhythm with PVCs and left bundle branch block.  Recent Labwork: 05/07/2016: ALT 26; AST 50 05/12/2016: Magnesium 2.6 05/23/2016: BUN 22; Creatinine, Ser 1.44; Hemoglobin 11.3; Platelets 594; Potassium 4.9; Sodium 139     Component Value Date/Time   CHOL 170 11/02/2014 0213   TRIG 121 11/02/2014 0213   HDL 45 11/02/2014 0213   CHOLHDL 3.8 11/02/2014 0213   VLDL 24 11/02/2014 0213   LDLCALC 101 (H) 11/02/2014 0213    Other Studies Reviewed Today:  Echocardiogram 05/06/2016: Study Conclusions  - Left ventricle: The cavity size was normal. There was mild concentric hypertrophy. Systolic function was mildly to moderately reduced. The estimated ejection fraction was in the range of 40% to 45%. Diffuse hypokinesis. Doppler parameters are consistent with abnormal left ventricular relaxation (grade 1 diastolic dysfunction). - Ventricular septum: Septal motion showed  abnormal function and dyssynergy. These changes are consistent with intraventricular conduction delay. - Aortic valve: There was very mild stenosis. There was mild regurgitation. Valve area (VTI): 1.96 cm^2. Valve area (Vmax): 1.84 cm^2. Valve area (Vmean): 1.99 cm^2. - Mitral valve: There was mild regurgitation. - Atrial septum: No defect or patent foramen ovale was identified.  Carotid Dopplers 05/08/2016: Summary:  - The vertebral arteries appear patent with antegrade flow. - Findings consistent with 1- 39 percent stenosis involving the right internal carotid artery and the left internal carotid artery. - ABI is within normal limits.  Assessment and Plan:  1. Multivessel CAD status post CABG in November 2017 with Dr. Laneta Simmers. He is recovering slowly, I have encouraged a regular walking plan particularly if he does not plan to pursue cardiac rehabilitation. No changes in current medical regimen.  2. Essential hypertension, no change to current doses of lisinopril and Norvasc.  3. Hyperlipidemia, tolerating Lipitor.  4. Sinus bradycardia and left bundle branch block, not on beta blocker.  5. Ischemic cardiomyopathy, LVEF 40-45% around the time  of revascularization surgery. Follow-up study will be obtained for his next visit.  Current medicines were reviewed with the patient today.   Orders Placed This Encounter  Procedures  . ECHOCARDIOGRAM COMPLETE    Disposition: Follow-up in 3 months.  Signed, Jonelle Sidle, MD, [facility] 06/30/2016 11:54 AM    [facility] [address] [place], [place], [place] [address] Phone: [phone]; Fax: [phone]

## 2016-07-10 ENCOUNTER — Other Ambulatory Visit: Payer: Self-pay | Admitting: Cardiology

## 2016-07-10 MED ORDER — LISINOPRIL 5 MG PO TABS: 5.0000 mg | ORAL_TABLET | Freq: Every day | ORAL | 1 refills | Status: DC

## 2016-07-10 MED ORDER — PANTOPRAZOLE SODIUM 20 MG PO TBEC: 20.0000 mg | DELAYED_RELEASE_TABLET | Freq: Every day | ORAL | 1 refills | Status: DC

## 2016-07-10 NOTE — Telephone Encounter (Signed)
pantoprazole (PROTONIX) 20 MG tablet   lisinopril (PRINIVIL,ZESTRIL) 5 MG tablet   90 supply  [facility]

## 2016-07-10 NOTE — Telephone Encounter (Signed)
Medication sent to pharmacy  

## 2016-07-18 DIAGNOSIS — I10 Essential (primary) hypertension: Secondary | ICD-10-CM | POA: Diagnosis not present

## 2016-07-18 DIAGNOSIS — Z6831 Body mass index (BMI) 31.0-31.9, adult: Secondary | ICD-10-CM | POA: Diagnosis not present

## 2016-07-18 DIAGNOSIS — E1121 Type 2 diabetes mellitus with diabetic nephropathy: Secondary | ICD-10-CM | POA: Diagnosis not present

## 2016-07-18 DIAGNOSIS — I2584 Coronary atherosclerosis due to calcified coronary lesion: Secondary | ICD-10-CM | POA: Diagnosis not present

## 2016-07-18 DIAGNOSIS — Z0000 Encounter for general adult medical examination without abnormal findings: Secondary | ICD-10-CM | POA: Diagnosis not present

## 2016-07-18 DIAGNOSIS — K210 Gastro-esophageal reflux disease with esophagitis: Secondary | ICD-10-CM | POA: Diagnosis not present

## 2016-07-20 DIAGNOSIS — M542 Cervicalgia: Secondary | ICD-10-CM | POA: Diagnosis not present

## 2016-07-20 DIAGNOSIS — K210 Gastro-esophageal reflux disease with esophagitis: Secondary | ICD-10-CM | POA: Diagnosis not present

## 2016-07-20 DIAGNOSIS — I10 Essential (primary) hypertension: Secondary | ICD-10-CM | POA: Diagnosis not present

## 2016-07-20 DIAGNOSIS — E1121 Type 2 diabetes mellitus with diabetic nephropathy: Secondary | ICD-10-CM | POA: Diagnosis not present

## 2016-07-20 DIAGNOSIS — I2584 Coronary atherosclerosis due to calcified coronary lesion: Secondary | ICD-10-CM | POA: Diagnosis not present

## 2016-08-14 DIAGNOSIS — K210 Gastro-esophageal reflux disease with esophagitis: Secondary | ICD-10-CM | POA: Diagnosis not present

## 2016-08-14 DIAGNOSIS — I2584 Coronary atherosclerosis due to calcified coronary lesion: Secondary | ICD-10-CM | POA: Diagnosis not present

## 2016-08-14 DIAGNOSIS — I10 Essential (primary) hypertension: Secondary | ICD-10-CM | POA: Diagnosis not present

## 2016-08-14 DIAGNOSIS — M542 Cervicalgia: Secondary | ICD-10-CM | POA: Diagnosis not present

## 2016-08-14 DIAGNOSIS — E1121 Type 2 diabetes mellitus with diabetic nephropathy: Secondary | ICD-10-CM | POA: Diagnosis not present

## 2016-09-13 DIAGNOSIS — H5203 Hypermetropia, bilateral: Secondary | ICD-10-CM | POA: Diagnosis not present

## 2016-09-13 DIAGNOSIS — E109 Type 1 diabetes mellitus without complications: Secondary | ICD-10-CM | POA: Diagnosis not present

## 2016-09-13 DIAGNOSIS — H01022 Squamous blepharitis right lower eyelid: Secondary | ICD-10-CM | POA: Diagnosis not present

## 2016-09-13 DIAGNOSIS — H01025 Squamous blepharitis left lower eyelid: Secondary | ICD-10-CM | POA: Diagnosis not present

## 2016-09-13 DIAGNOSIS — H2513 Age-related nuclear cataract, bilateral: Secondary | ICD-10-CM | POA: Diagnosis not present

## 2016-09-14 DIAGNOSIS — I10 Essential (primary) hypertension: Secondary | ICD-10-CM | POA: Diagnosis not present

## 2016-09-14 DIAGNOSIS — E1121 Type 2 diabetes mellitus with diabetic nephropathy: Secondary | ICD-10-CM | POA: Diagnosis not present

## 2016-09-14 DIAGNOSIS — K210 Gastro-esophageal reflux disease with esophagitis: Secondary | ICD-10-CM | POA: Diagnosis not present

## 2016-09-14 DIAGNOSIS — M542 Cervicalgia: Secondary | ICD-10-CM | POA: Diagnosis not present

## 2016-09-14 DIAGNOSIS — I2584 Coronary atherosclerosis due to calcified coronary lesion: Secondary | ICD-10-CM | POA: Diagnosis not present

## 2016-09-21 ENCOUNTER — Telehealth: Payer: Self-pay

## 2016-09-21 ENCOUNTER — Ambulatory Visit (INDEPENDENT_AMBULATORY_CARE_PROVIDER_SITE_OTHER): Payer: Medicare PPO

## 2016-09-21 ENCOUNTER — Other Ambulatory Visit: Payer: Self-pay

## 2016-09-21 DIAGNOSIS — I255 Ischemic cardiomyopathy: Secondary | ICD-10-CM

## 2016-09-21 NOTE — Telephone Encounter (Signed)
Patient notified. Routed to PCP 

## 2016-09-21 NOTE — Telephone Encounter (Signed)
-----   Message from Jonelle Sidle, MD sent at 09/21/2016  1:01 PM EDT ----- Results reviewed. Overall no major change in LVEF, 43% by follow-up testing. We will continue with current medical therapy and follow-up plan. A copy of this test should be forwarded to [name], MD.

## 2016-10-11 ENCOUNTER — Ambulatory Visit: Payer: Medicare PPO | Admitting: Cardiology

## 2016-10-11 DIAGNOSIS — K210 Gastro-esophageal reflux disease with esophagitis: Secondary | ICD-10-CM | POA: Diagnosis not present

## 2016-10-11 DIAGNOSIS — E1121 Type 2 diabetes mellitus with diabetic nephropathy: Secondary | ICD-10-CM | POA: Diagnosis not present

## 2016-10-11 DIAGNOSIS — I10 Essential (primary) hypertension: Secondary | ICD-10-CM | POA: Diagnosis not present

## 2016-10-11 DIAGNOSIS — M542 Cervicalgia: Secondary | ICD-10-CM | POA: Diagnosis not present

## 2016-10-11 DIAGNOSIS — I2584 Coronary atherosclerosis due to calcified coronary lesion: Secondary | ICD-10-CM | POA: Diagnosis not present

## 2016-10-27 NOTE — Progress Notes (Signed)
Cardiology Office Note  Date: 10/30/2016   ID: Allen PuttSamuel J Berkey, DOB 11/12/1933, MRN [id number]  PCP: Toma DeitersHasanaj, Xaje A, MD  Primary Cardiologist: Nona DellSamuel McDowell, MD   Chief Complaint  Patient presents with  . Coronary Artery Disease  . Cardiomyopathy    History of Present Illness: Allen [name] is an 83 y.o. male last seen in January. He presents for a routine follow-up visit. Describes stable NYHA class II dyspnea. He has been trying to do some walking outdoors recently for exercise. He is not reporting any angina symptoms or palpitations. Occasionally has a mild feeling of tightness in his right lower leg following vein harvesting but not having to use a diuretic regularly.  Follow-up echocardiogram from April is outlined below, LVEF in the 40-45% range. I discussed the results with him today. We plan to continue medical therapy which now includes aspirin, Norvasc, Lipitor, Plavix, and lisinopril. He is not on beta blocker with history of bradycardia and left bundle-branch block.  Past Medical History:  Diagnosis Date  . CAD (coronary artery disease)    Multivessel obstructive CAD involving the RCA, circumflex, and obtuse marginal system with only mild LAD disease May 2016  . Diabetes mellitus without complication (HCC)   . Essential hypertension   . Hyperlipidemia   . Ischemic cardiomyopathy    LVEF 35% May 2016  . LBBB (left bundle branch block)   . Sinus bradycardia     Past Surgical History:  Procedure Laterality Date  . CARDIAC CATHETERIZATION N/A 11/02/2014   Procedure: Left Heart Cath and Coronary Angiography;  Surgeon: Lyn RecordsHenry W Smith, MD;  [facility];  Service: Cardiovascular;  Laterality: N/A;  . CARDIAC CATHETERIZATION N/A 05/03/2016   Procedure: Left Heart Cath and Coronary Angiography;  Surgeon: Peter M SwazilandJordan, MD;  [facility];  Service: Cardiovascular;  Laterality: N/A;  . CORONARY ARTERY BYPASS GRAFT N/A 05/08/2016   Procedure: CORONARY ARTERY BYPASS GRAFTING (CABG) x 5;  Surgeon: Alleen BorneBryan K Bartle, MD;  Location: [facility] OR;  Service: Open Heart Surgery;  Laterality: N/A;  . ENDOVEIN HARVEST OF GREATER SAPHENOUS VEIN  05/08/2016   Procedure: ENDOVEIN HARVEST OF GREATER SAPHENOUS VEIN;  Surgeon: Alleen BorneBryan K Bartle, MD;  [facility];  Service: Open Heart Surgery;;  . TEE WITHOUT CARDIOVERSION N/A 05/08/2016   Procedure: TRANSESOPHAGEAL ECHOCARDIOGRAM (TEE);  Surgeon: Alleen BorneBryan K Bartle, MD;  [facility];  Service: Open Heart Surgery;  Laterality: N/A;    Current Outpatient Prescriptions  Medication Sig Dispense Refill  . amLODipine (NORVASC) 10 MG tablet TAKE 1 TABLET ONE TIME DAILY 90 tablet 3  . aspirin 81 MG EC tablet Take 81 mg by mouth daily.    Marland Kitchen. atorvastatin (LIPITOR) 80 MG tablet Take 1 tablet (80 mg total) by mouth daily at 6 PM. 90 tablet 3  . clopidogrel (PLAVIX) 75 MG tablet TAKE 1 TABLET ONE TIME DAILY WITH BREAKFAST 90 tablet 3  . glimepiride (AMARYL) 2 MG tablet Take 2 mg by mouth 2 (two) times daily.    . Insulin Glargine (LANTUS SOLOSTAR) 100 UNIT/ML Solostar Pen Inject 20 Units into the skin at bedtime. 15 mL 11  . lisinopril (PRINIVIL,ZESTRIL) 5 MG tablet Take 1 tablet (5 mg total) by mouth daily. 90 tablet 1  . oxyCODONE (OXY IR/ROXICODONE) 5 MG immediate release tablet Take 1 tablet (5 mg total) by mouth every 6 (six) hours as needed for severe pain. 28 tablet 0  . pantoprazole (PROTONIX) 20 MG tablet  Take 1 tablet (20 mg total) by mouth daily. 90 tablet 1   No current facility-administered medications for this visit.    Allergies:  Patient has no known allergies.   Social History: The patient  reports that he quit smoking about 5 years ago. His smoking use included Cigarettes. He started smoking about 60 years ago. He has a 28.00 pack-year smoking history. He has never used smokeless tobacco. He reports that he does not drink alcohol or use drugs.   ROS:  Please see the history of present  illness. Otherwise, complete review of systems is positive for hearing loss.  All other systems are reviewed and negative.   Physical Exam: VS:  BP 116/73   Pulse (!) 56   Ht 5\' 2"  (1.575 m)   Wt 173 lb 12.8 oz (78.8 kg)   SpO2 96%   BMI 31.79 kg/m , BMI Body mass index is 31.79 kg/m.  Wt Readings from Last 3 Encounters:  10/30/16 173 lb 12.8 oz (78.8 kg)  06/30/16 170 lb 6.4 oz (77.3 kg)  06/07/16 170 lb (77.1 kg)    General: Elderly male, appears comfortable at rest. HEENT: Conjunctiva and lids normal, oropharynx clear. Neck: Supple, no elevated JVP or carotid bruits, no thyromegaly. Lungs: Decreased breath sounds at the bases, nonlabored breathing at rest. Thorax: Well-healing sternal incision, no erythema or drainage. Cardiac: Regular rate and rhythm, no S3, no pericardial rub. Abdomen: Soft, nontender, bowel sounds present, no guarding or rebound. Extremities: No pitting edema,well-healing vein harvest site on the right. Skin: Warm and dry. Musculoskeletal: No kyphosis. Neuropsychiatric: Alert and oriented x3, affect grossly appropriate.  ECG: I personally reviewed the tracing from 05/23/2016 which showed sinus rhythm with PVCs and left bundle branch block.  Recent Labwork: 05/07/2016: ALT 26; AST 50 05/12/2016: Magnesium 2.6 05/23/2016: BUN 22; Creatinine, Ser 1.44; Hemoglobin 11.3; Platelets 594; Potassium 4.9; Sodium 139     Component Value Date/Time   CHOL 170 11/02/2014 0213   TRIG 121 11/02/2014 0213   HDL 45 11/02/2014 0213   CHOLHDL 3.8 11/02/2014 0213   VLDL 24 11/02/2014 0213   LDLCALC 101 (H) 11/02/2014 0213    Other Studies Reviewed Today:  Echocardiogram 09/21/2016: Study Conclusions  - Left ventricle: The cavity size was mildly dilated. Wall   thickness was increased in a pattern of mild LVH. The estimated   ejection fraction was 40%. There is akinesis of the   basal-midinferior and inferoseptal myocardium. Doppler parameters   are consistent  with abnormal left ventricular relaxation (grade 1   diastolic dysfunction). - Ventricular septum: Septal motion showed paradox. - Aortic valve: Mildly calcified annulus. Trileaflet; mildly   calcified leaflets. There was mild stenosis. There was mild   regurgitation. Mean gradient (S): 12 mm Hg. Peak gradient (S): 25   mm Hg. VTI ratio of LVOT to aortic valve: 0.42. Valve area (VTI):   1.6 cm^2. - Aortic root: The aortic root was mildly ectatic. - Mitral valve: Calcified annulus. There was mild regurgitation. - Left atrium: The atrium was moderately dilated. - Right ventricle: Systolic function was mildly reduced. - Right atrium: Central venous pressure (est): 3 mm Hg. - Atrial septum: No defect or patent foramen ovale was identified. - Tricuspid valve: There was mild regurgitation. - Pulmonary arteries: PA peak pressure: 29 mm Hg (S). - Pericardium, extracardiac: There was no pericardial effusion.  Impressions:  - Mild LVH with mild chamber dilatation and LVEF approximately 40%   (43% by speckle tracking biplane imaging). Grade  1 diastolic   dysfunction. Septal motion consistent with left bundle branch   block. Moderate left atrial enlargement. Mitral annular   calcification with mild mitral regurgitation. Mildly ectatic   aortic root. Mild calcific aortic stenosis with mild mitral   regurgitation. Mildly reduced right ventricular contraction. Mild   tricuspid regurgitation with PASP 29 mmHg.  Assessment and Plan:  1. Symptomatically stable multivessel CAD status post CABG in November 2017. We talked about a more consistent walking plan for exercise. Otherwise we'll continue medical therapy and observation.  2. Ischemic cardiomyopathy with LVEF 40-45%, stable by recent follow-up echocardiogram. He has not required a standing diuretic.  3. History of bradycardia and left bundle branch block, not on beta blocker at this time.  4. Hyperlipidemia, continues on statin therapy.  Follow-up lab work with Dr. Olena Leatherwood.  Current medicines were reviewed with the patient today.  Disposition: Follow-up in 6 months.   Signed, Jonelle Sidle, MD, [facility] 10/30/2016 12:00 PM    [facility] at [facility] [address] [place], [place], [place] [address] Phone: [phone]; Fax: [phone]

## 2016-10-30 ENCOUNTER — Encounter: Payer: Self-pay | Admitting: Cardiology

## 2016-10-30 ENCOUNTER — Ambulatory Visit (INDEPENDENT_AMBULATORY_CARE_PROVIDER_SITE_OTHER): Payer: Medicare PPO | Admitting: Cardiology

## 2016-10-30 VITALS — BP 116/73 | HR 56 | Ht 62.0 in | Wt 173.8 lb

## 2016-10-30 DIAGNOSIS — R001 Bradycardia, unspecified: Secondary | ICD-10-CM

## 2016-10-30 DIAGNOSIS — I25709 Atherosclerosis of coronary artery bypass graft(s), unspecified, with unspecified angina pectoris: Secondary | ICD-10-CM | POA: Diagnosis not present

## 2016-10-30 DIAGNOSIS — E782 Mixed hyperlipidemia: Secondary | ICD-10-CM

## 2016-10-30 DIAGNOSIS — I255 Ischemic cardiomyopathy: Secondary | ICD-10-CM

## 2016-10-30 NOTE — Patient Instructions (Signed)

## 2016-11-13 DIAGNOSIS — K210 Gastro-esophageal reflux disease with esophagitis: Secondary | ICD-10-CM | POA: Diagnosis not present

## 2016-11-13 DIAGNOSIS — I2584 Coronary atherosclerosis due to calcified coronary lesion: Secondary | ICD-10-CM | POA: Diagnosis not present

## 2016-11-13 DIAGNOSIS — E1121 Type 2 diabetes mellitus with diabetic nephropathy: Secondary | ICD-10-CM | POA: Diagnosis not present

## 2016-11-13 DIAGNOSIS — M542 Cervicalgia: Secondary | ICD-10-CM | POA: Diagnosis not present

## 2016-11-13 DIAGNOSIS — I10 Essential (primary) hypertension: Secondary | ICD-10-CM | POA: Diagnosis not present

## 2016-11-14 ENCOUNTER — Other Ambulatory Visit: Payer: Self-pay | Admitting: Cardiology

## 2017-01-12 DIAGNOSIS — K210 Gastro-esophageal reflux disease with esophagitis: Secondary | ICD-10-CM | POA: Diagnosis not present

## 2017-01-12 DIAGNOSIS — I2584 Coronary atherosclerosis due to calcified coronary lesion: Secondary | ICD-10-CM | POA: Diagnosis not present

## 2017-01-12 DIAGNOSIS — M542 Cervicalgia: Secondary | ICD-10-CM | POA: Diagnosis not present

## 2017-01-12 DIAGNOSIS — I10 Essential (primary) hypertension: Secondary | ICD-10-CM | POA: Diagnosis not present

## 2017-01-12 DIAGNOSIS — E1121 Type 2 diabetes mellitus with diabetic nephropathy: Secondary | ICD-10-CM | POA: Diagnosis not present

## 2017-01-15 DIAGNOSIS — I10 Essential (primary) hypertension: Secondary | ICD-10-CM | POA: Diagnosis not present

## 2017-01-15 DIAGNOSIS — H2513 Age-related nuclear cataract, bilateral: Secondary | ICD-10-CM | POA: Diagnosis not present

## 2017-01-15 DIAGNOSIS — H11823 Conjunctivochalasis, bilateral: Secondary | ICD-10-CM | POA: Diagnosis not present

## 2017-01-15 DIAGNOSIS — E119 Type 2 diabetes mellitus without complications: Secondary | ICD-10-CM | POA: Diagnosis not present

## 2017-01-15 DIAGNOSIS — I2584 Coronary atherosclerosis due to calcified coronary lesion: Secondary | ICD-10-CM | POA: Diagnosis not present

## 2017-01-15 DIAGNOSIS — K210 Gastro-esophageal reflux disease with esophagitis: Secondary | ICD-10-CM | POA: Diagnosis not present

## 2017-01-15 DIAGNOSIS — Z6831 Body mass index (BMI) 31.0-31.9, adult: Secondary | ICD-10-CM | POA: Diagnosis not present

## 2017-01-15 DIAGNOSIS — E1121 Type 2 diabetes mellitus with diabetic nephropathy: Secondary | ICD-10-CM | POA: Diagnosis not present

## 2017-02-13 DIAGNOSIS — R3912 Poor urinary stream: Secondary | ICD-10-CM | POA: Diagnosis not present

## 2017-02-13 DIAGNOSIS — N401 Enlarged prostate with lower urinary tract symptoms: Secondary | ICD-10-CM | POA: Diagnosis not present

## 2017-02-13 DIAGNOSIS — R351 Nocturia: Secondary | ICD-10-CM | POA: Diagnosis not present

## 2017-02-13 DIAGNOSIS — R350 Frequency of micturition: Secondary | ICD-10-CM | POA: Diagnosis not present

## 2017-02-16 DIAGNOSIS — E1121 Type 2 diabetes mellitus with diabetic nephropathy: Secondary | ICD-10-CM | POA: Diagnosis not present

## 2017-02-16 DIAGNOSIS — M542 Cervicalgia: Secondary | ICD-10-CM | POA: Diagnosis not present

## 2017-02-16 DIAGNOSIS — I10 Essential (primary) hypertension: Secondary | ICD-10-CM | POA: Diagnosis not present

## 2017-02-16 DIAGNOSIS — K219 Gastro-esophageal reflux disease without esophagitis: Secondary | ICD-10-CM | POA: Diagnosis not present

## 2017-02-17 ENCOUNTER — Inpatient Hospital Stay (HOSPITAL_COMMUNITY)
Admission: EM | Admit: 2017-02-17 | Discharge: 2017-02-18 | DRG: 313 | Disposition: A | Discharge status: Home / Self Care | Payer: Medicare Other | Attending: Internal Medicine | Admitting: Internal Medicine

## 2017-02-17 ENCOUNTER — Encounter (HOSPITAL_COMMUNITY): Payer: Self-pay | Admitting: Emergency Medicine

## 2017-02-17 ENCOUNTER — Emergency Department (HOSPITAL_COMMUNITY): Payer: Medicare Other

## 2017-02-17 DIAGNOSIS — E785 Hyperlipidemia, unspecified: Secondary | ICD-10-CM | POA: Diagnosis present

## 2017-02-17 DIAGNOSIS — R0789 Other chest pain: Secondary | ICD-10-CM | POA: Diagnosis not present

## 2017-02-17 DIAGNOSIS — Z7902 Long term (current) use of antithrombotics/antiplatelets: Secondary | ICD-10-CM | POA: Diagnosis not present

## 2017-02-17 DIAGNOSIS — I447 Left bundle-branch block, unspecified: Secondary | ICD-10-CM | POA: Diagnosis present

## 2017-02-17 DIAGNOSIS — E119 Type 2 diabetes mellitus without complications: Secondary | ICD-10-CM | POA: Diagnosis not present

## 2017-02-17 DIAGNOSIS — Z79899 Other long term (current) drug therapy: Secondary | ICD-10-CM

## 2017-02-17 DIAGNOSIS — I2510 Atherosclerotic heart disease of native coronary artery without angina pectoris: Secondary | ICD-10-CM | POA: Diagnosis not present

## 2017-02-17 DIAGNOSIS — E1169 Type 2 diabetes mellitus with other specified complication: Secondary | ICD-10-CM

## 2017-02-17 DIAGNOSIS — Z951 Presence of aortocoronary bypass graft: Secondary | ICD-10-CM

## 2017-02-17 DIAGNOSIS — I1310 Hypertensive heart and chronic kidney disease without heart failure, with stage 1 through stage 4 chronic kidney disease, or unspecified chronic kidney disease: Secondary | ICD-10-CM | POA: Diagnosis not present

## 2017-02-17 DIAGNOSIS — Z87891 Personal history of nicotine dependence: Secondary | ICD-10-CM | POA: Diagnosis not present

## 2017-02-17 DIAGNOSIS — Z8249 Family history of ischemic heart disease and other diseases of the circulatory system: Secondary | ICD-10-CM | POA: Diagnosis not present

## 2017-02-17 DIAGNOSIS — Z7982 Long term (current) use of aspirin: Secondary | ICD-10-CM | POA: Diagnosis not present

## 2017-02-17 DIAGNOSIS — R001 Bradycardia, unspecified: Secondary | ICD-10-CM | POA: Diagnosis present

## 2017-02-17 DIAGNOSIS — R079 Chest pain, unspecified: Secondary | ICD-10-CM | POA: Diagnosis present

## 2017-02-17 DIAGNOSIS — N182 Chronic kidney disease, stage 2 (mild): Secondary | ICD-10-CM | POA: Diagnosis present

## 2017-02-17 DIAGNOSIS — I255 Ischemic cardiomyopathy: Secondary | ICD-10-CM | POA: Diagnosis present

## 2017-02-17 DIAGNOSIS — E1122 Type 2 diabetes mellitus with diabetic chronic kidney disease: Secondary | ICD-10-CM | POA: Diagnosis present

## 2017-02-17 DIAGNOSIS — Z794 Long term (current) use of insulin: Secondary | ICD-10-CM

## 2017-02-17 DIAGNOSIS — R072 Precordial pain: Secondary | ICD-10-CM | POA: Diagnosis not present

## 2017-02-17 DIAGNOSIS — R012 Other cardiac sounds: Secondary | ICD-10-CM | POA: Diagnosis present

## 2017-02-17 LAB — CBC
HCT: 37.8 % — ABNORMAL LOW (ref 39.0–52.0)
HCT: 37.8 % — ABNORMAL LOW (ref 39.0–52.0)
HEMOGLOBIN: 11.6 g/dL — AB (ref 13.0–17.0)
Hemoglobin: 11.7 g/dL — ABNORMAL LOW (ref 13.0–17.0)
MCH: 26.4 pg (ref 26.0–34.0)
MCH: 26.3 pg (ref 26.0–34.0)
MCHC: 31 g/dL (ref 30.0–36.0)
MCHC: 30.7 g/dL (ref 30.0–36.0)
MCV: 85.1 fL (ref 78.0–100.0)
MCV: 85.7 fL (ref 78.0–100.0)
PLATELETS: 249 10*3/uL (ref 150–400)
PLATELETS: 243 10*3/uL (ref 150–400)
RBC: 4.44 MIL/uL (ref 4.22–5.81)
RBC: 4.41 MIL/uL (ref 4.22–5.81)
RDW: 16.4 % — AB (ref 11.5–15.5)
RDW: 16.5 % — ABNORMAL HIGH (ref 11.5–15.5)
WBC: 5.5 10*3/uL (ref 4.0–10.5)
WBC: 5.7 10*3/uL (ref 4.0–10.5)

## 2017-02-17 LAB — BASIC METABOLIC PANEL
Anion gap: 5 (ref 5–15)
BUN: 15 mg/dL (ref 6–20)
CALCIUM: 10.1 mg/dL (ref 8.9–10.3)
CO2: 26 mmol/L (ref 22–32)
CREATININE: 1.22 mg/dL (ref 0.61–1.24)
Chloride: 111 mmol/L (ref 101–111)
GFR calc Af Amer: >60 mL/min (ref >60)
GFR, EST NON AFRICAN AMERICAN: 54 mL/min — AB (ref >60)
GLUCOSE: 116 mg/dL — AB (ref 65–99)
Potassium: 4.3 mmol/L (ref 3.5–5.1)
SODIUM: 142 mmol/L (ref 135–145)

## 2017-02-17 LAB — GLUCOSE, CAPILLARY: Glucose-Capillary: 124 mg/dL — ABNORMAL HIGH (ref 65–99)

## 2017-02-17 LAB — TROPONIN I: Troponin I: <0.03 ng/mL (ref <0.03)

## 2017-02-17 LAB — CREATININE, SERUM
CREATININE: 1.24 mg/dL (ref 0.61–1.24)
GFR calc Af Amer: >60 mL/min (ref >60)
GFR, EST NON AFRICAN AMERICAN: 53 mL/min — AB (ref >60)

## 2017-02-17 MED ORDER — SODIUM CHLORIDE 0.9% FLUSH: 3.0000 mL | INTRAVENOUS | Status: DC | PRN

## 2017-02-17 MED ORDER — ASPIRIN 81 MG PO CHEW
324.0000 mg | CHEWABLE_TABLET | Freq: Once | ORAL | Status: AC
  Administered 2017-02-17: 243 mg via ORAL
  Filled 2017-02-17: qty 4

## 2017-02-17 MED ORDER — ASPIRIN EC 81 MG PO TBEC
81.0000 mg | DELAYED_RELEASE_TABLET | Freq: Every day | ORAL | Status: DC
  Administered 2017-02-18: 81 mg via ORAL
  Filled 2017-02-17: qty 1

## 2017-02-17 MED ORDER — AMLODIPINE BESYLATE 5 MG PO TABS
10.0000 mg | ORAL_TABLET | Freq: Every day | ORAL | Status: DC
  Administered 2017-02-17 – 2017-02-18 (×2): 10 mg via ORAL
  Filled 2017-02-17 (×2): qty 2

## 2017-02-17 MED ORDER — ATORVASTATIN CALCIUM 40 MG PO TABS: 80.0000 mg | ORAL_TABLET | Freq: Every day | ORAL | Status: DC

## 2017-02-17 MED ORDER — ENOXAPARIN SODIUM 40 MG/0.4ML ~~LOC~~ SOLN
40.0000 mg | SUBCUTANEOUS | Status: DC
  Administered 2017-02-17: 40 mg via SUBCUTANEOUS
  Filled 2017-02-17: qty 0.4

## 2017-02-17 MED ORDER — INSULIN GLARGINE 100 UNIT/ML SOLOSTAR PEN: 10.0000 [IU] | PEN_INJECTOR | Freq: Every day | SUBCUTANEOUS | Status: DC

## 2017-02-17 MED ORDER — INSULIN GLARGINE 100 UNIT/ML ~~LOC~~ SOLN
10.0000 [IU] | Freq: Every day | SUBCUTANEOUS | Status: DC
  Administered 2017-02-17: 10 [IU] via SUBCUTANEOUS
  Filled 2017-02-17 (×2): qty 0.1

## 2017-02-17 MED ORDER — SODIUM CHLORIDE 0.9% FLUSH
3.0000 mL | Freq: Two times a day (BID) | INTRAVENOUS | Status: DC
  Administered 2017-02-17 – 2017-02-18 (×2): 3 mL via INTRAVENOUS

## 2017-02-17 MED ORDER — LISINOPRIL 5 MG PO TABS
5.0000 mg | ORAL_TABLET | Freq: Every day | ORAL | Status: DC
  Administered 2017-02-18: 5 mg via ORAL
  Filled 2017-02-17: qty 1

## 2017-02-17 MED ORDER — SODIUM CHLORIDE 0.9 % IV SOLN: 250.0000 mL | INTRAVENOUS | Status: DC | PRN

## 2017-02-17 MED ORDER — ACETAMINOPHEN 325 MG PO TABS: 650.0000 mg | ORAL_TABLET | Freq: Four times a day (QID) | ORAL | Status: DC | PRN

## 2017-02-17 MED ORDER — INSULIN ASPART 100 UNIT/ML ~~LOC~~ SOLN
0.0000 [IU] | Freq: Three times a day (TID) | SUBCUTANEOUS | Status: DC
Start: 2017-02-18 — End: 2017-02-18

## 2017-02-17 MED ORDER — ONDANSETRON HCL 4 MG/2ML IJ SOLN: 4.0000 mg | Freq: Four times a day (QID) | INTRAMUSCULAR | Status: DC | PRN

## 2017-02-17 MED ORDER — CLOPIDOGREL BISULFATE 75 MG PO TABS
75.0000 mg | ORAL_TABLET | Freq: Every day | ORAL | Status: DC
  Administered 2017-02-18: 75 mg via ORAL
  Filled 2017-02-17: qty 1

## 2017-02-17 MED ORDER — ACETAMINOPHEN 650 MG RE SUPP: 650.0000 mg | Freq: Four times a day (QID) | RECTAL | Status: DC | PRN

## 2017-02-17 MED ORDER — ONDANSETRON HCL 4 MG PO TABS: 4.0000 mg | ORAL_TABLET | Freq: Four times a day (QID) | ORAL | Status: DC | PRN

## 2017-02-17 MED ORDER — PANTOPRAZOLE SODIUM 20 MG PO TBEC
20.0000 mg | DELAYED_RELEASE_TABLET | Freq: Every day | ORAL | Status: DC
  Filled 2017-02-17 (×2): qty 1

## 2017-02-17 NOTE — Progress Notes (Signed)
Dr. Antionette Charpyd paged and made aware pts HR keeps dropping down to the 30s. Pt asymptomatic in room, no c/o Cp. Will continue to monitor pt

## 2017-02-17 NOTE — ED Provider Notes (Signed)
[facility]-EMER[facility]ENCY DEPT Provider Note   CSN: [id number] Arrival date & time: 02/17/17  1301     History   Chief Complaint Chief Complaint  Patient presents with  . Chest Pain    HPI Allen [name] is a 83 y.o. male.  HPI  The patient is an 83 year old male, he is well known to the cardiology service because of his history of coronary artery disease status post known diagnosis of ischemic cardiomyopathy with an ejection fraction of around 40%, history of coronary artery bypass grafting in November 2017 with 5 vessel bypass, has done fairly well and when he followed up in May 2018 with the cardiologist, Dr. Diona BrownerMcDowell he was doing well with no exertional symptoms. The patient reports that since that time he has been overall very well until last night when he started to develop some left-sided intermittent chest pain. He states that last for several minutes, then it resolves, it is not positional, he has not done any exertion, it is not associated with shortness of breath nausea vomiting diaphoresis or coughing. There is no radiation of the pain, it stays in the left mid to lower chest. He has no associated swelling of the legs but does have chronic numbness of his right leg after vein harvesting for his bypass. He has been compliant with his medications. Of note the patient does have a left bundle branch block and a mild bradycardia and beta blockers have been contraindicated thus far  Past Medical History:  Diagnosis Date  . CAD (coronary artery disease)    Multivessel obstructive CAD involving the RCA, circumflex, and obtuse marginal system with only mild LAD disease May 2016  . Diabetes mellitus without complication (HCC)   . Essential hypertension   . Hyperlipidemia   . Ischemic cardiomyopathy    LVEF 35% May 2016  . LBBB (left bundle branch block)   . Sinus bradycardia     Patient Active Problem List   Diagnosis Date Noted  . Chest pain 02/17/2017  . S/P CABG x 5 05/08/2016  .  NSTEMI (non-ST elevated myocardial infarction) (HCC) 05/03/2016  . Ischemic cardiomyopathy 11/26/2014  . CAD (coronary artery disease), native coronary artery 11/24/2014  . LBBB (left bundle branch block) 10/23/2014  . Mitral regurgitation 09/19/2013  . Shortness of breath 08/29/2013  . Dizziness   . Aortic valve sclerosis   . Aortic insufficiency   . Left ventricular hypertrophy   . Ejection fraction   . Abnormal chest x-ray   . Hypertension   . Hyperlipidemia   . Tobacco abuse   . Chronic kidney disease   . Sinus bradycardia   . Obstructive sleep apnea     Past Surgical History:  Procedure Laterality Date  . CARDIAC CATHETERIZATION N/A 11/02/2014   Procedure: Left Heart Cath and Coronary Angiography;  Surgeon: Lyn RecordsHenry W Smith, MD;  [facility];  Service: Cardiovascular;  Laterality: N/A;  . CARDIAC CATHETERIZATION N/A 05/03/2016   Procedure: Left Heart Cath and Coronary Angiography;  Surgeon: Peter M SwazilandJordan, MD;  [facility];  Service: Cardiovascular;  Laterality: N/A;  . CORONARY ARTERY BYPASS GRAFT N/A 05/08/2016   Procedure: CORONARY ARTERY BYPASS GRAFTING (CABG) x 5;  Surgeon: Alleen BorneBryan K Bartle, MD;  Location: [facility] OR;  Service: Open Heart Surgery;  Laterality: N/A;  . ENDOVEIN HARVEST OF GREATER SAPHENOUS VEIN  05/08/2016   Procedure: ENDOVEIN HARVEST OF GREATER SAPHENOUS VEIN;  Surgeon: Alleen BorneBryan K Bartle, MD;  Location: [facility] OR;  Service: Open  Heart Surgery;;  . TEE WITHOUT CARDIOVERSION N/A 05/08/2016   Procedure: TRANSESOPHAGEAL ECHOCARDIOGRAM (TEE);  Surgeon: Alleen Borne, MD;  Location: [facility] OR;  Service: Open Heart Surgery;  Laterality: N/A;       Home Medications    Prior to Admission medications   Medication Sig Start Date End Date Taking? Authorizing Provider  amLODipine (NORVASC) 10 MG tablet TAKE 1 TABLET ONE TIME DAILY 11/15/16  Yes Jonelle Sidle, MD  aspirin 81 MG EC tablet Take 81 mg by mouth daily.   Yes [provider]    atorvastatin (LIPITOR) 80 MG tablet Take 1 tablet (80 mg total) by mouth daily at 6 PM. 11/26/14  Yes Luis Abed, MD  clopidogrel (PLAVIX) 75 MG tablet TAKE 1 TABLET ONE TIME DAILY WITH BREAKFAST 11/15/16  Yes Jonelle Sidle, MD  glimepiride (AMARYL) 2 MG tablet Take 2 mg by mouth 2 (two) times daily. 06/10/13  Yes [provider]  Insulin Glargine (LANTUS SOLOSTAR) 100 UNIT/ML Solostar Pen Inject 20 Units into the skin at bedtime. 05/16/16  Yes Gold, Wayne E, PA-C  lisinopril (PRINIVIL,ZESTRIL) 5 MG tablet TAKE 1 TABLET (5 MG TOTAL) BY MOUTH DAILY. 11/15/16  Yes Jonelle Sidle, MD  pantoprazole (PROTONIX) 20 MG tablet Take 1 tablet (20 mg total) by mouth daily. 07/10/16  Yes Jonelle Sidle, MD    Family History Family History  Problem Relation Age of Onset  . Coronary artery disease Brother     Social History Social History  Substance Use Topics  . Smoking status: Former Smoker    Packs/day: 0.50    Years: 56.00    Types: Cigarettes    Start date: 02/18/1956    Quit date: 06/13/2011  . Smokeless tobacco: Never Used  . Alcohol use No     Allergies   Patient has no known allergies.   Review of Systems Review of Systems  All other systems reviewed and are negative.    Physical Exam Updated Vital Signs BP 119/73   Pulse (!) 50   Temp 98.2 F (36.8 C) (Oral)   Resp 14   Ht  (1.575 m)   Wt 78 kg (172 lb)   SpO2 98%   BMI 31.46 kg/m   Physical Exam  Constitutional: He appears well-developed and well-nourished. No distress.  HENT:  Head: Normocephalic and atraumatic.  Mouth/Throat: Oropharynx is clear and moist. No oropharyngeal exudate.  Eyes: Pupils are equal, round, and reactive to light. Conjunctivae and EOM are normal. Right eye exhibits no discharge. Left eye exhibits no discharge. No scleral icterus.  Neck: Normal range of motion. Neck supple. No JVD present. No thyromegaly present.  Cardiovascular: Regular rhythm and intact distal  pulses.  Exam reveals friction rub. Exam reveals no gallop.   No murmur heard. mild bradycardia The patient has a new abnormal sound during systole, consistent with a rub  Pulmonary/Chest: Effort normal and breath sounds normal. No respiratory distress. He has no wheezes. He has no rales.  Abdominal: Soft. Bowel sounds are normal. He exhibits no distension and no mass. There is no tenderness.  Musculoskeletal: Normal range of motion. He exhibits no edema or tenderness.  Lymphadenopathy:    He has no cervical adenopathy.  Neurological: He is alert. Coordination normal.  Skin: Skin is warm and dry. No rash noted. No erythema.  Psychiatric: He has a normal mood and affect. His behavior is normal.  Nursing note and vitals reviewed.    ED Treatments / Results  Labs (all labs ordered are listed, but only abnormal results are displayed) Labs Reviewed  BASIC METABOLIC PANEL - Abnormal; Notable for the following:       Result Value   Glucose, Bld 116 (*)    GFR calc non Af Amer 54 (*)    All other components within normal limits  CBC - Abnormal; Notable for the following:    Hemoglobin 11.7 (*)    HCT 37.8 (*)    RDW 16.4 (*)    All other components within normal limits  TROPONIN I    EKG  EKG Interpretation  Date/Time:  Saturday February 17 2017 13:21:12 EDT Ventricular Rate:  57 PR Interval:    QRS Duration: 176 QT Interval:  446 QTC Calculation: 435 R Axis:   13 Text Interpretation:  Sinus rhythm Left bundle branch block since last tracing no significant change Confirmed by Eber Hong ([id number]) on 02/17/2017 1:23:47 PM       Radiology Dg Chest 2 View  Result Date: 02/17/2017 CLINICAL DATA:  Chest pain EXAM: CHEST  2 VIEW COMPARISON:  06/07/2016 FINDINGS: Postop CABG. Cardiac enlargement without heart failure. Lungs are clear without infiltrate or effusion. IMPRESSION: No active cardiopulmonary disease. Electronically Signed   By: Marlan Palau M.D.   On: 02/17/2017 14:00      Procedures Procedures (including critical care time)  Medications Ordered in ED Medications  aspirin chewable tablet 324 mg (243 mg Oral Given 02/17/17 1341)     Initial Impression / Assessment and Plan / ED Course  I have reviewed the triage vital signs and the nursing notes.  Pertinent labs & imaging results that were available during my care of the patient were reviewed by me and considered in my medical decision making (see chart for details).     Review of prior electronic medical record reports and office visit summaries going back 1 year shows that he has never had any murmurs rubs or gallops. His echocardiogram did show mild aortic stenosis and some regurgitation though these were both mild. He now has new chest pain with a new sound which could be consistent with a rub. He does not appear to be in distress, his EKG is unchanged and shows a left bundle branch block with a mild bradycardia. We'll consult with cardiology after labs have returned.  Has stayed mildly bradycardic Labs are reassuring but with new rub I discussed with Dr. Darl Householder with cardiology and with hospitalist Dr. Ella Jubilee, the latter of who will admit.  Vitals:   02/17/17 1319 02/17/17 1321  BP:  139/89  Pulse:  (!) 56  Resp:  18  Temp:  98.2 F (36.8 C)  TempSrc:  Oral  SpO2:  97%  Weight: 78 kg (172 lb)   Height:  (1.575 m)      Final Clinical Impressions(s) / ED Diagnoses   Final diagnoses:  Left sided chest pain  Pericardial rub    New Prescriptions New Prescriptions   No medications on file     Eber Hong, MD 02/17/17 1542

## 2017-02-17 NOTE — ED Triage Notes (Signed)
Pt reports shooting pain through chest with no associated sob, dizziness, n/v.  Reports intermittent in nature since last night with no aggravating or relieving factors.

## 2017-02-17 NOTE — H&P (Addendum)
History and Physical    Allen PuttSamuel J Luevanos ZOX:[id number] DOB: 01/07/1934 DOA: 02/17/2017  PCP: Toma DeitersHasanaj, Xaje A, MD   Patient coming from: Home   Chief Complaint: Chest pain.   HPI: Allen [name] is a 83 y.o. male with medical history significant of coronary artery disease status post coronary artery bypass grafting x5, November 2017. Patient presents with acute episode of left chest pain, ongoing since last night, intermittent and sharp in nature, can get up to a 8 out of 10 in intensity, is not exertional related, not pleuritic in characteristics. Worse with movement of the left upper extremity and leaning forward. No improving factors. No other associated symptoms, no dyspnea, no diaphoresis. Patient was able to sleep despite the intermittent chest pain, this morning the pain was still present, he decided to come to the emergency room for evaluation. Last follow-up with cardiology was in May 2018 no cardiac rub reported, his ejection fraction was 40-45% on the left ventricle, stable, recommendations to follow-up in 6 months.    ED Course: Initial cardiac enzymes and EKG negative for ischemia, noted pericardial rub, referred for admission and further evaluation.   Review of Systems:  1. General: No fevers, no chills, no weight gain or weight loss 2. ENT: No runny nose or sore throat, no hearing disturbances 3. Pulmonary: No dyspnea, cough, wheezing, or hemoptysis 4. Cardiovascular: No angina, claudication, lower extremity edema, pnd or orthopnea 5. Gastrointestinal: No nausea or vomiting, no diarrhea or constipation 6. Hematology: No easy bruisability or frequent infections 7. Urology: No dysuria, hematuria or increased urinary frequency 8. Dermatology: No rashes. 9. Neurology: No seizures or paresthesias 10. Musculoskeletal: No joint pain or deformities  Past Medical History:  Diagnosis Date  . CAD (coronary artery disease)    Multivessel obstructive CAD involving the RCA, circumflex,  and obtuse marginal system with only mild LAD disease May 2016  . Diabetes mellitus without complication (HCC)   . Essential hypertension   . Hyperlipidemia   . Ischemic cardiomyopathy    LVEF 35% May 2016  . LBBB (left bundle branch block)   . Sinus bradycardia     Past Surgical History:  Procedure Laterality Date  . CARDIAC CATHETERIZATION N/A 11/02/2014   Procedure: Left Heart Cath and Coronary Angiography;  Surgeon: Lyn RecordsHenry W Smith, MD;  [facility];  Service: Cardiovascular;  Laterality: N/A;  . CARDIAC CATHETERIZATION N/A 05/03/2016   Procedure: Left Heart Cath and Coronary Angiography;  Surgeon: Peter M SwazilandJordan, MD;  [facility];  Service: Cardiovascular;  Laterality: N/A;  . CORONARY ARTERY BYPASS GRAFT N/A 05/08/2016   Procedure: CORONARY ARTERY BYPASS GRAFTING (CABG) x 5;  Surgeon: Alleen BorneBryan K Bartle, MD;  Location: [facility] OR;  Service: Open Heart Surgery;  Laterality: N/A;  . ENDOVEIN HARVEST OF GREATER SAPHENOUS VEIN  05/08/2016   Procedure: ENDOVEIN HARVEST OF GREATER SAPHENOUS VEIN;  Surgeon: Alleen BorneBryan K Bartle, MD;  Location: [facility] OR;  Service: Open Heart Surgery;;  . TEE WITHOUT CARDIOVERSION N/A 05/08/2016   Procedure: TRANSESOPHAGEAL ECHOCARDIOGRAM (TEE);  Surgeon: Alleen BorneBryan K Bartle, MD;  [facility];  Service: Open Heart Surgery;  Laterality: N/A;     reports that he quit smoking about 5 years ago. His smoking use included Cigarettes. He started smoking about 61 years ago. He has a 28.00 pack-year smoking history. He has never used smokeless tobacco. He reports that he does not drink alcohol or use drugs.  No Known Allergies  Family History  Problem Relation  Age of Onset  . Coronary artery disease Brother    Unacceptable: Noncontributory, unremarkable, or negative. Acceptable: Family history reviewed and not pertinent (If you reviewed it)  Prior to Admission medications   Medication Sig Start Date End Date Taking? Authorizing Provider    amLODipine (NORVASC) 10 MG tablet TAKE 1 TABLET ONE TIME DAILY 11/15/16  Yes Jonelle Sidle, MD  aspirin 81 MG EC tablet Take 81 mg by mouth daily.   Yes [provider]  atorvastatin (LIPITOR) 80 MG tablet Take 1 tablet (80 mg total) by mouth daily at 6 PM. 11/26/14  Yes Luis Abed, MD  clopidogrel (PLAVIX) 75 MG tablet TAKE 1 TABLET ONE TIME DAILY WITH BREAKFAST 11/15/16  Yes Jonelle Sidle, MD  glimepiride (AMARYL) 2 MG tablet Take 2 mg by mouth 2 (two) times daily. 06/10/13  Yes [provider]  Insulin Glargine (LANTUS SOLOSTAR) 100 UNIT/ML Solostar Pen Inject 20 Units into the skin at bedtime. 05/16/16  Yes Gold, Wayne E, PA-C  lisinopril (PRINIVIL,ZESTRIL) 5 MG tablet TAKE 1 TABLET (5 MG TOTAL) BY MOUTH DAILY. 11/15/16  Yes Jonelle Sidle, MD  pantoprazole (PROTONIX) 20 MG tablet Take 1 tablet (20 mg total) by mouth daily. 07/10/16  Yes Jonelle Sidle, MD    Physical Exam: not in pain or dyspnea Vitals:   02/17/17 1319 02/17/17 1321 02/17/17 1430  BP:  139/89 119/73  Pulse:  (!) 56 (!) 50  Resp:  18 14  Temp:  98.2 F (36.8 C)   TempSrc:  Oral   SpO2:  97% 98%  Weight: 78 kg (172 lb)    Height:  (1.575 m)      Constitutional: Not in pain or dyspnea Vitals:   02/17/17 1319 02/17/17 1321 02/17/17 1430  BP:  139/89 119/73  Pulse:  (!) 56 (!) 50  Resp:  18 14  Temp:  98.2 F (36.8 C)   TempSrc:  Oral   SpO2:  97% 98%  Weight: 78 kg (172 lb)    Height:  (1.575 m)     Eyes: PERRL, lids and conjunctivae normal Head Normocephalic, nose and ears no deformities.  ENMT: Mucous membranes are moist. Posterior pharynx clear of any exudate or lesions.Normal dentition.  Neck: normal, supple, no masses, no thyromegaly Respiratory: clear to auscultation bilaterally, no wheezing, no crackles. Normal respiratory effort. No accessory muscle use.  Cardiovascular: Regular rate and rhythm, no murmurs, gallops. Positive systolic rub. No extremity  edema. 2+ pedal pulses. No carotid bruits.  Abdomen: no tenderness, no masses palpated. No hepatosplenomegaly. Bowel sounds positive.  Musculoskeletal: no clubbing / cyanosis. No joint deformity upper and lower extremities. Good ROM, no contractures. Normal muscle tone.  Skin: no rashes, lesions, ulcers. No induration Neurologic: CN 2-12 grossly intact. Sensation intact, DTR normal. Strength 5/5 in all 4.     Labs on Admission: I have personally reviewed following labs and imaging studies  CBC:  Recent Labs Lab 02/17/17 1325  WBC 5.5  HGB 11.7*  HCT 37.8*  MCV 85.1  PLT 249   Basic Metabolic Panel:  Recent Labs Lab 02/17/17 1325  NA 142  K 4.3  CL 111  CO2 26  GLUCOSE 116*  BUN 15  CREATININE 1.22  CALCIUM 10.1   GFR: Estimated Creatinine Clearance: 43 mL/min (by C-G formula based on SCr of 1.22 mg/dL). Liver Function Tests: No results for input(s): AST, ALT, ALKPHOS, BILITOT, PROT, ALBUMIN in the last 168 hours. No results for input(s):  LIPASE, AMYLASE in the last 168 hours. No results for input(s): AMMONIA in the last 168 hours. Coagulation Profile: No results for input(s): INR, PROTIME in the last 168 hours. Cardiac Enzymes:  Recent Labs Lab 02/17/17 1325  TROPONINI <0.03   BNP (last 3 results) No results for input(s): PROBNP in the last 8760 hours. HbA1C: No results for input(s): HGBA1C in the last 72 hours. CBG: No results for input(s): GLUCAP in the last 168 hours. Lipid Profile: No results for input(s): CHOL, HDL, LDLCALC, TRIG, CHOLHDL, LDLDIRECT in the last 72 hours. Thyroid Function Tests: No results for input(s): TSH, T4TOTAL, FREET4, T3FREE, THYROIDAB in the last 72 hours. Anemia Panel: No results for input(s): VITAMINB12, FOLATE, FERRITIN, TIBC, IRON, RETICCTPCT in the last 72 hours. Urine analysis:    Component Value Date/Time   COLORURINE YELLOW 05/07/2016 2330   APPEARANCEUR CLEAR 05/07/2016 2330   LABSPEC 1.014 05/07/2016 2330    PHURINE 6.5 05/07/2016 2330   GLUCOSEU NEGATIVE 05/07/2016 2330   HGBUR NEGATIVE 05/07/2016 2330   BILIRUBINUR NEGATIVE 05/07/2016 2330   KETONESUR NEGATIVE 05/07/2016 2330   PROTEINUR NEGATIVE 05/07/2016 2330   NITRITE NEGATIVE 05/07/2016 2330   LEUKOCYTESUR NEGATIVE 05/07/2016 2330    Radiological Exams on Admission: Dg Chest 2 View  Result Date: 02/17/2017 CLINICAL DATA:  Chest pain EXAM: CHEST  2 VIEW COMPARISON:  06/07/2016 FINDINGS: Postop CABG. Cardiac enlargement without heart failure. Lungs are clear without infiltrate or effusion. IMPRESSION: No active cardiopulmonary disease. Electronically Signed   By: Marlan Palau M.D.   On: 02/17/2017 14:00    EKG: Independently reviewed. Normal sinus rhythm, chronic left bundle branch block, no ST elevations or ST depressions. Chronic lateral T-wave inversions  Assessment/Plan Active Problems:   Chest pain   This is a 83 year old male with history of coronary disease status post a recent coronary artery bypass grafting. No recent history of angina or heart failure. His pain has been ongoing for more than 12 hours, described as sharp, related to movement at this left upper extremity and leaning forward, nonpleuritic, nonexertional. On the physical examination his blood pressures 134/70, heart rate 48, respiratory rate 17, oxygen saturation 95% moist mucous membranes, lungs clear to auscultation, heart S1-S2 present and rhythmic, positive systolic rub, abdomen soft nontender, no enlargement edema. Sodium 142, potassium 4.3, chloride 111, bicarbonate 26, glucose 116, BUN 15, creatinine 1.22, white count 5.5, hemoglobin 11.7, hematocrit 37.8, platelets 249. Chest x-ray, personally reviewed, right rotation, normal inflation, good penetration, positive chronic cardiomegaly, no pleural effusions, no infiltrates or signs of pneumothorax.    The patient will be admitted to the hospital with the working diagnosis of atypical chest pain.  1. Atypical  chest pain, in the setting of a pericardial rub. Will admit patient to the telemetry unit, will request 3 sets of cardiac enzymes and serial EKGs, pain control with acetaminophen. The characteristics of the chest pain are not typical for pericarditis. His EKG has no ST elevations or PR depressions. Patient has chronic cardiomegaly. Will plan to continue observation over the next 24 hours, will order a limited echocardiogram to assess the development of any pericardial effusions, old records personally reviewed, echocardiogram from April 2018, with ejection fraction 40%, akinesis of the basal and inferior septal myocardium, with no pericardial effusion. It is possible that his pericardial rub might be related to post operative changes associated with sternotomy and bypass grafting.  2. Coronary artery disease. Will continue with dual-antiplatelet therapy with aspirin and clopidogrel, statin therapy with atorvastatin, currently chest  pain-free. No clinical signs of acute coronary syndrome. Continue ACE inhibitor, no beta blockade due to bradycardia.  3. Hypertension. Continue lisinopril 5 mg and amlodipine 10 mg for blood pressure control.  4. Type 2 diabetes mellitus. Continue glucose coverage and monitoring with insulin sliding scale, will continue insulin glargine at 10 units qhs, to prevent hypoglycemia. Hold on oral hypoglycemic agents for now.    DVT prophylaxis: enoxaparin Code Status: full  Family Communication: I spoke with patient's family at the bedside and all question were addressed.   Disposition Plan: home  Consults called:  None  Admission status: Observation.    Mauricio Annett Gula MD [facility] Pager [phone]  If 7PM-7AM, please contact night-coverage [email or web address] Password [facility]  02/17/2017, 3:57 PM

## 2017-02-18 ENCOUNTER — Observation Stay (HOSPITAL_COMMUNITY): Payer: Medicare Other

## 2017-02-18 DIAGNOSIS — E785 Hyperlipidemia, unspecified: Secondary | ICD-10-CM | POA: Diagnosis not present

## 2017-02-18 DIAGNOSIS — R012 Other cardiac sounds: Secondary | ICD-10-CM

## 2017-02-18 DIAGNOSIS — E119 Type 2 diabetes mellitus without complications: Secondary | ICD-10-CM | POA: Diagnosis not present

## 2017-02-18 DIAGNOSIS — R0789 Other chest pain: Secondary | ICD-10-CM | POA: Diagnosis not present

## 2017-02-18 DIAGNOSIS — Z951 Presence of aortocoronary bypass graft: Secondary | ICD-10-CM | POA: Diagnosis not present

## 2017-02-18 DIAGNOSIS — R079 Chest pain, unspecified: Secondary | ICD-10-CM

## 2017-02-18 DIAGNOSIS — I2510 Atherosclerotic heart disease of native coronary artery without angina pectoris: Secondary | ICD-10-CM | POA: Diagnosis not present

## 2017-02-18 DIAGNOSIS — I1310 Hypertensive heart and chronic kidney disease without heart failure, with stage 1 through stage 4 chronic kidney disease, or unspecified chronic kidney disease: Secondary | ICD-10-CM | POA: Diagnosis not present

## 2017-02-18 DIAGNOSIS — I255 Ischemic cardiomyopathy: Secondary | ICD-10-CM | POA: Diagnosis not present

## 2017-02-18 DIAGNOSIS — E1122 Type 2 diabetes mellitus with diabetic chronic kidney disease: Secondary | ICD-10-CM | POA: Diagnosis not present

## 2017-02-18 LAB — CBC
HEMATOCRIT: 36.9 % — AB (ref 39.0–52.0)
HEMOGLOBIN: 11.6 g/dL — AB (ref 13.0–17.0)
MCH: 26.7 pg (ref 26.0–34.0)
MCHC: 31.4 g/dL (ref 30.0–36.0)
MCV: 84.8 fL (ref 78.0–100.0)
PLATELETS: 249 10*3/uL (ref 150–400)
RBC: 4.35 MIL/uL (ref 4.22–5.81)
RDW: 16.3 % — AB (ref 11.5–15.5)
WBC: 5.1 10*3/uL (ref 4.0–10.5)

## 2017-02-18 LAB — TROPONIN I: Troponin I: <0.03 ng/mL (ref <0.03)

## 2017-02-18 LAB — COMPREHENSIVE METABOLIC PANEL
ALBUMIN: 3.8 g/dL (ref 3.5–5.0)
ALT: 20 U/L (ref 17–63)
ANION GAP: 7 (ref 5–15)
AST: 33 U/L (ref 15–41)
Alkaline Phosphatase: 59 U/L (ref 38–126)
BUN: 14 mg/dL (ref 6–20)
CO2: 27 mmol/L (ref 22–32)
Calcium: 10.5 mg/dL — ABNORMAL HIGH (ref 8.9–10.3)
Chloride: 109 mmol/L (ref 101–111)
Creatinine, Ser: 1.19 mg/dL (ref 0.61–1.24)
GFR calc Af Amer: >60 mL/min (ref >60)
GFR calc non Af Amer: 55 mL/min — ABNORMAL LOW (ref >60)
GLUCOSE: 101 mg/dL — AB (ref 65–99)
POTASSIUM: 4.1 mmol/L (ref 3.5–5.1)
Sodium: 143 mmol/L (ref 135–145)
Total Bilirubin: 0.9 mg/dL (ref 0.3–1.2)
Total Protein: 6.9 g/dL (ref 6.5–8.1)

## 2017-02-18 LAB — ECHOCARDIOGRAM LIMITED
HEIGHTINCHES: 62 in
WEIGHTICAEL: 2769.6 [oz_av]

## 2017-02-18 LAB — GLUCOSE, CAPILLARY
GLUCOSE-CAPILLARY: 82 mg/dL (ref 65–99)
Glucose-Capillary: 88 mg/dL (ref 65–99)
Glucose-Capillary: 102 mg/dL — ABNORMAL HIGH (ref 65–99)

## 2017-02-18 MED ORDER — PANTOPRAZOLE SODIUM 40 MG PO TBEC
40.0000 mg | DELAYED_RELEASE_TABLET | Freq: Every day | ORAL | Status: DC
  Administered 2017-02-18: 40 mg via ORAL
  Filled 2017-02-18: qty 1

## 2017-02-18 NOTE — Progress Notes (Signed)
  Echocardiogram 2D Echocardiogram has been performed.  Nakul Avino L Androw 02/18/2017, 3:28 PM

## 2017-02-18 NOTE — Discharge Summary (Addendum)
Physician Discharge Summary  Allen [name] EXB:[id number] DOB: Mar 10, 1934 DOA: 02/17/2017  PCP: [name], MD  Admit date: 02/17/2017 Discharge date: 02/18/2017  Time spent: 45 minutes  Recommendations for Outpatient Follow-up:  -Will be discharged home today. -Advised to follow up with PCP in 2 weeks   Discharge Diagnoses:  Active Problems:   Chest pain Pericardial Rub  Discharge Condition: Stable and improved  Filed Weights   02/17/17 1319 02/17/17 1801  Weight: 78 kg (172 lb) 78.5 kg (173 lb 1.6 oz)    History of present illness:  As per Dr. Ella Jubilee on 9/8: Allen [name] is a 83 y.o. male with medical history significant of coronary artery disease status post coronary artery bypass grafting x5, November 2017. Patient presents with acute episode of left chest pain, ongoing since last night, intermittent and sharp in nature, can get up to a 8 out of 10 in intensity, is not exertional related, not pleuritic in characteristics. Worse with movement of the left upper extremity and leaning forward. No improving factors. No other associated symptoms, no dyspnea, no diaphoresis. Patient was able to sleep despite the intermittent chest pain, this morning the pain was still present, he decided to come to the emergency room for evaluation. Last follow-up with cardiology was in May 2018 no cardiac rub reported, his ejection fraction was 40-45% on the left ventricle, stable, recommendations to follow-up in 6 months.    ED Course: Initial cardiac enzymes and EKG negative for ischemia, noted pericardial rub, referred for admission and further evaluation.  Hospital Course:   Chest Pain -Very atypical. -Has ruled out for ACS with negative troponins and EKG without acute ischemic changes. Further cardiac work up is not anticipated at this time. -Because of a pericardial rub heard on exam, ECHO was performed that does not show a pericardial effusion. -He is not uremic and does not have  typical pericarditis signs on EKG. -Suspect pericardial rub is probably associated to sternotomy and CABG.  Chronic kidney disease stage II -Creatinine has been at baseline of around 1.2.  Rest of chronic conditions have been stable during this short hospitalization.  Procedures:  None   Consultations:  None  Discharge Instructions  Discharge Instructions    Diet - low sodium heart healthy    Complete by:  As directed    Increase activity slowly    Complete by:  As directed      Allergies as of 02/18/2017   No Known Allergies     Medication List    TAKE these medications   amLODipine 10 MG tablet Commonly known as:  NORVASC TAKE 1 TABLET ONE TIME DAILY   aspirin 81 MG EC tablet Take 81 mg by mouth daily.   atorvastatin 80 MG tablet Commonly known as:  LIPITOR Take 1 tablet (80 mg total) by mouth daily at 6 PM.   clopidogrel 75 MG tablet Commonly known as:  PLAVIX TAKE 1 TABLET ONE TIME DAILY WITH BREAKFAST   glimepiride 2 MG tablet Commonly known as:  AMARYL Take 2 mg by mouth 2 (two) times daily.   Insulin Glargine 100 UNIT/ML Solostar Pen Commonly known as:  LANTUS SOLOSTAR Inject 20 Units into the skin at bedtime.   lisinopril 5 MG tablet Commonly known as:  PRINIVIL,ZESTRIL TAKE 1 TABLET (5 MG TOTAL) BY MOUTH DAILY.   pantoprazole 20 MG tablet Commonly known as:  PROTONIX Take 1 tablet (20 mg total) by mouth daily.  Discharge Care Instructions        Start     Ordered   02/18/17 0000  Increase activity slowly     02/18/17 1711   02/18/17 0000  Diet - low sodium heart healthy     02/18/17 1711     No Known Allergies Follow-up Information    Hasanaj, Myra GianottiXaje A, MD. Schedule an appointment as soon as possible for a visit in 2 week(s).   Specialty:  Internal Medicine Contact information: [address] [place] [id number] [phone]            The results of significant diagnostics from this hospitalization  (including imaging, microbiology, ancillary and laboratory) are listed below for reference.    Significant Diagnostic Studies: Dg Chest 2 View  Result Date: 02/17/2017 CLINICAL DATA:  Chest pain EXAM: CHEST  2 VIEW COMPARISON:  06/07/2016 FINDINGS: Postop CABG. Cardiac enlargement without heart failure. Lungs are clear without infiltrate or effusion. IMPRESSION: No active cardiopulmonary disease. Electronically Signed   By: Marlan Palauharles  Clark M.D.   On: 02/17/2017 14:00    Microbiology: No results found for this or any previous visit (from the past 240 hour(s)).   Labs: Basic Metabolic Panel:  Recent Labs Lab 02/17/17 1325 02/17/17 1919 02/18/17 0639  NA 142  --  143  K 4.3  --  4.1  CL 111  --  109  CO2 26  --  27  GLUCOSE 116*  --  101*  BUN 15  --  14  CREATININE 1.22 1.24 1.19  CALCIUM 10.1  --  10.5*   Liver Function Tests:  Recent Labs Lab 02/18/17 0639  AST 33  ALT 20  ALKPHOS 59  BILITOT 0.9  PROT 6.9  ALBUMIN 3.8   No results for input(s): LIPASE, AMYLASE in the last 168 hours. No results for input(s): AMMONIA in the last 168 hours. CBC:  Recent Labs Lab 02/17/17 1325 02/17/17 1919 02/18/17 0639  WBC 5.5 5.7 5.1  HGB 11.7* 11.6* 11.6*  HCT 37.8* 37.8* 36.9*  MCV 85.1 85.7 84.8  PLT 249 243 249   Cardiac Enzymes:  Recent Labs Lab 02/17/17 1325 02/17/17 1919 02/18/17 0036 02/18/17 0639  TROPONINI <0.03 <0.03 <0.03 <0.03   BNP: BNP (last 3 results) No results for input(s): BNP in the last 8760 hours.  ProBNP (last 3 results) No results for input(s): PROBNP in the last 8760 hours.  CBG:  Recent Labs Lab 02/17/17 2047 02/18/17 0758 02/18/17 1139 02/18/17 1651  GLUCAP 124* 82 88 102*       Signed:  HERNANDEZ ACOSTA,ESTELA  [facility] Pager: [phone] 02/18/2017, 5:12 PM

## 2017-02-18 NOTE — Progress Notes (Signed)
Pt discharged in stable condition into the care of his wife via private vehicle.  PIV removed intact w/o S&S of complications.  Discharge instructions reviewed with pt. Pt verbalized understanding. 

## 2017-02-18 NOTE — Progress Notes (Signed)
Pt refusing SCDs stating, "I really don't think I need those leg things. I'm getting up to the bathroom."  RN educated pt on reasoning for SCDs and pt verbalized understanding. RN noticed pt is also on Lovenox for prevention of blood clots as well. SCD machine removed from room. Will continue to monitor pt

## 2017-03-07 DIAGNOSIS — H11823 Conjunctivochalasis, bilateral: Secondary | ICD-10-CM | POA: Diagnosis not present

## 2017-04-10 DIAGNOSIS — I10 Essential (primary) hypertension: Secondary | ICD-10-CM | POA: Diagnosis not present

## 2017-04-10 DIAGNOSIS — K210 Gastro-esophageal reflux disease with esophagitis: Secondary | ICD-10-CM | POA: Diagnosis not present

## 2017-04-10 DIAGNOSIS — E1121 Type 2 diabetes mellitus with diabetic nephropathy: Secondary | ICD-10-CM | POA: Diagnosis not present

## 2017-04-15 ENCOUNTER — Other Ambulatory Visit: Payer: Self-pay | Admitting: Cardiology

## 2017-04-20 DIAGNOSIS — K210 Gastro-esophageal reflux disease with esophagitis: Secondary | ICD-10-CM | POA: Diagnosis not present

## 2017-04-20 DIAGNOSIS — Z6832 Body mass index (BMI) 32.0-32.9, adult: Secondary | ICD-10-CM | POA: Diagnosis not present

## 2017-04-20 DIAGNOSIS — E1121 Type 2 diabetes mellitus with diabetic nephropathy: Secondary | ICD-10-CM | POA: Diagnosis not present

## 2017-04-20 DIAGNOSIS — I2584 Coronary atherosclerosis due to calcified coronary lesion: Secondary | ICD-10-CM | POA: Diagnosis not present

## 2017-04-20 DIAGNOSIS — I10 Essential (primary) hypertension: Secondary | ICD-10-CM | POA: Diagnosis not present

## 2017-04-27 NOTE — Progress Notes (Deleted)
Cardiology Office Note  Date: 04/27/2017   ID: Allen PuttSamuel J [name], DOB 11/14/1933, MRN [id number]  PCP: Toma DeitersHasanaj, Xaje A, MD  Primary Cardiologist: Nona DellSamuel Sharvi Mooneyhan, MD   No chief complaint on file.   History of Present Illness: Allen [name] is an 83 y.o. male last seen in May.  Record review finds hospital admission in September with atypical chest pain.  Cardiac enzymes were negative.  Echocardiogram revealed LVEF 35-40% range, very mild aortic stenosis, mildly dilated aortic root, no pericardial effusion.  Past Medical History:  Diagnosis Date  . CAD (coronary artery disease)    Multivessel obstructive CAD involving the RCA, circumflex, and obtuse marginal system with only mild LAD disease May 2016  . Diabetes mellitus without complication (HCC)   . Essential hypertension   . Hyperlipidemia   . Ischemic cardiomyopathy    LVEF 35% May 2016  . LBBB (left bundle branch block)   . Sinus bradycardia     Past Surgical History:  Procedure Laterality Date  . CORONARY ARTERY BYPASS GRAFTING (CABG) x 5 N/A 05/08/2016   [facility]formed [facility] B[facility]Bartle[facility] Bryan [facility]  . ENDOVEIN HARVEST OF GREATER SAPHENOUS VEIN  05/08/2016   Performed by [facility]leen BorneBartle, Bryan K[facility]  . Left Heart Cath and Coronary Angiography N/A 05/03/2016   Performed by [place], Peter M, [facility]  . Left Heart Cath and Coronary Angiography N/A 11/02/2014   Performed by Lyn RecordsSmith, Henry W, [facility]  . TRANSESOPHAGEAL ECHOCARDIOGRAM (TEE) N/A 05/08/2016   Performed by Alleen BorneBartle, Bryan K, [facility]    Current Outpatient Medications  Medication Sig Dispense Refill  . amLODipine (NORVASC) 10 MG tablet TAKE 1 TABLET EVERY DAY 90 tablet 3  . aspirin 81 MG EC tablet Take 81 mg by mouth daily.    Marland Kitchen. atorvastatin (LIPITOR) 80 MG tablet Take 1 tablet (80 mg total) by mouth daily at 6 PM. 90 tablet 3  . clopidogrel (PLAVIX) 75 MG tablet TAKE 1 TABLET ONE TIME DAILY WITH BREAKFAST 90 tablet 3  .  glimepiride (AMARYL) 2 MG tablet Take 2 mg by mouth 2 (two) times daily.    . Insulin Glargine (LANTUS SOLOSTAR) 100 UNIT/ML Solostar Pen Inject 20 Units into the skin at bedtime. 15 mL 11  . lisinopril (PRINIVIL,ZESTRIL) 5 MG tablet TAKE 1 TABLET EVERY DAY 90 tablet 3  . pantoprazole (PROTONIX) 20 MG tablet Take 1 tablet (20 mg total) by mouth daily. 90 tablet 1   No current facility-administered medications for this visit.    Allergies:  Patient has no known allergies.   Social History: The patient  reports that he quit smoking about 5 years ago. His smoking use included cigarettes. He started smoking about 61 years ago. He has a 28.00 pack-year smoking history. he has never used smokeless tobacco. He reports that he does not drink alcohol or use drugs.   Family History: The patient's family history includes Coronary artery disease in his brother.   ROS:  Please see the history of present illness. Otherwise, complete review of systems is positive for {NONE DEFAULTED:18576::"none"}.  All other systems are reviewed and negative.   Physical Exam: VS:  There were no vitals taken for this visit., BMI There is no height or weight on file to calculate BMI.  Wt Readings from Last 3 Encounters:  02/17/17 173 lb 1.6 oz (78.5 kg)  10/30/16 173 lb 12.8 oz (78.8 kg)  06/30/16 170 lb 6.4 oz (77.3 kg)    General: Patient appears comfortable at rest. HEENT: Conjunctiva and lids normal, oropharynx clear with moist mucosa. Neck: Supple, no elevated JVP or carotid bruits, no thyromegaly. Lungs: Clear to auscultation, nonlabored breathing at rest. Cardiac: Regular rate and rhythm, no S3 or significant systolic murmur, no pericardial rub. Abdomen: Soft, nontender, no hepatomegaly, bowel sounds present, no guarding or rebound. Extremities: No pitting edema, distal pulses 2+. Skin: Warm and dry. Musculoskeletal: No kyphosis. Neuropsychiatric: Alert and oriented x3, affect grossly appropriate.  ECG: I  personally reviewed the tracing from 02/17/2017 which showed sinus rhythm with left bundle branch block.  Recent Labwork: 05/12/2016: Magnesium 2.6 02/18/2017: ALT 20; AST 33; BUN 14; Creatinine, Ser 1.19; Hemoglobin 11.6; Platelets 249; Potassium 4.1; Sodium 143     Component Value Date/Time   CHOL 170 11/02/2014 0213   TRIG 121 11/02/2014 0213   HDL 45 11/02/2014 0213   CHOLHDL 3.8 11/02/2014 0213   VLDL 24 11/02/2014 0213   LDLCALC 101 (H) 11/02/2014 0213    Other Studies Reviewed Today:  Echocardiogram 02/18/2017: Study Conclusions  - Left ventricle: The cavity size was normal. There was moderate   concentric hypertrophy. Systolic function was moderately reduced.   The estimated ejection fraction was in the range of 35% to 40%.   Hypokinesis of the anteroseptal, anterior, and apical myocardium. - Ventricular septum: Septal motion showed abnormal function and   dyssynergy. - Aortic valve: Trileaflet; normal thickness, moderately calcified   leaflets. Valve mobility was trivially restricted. Transvalvular   velocity was minimally increased. There was very mild stenosis.   There was mild regurgitation. Valve area (VTI): 1.88 cm^2. Valve   area (Vmax): 1.65 cm^2. Valve area (Vmean): 1.78 cm^2. - Aortic root: The aortic root was mildly dilated. - Mitral valve: There was mild regurgitation. - Left atrium: The atrium was moderately dilated.  Assessment and Plan:    Current medicines were reviewed with the patient today.  No orders of the defined types were placed in this encounter.   Disposition:  Signed, Jonelle SidleSamuel G. Ida Milbrath, MD, [facility] 04/27/2017 10:21 AM    [facility] [address], [place], [place] [address] Phone: [phone]; Fax: [phone]

## 2017-04-30 ENCOUNTER — Ambulatory Visit: Payer: Medicare PPO | Admitting: Cardiology

## 2017-05-10 DIAGNOSIS — R3915 Urgency of urination: Secondary | ICD-10-CM | POA: Diagnosis not present

## 2017-05-10 DIAGNOSIS — R351 Nocturia: Secondary | ICD-10-CM | POA: Diagnosis not present

## 2017-05-10 DIAGNOSIS — N401 Enlarged prostate with lower urinary tract symptoms: Secondary | ICD-10-CM | POA: Diagnosis not present

## 2017-05-10 DIAGNOSIS — R972 Elevated prostate specific antigen [PSA]: Secondary | ICD-10-CM | POA: Diagnosis not present

## 2017-05-29 DIAGNOSIS — K210 Gastro-esophageal reflux disease with esophagitis: Secondary | ICD-10-CM | POA: Diagnosis not present

## 2017-05-29 DIAGNOSIS — E1121 Type 2 diabetes mellitus with diabetic nephropathy: Secondary | ICD-10-CM | POA: Diagnosis not present

## 2017-05-29 DIAGNOSIS — I10 Essential (primary) hypertension: Secondary | ICD-10-CM | POA: Diagnosis not present

## 2017-06-20 DIAGNOSIS — H11153 Pinguecula, bilateral: Secondary | ICD-10-CM | POA: Diagnosis not present

## 2017-06-20 DIAGNOSIS — H5203 Hypermetropia, bilateral: Secondary | ICD-10-CM | POA: Diagnosis not present

## 2017-06-20 DIAGNOSIS — H524 Presbyopia: Secondary | ICD-10-CM | POA: Diagnosis not present

## 2017-06-20 DIAGNOSIS — E119 Type 2 diabetes mellitus without complications: Secondary | ICD-10-CM | POA: Diagnosis not present

## 2017-06-20 DIAGNOSIS — H52223 Regular astigmatism, bilateral: Secondary | ICD-10-CM | POA: Diagnosis not present

## 2017-06-20 DIAGNOSIS — H43813 Vitreous degeneration, bilateral: Secondary | ICD-10-CM | POA: Diagnosis not present

## 2017-06-20 DIAGNOSIS — H18413 Arcus senilis, bilateral: Secondary | ICD-10-CM | POA: Diagnosis not present

## 2017-06-20 DIAGNOSIS — H2513 Age-related nuclear cataract, bilateral: Secondary | ICD-10-CM | POA: Diagnosis not present

## 2017-06-21 DIAGNOSIS — R972 Elevated prostate specific antigen [PSA]: Secondary | ICD-10-CM | POA: Diagnosis not present

## 2017-06-21 DIAGNOSIS — R3913 Splitting of urinary stream: Secondary | ICD-10-CM | POA: Diagnosis not present

## 2017-06-21 DIAGNOSIS — N401 Enlarged prostate with lower urinary tract symptoms: Secondary | ICD-10-CM | POA: Diagnosis not present

## 2017-06-21 DIAGNOSIS — R3915 Urgency of urination: Secondary | ICD-10-CM | POA: Diagnosis not present

## 2017-06-21 DIAGNOSIS — R351 Nocturia: Secondary | ICD-10-CM | POA: Diagnosis not present

## 2017-07-09 DIAGNOSIS — Z01810 Encounter for preprocedural cardiovascular examination: Secondary | ICD-10-CM | POA: Diagnosis not present

## 2017-07-09 DIAGNOSIS — R3915 Urgency of urination: Secondary | ICD-10-CM | POA: Diagnosis not present

## 2017-07-09 DIAGNOSIS — R3912 Poor urinary stream: Secondary | ICD-10-CM | POA: Diagnosis not present

## 2017-07-09 DIAGNOSIS — R351 Nocturia: Secondary | ICD-10-CM | POA: Diagnosis not present

## 2017-07-09 DIAGNOSIS — R3913 Splitting of urinary stream: Secondary | ICD-10-CM | POA: Diagnosis not present

## 2017-07-09 DIAGNOSIS — R3914 Feeling of incomplete bladder emptying: Secondary | ICD-10-CM | POA: Diagnosis not present

## 2017-07-09 DIAGNOSIS — I10 Essential (primary) hypertension: Secondary | ICD-10-CM | POA: Diagnosis not present

## 2017-07-09 DIAGNOSIS — R972 Elevated prostate specific antigen [PSA]: Secondary | ICD-10-CM | POA: Diagnosis not present

## 2017-07-09 DIAGNOSIS — Z87438 Personal history of other diseases of male genital organs: Secondary | ICD-10-CM | POA: Diagnosis not present

## 2017-07-09 DIAGNOSIS — N401 Enlarged prostate with lower urinary tract symptoms: Secondary | ICD-10-CM | POA: Diagnosis not present

## 2017-07-11 DIAGNOSIS — N401 Enlarged prostate with lower urinary tract symptoms: Secondary | ICD-10-CM | POA: Diagnosis not present

## 2017-07-11 DIAGNOSIS — Z87438 Personal history of other diseases of male genital organs: Secondary | ICD-10-CM | POA: Diagnosis not present

## 2017-07-11 DIAGNOSIS — R3915 Urgency of urination: Secondary | ICD-10-CM | POA: Diagnosis not present

## 2017-07-11 DIAGNOSIS — R3913 Splitting of urinary stream: Secondary | ICD-10-CM | POA: Diagnosis not present

## 2017-07-11 DIAGNOSIS — I2510 Atherosclerotic heart disease of native coronary artery without angina pectoris: Secondary | ICD-10-CM | POA: Diagnosis not present

## 2017-07-11 DIAGNOSIS — N138 Other obstructive and reflux uropathy: Secondary | ICD-10-CM | POA: Diagnosis not present

## 2017-07-11 DIAGNOSIS — R3914 Feeling of incomplete bladder emptying: Secondary | ICD-10-CM | POA: Diagnosis not present

## 2017-07-11 DIAGNOSIS — I10 Essential (primary) hypertension: Secondary | ICD-10-CM | POA: Diagnosis not present

## 2017-07-11 DIAGNOSIS — R351 Nocturia: Secondary | ICD-10-CM | POA: Diagnosis not present

## 2017-07-11 DIAGNOSIS — R972 Elevated prostate specific antigen [PSA]: Secondary | ICD-10-CM | POA: Diagnosis not present

## 2017-07-11 DIAGNOSIS — E119 Type 2 diabetes mellitus without complications: Secondary | ICD-10-CM | POA: Diagnosis not present

## 2017-07-11 DIAGNOSIS — R3912 Poor urinary stream: Secondary | ICD-10-CM | POA: Diagnosis not present

## 2017-07-12 DIAGNOSIS — R3914 Feeling of incomplete bladder emptying: Secondary | ICD-10-CM | POA: Diagnosis not present

## 2017-07-19 DIAGNOSIS — I10 Essential (primary) hypertension: Secondary | ICD-10-CM | POA: Diagnosis not present

## 2017-07-19 DIAGNOSIS — K210 Gastro-esophageal reflux disease with esophagitis: Secondary | ICD-10-CM | POA: Diagnosis not present

## 2017-07-19 DIAGNOSIS — E1121 Type 2 diabetes mellitus with diabetic nephropathy: Secondary | ICD-10-CM | POA: Diagnosis not present

## 2017-07-27 DIAGNOSIS — R350 Frequency of micturition: Secondary | ICD-10-CM | POA: Diagnosis not present

## 2017-07-27 DIAGNOSIS — R351 Nocturia: Secondary | ICD-10-CM | POA: Diagnosis not present

## 2017-07-27 DIAGNOSIS — R972 Elevated prostate specific antigen [PSA]: Secondary | ICD-10-CM | POA: Diagnosis not present

## 2017-07-27 DIAGNOSIS — R3914 Feeling of incomplete bladder emptying: Secondary | ICD-10-CM | POA: Diagnosis not present

## 2017-07-27 DIAGNOSIS — N401 Enlarged prostate with lower urinary tract symptoms: Secondary | ICD-10-CM | POA: Diagnosis not present

## 2017-08-14 DIAGNOSIS — E1121 Type 2 diabetes mellitus with diabetic nephropathy: Secondary | ICD-10-CM | POA: Diagnosis not present

## 2017-08-14 DIAGNOSIS — I10 Essential (primary) hypertension: Secondary | ICD-10-CM | POA: Diagnosis not present

## 2017-08-14 DIAGNOSIS — K210 Gastro-esophageal reflux disease with esophagitis: Secondary | ICD-10-CM | POA: Diagnosis not present

## 2017-08-24 DIAGNOSIS — R972 Elevated prostate specific antigen [PSA]: Secondary | ICD-10-CM | POA: Diagnosis not present

## 2017-08-24 DIAGNOSIS — R3915 Urgency of urination: Secondary | ICD-10-CM | POA: Diagnosis not present

## 2017-08-24 DIAGNOSIS — R351 Nocturia: Secondary | ICD-10-CM | POA: Diagnosis not present

## 2017-08-24 DIAGNOSIS — R3914 Feeling of incomplete bladder emptying: Secondary | ICD-10-CM | POA: Diagnosis not present

## 2017-08-24 DIAGNOSIS — N401 Enlarged prostate with lower urinary tract symptoms: Secondary | ICD-10-CM | POA: Diagnosis not present

## 2017-08-24 DIAGNOSIS — R350 Frequency of micturition: Secondary | ICD-10-CM | POA: Diagnosis not present

## 2017-09-11 DIAGNOSIS — I2584 Coronary atherosclerosis due to calcified coronary lesion: Secondary | ICD-10-CM | POA: Diagnosis not present

## 2017-09-11 DIAGNOSIS — K210 Gastro-esophageal reflux disease with esophagitis: Secondary | ICD-10-CM | POA: Diagnosis not present

## 2017-09-11 DIAGNOSIS — E1121 Type 2 diabetes mellitus with diabetic nephropathy: Secondary | ICD-10-CM | POA: Diagnosis not present

## 2017-09-11 DIAGNOSIS — Z6832 Body mass index (BMI) 32.0-32.9, adult: Secondary | ICD-10-CM | POA: Diagnosis not present

## 2017-09-11 DIAGNOSIS — I10 Essential (primary) hypertension: Secondary | ICD-10-CM | POA: Diagnosis not present

## 2017-09-13 DIAGNOSIS — N401 Enlarged prostate with lower urinary tract symptoms: Secondary | ICD-10-CM | POA: Diagnosis not present

## 2017-09-13 DIAGNOSIS — R350 Frequency of micturition: Secondary | ICD-10-CM | POA: Diagnosis not present

## 2017-09-13 DIAGNOSIS — R3914 Feeling of incomplete bladder emptying: Secondary | ICD-10-CM | POA: Diagnosis not present

## 2017-09-13 DIAGNOSIS — R972 Elevated prostate specific antigen [PSA]: Secondary | ICD-10-CM | POA: Diagnosis not present

## 2017-09-13 DIAGNOSIS — R351 Nocturia: Secondary | ICD-10-CM | POA: Diagnosis not present

## 2017-09-13 DIAGNOSIS — R3915 Urgency of urination: Secondary | ICD-10-CM | POA: Diagnosis not present

## 2017-10-31 DIAGNOSIS — R972 Elevated prostate specific antigen [PSA]: Secondary | ICD-10-CM | POA: Diagnosis not present

## 2017-10-31 DIAGNOSIS — R350 Frequency of micturition: Secondary | ICD-10-CM | POA: Diagnosis not present

## 2017-10-31 DIAGNOSIS — N401 Enlarged prostate with lower urinary tract symptoms: Secondary | ICD-10-CM | POA: Diagnosis not present

## 2017-10-31 DIAGNOSIS — R3914 Feeling of incomplete bladder emptying: Secondary | ICD-10-CM | POA: Diagnosis not present

## 2017-10-31 DIAGNOSIS — R3912 Poor urinary stream: Secondary | ICD-10-CM | POA: Diagnosis not present

## 2017-10-31 DIAGNOSIS — R3129 Other microscopic hematuria: Secondary | ICD-10-CM | POA: Diagnosis not present

## 2017-11-23 DIAGNOSIS — Z7902 Long term (current) use of antithrombotics/antiplatelets: Secondary | ICD-10-CM | POA: Diagnosis not present

## 2017-11-23 DIAGNOSIS — Z794 Long term (current) use of insulin: Secondary | ICD-10-CM | POA: Diagnosis not present

## 2017-11-23 DIAGNOSIS — R0602 Shortness of breath: Secondary | ICD-10-CM | POA: Diagnosis not present

## 2017-11-23 DIAGNOSIS — R0600 Dyspnea, unspecified: Secondary | ICD-10-CM | POA: Diagnosis not present

## 2017-11-23 DIAGNOSIS — E119 Type 2 diabetes mellitus without complications: Secondary | ICD-10-CM | POA: Diagnosis not present

## 2017-11-23 DIAGNOSIS — I10 Essential (primary) hypertension: Secondary | ICD-10-CM | POA: Diagnosis not present

## 2017-11-23 DIAGNOSIS — Z7982 Long term (current) use of aspirin: Secondary | ICD-10-CM | POA: Diagnosis not present

## 2017-11-23 DIAGNOSIS — R0609 Other forms of dyspnea: Secondary | ICD-10-CM | POA: Diagnosis not present

## 2017-11-23 DIAGNOSIS — E7800 Pure hypercholesterolemia, unspecified: Secondary | ICD-10-CM | POA: Diagnosis not present

## 2017-11-23 DIAGNOSIS — I252 Old myocardial infarction: Secondary | ICD-10-CM | POA: Diagnosis not present

## 2017-11-23 DIAGNOSIS — Z79899 Other long term (current) drug therapy: Secondary | ICD-10-CM | POA: Diagnosis not present

## 2017-12-20 DIAGNOSIS — I10 Essential (primary) hypertension: Secondary | ICD-10-CM | POA: Diagnosis not present

## 2017-12-20 DIAGNOSIS — E1121 Type 2 diabetes mellitus with diabetic nephropathy: Secondary | ICD-10-CM | POA: Diagnosis not present

## 2017-12-20 DIAGNOSIS — Z6828 Body mass index (BMI) 28.0-28.9, adult: Secondary | ICD-10-CM | POA: Diagnosis not present

## 2017-12-20 DIAGNOSIS — Z0000 Encounter for general adult medical examination without abnormal findings: Secondary | ICD-10-CM | POA: Diagnosis not present

## 2017-12-20 DIAGNOSIS — I2584 Coronary atherosclerosis due to calcified coronary lesion: Secondary | ICD-10-CM | POA: Diagnosis not present

## 2017-12-20 DIAGNOSIS — Z125 Encounter for screening for malignant neoplasm of prostate: Secondary | ICD-10-CM | POA: Diagnosis not present

## 2017-12-20 DIAGNOSIS — K210 Gastro-esophageal reflux disease with esophagitis: Secondary | ICD-10-CM | POA: Diagnosis not present

## 2017-12-20 DIAGNOSIS — Z1389 Encounter for screening for other disorder: Secondary | ICD-10-CM | POA: Diagnosis not present

## 2018-01-17 DIAGNOSIS — I10 Essential (primary) hypertension: Secondary | ICD-10-CM | POA: Diagnosis not present

## 2018-01-17 DIAGNOSIS — K210 Gastro-esophageal reflux disease with esophagitis: Secondary | ICD-10-CM | POA: Diagnosis not present

## 2018-01-17 DIAGNOSIS — Z7984 Long term (current) use of oral hypoglycemic drugs: Secondary | ICD-10-CM | POA: Diagnosis not present

## 2018-01-17 DIAGNOSIS — E119 Type 2 diabetes mellitus without complications: Secondary | ICD-10-CM | POA: Diagnosis not present

## 2018-01-17 DIAGNOSIS — Z6831 Body mass index (BMI) 31.0-31.9, adult: Secondary | ICD-10-CM | POA: Diagnosis not present

## 2018-01-17 DIAGNOSIS — I252 Old myocardial infarction: Secondary | ICD-10-CM | POA: Diagnosis not present

## 2018-01-17 DIAGNOSIS — E785 Hyperlipidemia, unspecified: Secondary | ICD-10-CM | POA: Diagnosis not present

## 2018-01-17 DIAGNOSIS — Z87891 Personal history of nicotine dependence: Secondary | ICD-10-CM | POA: Diagnosis not present

## 2018-01-17 DIAGNOSIS — E1121 Type 2 diabetes mellitus with diabetic nephropathy: Secondary | ICD-10-CM | POA: Diagnosis not present

## 2018-01-17 DIAGNOSIS — K219 Gastro-esophageal reflux disease without esophagitis: Secondary | ICD-10-CM | POA: Diagnosis not present

## 2018-01-22 DIAGNOSIS — R972 Elevated prostate specific antigen [PSA]: Secondary | ICD-10-CM | POA: Diagnosis not present

## 2018-01-22 DIAGNOSIS — R3914 Feeling of incomplete bladder emptying: Secondary | ICD-10-CM | POA: Diagnosis not present

## 2018-01-22 DIAGNOSIS — R351 Nocturia: Secondary | ICD-10-CM | POA: Diagnosis not present

## 2018-01-22 DIAGNOSIS — R350 Frequency of micturition: Secondary | ICD-10-CM | POA: Diagnosis not present

## 2018-01-22 DIAGNOSIS — N401 Enlarged prostate with lower urinary tract symptoms: Secondary | ICD-10-CM | POA: Diagnosis not present

## 2018-02-15 DIAGNOSIS — Z0000 Encounter for general adult medical examination without abnormal findings: Secondary | ICD-10-CM | POA: Diagnosis not present

## 2018-02-15 DIAGNOSIS — Z6828 Body mass index (BMI) 28.0-28.9, adult: Secondary | ICD-10-CM | POA: Diagnosis not present

## 2018-02-15 DIAGNOSIS — I10 Essential (primary) hypertension: Secondary | ICD-10-CM | POA: Diagnosis not present

## 2018-02-15 DIAGNOSIS — I2584 Coronary atherosclerosis due to calcified coronary lesion: Secondary | ICD-10-CM | POA: Diagnosis not present

## 2018-02-15 DIAGNOSIS — E1121 Type 2 diabetes mellitus with diabetic nephropathy: Secondary | ICD-10-CM | POA: Diagnosis not present

## 2018-02-15 DIAGNOSIS — Z6829 Body mass index (BMI) 29.0-29.9, adult: Secondary | ICD-10-CM | POA: Diagnosis not present

## 2018-02-15 DIAGNOSIS — K210 Gastro-esophageal reflux disease with esophagitis: Secondary | ICD-10-CM | POA: Diagnosis not present

## 2018-02-15 DIAGNOSIS — Z1389 Encounter for screening for other disorder: Secondary | ICD-10-CM | POA: Diagnosis not present

## 2018-03-06 DIAGNOSIS — Z7902 Long term (current) use of antithrombotics/antiplatelets: Secondary | ICD-10-CM | POA: Diagnosis not present

## 2018-03-06 DIAGNOSIS — Z794 Long term (current) use of insulin: Secondary | ICD-10-CM | POA: Diagnosis not present

## 2018-03-06 DIAGNOSIS — I110 Hypertensive heart disease with heart failure: Secondary | ICD-10-CM | POA: Diagnosis not present

## 2018-03-06 DIAGNOSIS — J449 Chronic obstructive pulmonary disease, unspecified: Secondary | ICD-10-CM | POA: Diagnosis not present

## 2018-03-06 DIAGNOSIS — Z23 Encounter for immunization: Secondary | ICD-10-CM | POA: Diagnosis not present

## 2018-03-06 DIAGNOSIS — E119 Type 2 diabetes mellitus without complications: Secondary | ICD-10-CM | POA: Diagnosis not present

## 2018-03-06 DIAGNOSIS — J441 Chronic obstructive pulmonary disease with (acute) exacerbation: Secondary | ICD-10-CM | POA: Diagnosis not present

## 2018-03-06 DIAGNOSIS — I5031 Acute diastolic (congestive) heart failure: Secondary | ICD-10-CM | POA: Diagnosis not present

## 2018-03-06 DIAGNOSIS — Z7982 Long term (current) use of aspirin: Secondary | ICD-10-CM | POA: Diagnosis not present

## 2018-03-06 DIAGNOSIS — E785 Hyperlipidemia, unspecified: Secondary | ICD-10-CM | POA: Diagnosis not present

## 2018-03-06 DIAGNOSIS — I340 Nonrheumatic mitral (valve) insufficiency: Secondary | ICD-10-CM | POA: Diagnosis not present

## 2018-03-19 ENCOUNTER — Encounter: Payer: Self-pay | Admitting: *Deleted

## 2018-03-19 ENCOUNTER — Telehealth: Payer: Self-pay | Admitting: Cardiology

## 2018-03-19 DIAGNOSIS — Z6828 Body mass index (BMI) 28.0-28.9, adult: Secondary | ICD-10-CM | POA: Diagnosis not present

## 2018-03-19 DIAGNOSIS — J302 Other seasonal allergic rhinitis: Secondary | ICD-10-CM | POA: Diagnosis not present

## 2018-03-19 DIAGNOSIS — J449 Chronic obstructive pulmonary disease, unspecified: Secondary | ICD-10-CM | POA: Diagnosis not present

## 2018-03-19 NOTE — Telephone Encounter (Signed)
Records requested from [facility].

## 2018-03-19 NOTE — Telephone Encounter (Signed)
I received phone call from Dr. Olena Leatherwood in [place] regarding Allen [name].  He tells me that the patient was recently admitted to [facility] with CHF and COPD exacerbation.  During hospital work-up and management he underwent an echocardiogram which reported moderate LVH with LVEF less than 20%, normal right ventricular contraction, mild to moderate left atrial enlargement, and mild mitral regurgitation.  He presented to see Dr. Olena Leatherwood for hospital follow-up and reportedly has a visit to see [place] back in the office.  He is overdue, I have not seen him since May of last year.  Based on prior echocardiograms his LVEF has declined.  We will go over his medications and proceed from there in terms of further discussion.

## 2018-04-01 ENCOUNTER — Encounter: Payer: Self-pay | Admitting: Cardiology

## 2018-04-01 NOTE — Progress Notes (Signed)
Cardiology Office Note  Date: 04/02/2018   ID: Allen [name], DOB [birth year], MRN [id number]  PCP: [name], MD  Primary Cardiologist: [name], MD   Chief Complaint  Patient presents with  . Cardiomyopathy    History of Present Illness: Allen [name] is an 84 y.o. male last seen in May 2018.  He presents today overdue for follow-up.  Phone call from Dr. Olena Leatherwood received prior to our visit today reporting hospitalization at [facility] with congestive heart failure and COPD exacerbation. Echocardiogram reported moderate LVH with LVEF less than 20%, normal right ventricular contraction, mild to moderate left atrial enlargement, and mild mitral regurgitation.  ECG showed sinus rhythm with prolonged PR interval and old left bundle branch block.  He is here today with his daughter for a follow-up visit.  Reports intermittent shortness of breath, no obvious chest pain or palpitations, no leg swelling.  He is using nebulizer treatments and reports benefit.  He states that he has been compliant with medications.  Of note, he is now on Lasix and had lisinopril dose increased to 20 mg daily.  He has not been on beta-blocker previously with bradycardia and left bundle branch block.  His daughter asked whether he needed a "pacemaker" indicating that this has been discussed with them by Dr. Olena Leatherwood.  Today we talked about the decline in LVEF by recent echocardiogram in comparison with previous assessment.  Troponin T level during hospital stay was negative arguing against ACS. We also discussed his medications and potential adjustments prior to considering device therapies.  Past Medical History:  Diagnosis Date  . CAD (coronary artery disease)    Multivessel obstructive CAD involving the RCA, circumflex, and obtuse marginal system with only mild LAD disease May 2016  . Essential hypertension   . Hyperlipidemia   . Ischemic cardiomyopathy    LVEF 35% May 2016  .  LBBB (left bundle branch block)   . Sinus bradycardia   . Type 2 diabetes mellitus (HCC)     Past Surgical History:  Procedure Laterality Date  . CARDIAC CATHETERIZATION N/A 11/02/2014   Procedure: Left Heart Cath and Coronary Angiography;  Surgeon: Lyn Records, MD;  Location: [facility] INVASIVE CV LAB;  Service: Cardiovascular;  Laterality: N/A;  . CARDIAC CATHETERIZATION N/A 05/03/2016   Procedure: Left Heart Cath and Coronary Angiography;  Surgeon: Peter M Swaziland, MD;  Location: [facility] INVASIVE CV LAB;  Service: Cardiovascular;  Laterality: N/A;  . CORONARY ARTERY BYPASS GRAFT N/A 05/08/2016   Procedure: CORONARY ARTERY BYPASS GRAFTING (CABG) x 5;  Surgeon: Alleen Borne, MD;  Location: [facility] OR;  Service: Open Heart Surgery;  Laterality: N/A;  . ENDOVEIN HARVEST OF GREATER SAPHENOUS VEIN  05/08/2016   Procedure: ENDOVEIN HARVEST OF GREATER SAPHENOUS VEIN;  Surgeon: Alleen Borne, MD;  Location: [facility] OR;  Service: Open Heart Surgery;;  . TEE WITHOUT CARDIOVERSION N/A 05/08/2016   Procedure: TRANSESOPHAGEAL ECHOCARDIOGRAM (TEE);  Surgeon: Alleen Borne, MD;  Location: [facility] OR;  Service: Open Heart Surgery;  Laterality: N/A;    Current Outpatient Medications  Medication Sig Dispense Refill  . amLODipine (NORVASC) 10 MG tablet TAKE 1 TABLET EVERY DAY 90 tablet 3  . aspirin 81 MG EC tablet Take 81 mg by mouth daily.    Marland Kitchen atorvastatin (LIPITOR) 80 MG tablet Take 1 tablet (80 mg total) by mouth daily at 6 PM. 90 tablet 3  . clopidogrel (PLAVIX) 75 MG tablet TAKE 1 TABLET ONE TIME  DAILY WITH BREAKFAST 90 tablet 3  . furosemide (LASIX) 20 MG tablet Take 20 mg by mouth 2 (two) times daily.    Marland Kitchen glimepiride (AMARYL) 2 MG tablet Take 2 mg by mouth 2 (two) times daily.    . Insulin Glargine (LANTUS SOLOSTAR) 100 UNIT/ML Solostar Pen Inject 20 Units into the skin at bedtime. 15 mL 11  . pantoprazole (PROTONIX) 20 MG tablet Take 1 tablet (20 mg total) by mouth daily. 90 tablet 1  . [START ON 04/05/2018]  sacubitril-valsartan (ENTRESTO) 24-26 MG Take 1 tablet by mouth 2 (two) times daily. Start this 48 hours after stopping lisinopril-start taking this on Friday 04/05/18 60 tablet 6   No current facility-administered medications for this visit.    Allergies:  Patient has no known allergies.   Social History: The patient  reports that he quit smoking about 6 years ago. His smoking use included cigarettes. He started smoking about 62 years ago. He has a 28.00 pack-year smoking history. He has never used smokeless tobacco. He reports that he does not drink alcohol or use drugs.   ROS:  Please see the history of present illness. Otherwise, complete review of systems is positive for hearing loss.  All other systems are reviewed and negative.   Physical Exam: VS:  BP 110/62   Pulse 72   Ht 5\' 2"  (1.575 m)   Wt 178 lb (80.7 kg)   SpO2 91%   BMI 32.56 kg/m , BMI Body mass index is 32.56 kg/m.  Wt Readings from Last 3 Encounters:  04/02/18 178 lb (80.7 kg)  02/17/17 173 lb 1.6 oz (78.5 kg)  10/30/16 173 lb 12.8 oz (78.8 kg)    General: Elderly male, no distress.  HEENT: Conjunctiva and lids normal, oropharynx clear. Neck: Supple, no elevated JVP or carotid bruits, no thyromegaly. Lungs: Diminished breath sounds without wheezing, nonlabored breathing at rest. Cardiac: Regular rate and rhythm, no S3 or significant systolic murmur, no pericardial rub. Thorax: Well-healed sternal incision. Abdomen: Soft, nontender, bowel sounds present. Extremities: No pitting edema, distal pulses 2+. Skin: Warm and dry. Musculoskeletal: No kyphosis. Neuropsychiatric: Alert and oriented x3, affect grossly appropriate.  ECG: I personally reviewed the tracing from 02/17/2017 which showed sinus rhythm with left bundle branch block.  Recent Labwork:  September 2018: Hemoglobin 11.6, platelets 249, potassium 4.1, BUN 14, creatinine 1.19, AST 33, ALT 01 March 2018: BUN 13, creatinine 1.23, potassium 4.9, AST  29, ALT 13, hemoglobin 13.7, platelets 281  Other Studies Reviewed Today:  Echocardiogram 02/18/2017: Study Conclusions  - Left ventricle: The cavity size was normal. There was moderate   concentric hypertrophy. Systolic function was moderately reduced.   The estimated ejection fraction was in the range of 35% to 40%.   Hypokinesis of the anteroseptal, anterior, and apical myocardium. - Ventricular septum: Septal motion showed abnormal function and   dyssynergy. - Aortic valve: Trileaflet; normal thickness, moderately calcified   leaflets. Valve mobility was trivially restricted. Transvalvular   velocity was minimally increased. There was very mild stenosis.   There was mild regurgitation. Valve area (VTI): 1.88 cm^2. Valve   area (Vmax): 1.65 cm^2. Valve area (Vmean): 1.78 cm^2. - Aortic root: The aortic root was mildly dilated. - Mitral valve: There was mild regurgitation. - Left atrium: The atrium was moderately dilated.  Echocardiogram 03/07/2018 [facility]): Mild to moderate LVH with LVEF less than 20%, grade 1 diastolic dysfunction, mild to moderate left atrial enlargement, normal right ventricular contraction, mild aortic stenosis,  mild mitral regurgitation.  Assessment and Plan:  1.  Cardiomyopathy in the setting of known ischemic heart disease, LVEF reported as less than 20% by recent echocardiogram done at [facility].  This is reduced in comparison to prior assessment.  Today we talked about medication adjustments and will plan to stop lisinopril in favor of starting Entresto.  He will need to follow-up BMET in 2 weeks.  Otherwise continue present medications including aspirin, Plavix, Lipitor, and Norvasc.  He will stay on Lasix for now.  Hold off beta-blocker in light of prior documented bradycardia as well as chronic left bundle branch block.  Discussed obtaining daily weights and recording these for review, also appropriate fluid and salt  restriction guidelines.  Medical follow-up and ultimately reassessment of LVEF after medication adjustments.  We can discuss further whether he would be considered for EP referral to discuss resynchronization therapy.  2.  CAD status post CABG in November 2017.  Continue aspirin and statin.  Recent troponin T level negative during hospitalization with heart failure arguing against ACS.  3.  Chronic left bundle branch block.  4.  Essential hypertension, blood pressure is well controlled today.  5.  Mixed hyperlipidemia, on Lipitor.  Current medicines were reviewed with the patient today.   Orders Placed This Encounter  Procedures  . Basic metabolic panel    Disposition: Follow-up in 6 weeks.  Signed, Jonelle Sidle, MD, [facility] 04/02/2018 10:18 AM    [facility] at [facility] [address] [place], [place], [place] [address] Phone: [phone]; Fax: [phone]

## 2018-04-02 ENCOUNTER — Other Ambulatory Visit: Payer: Self-pay | Admitting: *Deleted

## 2018-04-02 ENCOUNTER — Ambulatory Visit: Payer: Medicare PPO | Admitting: Cardiology

## 2018-04-02 ENCOUNTER — Encounter: Payer: Self-pay | Admitting: Cardiology

## 2018-04-02 VITALS — BP 110/62 | HR 72 | Ht 62.0 in | Wt 178.0 lb

## 2018-04-02 DIAGNOSIS — I255 Ischemic cardiomyopathy: Secondary | ICD-10-CM | POA: Diagnosis not present

## 2018-04-02 DIAGNOSIS — E782 Mixed hyperlipidemia: Secondary | ICD-10-CM

## 2018-04-02 DIAGNOSIS — I10 Essential (primary) hypertension: Secondary | ICD-10-CM

## 2018-04-02 DIAGNOSIS — I447 Left bundle-branch block, unspecified: Secondary | ICD-10-CM | POA: Diagnosis not present

## 2018-04-02 DIAGNOSIS — I25709 Atherosclerosis of coronary artery bypass graft(s), unspecified, with unspecified angina pectoris: Secondary | ICD-10-CM | POA: Diagnosis not present

## 2018-04-02 MED ORDER — SACUBITRIL-VALSARTAN 24-26 MG PO TABS: 1.0000 | ORAL_TABLET | Freq: Two times a day (BID) | ORAL | 6 refills | Status: DC

## 2018-04-02 NOTE — Patient Instructions (Addendum)
Medication Instructions:   Your physician has recommended you make the following change in your medication:   Stop lisinopril.  Start entresto 24/26 mg 48 hours after stopping lisinopril taking one tablet by mouth twice daily. Use the voucher given to you today to get your first month free.  Continue all other medications the same.  Labwork:  Your physician recommends that you return for lab work in: 2 weeks to check your BMET-lab order given during visit.  Testing/Procedures:  NONE  Follow-Up:  Your physician recommends that you schedule a follow-up appointment in: 6 weeks.  Any Other Special Instructions Will Be Listed Below (If Applicable). Your physician recommends that you weigh, daily, at the same time every day, and in the same amount of clothing. Please record your daily weights on the handout provided and bring it to your next appointment. Please limit sodium in your diet as discussed at your visit. Please limit the amount of fluids you drink per day as discussed at your visit.   If you need a refill on your cardiac medications before your next appointment, please call your pharmacy.

## 2018-04-19 ENCOUNTER — Telehealth: Payer: Self-pay | Admitting: *Deleted

## 2018-04-19 DIAGNOSIS — I119 Hypertensive heart disease without heart failure: Secondary | ICD-10-CM | POA: Diagnosis not present

## 2018-04-19 DIAGNOSIS — I255 Ischemic cardiomyopathy: Secondary | ICD-10-CM | POA: Diagnosis not present

## 2018-04-19 NOTE — Telephone Encounter (Signed)
-----   Message from Jonelle Sidle, MD sent at 04/19/2018 11:10 AM EST ----- Results reviewed.  Creatinine 1.4 from 1.2 with normal potassium after switch to Entresto.  Continue same for now. A copy of this test should be forwarded to [name], MD.

## 2018-04-23 NOTE — Telephone Encounter (Signed)
Patient informed and copy sent to PCP. 

## 2018-05-08 ENCOUNTER — Telehealth: Payer: Self-pay | Admitting: Cardiology

## 2018-05-08 NOTE — Telephone Encounter (Signed)
Discussed Entresto with patient.  Stated he did use up the first free 30 days.  Explained to patient that [facility] & will need to check with them as to what is co-pay will be next.  Every insurance covers it differently.  States he will go to pharm now for more information.

## 2018-05-08 NOTE — Telephone Encounter (Signed)
Entresto  -  Patient can not afford medication wants to see if he can take something else

## 2018-05-08 NOTE — Telephone Encounter (Signed)
Patient returned call stating his co-pay will be $47.00.  Patient stated that he didn't think it was helping.  Explained to patient that he is on the lowest dose of this medication and may need to be increased.  Encouraged him to go ahead and get this prescription to take consistently till he sees Dr. Diona BrownerMcDowell on 05/24/2018.  At least give it a chance to see if it helps then he can discuss further with provider at next OV.  At that time, if SM wants him to stay on this medication & that co-pay becomes a hardship for him that we could try to apply for some assistance for him.  Patient agreed & verbalized understanding.

## 2018-05-23 DIAGNOSIS — I10 Essential (primary) hypertension: Secondary | ICD-10-CM | POA: Diagnosis not present

## 2018-05-23 DIAGNOSIS — M542 Cervicalgia: Secondary | ICD-10-CM | POA: Diagnosis not present

## 2018-05-23 DIAGNOSIS — E1121 Type 2 diabetes mellitus with diabetic nephropathy: Secondary | ICD-10-CM | POA: Diagnosis not present

## 2018-05-23 DIAGNOSIS — K210 Gastro-esophageal reflux disease with esophagitis: Secondary | ICD-10-CM | POA: Diagnosis not present

## 2018-05-23 NOTE — Progress Notes (Signed)
Cardiology Office Note  Date: 05/24/2018   ID: Allen [name], DOB 1933-12-28, MRN [id number]  PCP: [name], MD  Primary Cardiologist: [name], MD   Chief Complaint  Patient presents with  . Cardiomyopathy    History of Present Illness: Allen [name] is an 84 y.o. male last seen in October.  He presents today for a follow-up visit. Still reports shortness of breath, NYHA class III, no angina symptoms however.  No palpitations or syncope.  At the last visit a switch was made from lisinopril to [facility] and he was otherwise continued on aspirin, Plavix, Lipitor, and Norvasc.  He was kept off beta-blocker with history of bradycardia and chronic left bundle branch block.  Follow-up lab work is reviewed below.  Today I talked with him about other treatment options.  He does not look fluid overloaded at this point.  We do need to follow-up ischemic burden with history of multivessel CAD even in the absence of active angina at this time.  Consideration for biventricular ICD or pacemaker could also be at least discussed.  Past Medical History:  Diagnosis Date  . CAD (coronary artery disease)    Multivessel obstructive CAD involving the RCA, circumflex, and obtuse marginal system with only mild LAD disease May 2016  . Essential hypertension   . Hyperlipidemia   . Ischemic cardiomyopathy    LVEF 35% May 2016  . LBBB (left bundle branch block)   . Sinus bradycardia   . Type 2 diabetes mellitus (HCC)     Past Surgical History:  Procedure Laterality Date  . CARDIAC CATHETERIZATION N/A 11/02/2014   Procedure: Left Heart Cath and Coronary Angiography;  Surgeon: Lyn Records, MD;  Location[facility] INVASIVE CV LAB;  Service: Cardiovascular;  Laterality: N/A;  . CARDIAC CATHETERIZATION N/A 05/03/2016   Procedure: Left Heart Cath and Coronary Angiography;  Surgeon: Peter M Swaziland, MD;  Location: [facility] INVASIVE CV LAB;  Service: Cardiovascular;  Laterality: N/A;  . CORONARY ARTERY  BYPASS GRAFT N/A 05/08/2016   Procedure: CORONARY ARTERY BYPASS GRAFTING (CABG) x 5;  Surgeon: Alleen Borne, MD;  Location: [facility] OR;  Service: Open Heart Surgery;  Laterality: N/A;  . ENDOVEIN HARVEST OF GREATER SAPHENOUS VEIN  05/08/2016   Procedure: ENDOVEIN HARVEST OF GREATER SAPHENOUS VEIN;  Surgeon: Alleen Borne, MD;  Location: [facility] OR;  Service: Open Heart Surgery;;  . TEE WITHOUT CARDIOVERSION N/A 05/08/2016   Procedure: TRANSESOPHAGEAL ECHOCARDIOGRAM (TEE);  Surgeon: Alleen Borne, MD;  Location: [facility] OR;  Service: Open Heart Surgery;  Laterality: N/A;    Current Outpatient Medications  Medication Sig Dispense Refill  . amLODipine (NORVASC) 10 MG tablet TAKE 1 TABLET EVERY DAY 90 tablet 3  . aspirin 81 MG EC tablet Take 81 mg by mouth daily.    Marland Kitchen atorvastatin (LIPITOR) 80 MG tablet Take 1 tablet (80 mg total) by mouth daily at 6 PM. 90 tablet 3  . clopidogrel (PLAVIX) 75 MG tablet TAKE 1 TABLET ONE TIME DAILY WITH BREAKFAST 90 tablet 3  . furosemide (LASIX) 20 MG tablet Take 20 mg by mouth 2 (two) times daily.    Marland Kitchen glimepiride (AMARYL) 2 MG tablet Take 2 mg by mouth 2 (two) times daily.    . Insulin Glargine (LANTUS SOLOSTAR) 100 UNIT/ML Solostar Pen Inject 20 Units into the skin at bedtime. 15 mL 11  . pantoprazole (PROTONIX) 20 MG tablet Take 1 tablet (20 mg total) by mouth daily. 90 tablet 1  .  sacubitril-valsartan (ENTRESTO) 24-26 MG Take 1 tablet by mouth 2 (two) times daily. Start this 48 hours after stopping lisinopril-start taking this on Friday 04/05/18 60 tablet 6  . nitroGLYCERIN (NITROSTAT) 0.4 MG SL tablet Place 1 tablet (0.4 mg total) under the tongue every 5 (five) minutes x 3 doses as needed for chest pain (if no relief after 3rd dose, proceed to the ED for an evaluation). 75 tablet 1   No current facility-administered medications for this visit.    Allergies:  Patient has no known allergies.   Social History: The patient  reports that he quit smoking about 6 years  ago. His smoking use included cigarettes. He started smoking about 62 years ago. He has a 28.00 pack-year smoking history. He has never used smokeless tobacco. He reports that he does not drink alcohol or use drugs.   ROS:  Please see the history of present illness. Otherwise, complete review of systems is positive for fatigue.  All other systems are reviewed and negative.   Physical Exam: VS:  BP 130/82   Pulse (!) 45   Ht 5\' 2"  (1.575 m)   Wt 168 lb (76.2 kg)   SpO2 98%   BMI 30.73 kg/m , BMI Body mass index is 30.73 kg/m.  Wt Readings from Last 3 Encounters:  05/24/18 168 lb (76.2 kg)  04/02/18 178 lb (80.7 kg)  02/17/17 173 lb 1.6 oz (78.5 kg)    General: Elderly male, no acute distress. HEENT: Conjunctiva and lids normal, oropharynx clear. Neck: Supple, no elevated JVP or carotid bruits, no thyromegaly. Lungs: Decreased breath sounds without wheezing, nonlabored breathing at rest. Cardiac: Regular rate and rhythm, no S3 or significant systolic murmur. Abdomen: Soft, nontender, bowel sounds present. Extremities: No pitting edema, distal pulses 2+. Skin: Warm and dry. Musculoskeletal: No kyphosis. Neuropsychiatric: Alert and oriented x3, affect grossly appropriate.  ECG: I personally reviewed the tracing from 11/23/2017 which showed sinus rhythm with prolonged PR interval and left bundle branch block.  Recent Labwork:    Component Value Date/Time   CHOL 170 11/02/2014 0213   TRIG 121 11/02/2014 0213   HDL 45 11/02/2014 0213   CHOLHDL 3.8 11/02/2014 0213   VLDL 24 11/02/2014 0213   LDLCALC 101 (H) 11/02/2014 0213  November 2019: BUN 27, creatinine 1.43, potassium 4.0, sodium 142  Other Studies Reviewed Today:  Echocardiogram 03/07/2018 [facility]): Mild to moderate LVH with LVEF less than 20%, grade 1 diastolic dysfunction, mild to moderate left atrial enlargement, normal right ventricular contraction, mild aortic stenosis, mild mitral  regurgitation.  Assessment and Plan:  1.  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  LVEF approximately 20% by assessment in September.  He has undergone medication adjustments, now on Entresto with stable lab work.  Still symptomatic in terms of shortness of breath.  No active angina however.  Plan will be to obtain a Lexiscan Myoview to evaluate overall ischemic burden.  Also making referral to EP to at least discuss the possibility of a biventricular ICD or pacemaker with bradycardia and left bundle branch block (QRS greater than 150 ms).  2.  Multivessel CAD status post CABG in November 2017 with LIMA to the LAD, SVG to the diagonal, SVG to the RCA, and SVG to the OM1 and OM 2.  3.  Chronic left bundle branch block and history of bradycardia.  Not on beta-blocker.  4.  Mixed hyperlipidemia, on Lipitor. Following with Dr. Olena Leatherwood.  5.  Essential  hypertension.  Current medicines were reviewed with the patient today.   Orders Placed This Encounter  Procedures  . NM Myocar Multi W/Spect W/Wall Motion / EF  . Ambulatory referral to Cardiac Electrophysiology    Disposition: Follow-up in 4 to 6 weeks.  Signed, Jonelle SidleSamuel G. Jonathan Kirkendoll, MD, [facility] 05/24/2018 9:31 AM    [facility] [address], [place], [place] [address] Phone: [phone]) [phone]; Fax: [phone]

## 2018-05-24 ENCOUNTER — Encounter: Payer: Self-pay | Admitting: *Deleted

## 2018-05-24 ENCOUNTER — Encounter: Payer: Self-pay | Admitting: Cardiology

## 2018-05-24 ENCOUNTER — Telehealth: Payer: Self-pay | Admitting: Cardiology

## 2018-05-24 ENCOUNTER — Ambulatory Visit: Payer: Medicare PPO | Admitting: Cardiology

## 2018-05-24 VITALS — BP 130/82 | HR 45 | Ht 62.0 in | Wt 168.0 lb

## 2018-05-24 DIAGNOSIS — I447 Left bundle-branch block, unspecified: Secondary | ICD-10-CM | POA: Diagnosis not present

## 2018-05-24 DIAGNOSIS — E782 Mixed hyperlipidemia: Secondary | ICD-10-CM

## 2018-05-24 DIAGNOSIS — I25709 Atherosclerosis of coronary artery bypass graft(s), unspecified, with unspecified angina pectoris: Secondary | ICD-10-CM

## 2018-05-24 DIAGNOSIS — I255 Ischemic cardiomyopathy: Secondary | ICD-10-CM

## 2018-05-24 DIAGNOSIS — I10 Essential (primary) hypertension: Secondary | ICD-10-CM | POA: Diagnosis not present

## 2018-05-24 MED ORDER — NITROGLYCERIN 0.4 MG SL SUBL: 0.4000 mg | SUBLINGUAL_TABLET | SUBLINGUAL | 1 refills | Status: DC | PRN

## 2018-05-24 NOTE — Patient Instructions (Addendum)
Medication Instructions:   Your physician recommends that you continue on your current medications as directed. Please refer to the Current Medication list given to you today.  Labwork:  NONE  Testing/Procedures: Your physician has requested that you have a lexiscan myoview. For further information please visit [email or web address]. Please follow instruction sheet, as given.  Follow-Up:  Your physician recommends that you schedule a follow-up appointment in: 4-6 weeks.  Any Other Special Instructions Will Be Listed Below (If Applicable).  You have been referred to an EP (electrophysiologist) cardiologist  If you need a refill on your cardiac medications before your next appointment, please call your pharmacy.

## 2018-05-24 NOTE — Telephone Encounter (Signed)
°  Precert needed for: Lexiscan Myoview dx: ischemic cardiomyopathy  [facility]     Date: Jun 17, 2018

## 2018-06-06 ENCOUNTER — Telehealth: Payer: Self-pay | Admitting: Cardiology

## 2018-06-06 MED ORDER — SACUBITRIL-VALSARTAN 24-26 MG PO TABS: 1.0000 | ORAL_TABLET | Freq: Two times a day (BID) | ORAL | 6 refills | Status: DC

## 2018-06-06 NOTE — Telephone Encounter (Signed)
Done

## 2018-06-06 NOTE — Telephone Encounter (Signed)
°*  STAT* If patient is at the pharmacy, call can be transferred to refill team.   1. Which medications need to be refilled? (please list name of each medication and dose if known) Entresto  2. Which pharmacy/location (including street and city if local pharmacy) is medication to be sent to? [facility] [place]   3. Do they need a 30 day or 90 day supply?

## 2018-06-07 ENCOUNTER — Other Ambulatory Visit: Payer: Self-pay | Admitting: Cardiology

## 2018-06-17 ENCOUNTER — Encounter (HOSPITAL_COMMUNITY): Payer: Self-pay

## 2018-06-17 ENCOUNTER — Encounter (HOSPITAL_BASED_OUTPATIENT_CLINIC_OR_DEPARTMENT_OTHER)
Admission: RE | Admit: 2018-06-17 | Discharge: 2018-06-17 | Disposition: A | Discharge status: Home / Self Care | Payer: Medicare PPO | Source: Ambulatory Visit | Attending: Cardiology | Admitting: Cardiology

## 2018-06-17 ENCOUNTER — Encounter (HOSPITAL_COMMUNITY)
Admission: RE | Admit: 2018-06-17 | Discharge: 2018-06-17 | Disposition: A | Discharge status: Home / Self Care | Payer: Medicare PPO | Source: Ambulatory Visit | Attending: Cardiology | Admitting: Cardiology

## 2018-06-17 DIAGNOSIS — I255 Ischemic cardiomyopathy: Secondary | ICD-10-CM | POA: Diagnosis not present

## 2018-06-17 LAB — NM MYOCAR MULTI W/SPECT W/WALL MOTION / EF
LV dias vol: 226 mL (ref 62–150)
LVSYSVOL: 167 mL
Peak HR: 73 {beats}/min
RATE: 0.38
Rest HR: 47 {beats}/min
SDS: 2
SRS: 6
SSS: 8
TID: 0.99

## 2018-06-17 MED ORDER — REGADENOSON 0.4 MG/5ML IV SOLN
INTRAVENOUS | Status: AC
  Administered 2018-06-17: 0.4 mg via INTRAVENOUS
  Filled 2018-06-17: qty 5

## 2018-06-17 MED ORDER — TECHNETIUM TC 99M TETROFOSMIN IV KIT
10.0000 | PACK | Freq: Once | INTRAVENOUS | Status: AC | PRN
  Administered 2018-06-17: 10.11 via INTRAVENOUS

## 2018-06-17 MED ORDER — SODIUM CHLORIDE 0.9% FLUSH
INTRAVENOUS | Status: AC
  Administered 2018-06-17: 10 mL via INTRAVENOUS
  Filled 2018-06-17: qty 10

## 2018-06-17 MED ORDER — TECHNETIUM TC 99M TETROFOSMIN IV KIT
30.0000 | PACK | Freq: Once | INTRAVENOUS | Status: AC | PRN
  Administered 2018-06-17: 31 via INTRAVENOUS

## 2018-06-18 ENCOUNTER — Telehealth: Payer: Self-pay | Admitting: *Deleted

## 2018-06-18 NOTE — Telephone Encounter (Signed)
-----   Message from Jonelle SidleSamuel G McDowell, MD sent at 06/17/2018  4:27 PM EST ----- Results reviewed.  Stress test does not show active ischemic territories but previous infarct scars.  Reduced LVEF is already known by echocardiogram.  Continue with medical therapy and keep EP follow-up as scheduled. A copy of this test should be forwarded to Toma DeitersHasanaj, Xaje A, MD.

## 2018-06-18 NOTE — Telephone Encounter (Signed)
Patient informed. Copy sent to PCP °

## 2018-06-19 DIAGNOSIS — Z0000 Encounter for general adult medical examination without abnormal findings: Secondary | ICD-10-CM | POA: Diagnosis not present

## 2018-06-19 DIAGNOSIS — Z6828 Body mass index (BMI) 28.0-28.9, adult: Secondary | ICD-10-CM | POA: Diagnosis not present

## 2018-06-27 ENCOUNTER — Encounter: Payer: Self-pay | Admitting: Student

## 2018-06-27 ENCOUNTER — Ambulatory Visit: Payer: Medicare PPO | Admitting: Student

## 2018-06-27 VITALS — BP 112/76 | HR 77 | Ht 62.0 in | Wt 181.0 lb

## 2018-06-27 DIAGNOSIS — I255 Ischemic cardiomyopathy: Secondary | ICD-10-CM

## 2018-06-27 DIAGNOSIS — I25709 Atherosclerosis of coronary artery bypass graft(s), unspecified, with unspecified angina pectoris: Secondary | ICD-10-CM | POA: Diagnosis not present

## 2018-06-27 DIAGNOSIS — I10 Essential (primary) hypertension: Secondary | ICD-10-CM | POA: Diagnosis not present

## 2018-06-27 DIAGNOSIS — E782 Mixed hyperlipidemia: Secondary | ICD-10-CM | POA: Diagnosis not present

## 2018-06-27 DIAGNOSIS — I447 Left bundle-branch block, unspecified: Secondary | ICD-10-CM

## 2018-06-27 DIAGNOSIS — I5042 Chronic combined systolic (congestive) and diastolic (congestive) heart failure: Secondary | ICD-10-CM | POA: Diagnosis not present

## 2018-06-27 MED ORDER — AMLODIPINE BESYLATE 5 MG PO TABS: 5.0000 mg | ORAL_TABLET | Freq: Every day | ORAL | 3 refills | Status: DC

## 2018-06-27 NOTE — Patient Instructions (Signed)
Medication Instructions:  Your physician has recommended you make the following change in your medication:  Decrease Norvasc to 5 mg Daily  Monitor Blood Pressure and call results to office in 2 weeks.   If you need a refill on your cardiac medications before your next appointment, please call your pharmacy.   Lab work: NONE   If you have labs (blood work) drawn today and your tests are completely normal, you will receive your results only by: Marland Kitchen MyChart Message (if you have MyChart) OR . A paper copy in the mail If you have any lab test that is abnormal or we need to change your treatment, we will call you to review the results.  Testing/Procedures: NONE  Follow-Up: At [facility], you and your health needs are our priority.  As part of our continuing mission to provide you with exceptional heart care, we have created designated Provider Care Teams.  These Care Teams include your primary Cardiologist (physician) and Advanced Practice Providers (APPs -  Physician Assistants and Nurse Practitioners) who all work together to provide you with the care you need, when you need it. You will need a follow up appointment in 6-8 weeks.  Please call our office 2 months in advance to schedule this appointment.  You may see [name], MD or one of the following Advanced Practice Providers on your designated Care Team:   Randall An, PA-C [facility]) . Jacolyn Reedy, PA-C [facility])  Any Other Special Instructions Will Be Listed Below (If Applicable). Thank you for choosing [facility]!

## 2018-06-27 NOTE — Progress Notes (Signed)
Cardiology Office Note    Date:  06/27/2018   ID:  Allen [name], DOB 08-04-1933, MRN [id number]  PCP:  [name], MD  Cardiologist: [name], MD    Chief Complaint  Patient presents with  . Follow-up    4 week visit    History of Present Illness:    Allen [name] is a 85 y.o. male with past medical history of CAD (s/p CABG in 04/2016 with LIMA-LAD, SVG-D1, SVG-RCA, and seq SVG-OM1-OM2), ischemic cardiomyopathy, chronic combined systolic and diastolic CHF (EF 27-25% in 2018, further reduced at 20% by imaging in 03/2018), HTN, HLD, LBBB and Type 2 DM who presents to the office today for 4-week follow-up.  He was last examined by Dr. Diona Browner in 05/2018 and reported still having dyspnea on exertion but denied any recent chest pain or palpitations at that time. He was continued on Entresto along with ASA, Plavix, Lipitor, and Lasix with beta-blocker therapy not being initiated given his baseline bradycardia. Was referred to EP for consideration of biventricular ICD or pacemaker with baseline bradycardia and left bundle branch block. A NST was performed and showed findings consistent with prior infarction but no evidence of current ischemia. Was read as a high risk study based off of his reduced EF but no current myocardium was in jeopardy.  In talking with the patient today, he is initially somewhat frustrated as he has an office visit with myself today and then a new consult appointment with EP in [facility] tomorrow.  He reports being unaware of why he is having to attend so many visits and the indications behind this were reviewed with the patient today.  He does report having intermittent dyspnea which can occur at rest or with activity. Reports this is not consistent as he has been walking on a treadmill at [employer] for 30-minute intervals on a daily basis and sometimes does not experience symptoms with this but then can get short of breath when walking around his  house. He has baseline orthopnea but denies any recent chest pain, palpitations, lightheadedness, dizziness, presyncope, or lower extremity edema.  Reports that medications are managed by his niece and he is unsure of the specific medications he takes on a daily basis. He is concerned about the cost of Entresto as he had been informed by his pharmacist that this could be over $100 per month even with insurance coverage.  Past Medical History:  Diagnosis Date  . CAD (coronary artery disease)    a. 04/2016: s/p CABG with LIMA-LAD, SVG-D1, SVG-RCA, and seq SVG-OM1-OM2  . Essential hypertension   . Hyperlipidemia   . Ischemic cardiomyopathy    LVEF 35% May 2016  . LBBB (left bundle branch block)   . Sinus bradycardia   . Type 2 diabetes mellitus (HCC)     Past Surgical History:  Procedure Laterality Date  . CARDIAC CATHETERIZATION N/A 11/02/2014   Procedure: Left Heart Cath and Coronary Angiography;  Surgeon: Lyn Records, MD;  Location: [facility] INVASIVE CV LAB;  Service: Cardiovascular;  Laterality: N/A;  . CARDIAC CATHETERIZATION N/A 05/03/2016   Procedure: Left Heart Cath and Coronary Angiography;  Surgeon: Peter M Swaziland, MD;  Location: [facility] INVASIVE CV LAB;  Service: Cardiovascular;  Laterality: N/A;  . CORONARY ARTERY BYPASS GRAFT N/A 05/08/2016   Procedure: CORONARY ARTERY BYPASS GRAFTING (CABG) x 5;  Surgeon: Alleen Borne, MD;  Location: [facility] OR;  Service: Open Heart Surgery;  Laterality: N/A;  . ENDOVEIN HARVEST OF GREATER  SAPHENOUS VEIN  05/08/2016   Procedure: ENDOVEIN HARVEST OF GREATER SAPHENOUS VEIN;  Surgeon: Alleen Borne, MD;  Location: [facility] OR;  Service: Open Heart Surgery;;  . TEE WITHOUT CARDIOVERSION N/A 05/08/2016   Procedure: TRANSESOPHAGEAL ECHOCARDIOGRAM (TEE);  Surgeon: Alleen Borne, MD;  Location: [facility] OR;  Service: Open Heart Surgery;  Laterality: N/A;    Current Medications: Outpatient Medications Prior to Visit  Medication Sig Dispense Refill  . aspirin 81 MG EC  tablet Take 81 mg by mouth daily.    Marland Kitchen atorvastatin (LIPITOR) 80 MG tablet Take 1 tablet (80 mg total) by mouth daily at 6 PM. 90 tablet 3  . clopidogrel (PLAVIX) 75 MG tablet TAKE 1 TABLET ONE TIME DAILY WITH BREAKFAST 90 tablet 3  . furosemide (LASIX) 20 MG tablet Take 20 mg by mouth 2 (two) times daily.    Marland Kitchen glimepiride (AMARYL) 2 MG tablet Take 2 mg by mouth 2 (two) times daily.    . Insulin Glargine (LANTUS SOLOSTAR) 100 UNIT/ML Solostar Pen Inject 20 Units into the skin at bedtime. 15 mL 11  . nitroGLYCERIN (NITROSTAT) 0.4 MG SL tablet Place 1 tablet (0.4 mg total) under the tongue every 5 (five) minutes x 3 doses as needed for chest pain (if no relief after 3rd dose, proceed to the ED for an evaluation). 75 tablet 1  . pantoprazole (PROTONIX) 20 MG tablet Take 1 tablet (20 mg total) by mouth daily. 90 tablet 1  . sacubitril-valsartan (ENTRESTO) 24-26 MG Take 1 tablet by mouth 2 (two) times daily. Start this 48 hours after stopping lisinopril-start taking this on Friday 04/05/18 60 tablet 6  . amLODipine (NORVASC) 10 MG tablet TAKE 1 TABLET EVERY DAY 90 tablet 3  . lisinopril (PRINIVIL,ZESTRIL) 5 MG tablet Take by mouth.     No facility-administered medications prior to visit.      Allergies:   Patient has no known allergies.   Social History   Socioeconomic History  . Marital status: Married    Spouse name: Not on file  . Number of children: Not on file  . Years of education: Not on file  . Highest education level: Not on file  Occupational History  . Occupation: [name]    Comment: Retired  Engineer, production  . Financial resource strain: Not on file  . Food insecurity:    Worry: Not on file    Inability: Not on file  . Transportation needs:    Medical: Not on file    Non-medical: Not on file  Tobacco Use  . Smoking status: Former Smoker    Packs/day: 0.50    Years: 56.00    Pack years: 28.00    Types: Cigarettes    Start date: 02/18/1956    Last attempt to quit: 06/13/2011      Years since quitting: 7.0  . Smokeless tobacco: Never Used  Substance and Sexual Activity  . Alcohol use: No    Alcohol/week: 0.0 standard drinks  . Drug use: No  . Sexual activity: Never  Lifestyle  . Physical activity:    Days per week: Not on file    Minutes per session: Not on file  . Stress: Not on file  Relationships  . Social connections:    Talks on phone: Not on file    Gets together: Not on file    Attends religious service: Not on file    Active member of club or organization: Not on file    Attends meetings of  clubs or organizations: Not on file    Relationship status: Not on file  Other Topics Concern  . Not on file  Social History Narrative   Retired Naval architecttruck driver, 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     Family History:  The patient's family history includes Coronary artery disease in his brother.   Review of Systems:   Please see the history of present illness.     General:  No chills, fever, night sweats or weight changes.  Cardiovascular:  No chest pain, edema, palpitations, paroxysmal nocturnal dyspnea. Positive for dyspnea on exertion and orthopnea.  Dermatological: No rash, lesions/masses Respiratory: No cough, dyspnea Urologic: No hematuria, dysuria Abdominal:   No nausea, vomiting, diarrhea, bright red blood per rectum, melena, or hematemesis Neurologic:  No visual changes, wkns, changes in mental status. All other systems reviewed and are otherwise negative except as noted above.   Physical Exam:    VS:  BP 112/76   Pulse 77   Ht 5\' 2"  (1.575 m)   Wt 181 lb (82.1 kg)   SpO2 97%   BMI 33.11 kg/m    General: Well developed, elderly African American male appearing in no acute distress. Head: Normocephalic, atraumatic, sclera non-icteric, no xanthomas, nares are without discharge.  Neck: No carotid bruits. JVD not elevated.  Lungs: Respirations regular and unlabored, without wheezes or rales.  Heart: Regular rate and rhythm. No S3 or S4.  No murmur,  no rubs, or gallops appreciated. Abdomen: Soft, non-tender, non-distended with normoactive bowel sounds. No hepatomegaly. No rebound/guarding. No obvious abdominal masses. Msk:  Strength and tone appear normal for age. No joint deformities or effusions. Extremities: No clubbing or cyanosis. Trace ankle edema bilaterally.  Distal pedal pulses are 2+ bilaterally. Neuro: Alert and oriented X 3. Moves all extremities spontaneously. No focal deficits noted. Psych:  Responds to questions appropriately with a normal affect. Skin: No rashes or lesions noted  Wt Readings from Last 3 Encounters:  06/27/18 181 lb (82.1 kg)  05/24/18 168 lb (76.2 kg)  04/02/18 178 lb (80.7 kg)     Studies/Labs Reviewed:   EKG:  EKG is not ordered today.   Recent Labs: No results found for requested labs within last 8760 hours.   Lipid Panel    Component Value Date/Time   CHOL 170 11/02/2014 0213   TRIG 121 11/02/2014 0213   HDL 45 11/02/2014 0213   CHOLHDL 3.8 11/02/2014 0213   VLDL 24 11/02/2014 0213   LDLCALC 101 (H) 11/02/2014 0213    Additional studies/ records that were reviewed today include:   NST: 06/17/2018  There was no ST segment deviation noted during stress.  Findings consistent with anterior and basal inferior prior myocardial infarctions. No current ischemia.  This is a high risk study. Risk based on decreased LVEF, no current myocardium at jeopardy. Recommend correlating LVEF with echo.  The left ventricular ejection fraction is severely decreased (<30%).  Echocardiogram: 02/2017 Study Conclusions  - Left ventricle: The cavity size was normal. There was moderate   concentric hypertrophy. Systolic function was moderately reduced.   The estimated ejection fraction was in the range of 35% to 40%.   Hypokinesis of the anteroseptal, anterior, and apical myocardium. - Ventricular septum: Septal motion showed abnormal function and   dyssynergy. - Aortic valve: Trileaflet; normal  thickness, moderately calcified   leaflets. Valve mobility was trivially restricted. Transvalvular   velocity was minimally increased. There was very mild stenosis.   There was mild regurgitation. Valve area (VTI):  1.88 cm^2. Valve   area (Vmax): 1.65 cm^2. Valve area (Vmean): 1.78 cm^2. - Aortic root: The aortic root was mildly dilated. - Mitral valve: There was mild regurgitation. - Left atrium: The atrium was moderately dilated.  Echocardiogram: 03/2018   Assessment:    1. Chronic combined systolic and diastolic heart failure (HCC)   2. Ischemic cardiomyopathy   3. Coronary artery disease involving coronary bypass graft of native heart with angina pectoris (HCC)   4. LBBB (left bundle branch block)   5. Essential hypertension   6. Mixed hyperlipidemia      Plan:   In order of problems listed above:  1. Chronic Combined Systolic and Diastolic CHF/ Ischemic Cardiomyopathy - EF was previously 35-40% in 2018, further reduced at 20% by imaging in 03/2018. He reports intermittent dyspnea on exertion with baseline orthopnea but denies any recent PND or lower extremity edema. Reports weight has been stable on his home scales. He is currently taking Amlodipine 10 mg daily, Lasix 20 mg twice daily, and Entresto 24-26 mg twice daily. Not on beta-blocker therapy given prior issues of bradycardia. Will plan to decrease Amlodipine to 5 mg daily and follow BP with this. If overall stable, would titrate Entresto to 49-51 mg twice daily. Was provided with patient assistance information. He lives in [place] and reports traveling to appointments is difficult, therefore he wishes to follow BP at home and report back with his readings.  If Sherryll Burgerntresto is able to be titrated, he will need a repeat BMET in 2 weeks. If BP does not allow for this, would consider initiation of low-dose Spironolactone. He was previously referred to EP for consideration of resynchronization therapy and was informed to keep  scheduled follow-up for tomorrow.  2. CAD - s/p CABG in 04/2016 with LIMA-LAD, SVG-D1, SVG-RCA, and seq SVG-OM1-OM2. Recent NST earlier this month showed findings consistent with prior infarction but no evidence of current ischemia. Has been walking on the treadmill for 30-minute intervals without anginal symptoms.  - continue ASA, Plavix, and statin therapy. Not on BB therapy given prior bradycardia and chronic LBBB.   3. LBBB - noted on EKG's dating back to 2016.   4. HTN - BP is well controlled at 112/76 during today's visit. Will plan to decrease Amlodipine to 5 mg daily to allow for further titration of Entresto as outlined above.  5. HLD - Followed by PCP. Goal LDL is less than 70 in the setting of known CAD. He remains on Atorvastatin 80 mg daily.   Medication Adjustments/Labs and Tests Ordered: Current medicines are reviewed at length with the patient today.  Concerns regarding medicines are outlined above.  Medication changes, Labs and Tests ordered today are listed in the Patient Instructions below. Patient Instructions  Medication Instructions:  Your physician has recommended you make the following change in your medication:  Decrease Norvasc to 5 mg Daily  Monitor Blood Pressure and call results to office in 2 weeks.   If you need a refill on your cardiac medications before your next appointment, please call your pharmacy.   Lab work: NONE   If you have labs (blood work) drawn today and your tests are completely normal, you will receive your results only by: Marland Kitchen. MyChart Message (if you have MyChart) OR . A paper copy in the mail If you have any lab test that is abnormal or we need to change your treatment, we will call you to review the results.  Testing/Procedures: NONE  Follow-Up: At [facility],  you and your health needs are our priority.  As part of our continuing mission to provide you with exceptional heart care, we have created designated Provider Care Teams.   These Care Teams include your primary Cardiologist (physician) and Advanced Practice Providers (APPs -  Physician Assistants and Nurse Practitioners) who all work together to provide you with the care you need, when you need it. You will need a follow up appointment in 6-8 weeks.  Please call our office 2 months in advance to schedule this appointment.  You may see [name], MD or one of the following Advanced Practice Providers on your designated Care Team:   Randall An, PA-C [facility]) . Jacolyn Reedy, PA-C [facility] Office)  Any Other Special Instructions Will Be Listed Below (If Applicable). Thank you for choosing [facility]!     Signed, Ellsworth Lennox, PA-C  06/27/2018 4:03 PM     [facility] [address] [place], [place] [address] Phone: [phone] Fax: [phone]

## 2018-06-28 ENCOUNTER — Ambulatory Visit: Payer: Medicare PPO | Admitting: Internal Medicine

## 2018-06-28 ENCOUNTER — Encounter: Payer: Self-pay | Admitting: Internal Medicine

## 2018-06-28 VITALS — BP 100/62 | HR 51 | Ht 62.0 in | Wt 179.6 lb

## 2018-06-28 DIAGNOSIS — I255 Ischemic cardiomyopathy: Secondary | ICD-10-CM

## 2018-06-28 DIAGNOSIS — I447 Left bundle-branch block, unspecified: Secondary | ICD-10-CM | POA: Diagnosis not present

## 2018-06-28 NOTE — Patient Instructions (Addendum)
Medication Instructions:  Your physician recommends that you continue on your current medications as directed. Please refer to the Current Medication list given to you today.  Labwork: You will get lab work today:  BMP and CBC.  Testing/Procedures: Your physician has recommended that you have a defibrillator inserted. An implantable cardioverter defibrillator (ICD) is a small device that is placed in your chest or, in rare cases, your abdomen. This device uses electrical pulses or shocks to help control life-threatening, irregular heartbeats that could lead the heart to suddenly stop beating (sudden cardiac arrest). Leads are attached to the ICD that goes into your heart. This is done in the hospital and usually requires an overnight stay. Please see the instruction sheet given to you today for more information.  Follow-Up: You will follow up with device clinic 10-14 days after your procedure for a wound check.  You will follow up with Dr. Johney [name] 91 days after your procedure.   Defibrillator instructions:  Please arrive to ADM[facility]ING [facility] of [facility] at:  11:00 am on July 17, 2018  Use the CHG surgical scrub as directed  Do not eat or drink after midnight prior to procedure  Do not take any medications the morning of the procedure Hold your PLAVIX for 5 days.  Your last dose will be July 11, 2018  Plan for one night stay  You will need someone to drive you home at discharge  If you need a refill on your cardiac medications before your next appointment, please call your pharmacy.   Cardioverter Defibrillator Implantation  An implantable cardioverter defibrillator (ICD) is a small device that is placed under the skin in the chest or abdomen. An ICD consists of a battery, a small computer (pulse generator), and wires (leads) that go into the heart. An ICD is used to detect and correct two types of dangerous irregular  heartbeats (arrhythmias):  A rapid heart rhythm (tachycardia).  An arrhythmia in which the lower chambers of the heart (ventricles) contract in an uncoordinated way (fibrillation). When an ICD detects tachycardia, it sends a low-energy shock to the heart to restore the heartbeat to normal (cardioversion). This signal is usually painless. If cardioversion does not work or if the ICD detects fibrillation, it delivers a high-energy shock to the heart (defibrillation) to restart the heart. This shock may feel like a strong jolt in the chest. Your health care provider may prescribe an ICD if:  You have had an arrhythmia that originated in the ventricles.  Your heart has been damaged by a disease or heart condition. Sometimes, ICDs are programmed to act as a device called a pacemaker. Pacemakers can be used to treat a slow heartbeat (bradycardia) or tachycardia by taking over the heart rate with electrical impulses. Tell a health care provider about:  Any allergies you have.  All medicines you are taking, including vitamins, herbs, eye drops, creams, and over-the-counter medicines.  Any problems you or family members have had with anesthetic medicines.  Any blood disorders you have.  Any surgeries you have had.  Any medical conditions you have.  Whether you are pregnant or may be pregnant. What are the risks? Generally, this is a safe procedure. However, problems may occur, including:  Swelling, bleeding, or bruising.  Infection.  Blood clots.  Damage to other structures or organs, such as nerves, blood vessels, or the heart.  Allergic reactions to medicines used during the procedure. What happens before the  procedure? Medicine Ask your health care provider about:  Changing or stopping your normal medicines. This is important if you take diabetes medicines or blood thinners.  Taking medicines such as aspirin and ibuprofen. These medicines can thin your blood. Do not take these  medicines before your procedure if your doctor tells you not to. Tests  You may have blood tests.  You may have a test to check the electrical signals in your heart (electrocardiogram, ECG).  You may have imaging tests, such as a chest X-ray. General instructions  For 24 hours before the procedure, stop using products that contain nicotine or tobacco, such as cigarettes and e-cigarettes. If you need help quitting, ask your health care provider.  Plan to have someone take you home from the hospital or clinic.  You may be asked to shower with a germ-killing soap. What happens during the procedure?  To reduce your risk of infection: ? Your health care team will wash or sanitize their hands. ? Your skin will be washed with soap. ? Hair may be removed from the surgical area.  Small monitors will be put on your body. They will be used to check your heart, blood pressure, and oxygen level.  An IV tube will be inserted into one of your veins.  You will be given one or more of the following: ? A medicine to help you relax (sedative). ? A medicine to numb the area (local anesthetic). ? A medicine to make you fall asleep (general anesthetic).  Leads will be guided through a blood vessel into your heart and attached to your heart muscles. Depending on the ICD, the leads may go into one ventricle or they may go into both ventricles and into an upper chamber of the heart. An X-ray machine (fluoroscope) will be usedto help guide the leads.  A small incision will be made to create a deep pocket under your skin.  The pulse generator will be placed into the pocket.  The ICD will be tested.  The incision will be closed with stitches (sutures), skin glue, or staples.  A bandage (dressing) will be placed over the incision. This procedure may vary among health care providers and hospitals. What happens after the procedure?  Your blood pressure, heart rate, breathing rate, and blood oxygen  level will be monitored often until the medicines you were given have worn off.  A chest X-ray will be taken to check that the ICD is in the right place.  You will need to stay in the hospital for 1-2 days so your health care provider can make sure your ICD is working.  Do not drive for 24 hours if you received a sedative. Ask your health care provider when it is safe for you to drive.  You may be given an identification card explaining that you have an ICD. Summary  An implantable cardioverter defibrillator (ICD) is a small device that is placed under the skin in the chest or abdomen. It is used to detect and correct dangerous irregular heartbeats (arrhythmias).  An ICD consists of a battery, a small computer (pulse generator), and wires (leads) that go into the heart.  When an ICD detects rapid heart rhythm (tachycardia), it sends a low-energy shock to the heart to restore the heartbeat to normal (cardioversion). If cardioversion does not work or if the ICD detects uncoordinated heart contractions (fibrillation), it delivers a high-energy shock to the heart (defibrillation) to restart the heart.  You will need to stay in the  hospital for 1-2 days to make sure your ICD is working. This information is not intended to replace advice given to you by your health care provider. Make sure you discuss any questions you have with your health care provider. Document Released: 02/18/2002 Document Revised: 06/07/2016 Document Reviewed: 06/07/2016 Elsevier Interactive Patient Education  2019 [employer].

## 2018-06-28 NOTE — Progress Notes (Signed)
Electrophysiology Office Note   Date:  06/28/2018   ID:  Allen [name], DOB 1933-09-21, MRN [id number]  PCP:  [name], MD  Cardiologist:  Dr Diona Browner Primary Electrophysiologist: Hillis Range, MD    CC: CHF   History of Present Illness: Allen [name] is a 85 y.o. male who presents today for electrophysiology evaluation.   He is referred by Dr Diona Browner for EP consultation regarding further risk stratification of sudden death.  He has a h/o bradycardia and LBBB.  He has an ischemic CM (EF <20%).  He is s/p CABG in 2017.  Myoview 06/17/2018 reveals scar, without ischemia.  He has been treated with an optimal medical therapy for his CHF however, he cannot take beta blockers due to bradycardia. He is active for his age.  He has SOB with moderate activity as well as 1 pillow orthopnea.  Today, he denies symptoms of palpitations, chest pain,  lower extremity edema, claudication, dizziness, presyncope, syncope, bleeding, or neurologic sequela. The patient is tolerating medications without difficulties and is otherwise without complaint today.    Past Medical History:  Diagnosis Date  . CAD (coronary artery disease)    a. 04/2016: s/p CABG with LIMA-LAD, SVG-D1, SVG-RCA, and seq SVG-OM1-OM2  . Essential hypertension   . Hyperlipidemia   . Ischemic cardiomyopathy    LVEF 35% May 2016  . LBBB (left bundle branch block)   . Sinus bradycardia   . Type 2 diabetes mellitus (HCC)    Past Surgical History:  Procedure Laterality Date  . CARDIAC CATHETERIZATION N/A 11/02/2014   Procedure: Left Heart Cath and Coronary Angiography;  Surgeon: Lyn Records, MD;  Location: [facility] INVASIVE CV LAB;  Service: Cardiovascular;  Laterality: N/A;  . CARDIAC CATHETERIZATION N/A 05/03/2016   Procedure: Left Heart Cath and Coronary Angiography;  Surgeon: Peter M Swaziland, MD;  Location: [facility] INVASIVE CV LAB;  Service: Cardiovascular;  Laterality: N/A;  . CORONARY ARTERY BYPASS GRAFT N/A 05/08/2016   Procedure: CORONARY ARTERY BYPASS GRAFTING (CABG) x 5;  Surgeon: Alleen Borne, MD;  Location: [facility] OR;  Service: Open Heart Surgery;  Laterality: N/A;  . ENDOVEIN HARVEST OF GREATER SAPHENOUS VEIN  05/08/2016   Procedure: ENDOVEIN HARVEST OF GREATER SAPHENOUS VEIN;  Surgeon: Alleen Borne, MD;  Location: [facility] OR;  Service: Open Heart Surgery;;  . TEE WITHOUT CARDIOVERSION N/A 05/08/2016   Procedure: TRANSESOPHAGEAL ECHOCARDIOGRAM (TEE);  Surgeon: Alleen Borne, MD;  Location[facility] OR;  Service: Open Heart Surgery;  Laterality: N/A;     Current Outpatient Medications  Medication Sig Dispense Refill  . amLODipine (NORVASC) 5 MG tablet Take 1 tablet (5 mg total) by mouth daily. 90 tablet 3  . aspirin 81 MG EC tablet Take 81 mg by mouth daily.    Marland Kitchen atorvastatin (LIPITOR) 80 MG tablet Take 1 tablet (80 mg total) by mouth daily at 6 PM. 90 tablet 3  . clopidogrel (PLAVIX) 75 MG tablet TAKE 1 TABLET ONE TIME DAILY WITH BREAKFAST 90 tablet 3  . furosemide (LASIX) 20 MG tablet Take 20 mg by mouth 2 (two) times daily.    Marland Kitchen glimepiride (AMARYL) 2 MG tablet Take 2 mg by mouth 2 (two) times daily.    . Insulin Glargine (LANTUS SOLOSTAR) 100 UNIT/ML Solostar Pen Inject 20 Units into the skin at bedtime. 15 mL 11  . nitroGLYCERIN (NITROSTAT) 0.4 MG SL tablet Place 1 tablet (0.4 mg total) under the tongue every 5 (five) minutes x 3 doses as needed  for chest pain (if no relief after 3rd dose, proceed to the ED for an evaluation). 75 tablet 1  . pantoprazole (PROTONIX) 20 MG tablet Take 1 tablet (20 mg total) by mouth daily. 90 tablet 1  . sacubitril-valsartan (ENTRESTO) 24-26 MG Take 1 tablet by mouth 2 (two) times daily. Start this 48 hours after stopping lisinopril-start taking this on Friday 04/05/18 60 tablet 6   No current facility-administered medications for this visit.     Allergies:   Patient has no known allergies.   Social History:  The patient  reports that he quit smoking about 7 years ago. His  smoking use included cigarettes. He started smoking about 62 years ago. He has a 28.00 pack-year smoking history. He has never used smokeless tobacco. He reports that he does not drink alcohol or use drugs.   Family History:  The patient's  family history includes Coronary artery disease in his brother.    ROS:  Please see the history of present illness.   All other systems are personally reviewed and negative.    PHYSICAL EXAM: VS:  BP 100/62   Pulse (!) 51   Ht 5' 2" (1.575 m)   Wt 179 lb 9.6 oz (81.5 kg)   SpO2 98%   BMI 32.85 kg/m  , BMI Body mass index is 32.85 kg/m. GEN: Well nourished, well developed, in no acute distress  HEENT: normal  Neck: no JVD, carotid bruits, or masses Cardiac: RRR; no murmurs, rubs, or gallops,no edema  Respiratory:  clear to auscultation bilaterally, normal work of breathing GI: soft, nontender, nondistended, + BS MS: no deformity or atrophy  Skin: warm and dry  Neuro:  Strength and sensation are intact Psych: euthymic mood, full affect  EKG:  EKG is ordered today. The ekg ordered today is personally reviewed and shows sinus rhythm 51 bpm, PR 214 msec, LBBB (QRS 180 msec)   Lipid Panel     Component Value Date/Time   CHOL 170 11/02/2014 0213   TRIG 121 11/02/2014 0213   HDL 45 11/02/2014 0213   CHOLHDL 3.8 11/02/2014 0213   VLDL 24 11/02/2014 0213   LDLCALC 101 (H) 11/02/2014 0213   personally reviewed   Wt Readings from Last 3 Encounters:  06/28/18 179 lb 9.6 oz (81.5 kg)  06/27/18 181 lb (82.1 kg)  05/24/18 168 lb (76.2 kg)    Other studies personally reviewed: Additional studies/ records that were reviewed today include: prior echo, Dr McDowell's notes, Brittany Strader's notes  Review of the above records today demonstrates: as above   ASSESSMENT AND PLAN:  1.  Ischemic CM/ LBBB/ sinus bradycardia The patient has an ischemic CM (EF <20%), NYHA Class III CHF, and CAD.  He also has a LBBB with QRS > 150 msec.  He is referred  by Dr McDowell for risk stratification of sudden death and consideration of ICD implantation.  At this time, he meets MADIT II/ SCD-HeFT criteria for ICD implantation for primary prevention of sudden death. Given LBBB with QRS > 150 msec, he may also benefit from CRT.   I have had a thorough discussion with the patient reviewing options.  The patient and their neice have had opportunities to ask questions and have them answered. The patient and I have decided together through a shared decision making process to BiV ICD ICD at this time.   Risks, benefits, alternatives to BiV ICD implantation were discussed in detail with the patient today. The patient understands that the risks include   but are not limited to bleeding, infection, pneumothorax, perforation, tamponade, vascular damage, renal failure, MI, stroke, death, inappropriate shocks, and lead dislodgement and wishes to proceed.  We will therefore schedule device implantation at the next available time.    Current medicines are reviewed at length with the patient today.   The patient does not have concerns regarding his medicines.  The following changes were made today:  none    Signed, Avionna Bower, MD  06/28/2018 11:12 AM     [facility] [address] [place]  [address] [phone] (office) [phone] (fax) 

## 2018-06-28 NOTE — H&P (View-Only) (Signed)
Electrophysiology Office Note   Date:  06/28/2018   ID:  Allen [name], DOB 1933-09-21, MRN [id number]  PCP:  [name], MD  Cardiologist:  Dr Diona Browner Primary Electrophysiologist: Hillis Range, MD    CC: CHF   History of Present Illness: Allen [name] is a 85 y.o. male who presents today for electrophysiology evaluation.   He is referred by Dr Diona Browner for EP consultation regarding further risk stratification of sudden death.  He has a h/o bradycardia and LBBB.  He has an ischemic CM (EF <20%).  He is s/p CABG in 2017.  Myoview 06/17/2018 reveals scar, without ischemia.  He has been treated with an optimal medical therapy for his CHF however, he cannot take beta blockers due to bradycardia. He is active for his age.  He has SOB with moderate activity as well as 1 pillow orthopnea.  Today, he denies symptoms of palpitations, chest pain,  lower extremity edema, claudication, dizziness, presyncope, syncope, bleeding, or neurologic sequela. The patient is tolerating medications without difficulties and is otherwise without complaint today.    Past Medical History:  Diagnosis Date  . CAD (coronary artery disease)    a. 04/2016: s/p CABG with LIMA-LAD, SVG-D1, SVG-RCA, and seq SVG-OM1-OM2  . Essential hypertension   . Hyperlipidemia   . Ischemic cardiomyopathy    LVEF 35% May 2016  . LBBB (left bundle branch block)   . Sinus bradycardia   . Type 2 diabetes mellitus (HCC)    Past Surgical History:  Procedure Laterality Date  . CARDIAC CATHETERIZATION N/A 11/02/2014   Procedure: Left Heart Cath and Coronary Angiography;  Surgeon: Lyn Records, MD;  Location: [facility] INVASIVE CV LAB;  Service: Cardiovascular;  Laterality: N/A;  . CARDIAC CATHETERIZATION N/A 05/03/2016   Procedure: Left Heart Cath and Coronary Angiography;  Surgeon: Peter M Swaziland, MD;  Location: [facility] INVASIVE CV LAB;  Service: Cardiovascular;  Laterality: N/A;  . CORONARY ARTERY BYPASS GRAFT N/A 05/08/2016   Procedure: CORONARY ARTERY BYPASS GRAFTING (CABG) x 5;  Surgeon: Alleen Borne, MD;  Location: [facility] OR;  Service: Open Heart Surgery;  Laterality: N/A;  . ENDOVEIN HARVEST OF GREATER SAPHENOUS VEIN  05/08/2016   Procedure: ENDOVEIN HARVEST OF GREATER SAPHENOUS VEIN;  Surgeon: Alleen Borne, MD;  Location: [facility] OR;  Service: Open Heart Surgery;;  . TEE WITHOUT CARDIOVERSION N/A 05/08/2016   Procedure: TRANSESOPHAGEAL ECHOCARDIOGRAM (TEE);  Surgeon: Alleen Borne, MD;  Location: [facility] OR;  Service: Open Heart Surgery;  Laterality: N/A;     Current Outpatient Medications  Medication Sig Dispense Refill  . amLODipine (NORVASC) 5 MG tablet Take 1 tablet (5 mg total) by mouth daily. 90 tablet 3  . aspirin 81 MG EC tablet Take 81 mg by mouth daily.    Marland Kitchen atorvastatin (LIPITOR) 80 MG tablet Take 1 tablet (80 mg total) by mouth daily at 6 PM. 90 tablet 3  . clopidogrel (PLAVIX) 75 MG tablet TAKE 1 TABLET ONE TIME DAILY WITH BREAKFAST 90 tablet 3  . furosemide (LASIX) 20 MG tablet Take 20 mg by mouth 2 (two) times daily.    Marland Kitchen glimepiride (AMARYL) 2 MG tablet Take 2 mg by mouth 2 (two) times daily.    . Insulin Glargine (LANTUS SOLOSTAR) 100 UNIT/ML Solostar Pen Inject 20 Units into the skin at bedtime. 15 mL 11  . nitroGLYCERIN (NITROSTAT) 0.4 MG SL tablet Place 1 tablet (0.4 mg total) under the tongue every 5 (five) minutes x 3 doses as needed  for chest pain (if no relief after 3rd dose, proceed to the ED for an evaluation). 75 tablet 1  . pantoprazole (PROTONIX) 20 MG tablet Take 1 tablet (20 mg total) by mouth daily. 90 tablet 1  . sacubitril-valsartan (ENTRESTO) 24-26 MG Take 1 tablet by mouth 2 (two) times daily. Start this 48 hours after stopping lisinopril-start taking this on Friday 04/05/18 60 tablet 6   No current facility-administered medications for this visit.     Allergies:   Patient has no known allergies.   Social History:  The patient  reports that he quit smoking about 7 years ago. His  smoking use included cigarettes. He started smoking about 62 years ago. He has a 28.00 pack-year smoking history. He has never used smokeless tobacco. He reports that he does not drink alcohol or use drugs.   Family History:  The patient's  family history includes Coronary artery disease in his brother.    ROS:  Please see the history of present illness.   All other systems are personally reviewed and negative.    PHYSICAL EXAM: VS:  BP 100/62   Pulse (!) 51   Ht 5\' 2"  (1.575 m)   Wt 179 lb 9.6 oz (81.5 kg)   SpO2 98%   BMI 32.85 kg/m  , BMI Body mass index is 32.85 kg/m. GEN: Well nourished, well developed, in no acute distress  HEENT: normal  Neck: no JVD, carotid bruits, or masses Cardiac: RRR; no murmurs, rubs, or gallops,no edema  Respiratory:  clear to auscultation bilaterally, normal work of breathing GI: soft, nontender, nondistended, + BS MS: no deformity or atrophy  Skin: warm and dry  Neuro:  Strength and sensation are intact Psych: euthymic mood, full affect  EKG:  EKG is ordered today. The ekg ordered today is personally reviewed and shows sinus rhythm 51 bpm, PR 214 msec, LBBB (QRS 180 msec)   Lipid Panel     Component Value Date/Time   CHOL 170 11/02/2014 0213   TRIG 121 11/02/2014 0213   HDL 45 11/02/2014 0213   CHOLHDL 3.8 11/02/2014 0213   VLDL 24 11/02/2014 0213   LDLCALC 101 (H) 11/02/2014 0213   personally reviewed   Wt Readings from Last 3 Encounters:  06/28/18 179 lb 9.6 oz (81.5 kg)  06/27/18 181 lb (82.1 kg)  05/24/18 168 lb (76.2 kg)    Other studies personally reviewed: Additional studies/ records that were reviewed today include: prior echo, Dr McDowell's notes, Glenford PeersBrittany Strader's notes  Review of the above records today demonstrates: as above   ASSESSMENT AND PLAN:  1.  Ischemic CM/ LBBB/ sinus bradycardia The patient has an ischemic CM (EF <20%), NYHA Class III CHF, and CAD.  He also has a LBBB with QRS > 150 msec.  He is referred  by Dr Diona BrownerMcDowell for risk stratification of sudden death and consideration of ICD implantation.  At this time, he meets MADIT II/ SCD-HeFT criteria for ICD implantation for primary prevention of sudden death. Given LBBB with QRS > 150 msec, he may also benefit from CRT.   I have had a thorough discussion with the patient reviewing options.  The patient and their neice have had opportunities to ask questions and have them answered. The patient and I have decided together through a shared decision making process to BiV ICD ICD at this time.   Risks, benefits, alternatives to BiV ICD implantation were discussed in detail with the patient today. The patient understands that the risks include  but are not limited to bleeding, infection, pneumothorax, perforation, tamponade, vascular damage, renal failure, MI, stroke, death, inappropriate shocks, and lead dislodgement and wishes to proceed.  We will therefore schedule device implantation at the next available time.    Current medicines are reviewed at length with the patient today.   The patient does not have concerns regarding his medicines.  The following changes were made today:  none    Signed, Hillis Range, MD  06/28/2018 11:12 AM     [facility] [address] [place] [address] [phone] (office) [phone] (fax)

## 2018-06-29 LAB — CBC WITH DIFFERENTIAL/PLATELET
BASOS ABS: 0 10*3/uL (ref 0.0–0.2)
Basos: 0 %
EOS (ABSOLUTE): 0.1 10*3/uL (ref 0.0–0.4)
Eos: 1 %
Hematocrit: 45.1 % (ref 37.5–51.0)
Hemoglobin: 14.8 g/dL (ref 13.0–17.7)
Immature Grans (Abs): 0 10*3/uL (ref 0.0–0.1)
Immature Granulocytes: 0 %
Lymphocytes Absolute: 4.3 10*3/uL — ABNORMAL HIGH (ref 0.7–3.1)
Lymphs: 63 %
MCH: 27.9 pg (ref 26.6–33.0)
MCHC: 32.8 g/dL (ref 31.5–35.7)
MCV: 85 fL (ref 79–97)
Monocytes Absolute: 0.6 10*3/uL (ref 0.1–0.9)
Monocytes: 8 %
Neutrophils Absolute: 2 10*3/uL (ref 1.4–7.0)
Neutrophils: 28 %
Platelets: 268 10*3/uL (ref 150–450)
RBC: 5.31 x10E6/uL (ref 4.14–5.80)
RDW: 13.7 % (ref 11.6–15.4)
WBC: 7 10*3/uL (ref 3.4–10.8)

## 2018-06-29 LAB — BASIC METABOLIC PANEL
BUN/Creatinine Ratio: 12 (ref 10–24)
BUN: 17 mg/dL (ref 8–27)
CHLORIDE: 103 mmol/L (ref 96–106)
CO2: 23 mmol/L (ref 20–29)
Calcium: 11.3 mg/dL — ABNORMAL HIGH (ref 8.6–10.2)
Creatinine, Ser: 1.43 mg/dL — ABNORMAL HIGH (ref 0.76–1.27)
GFR calc Af Amer: 52 mL/min/{1.73_m2} — ABNORMAL LOW (ref >59)
GFR calc non Af Amer: 45 mL/min/{1.73_m2} — ABNORMAL LOW (ref >59)
Glucose: 98 mg/dL (ref 65–99)
Potassium: 4.5 mmol/L (ref 3.5–5.2)
Sodium: 144 mmol/L (ref 134–144)

## 2018-07-12 ENCOUNTER — Telehealth: Payer: Self-pay | Admitting: Student

## 2018-07-12 NOTE — Telephone Encounter (Signed)
Spoke with pt. Pt informed of notes. Voiced understanding

## 2018-07-12 NOTE — Telephone Encounter (Signed)
    Please let the patient know I reviewed his BP log. His SBP has overall been stable in the 120's to 150's and diastolic readings in the 80's to 90's but this does not correlate with readings we have obtained here (112/76 on 06/27/2018 and 100/62 on 06/28/2018). Given the variability, would not titrate Entresto at this time. Would encourage him to bring his home BP cuff to his next appointment so we can check his device against one of ours as I do not want to increase his medication and cause worsening hypotension.   Signed, Ellsworth Lennox, PA-C 07/12/2018, 12:21 PM Pager: [phone]

## 2018-07-12 NOTE — Telephone Encounter (Signed)
Called pt no answer, left msg to call back  

## 2018-07-17 ENCOUNTER — Ambulatory Visit (HOSPITAL_COMMUNITY)
Admission: RE | Disposition: A | Discharge status: Home / Self Care | Payer: Self-pay | Source: Home / Self Care | Attending: Internal Medicine

## 2018-07-17 ENCOUNTER — Ambulatory Visit (HOSPITAL_COMMUNITY)
Admission: RE | Admit: 2018-07-17 | Discharge: 2018-07-18 | Disposition: A | Discharge status: Home / Self Care | Payer: Medicare PPO | Attending: Internal Medicine | Admitting: Internal Medicine

## 2018-07-17 ENCOUNTER — Other Ambulatory Visit: Payer: Self-pay

## 2018-07-17 ENCOUNTER — Encounter (HOSPITAL_COMMUNITY): Payer: Self-pay | Admitting: General Practice

## 2018-07-17 DIAGNOSIS — Z95810 Presence of automatic (implantable) cardiac defibrillator: Secondary | ICD-10-CM

## 2018-07-17 DIAGNOSIS — I495 Sick sinus syndrome: Secondary | ICD-10-CM | POA: Insufficient documentation

## 2018-07-17 DIAGNOSIS — Z79899 Other long term (current) drug therapy: Secondary | ICD-10-CM | POA: Insufficient documentation

## 2018-07-17 DIAGNOSIS — Z7902 Long term (current) use of antithrombotics/antiplatelets: Secondary | ICD-10-CM | POA: Insufficient documentation

## 2018-07-17 DIAGNOSIS — E119 Type 2 diabetes mellitus without complications: Secondary | ICD-10-CM | POA: Insufficient documentation

## 2018-07-17 DIAGNOSIS — I509 Heart failure, unspecified: Secondary | ICD-10-CM | POA: Insufficient documentation

## 2018-07-17 DIAGNOSIS — I255 Ischemic cardiomyopathy: Secondary | ICD-10-CM | POA: Diagnosis not present

## 2018-07-17 DIAGNOSIS — Z8249 Family history of ischemic heart disease and other diseases of the circulatory system: Secondary | ICD-10-CM | POA: Diagnosis not present

## 2018-07-17 DIAGNOSIS — Z006 Encounter for examination for normal comparison and control in clinical research program: Secondary | ICD-10-CM | POA: Insufficient documentation

## 2018-07-17 DIAGNOSIS — Z951 Presence of aortocoronary bypass graft: Secondary | ICD-10-CM | POA: Insufficient documentation

## 2018-07-17 DIAGNOSIS — Z87891 Personal history of nicotine dependence: Secondary | ICD-10-CM | POA: Insufficient documentation

## 2018-07-17 DIAGNOSIS — E785 Hyperlipidemia, unspecified: Secondary | ICD-10-CM | POA: Insufficient documentation

## 2018-07-17 DIAGNOSIS — I2510 Atherosclerotic heart disease of native coronary artery without angina pectoris: Secondary | ICD-10-CM | POA: Diagnosis not present

## 2018-07-17 DIAGNOSIS — Z794 Long term (current) use of insulin: Secondary | ICD-10-CM | POA: Diagnosis not present

## 2018-07-17 DIAGNOSIS — Z7982 Long term (current) use of aspirin: Secondary | ICD-10-CM | POA: Diagnosis not present

## 2018-07-17 DIAGNOSIS — Z959 Presence of cardiac and vascular implant and graft, unspecified: Secondary | ICD-10-CM

## 2018-07-17 DIAGNOSIS — I447 Left bundle-branch block, unspecified: Secondary | ICD-10-CM | POA: Diagnosis not present

## 2018-07-17 DIAGNOSIS — I110 Hypertensive heart disease with heart failure: Secondary | ICD-10-CM | POA: Diagnosis not present

## 2018-07-17 DIAGNOSIS — I5022 Chronic systolic (congestive) heart failure: Secondary | ICD-10-CM | POA: Diagnosis not present

## 2018-07-17 DIAGNOSIS — I519 Heart disease, unspecified: Secondary | ICD-10-CM | POA: Diagnosis present

## 2018-07-17 HISTORY — DX: Presence of automatic (implantable) cardiac defibrillator: Z95.810

## 2018-07-17 HISTORY — PX: BIV ICD INSERTION CRT-D: EP1195

## 2018-07-17 LAB — SURGICAL PCR SCREEN
MRSA, PCR: NEGATIVE
Staphylococcus aureus: NEGATIVE

## 2018-07-17 LAB — GLUCOSE, CAPILLARY
Glucose-Capillary: 103 mg/dL — ABNORMAL HIGH (ref 70–99)
Glucose-Capillary: 99 mg/dL (ref 70–99)
Glucose-Capillary: 264 mg/dL — ABNORMAL HIGH (ref 70–99)

## 2018-07-17 SURGERY — BIV ICD INSERTION CRT-D

## 2018-07-17 MED ORDER — IOPAMIDOL (ISOVUE-370) INJECTION 76%
INTRAVENOUS | Status: DC | PRN
  Administered 2018-07-17: 15 mL via INTRAVENOUS
  Administered 2018-07-17: 10 mL via INTRAVENOUS

## 2018-07-17 MED ORDER — CEFAZOLIN SODIUM-DEXTROSE 2-4 GM/100ML-% IV SOLN
2.0000 g | INTRAVENOUS | Status: AC
  Administered 2018-07-17: 2 g via INTRAVENOUS
  Filled 2018-07-17: qty 100

## 2018-07-17 MED ORDER — SODIUM CHLORIDE 0.9 % IV SOLN
80.0000 mg | INTRAVENOUS | Status: AC
  Administered 2018-07-17: 80 mg
  Filled 2018-07-17: qty 2

## 2018-07-17 MED ORDER — ONDANSETRON HCL 4 MG/2ML IJ SOLN: 4.0000 mg | Freq: Four times a day (QID) | INTRAMUSCULAR | Status: DC | PRN

## 2018-07-17 MED ORDER — HYDROCODONE-ACETAMINOPHEN 5-325 MG PO TABS
1.0000 | ORAL_TABLET | ORAL | Status: DC | PRN
  Administered 2018-07-17: 2 via ORAL
  Filled 2018-07-17: qty 2

## 2018-07-17 MED ORDER — IOHEXOL 350 MG/ML SOLN: INTRAVENOUS | Status: DC | PRN

## 2018-07-17 MED ORDER — LIDOCAINE HCL 1 % IJ SOLN
INTRAMUSCULAR | Status: AC
  Filled 2018-07-17: qty 20

## 2018-07-17 MED ORDER — GUAIFENESIN-DM 100-10 MG/5ML PO SYRP
5.0000 mL | ORAL_SOLUTION | ORAL | Status: DC | PRN
  Administered 2018-07-17 – 2018-07-18 (×2): 5 mL via ORAL
  Filled 2018-07-17 (×2): qty 5

## 2018-07-17 MED ORDER — SACUBITRIL-VALSARTAN 24-26 MG PO TABS
1.0000 | ORAL_TABLET | Freq: Two times a day (BID) | ORAL | Status: DC
  Administered 2018-07-17 (×2): 1 via ORAL
  Filled 2018-07-17 (×3): qty 1

## 2018-07-17 MED ORDER — MIDAZOLAM HCL 5 MG/5ML IJ SOLN
INTRAMUSCULAR | Status: AC
  Filled 2018-07-17: qty 5

## 2018-07-17 MED ORDER — SODIUM CHLORIDE 0.9% FLUSH: 3.0000 mL | INTRAVENOUS | Status: DC | PRN

## 2018-07-17 MED ORDER — FUROSEMIDE 20 MG PO TABS
20.0000 mg | ORAL_TABLET | Freq: Two times a day (BID) | ORAL | Status: DC
  Filled 2018-07-17: qty 1

## 2018-07-17 MED ORDER — GLIMEPIRIDE 2 MG PO TABS
2.0000 mg | ORAL_TABLET | Freq: Two times a day (BID) | ORAL | Status: DC
  Administered 2018-07-17 (×2): 2 mg via ORAL
  Filled 2018-07-17 (×3): qty 1

## 2018-07-17 MED ORDER — IOPAMIDOL (ISOVUE-370) INJECTION 76%
INTRAVENOUS | Status: AC
  Filled 2018-07-17: qty 50

## 2018-07-17 MED ORDER — MIDAZOLAM HCL 5 MG/5ML IJ SOLN
INTRAMUSCULAR | Status: DC | PRN
  Administered 2018-07-17: 1 mg via INTRAVENOUS

## 2018-07-17 MED ORDER — SODIUM CHLORIDE 0.9 % IV SOLN
INTRAVENOUS | Status: DC
  Administered 2018-07-17: 12:00:00 via INTRAVENOUS

## 2018-07-17 MED ORDER — FENTANYL CITRATE (PF) 100 MCG/2ML IJ SOLN
INTRAMUSCULAR | Status: DC | PRN
  Administered 2018-07-17: 12.5 ug via INTRAVENOUS

## 2018-07-17 MED ORDER — AMLODIPINE BESYLATE 5 MG PO TABS
5.0000 mg | ORAL_TABLET | Freq: Every day | ORAL | Status: DC
  Filled 2018-07-17: qty 1

## 2018-07-17 MED ORDER — INSULIN GLARGINE 100 UNIT/ML ~~LOC~~ SOLN
20.0000 [IU] | Freq: Every day | SUBCUTANEOUS | Status: DC
  Administered 2018-07-17: 20 [IU] via SUBCUTANEOUS
  Filled 2018-07-17: qty 0.2

## 2018-07-17 MED ORDER — CHLORHEXIDINE GLUCONATE 4 % EX LIQD
60.0000 mL | Freq: Once | CUTANEOUS | Status: DC
  Filled 2018-07-17: qty 60

## 2018-07-17 MED ORDER — SODIUM CHLORIDE 0.9 % IV SOLN: 250.0000 mL | INTRAVENOUS | Status: DC | PRN

## 2018-07-17 MED ORDER — CEFAZOLIN SODIUM-DEXTROSE 1-4 GM/50ML-% IV SOLN
1.0000 g | Freq: Four times a day (QID) | INTRAVENOUS | Status: AC
  Administered 2018-07-17 – 2018-07-18 (×3): 1 g via INTRAVENOUS
  Filled 2018-07-17 (×3): qty 50

## 2018-07-17 MED ORDER — CEFAZOLIN SODIUM-DEXTROSE 2-4 GM/100ML-% IV SOLN
INTRAVENOUS | Status: AC
  Filled 2018-07-17: qty 100

## 2018-07-17 MED ORDER — MUPIROCIN 2 % EX OINT
TOPICAL_OINTMENT | CUTANEOUS | Status: AC
  Administered 2018-07-17: 1
  Filled 2018-07-17: qty 22

## 2018-07-17 MED ORDER — SODIUM CHLORIDE 0.9% FLUSH: 3.0000 mL | Freq: Two times a day (BID) | INTRAVENOUS | Status: DC

## 2018-07-17 MED ORDER — CLOPIDOGREL BISULFATE 75 MG PO TABS
75.0000 mg | ORAL_TABLET | Freq: Every day | ORAL | Status: DC
  Filled 2018-07-17: qty 1

## 2018-07-17 MED ORDER — YOU HAVE A PACEMAKER BOOK
Freq: Once | Status: AC
  Administered 2018-07-17: 23:00:00
  Filled 2018-07-17: qty 1

## 2018-07-17 MED ORDER — FENTANYL CITRATE (PF) 100 MCG/2ML IJ SOLN
INTRAMUSCULAR | Status: AC
  Filled 2018-07-17: qty 2

## 2018-07-17 MED ORDER — LIDOCAINE HCL (PF) 1 % IJ SOLN
INTRAMUSCULAR | Status: DC | PRN
  Administered 2018-07-17: 60 mL

## 2018-07-17 MED ORDER — SODIUM CHLORIDE 0.9 % IV SOLN
INTRAVENOUS | Status: AC
  Filled 2018-07-17: qty 2

## 2018-07-17 MED ORDER — LIDOCAINE HCL 1 % IJ SOLN
INTRAMUSCULAR | Status: AC
  Filled 2018-07-17: qty 80

## 2018-07-17 MED ORDER — ACETAMINOPHEN 325 MG PO TABS: 325.0000 mg | ORAL_TABLET | ORAL | Status: DC | PRN

## 2018-07-17 SURGICAL SUPPLY — 17 items
ADAPTER SEALING SSA-EW-09 (MISCELLANEOUS) ×3 IMPLANT
ASSURA CRTD CD3369-40Q (ICD Generator) ×3 IMPLANT
CABLE SURGICAL S-101-97-12 (CABLE) ×3 IMPLANT
CATH ATTAIN COM SURV 6250V-MB2 (CATHETERS) ×3 IMPLANT
CATH HEXAPOLAR DAMATO 6F (CATHETERS) ×3 IMPLANT
DEFIB ASSURA MULTI-CHMBR CRT-D (ICD Generator) ×1 IMPLANT
LEAD DURATA 7122Q-65CM (Lead) ×3 IMPLANT
LEAD QUARTET 1458Q-86CM (Lead) ×3 IMPLANT
LEAD TENDRIL MRI 52CM LPA1200M (Lead) ×3 IMPLANT
PAD PRO RADIOLUCENT 2001M-C (PAD) ×3 IMPLANT
SHEATH CLASSIC 7F (SHEATH) ×3 IMPLANT
SHEATH CLASSIC 8F (SHEATH) ×3 IMPLANT
SHEATH CLASSIC 9.5F (SHEATH) ×3 IMPLANT
SLITTER 6232ADJ (MISCELLANEOUS) ×3 IMPLANT
TRAY PACEMAKER INSERTION (PACKS) ×3 IMPLANT
WIRE ACUITY WHISPER EDS 4648 (WIRE) ×3 IMPLANT
WIRE HI TORQ VERSACORE-J 145CM (WIRE) ×3 IMPLANT

## 2018-07-17 NOTE — Interval H&P Note (Signed)
History and Physical Interval Note:  07/17/2018 12:10 PM  Allen [name]  has presented today for surgery, with the diagnosis of cardiomyopathy  The various methods of treatment have been discussed with the patient and family. After consideration of risks, benefits and other options for treatment, the patient has consented to  Procedure(s): BIV ICD INSERTION CRT-D (N/A) as a surgical intervention .  The patient's history has been reviewed, patient examined, no change in status, stable for surgery.  I have reviewed the patient's chart and labs.  Questions were answered to the patient's satisfaction.    ICD Criteria  Current LVEF: <20%. Within 12 months prior to implant: Yes   Heart failure history: Yes, Class III  Cardiomyopathy history: Yes, Ischemic Cardiomyopathy - Prior MI.  Atrial Fibrillation/Atrial Flutter: No.  Ventricular tachycardia history: No.  Cardiac arrest history: No.  History of syndromes with risk of sudden death: No.  Previous ICD: No.  Current ICD indication: Primary  PPM indication: Yes. Pacing type: Both. Greater than 40% RV pacing requirement anticipated. Indication: Sick Sinus Syndrome  LBBB, CRT  Class I or II Bradycardia indication present: Yes  Beta Blocker therapy for 3 or more months: No, medical reason.  Unable to take due to sinus bradycardia currently  Ace Inhibitor/ARB therapy for 3 or more months: Yes, prescribed.    I have seen Allen PuttSamuel J [name] is a 85 y.o. male .  He presents for BiV ICD implant for primary prevention of sudden death.  The patient's chart has been reviewed and they meet criteria for ICD implant.  I have had a thorough discussion with the patient reviewing options.  The patient and their family have had opportunities to ask questions and have them answered. The patient and I have decided together through the [facility] Share Decision Support Tool to proceed with BiV ICD at this time.  Risks, benefits, alternatives to BiV ICD  implantation were discussed in detail with the patient today. The patient  understands that the risks include but are not limited to bleeding, infection, pneumothorax, perforation, tamponade, vascular damage, renal failure, MI, stroke, death, inappropriate shocks, and lead dislodgement and  wishes to proceed.    Hillis RangeJames Jahvier Aldea MD, [facility] HospitalFACC [facility] 07/17/2018 12:12 PM

## 2018-07-17 NOTE — Progress Notes (Signed)
Orthopedic Tech Progress Note Patient Details:  Allen [name] 10/26/1933 [id number] RN who answered phone said he believes the patient has on a sling  Patient ID: [name], male   DOB: 1934/01/01, 85 y.o.   MRN: [id number]   Donald Pore 07/17/2018, 3:55 PM

## 2018-07-18 ENCOUNTER — Ambulatory Visit (HOSPITAL_COMMUNITY): Payer: Medicare PPO

## 2018-07-18 ENCOUNTER — Encounter (HOSPITAL_COMMUNITY): Payer: Self-pay | Admitting: Internal Medicine

## 2018-07-18 DIAGNOSIS — E119 Type 2 diabetes mellitus without complications: Secondary | ICD-10-CM | POA: Diagnosis not present

## 2018-07-18 DIAGNOSIS — Z006 Encounter for examination for normal comparison and control in clinical research program: Secondary | ICD-10-CM | POA: Diagnosis not present

## 2018-07-18 DIAGNOSIS — I110 Hypertensive heart disease with heart failure: Secondary | ICD-10-CM | POA: Diagnosis not present

## 2018-07-18 DIAGNOSIS — I255 Ischemic cardiomyopathy: Secondary | ICD-10-CM | POA: Diagnosis not present

## 2018-07-18 DIAGNOSIS — E785 Hyperlipidemia, unspecified: Secondary | ICD-10-CM | POA: Diagnosis not present

## 2018-07-18 DIAGNOSIS — I447 Left bundle-branch block, unspecified: Secondary | ICD-10-CM | POA: Diagnosis not present

## 2018-07-18 DIAGNOSIS — I509 Heart failure, unspecified: Secondary | ICD-10-CM | POA: Diagnosis not present

## 2018-07-18 DIAGNOSIS — I2510 Atherosclerotic heart disease of native coronary artery without angina pectoris: Secondary | ICD-10-CM | POA: Diagnosis not present

## 2018-07-18 DIAGNOSIS — Z451 Encounter for adjustment and management of infusion pump: Secondary | ICD-10-CM | POA: Diagnosis not present

## 2018-07-18 DIAGNOSIS — I495 Sick sinus syndrome: Secondary | ICD-10-CM | POA: Diagnosis not present

## 2018-07-18 LAB — BASIC METABOLIC PANEL
Anion gap: 8 (ref 5–15)
BUN: 18 mg/dL (ref 8–23)
CO2: 25 mmol/L (ref 22–32)
CREATININE: 1.34 mg/dL — AB (ref 0.61–1.24)
Calcium: 9.7 mg/dL (ref 8.9–10.3)
Chloride: 108 mmol/L (ref 98–111)
GFR calc non Af Amer: 49 mL/min — ABNORMAL LOW (ref >60)
GFR, EST AFRICAN AMERICAN: 57 mL/min — AB (ref >60)
Glucose, Bld: 126 mg/dL — ABNORMAL HIGH (ref 70–99)
Potassium: 4.3 mmol/L (ref 3.5–5.1)
SODIUM: 141 mmol/L (ref 135–145)

## 2018-07-18 LAB — GLUCOSE, CAPILLARY: Glucose-Capillary: 125 mg/dL — ABNORMAL HIGH (ref 70–99)

## 2018-07-18 MED ORDER — CARVEDILOL 3.125 MG PO TABS: 3.1250 mg | ORAL_TABLET | Freq: Two times a day (BID) | ORAL | 6 refills | Status: DC

## 2018-07-18 MED FILL — Lidocaine HCl Local Inj 1%: INTRAMUSCULAR | Qty: 80 | Status: AC

## 2018-07-18 NOTE — Discharge Summary (Addendum)
ELECTROPHYSIOLOGY PROCEDURE DISCHARGE SUMMARY    Patient ID: Allen [name],  MRN: [id number], DOB/AGE: [birth year] 85 y.o.  Admit date: 07/17/2018 Discharge date: 07/18/2018  Primary Care Physician: [name], MD Primary Cardiologist: Dr. Diona Browner Electrophysiologist: DR. Johney Frame  Primary Discharge Diagnosis:  1. ICM 2. LBBB 3. Bradycardia  Secondary Discharge Diagnosis:  1. CAD 2. HTN 3. DM  No Known Allergies   Procedures This Admission:  1.  Implantation of a SJM CRT-D on 07/17/2018 by Dr Johney Frame.   The patient received a Public house [employer] Tendril MRI model LPA1200M-  52 (serial number  [id number]) right atrial lead and a [employer] [place], model 3295-18 (serial number [id number]) right ventricular defibrillator lead, [employer] Quartet model [phone] [phone] (serial number [id number]) lead (LV), [employer] Quadra Assura MP model [phone] (serial  Number [id number]) biventricular ICD DFT's were deferred at time of implant.   There were no immediate post procedure complications. 2.  CXR on 07/18/2018 demonstrated no pneumothorax status post device implantation.   Brief HPI: Allen [name] is a 85 y.o. male was referred to electrophysiology in the outpatient setting for consideration of ICD implantation.  Past medical history includes above.  The patient has persistent LV dysfunction despite guideline directed therapy.  Risks, benefits, and alternatives to ICD implantation were reviewed with the patient who wished to proceed.   Hospital Course:  The patient was admitted and underwent implantation of a CRT-D with details as outlined above. He was monitored on telemetry overnight which demonstrated AV pacing.  Left chest was without hematoma or ecchymosis.  The device was interrogated and found to be functioning normally.  CXR was obtained and demonstrated no pneumothorax status post device implantation.  Wound care, arm mobility, and restrictions were reviewed with the  patient.  The patient feels well, no CP or SOB, he was examined by Dr. Johney Frame and considered stable for discharge to home.   We will get his started on carvedilol, now s/p ICD  The patient's discharge medications include an ARB (Entresto) and beta blocker (unable with bradycardia).   Physical Exam: Vitals:   07/17/18 1858 07/17/18 2035 07/18/18 0505 07/18/18 0824  BP: 132/74 127/69 139/79 125/81  Pulse: 63 68 65 69  Resp: 16 (!) 9 18 15   Temp:  98.2 F (36.8 C) 98.3 F (36.8 C) 97.7 F (36.5 C)  TempSrc:  Oral Oral Oral  SpO2: 96% 97% 99% 97%  Weight:   79.6 kg   Height:        GEN- The patient is well appearing, alert and oriented x 3 today.   HEENT: normocephalic, atraumatic; sclera clear, conjunctiva pink; hearing intact; oropharynx clear Lungs- CTA b/l, normal work of breathing.  No wheezes, rales, rhonchi Heart- RRR, no murmurs, rubs or gallops, PMI not laterally displaced GI- soft, non-tender, non-distended Extremities- no clubbing, cyanosis, or edema MS- no significant deformity or atrophy Skin- warm and dry, no rash or lesion, left chest without hematoma/ecchymosis Psych- euthymic mood, full affect Neuro- no gross defecits  Labs:   Lab Results  Component Value Date   WBC 7.0 06/28/2018   HGB 14.8 06/28/2018   HCT 45.1 06/28/2018   MCV 85 06/28/2018   PLT 268 06/28/2018    Recent Labs  Lab 07/18/18 0432  NA 141  K 4.3  CL 108  CO2 25  BUN 18  CREATININE 1.34*  CALCIUM 9.7  GLUCOSE 126*    Discharge Medications:  Allergies as of  07/18/2018   No Known Allergies     Medication List    TAKE these medications   acetaminophen 325 MG tablet Commonly known as:  TYLENOL Take 650 mg by mouth every 6 (six) hours as needed (pain.).   amLODipine 5 MG tablet Commonly known as:  NORVASC Take 1 tablet (5 mg total) by mouth daily.   aspirin 81 MG EC tablet Take 81 mg by mouth daily.   atorvastatin 80 MG tablet Commonly known as:  LIPITOR Take 1 tablet  (80 mg total) by mouth daily at 6 PM.   carvedilol 3.125 MG tablet Commonly known as:  COREG Take 1 tablet (3.125 mg total) by mouth 2 (two) times daily.   clopidogrel 75 MG tablet Commonly known as:  PLAVIX TAKE 1 TABLET ONE TIME DAILY WITH BREAKFAST What changed:  See the new instructions.   furosemide 20 MG tablet Commonly known as:  LASIX Take 20 mg by mouth 2 (two) times daily.   glimepiride 2 MG tablet Commonly known as:  AMARYL Take 2 mg by mouth 2 (two) times daily.   Insulin Glargine 100 UNIT/ML Solostar Pen Commonly known as:  LANTUS SOLOSTAR Inject 20 Units into the skin at bedtime.   nitroGLYCERIN 0.4 MG SL tablet Commonly known as:  NITROSTAT Place 1 tablet (0.4 mg total) under the tongue every 5 (five) minutes x 3 doses as needed for chest pain (if no relief after 3rd dose, proceed to the ED for an evaluation).   sacubitril-valsartan 24-26 MG Commonly known as:  ENTRESTO Take 1 tablet by mouth 2 (two) times daily. Start this 48 hours after stopping lisinopril-start taking this on Friday 04/05/18       Disposition:  Home  Discharge Instructions    Diet - low sodium heart healthy   Complete by:  As directed    Increase activity slowly   Complete by:  As directed      Follow-up Information    [facility] Office Follow up.   Specialty:  Cardiology Why:  07/30/2018 @ 1:45PM, wound check visit Contact information: [address] [place] [address] [phone]       Hillis Range, MD Follow up.   Specialty:  Cardiology Why:  10/21/2018 @ 2:45PM Contact information: [address] [place] [address] [phone]           Duration of Discharge Encounter: Greater than 30 minutes including physician time.  Norma Fredrickson, PA-C 07/18/2018 9:54 AM  Device interrogation is personally reviewed and normal CXR reveals stable leads, no ptx  DC to home with routine wound care and  follow-up  Hillis Range MD, [facility] 07/18/2018 12:40 PM

## 2018-07-18 NOTE — Discharge Instructions (Signed)
° ° °  Supplemental Discharge Instructions for  Pacemaker/Defibrillator Patients  Activity No heavy lifting or vigorous activity with your left/right arm for 6 to 8 weeks.  Do not raise your left/right arm above your head for one week.  Gradually raise your affected arm as drawn below.             07/21/2018                   07/22/2018                07/23/2018               07/24/2018 __  NO DRIVING for  1 week   ; you may begin driving on  5/32/0233   .  WOUND CARE - Keep the wound area clean and dry.  Do not get this area wet, no showers until cleared to at your wound check visit - The tape/steri-strips on your wound will fall off; do not pull them off.  No bandage is needed on the site.  DO  NOT apply any creams, oils, or ointments to the wound area. - If you notice any drainage or discharge from the wound, any swelling or bruising at the site, or you develop a fever > 101? F after you are discharged home, call the office at once.  Special Instructions - You are still able to use cellular telephones; use the ear opposite the side where you have your pacemaker/defibrillator.  Avoid carrying your cellular phone near your device. - When traveling through airports, show security personnel your identification card to avoid being screened in the metal detectors.  Ask the security personnel to use the hand wand. - Avoid arc welding equipment, MRI testing (magnetic resonance imaging), TENS units (transcutaneous nerve stimulators).  Call the office for questions about other devices. - Avoid electrical appliances that are in poor condition or are not properly grounded. - Microwave ovens are safe to be near or to operate.  Additional information for defibrillator patients should your device go off: - If your device goes off ONCE and you feel fine afterward, notify the device clinic nurses. - If your device goes off ONCE and you do not feel well afterward, call 911. - If your device goes off TWICE,  call 911. - If your device goes off THREE times in one day, call 911.  DO NOT DRIVE YOURSELF OR A FAMILY MEMBER WITH A DEFIBRILLATOR TO THE HOSPITAL--CALL 911.

## 2018-07-22 ENCOUNTER — Telehealth: Payer: Self-pay | Admitting: Cardiology

## 2018-07-22 DIAGNOSIS — R222 Localized swelling, mass and lump, trunk: Secondary | ICD-10-CM | POA: Diagnosis not present

## 2018-07-22 DIAGNOSIS — I10 Essential (primary) hypertension: Secondary | ICD-10-CM | POA: Diagnosis not present

## 2018-07-22 DIAGNOSIS — E1121 Type 2 diabetes mellitus with diabetic nephropathy: Secondary | ICD-10-CM | POA: Diagnosis not present

## 2018-07-22 DIAGNOSIS — K210 Gastro-esophageal reflux disease with esophagitis: Secondary | ICD-10-CM | POA: Diagnosis not present

## 2018-07-22 DIAGNOSIS — Z95810 Presence of automatic (implantable) cardiac defibrillator: Secondary | ICD-10-CM | POA: Diagnosis not present

## 2018-07-22 DIAGNOSIS — M542 Cervicalgia: Secondary | ICD-10-CM | POA: Diagnosis not present

## 2018-07-22 NOTE — Telephone Encounter (Signed)
Forward to device clinic to address.

## 2018-07-22 NOTE — Telephone Encounter (Signed)
LMOVM requesting call back to the DC. Gave direct number for return call. 

## 2018-07-22 NOTE — Telephone Encounter (Signed)
Had defib placed last Wednesday York Spaniel that it is swollen   It is not hurting but red a little

## 2018-07-23 NOTE — Telephone Encounter (Signed)
Follow up  ° ° °Patient is returning your call. °

## 2018-07-23 NOTE — Telephone Encounter (Signed)
Spoke with pt who stated that the swelling is going down, pt denied any fever, chills or drainage, pt stated that if the swelling doesn't continue to subside then he will call back and make an earlier apt.

## 2018-07-23 NOTE — Telephone Encounter (Signed)
Rerouted to [facility] triage

## 2018-07-23 NOTE — Telephone Encounter (Signed)
LVM for return call; left device clinic direct number.

## 2018-07-30 ENCOUNTER — Ambulatory Visit (INDEPENDENT_AMBULATORY_CARE_PROVIDER_SITE_OTHER): Payer: Medicare PPO | Admitting: Nurse Practitioner

## 2018-07-30 DIAGNOSIS — I5042 Chronic combined systolic (congestive) and diastolic (congestive) heart failure: Secondary | ICD-10-CM

## 2018-07-30 LAB — CUP PACEART INCLINIC DEVICE CHECK
Date Time Interrogation Session: 20200218130548
Implantable Lead Implant Date: 20200205
Implantable Lead Implant Date: 20200205
Implantable Lead Implant Date: 20200205
Implantable Lead Location: 753858
Implantable Lead Location: 753859
Implantable Lead Location: 753860
Implantable Pulse Generator Implant Date: 20200205
Pulse Gen Serial Number: 9880571

## 2018-07-30 NOTE — Progress Notes (Signed)

## 2018-08-23 ENCOUNTER — Telehealth: Payer: Self-pay | Admitting: Cardiology

## 2018-08-23 ENCOUNTER — Ambulatory Visit (INDEPENDENT_AMBULATORY_CARE_PROVIDER_SITE_OTHER): Payer: Medicare PPO | Admitting: Cardiology

## 2018-08-23 ENCOUNTER — Encounter: Payer: Self-pay | Admitting: Cardiology

## 2018-08-23 ENCOUNTER — Other Ambulatory Visit: Payer: Self-pay

## 2018-08-23 ENCOUNTER — Other Ambulatory Visit: Payer: Self-pay | Admitting: *Deleted

## 2018-08-23 VITALS — BP 125/73 | HR 65 | Ht 62.0 in | Wt 181.8 lb

## 2018-08-23 DIAGNOSIS — I5042 Chronic combined systolic (congestive) and diastolic (congestive) heart failure: Secondary | ICD-10-CM

## 2018-08-23 DIAGNOSIS — I255 Ischemic cardiomyopathy: Secondary | ICD-10-CM

## 2018-08-23 DIAGNOSIS — I25709 Atherosclerosis of coronary artery bypass graft(s), unspecified, with unspecified angina pectoris: Secondary | ICD-10-CM

## 2018-08-23 DIAGNOSIS — I10 Essential (primary) hypertension: Secondary | ICD-10-CM

## 2018-08-23 DIAGNOSIS — E782 Mixed hyperlipidemia: Secondary | ICD-10-CM | POA: Diagnosis not present

## 2018-08-23 MED ORDER — SPIRONOLACTONE 25 MG PO TABS: 12.5000 mg | ORAL_TABLET | Freq: Every day | ORAL | 1 refills | Status: DC

## 2018-08-23 NOTE — Patient Instructions (Addendum)
Medication Instructions:   Your physician has recommended you make the following change in your medication:   Stop furosemide twice daily and change to taking one by mouth daily as needed for weight gain of 2-3 lbs in 24 hours or 5 lbs in one week.  Start spironolacatone 12.5 mg by mouth daily  Continue all other medications the same  Labwork:  Your physician recommends that you return for lab work in: one week to check your BMET. You do not have to fast for this lab work and you may have it done at [facility]  Testing/Procedures: Your physician has requested that you have an echocardiogram in 3 months just before your next visit. Echocardiography is a painless test that uses sound waves to create images of your heart. It provides your doctor with information about the size and shape of your heart and how well your heart's chambers and valves are working. This procedure takes approximately one hour. There are no restrictions for this procedure.  Follow-Up:  Your physician recommends that you schedule a follow-up appointment in: 3 months.  Any Other Special Instructions Will Be Listed Below (If Applicable).  If you need a refill on your cardiac medications before your next appointment, please call your pharmacy.

## 2018-08-23 NOTE — Telephone Encounter (Signed)
°  Precert needed for: 2 D Echo dx: cardiomyopathy   Location: [facility]    Date: November 21, 2018

## 2018-08-23 NOTE — Progress Notes (Signed)
Cardiology Office Note  Date: 08/23/2018   ID: Allen [name], DOB 1934/02/03, MRN [id number]  PCP: [name], MD  Primary Cardiologist: [name], MD   Chief Complaint  Patient presents with  . Cardiomyopathy    History of Present Illness: Allen [name] is an 85 y.o. male last seen by Ms. Strader PA-C in January.  He is here for a routine follow-up visit.  Overall doing reasonably well.  He walks on the treadmill 3 days a week at [employer].  Reports NYHA class II dyspnea with typical activities.  No palpitations or syncope.  He had a consultation with Dr. Johney Frame in January he underwent placement of a St. Jude biventricular ICD in February.  He tolerated this well.  I went over his medications which are listed below.  He is now on Coreg following ICD insertion.  I talked with him about initiating Aldactone and using Lasix on an as-needed basis.  Otherwise his regimen includes Entresto and Norvasc.  Past Medical History:  Diagnosis Date  . AICD (automatic cardioverter/defibrillator) present 07/17/2018   BIV  . CAD (coronary artery disease)    a. 04/2016: s/p CABG with LIMA-LAD, SVG-D1, SVG-RCA, and seq SVG-OM1-OM2  . Essential hypertension   . Hyperlipidemia   . Ischemic cardiomyopathy    LVEF 35% May 2016  . LBBB (left bundle branch block)   . Sinus bradycardia   . Type 2 diabetes mellitus (HCC)     Past Surgical History:  Procedure Laterality Date  . BIV ICD INSERTION CRT-D  07/17/2018  . BIV ICD INSERTION CRT-D N/A 07/17/2018   Procedure: BIV ICD INSERTION CRT-D;  Surgeon: Hillis Range, MD;  Location: [facility] INVASIVE CV LAB;  Service: Cardiovascular;  Laterality: N/A;  . CARDIAC CATHETERIZATION N/A 11/02/2014   Procedure: Left Heart Cath and Coronary Angiography;  Surgeon: Lyn Records, MD;  Location: [facility] INVASIVE CV LAB;  Service: Cardiovascular;  Laterality: N/A;  . CARDIAC CATHETERIZATION N/A 05/03/2016   Procedure: Left Heart Cath and Coronary  Angiography;  Surgeon: Peter M Swaziland, MD;  Location: [facility] INVASIVE CV LAB;  Service: Cardiovascular;  Laterality: N/A;  . CORONARY ARTERY BYPASS GRAFT N/A 05/08/2016   Procedure: CORONARY ARTERY BYPASS GRAFTING (CABG) x 5;  Surgeon: Alleen Borne, MD;  Location: [facility] OR;  Service: Open Heart Surgery;  Laterality: N/A;  . ENDOVEIN HARVEST OF GREATER SAPHENOUS VEIN  05/08/2016   Procedure: ENDOVEIN HARVEST OF GREATER SAPHENOUS VEIN;  Surgeon: Alleen Borne, MD;  Location: [facility] OR;  Service: Open Heart Surgery;;  . TEE WITHOUT CARDIOVERSION N/A 05/08/2016   Procedure: TRANSESOPHAGEAL ECHOCARDIOGRAM (TEE);  Surgeon: Alleen Borne, MD;  Location: [facility] OR;  Service: Open Heart Surgery;  Laterality: N/A;    Current Outpatient Medications  Medication Sig Dispense Refill  . acetaminophen (TYLENOL) 325 MG tablet Take 650 mg by mouth every 6 (six) hours as needed (pain.).    Marland Kitchen amLODipine (NORVASC) 5 MG tablet Take 1 tablet (5 mg total) by mouth daily. 90 tablet 3  . aspirin 81 MG EC tablet Take 81 mg by mouth daily.    Marland Kitchen atorvastatin (LIPITOR) 80 MG tablet Take 1 tablet (80 mg total) by mouth daily at 6 PM. 90 tablet 3  . carvedilol (COREG) 3.125 MG tablet Take 1 tablet (3.125 mg total) by mouth 2 (two) times daily. 60 tablet 6  . clopidogrel (PLAVIX) 75 MG tablet TAKE 1 TABLET ONE TIME DAILY WITH BREAKFAST (Patient taking differently: Take 75 mg  by mouth daily with breakfast. ) 90 tablet 3  . furosemide (LASIX) 20 MG tablet Take 20 mg by mouth daily as needed. Weight gain of 2-3 lbs in a 24 hour period or 5 lbs in one week    . glimepiride (AMARYL) 2 MG tablet Take 2 mg by mouth 2 (two) times daily.    . Insulin Glargine (LANTUS SOLOSTAR) 100 UNIT/ML Solostar Pen Inject 20 Units into the skin at bedtime. 15 mL 11  . nitroGLYCERIN (NITROSTAT) 0.4 MG SL tablet Place 1 tablet (0.4 mg total) under the tongue every 5 (five) minutes x 3 doses as needed for chest pain (if no relief after 3rd dose, proceed to the ED  for an evaluation). 75 tablet 1  . sacubitril-valsartan (ENTRESTO) 24-26 MG Take 1 tablet by mouth 2 (two) times daily. Start this 48 hours after stopping lisinopril-start taking this on Friday 04/05/18 60 tablet 6  . spironolactone (ALDACTONE) 25 MG tablet Take 0.5 tablets (12.5 mg total) by mouth daily. 45 tablet 1   No current facility-administered medications for this visit.    Allergies:  Patient has no known allergies.   Social History: The patient  reports that he quit smoking about 7 years ago. His smoking use included cigarettes. He started smoking about 62 years ago. He has a 28.00 pack-year smoking history. He has never used smokeless tobacco. He reports that he does not drink alcohol or use drugs.   ROS:  Please see the history of present illness. Otherwise, complete review of systems is positive for hearing loss.  All other systems are reviewed and negative.   Physical Exam: VS:  BP 125/73   Pulse 65   Ht  (1.575 m)   Wt 181 lb 12.8 oz (82.5 kg)   SpO2 97%   BMI 33.25 kg/m , BMI Body mass index is 33.25 kg/m.  Wt Readings from Last 3 Encounters:  08/23/18 181 lb 12.8 oz (82.5 kg)  07/18/18 175 lb 7.8 oz (79.6 kg)  06/28/18 179 lb 9.6 oz (81.5 kg)    General: Elderly male, appears comfortable at rest. HEENT: Conjunctiva and lids normal, oropharynx clear. Neck: Supple, no elevated JVP or carotid bruits, no thyromegaly. Lungs: Decreased breath sounds, nonlabored breathing at rest. Cardiac: Regular rate and rhythm, no S3, 2/6 systolic murmur. Abdomen: Soft, nontender, bowel sounds present. Extremities: No pitting edema, distal pulses 2+. Skin: Warm and dry. Musculoskeletal: No kyphosis. Neuropsychiatric: Alert and oriented x3, affect grossly appropriate.  ECG: I personally reviewed the tracing from 07/18/2018 which showed a dual-chamber paced rhythm.  Recent Labwork: 06/28/2018: Hemoglobin 14.8; Platelets 268 07/18/2018: BUN 18; Creatinine, Ser 1.34; Potassium 4.3;  Sodium 141     Component Value Date/Time   CHOL 170 11/02/2014 0213   TRIG 121 11/02/2014 0213   HDL 45 11/02/2014 0213   CHOLHDL 3.8 11/02/2014 0213   VLDL 24 11/02/2014 0213   LDLCALC 101 (H) 11/02/2014 0213    Other Studies Reviewed Today:  Echocardiogram 03/07/2018 [facility]): Mild to moderate LVH with LVEF less than 20%, grade 1 diastolic dysfunction, mild to moderate left atrial enlargement, normal right ventricular contraction, mild aortic stenosis, mild mitral regurgitation.  Assessment and Plan:  1.  Secondary cardiomyopathy with LVEF approximately 20%.  He has undergone medical therapy optimization and with left bundle branch block (QRS duration greater than 150 ms) is now status post placement of St. Jude biventricular ICD by Dr. Johney Frame.  He is clinically stable at this time.  Now on Coreg which can be uptitrated as tolerated to enhance biventricular pacing.  Plan to start Aldactone 12.5 mg daily and use Lasix only as needed.  Check BMET in 1 week.  He will have follow-up in 3 months with an echocardiogram.  2.  Multivessel CAD status post CABG in November 2017.  No active angina symptoms.  He is on aspirin, Plavix, and statin.  3.  Mixed hyperlipidemia, continues on Lipitor.  4.  Essential hypertension, blood pressure control is good today.   Current medicines were reviewed with the patient today.   Orders Placed This Encounter  Procedures  . Basic metabolic panel  . ECHOCARDIOGRAM COMPLETE    Disposition: Follow-up in 3 months.  Signed, Jonelle Sidle, MD, [facility] 08/23/2018 11:59 AM    [facility] at [facility] [address] [place], [place], [place] [address] Phone: [phone]; Fax: [phone]

## 2018-08-26 ENCOUNTER — Telehealth: Payer: Self-pay | Admitting: Cardiology

## 2018-08-26 NOTE — Telephone Encounter (Signed)
Patient walked into office stating that the pharmacy told him that taking Spironolacatone with not work while he is taking Chief Financial Officer .

## 2018-08-26 NOTE — Telephone Encounter (Signed)
Patient informed that it is okay to take both medications together.

## 2018-09-02 DIAGNOSIS — E1121 Type 2 diabetes mellitus with diabetic nephropathy: Secondary | ICD-10-CM | POA: Diagnosis not present

## 2018-09-02 DIAGNOSIS — K210 Gastro-esophageal reflux disease with esophagitis: Secondary | ICD-10-CM | POA: Diagnosis not present

## 2018-09-02 DIAGNOSIS — M542 Cervicalgia: Secondary | ICD-10-CM | POA: Diagnosis not present

## 2018-09-02 DIAGNOSIS — I5042 Chronic combined systolic (congestive) and diastolic (congestive) heart failure: Secondary | ICD-10-CM | POA: Diagnosis not present

## 2018-09-02 DIAGNOSIS — I10 Essential (primary) hypertension: Secondary | ICD-10-CM | POA: Diagnosis not present

## 2018-09-03 ENCOUNTER — Telehealth: Payer: Self-pay | Admitting: *Deleted

## 2018-09-03 NOTE — Telephone Encounter (Signed)
-----   Message from Jonelle Sidle, MD sent at 09/02/2018  1:33 PM EDT ----- Results reviewed.  Renal function and potassium are stable after recent medication adjustments, continue same for now. Follow-up already arranged. A copy of this test should be forwarded to [name], MD.

## 2018-09-04 NOTE — Telephone Encounter (Signed)
Patient informed. Copy sent to PCP °

## 2018-09-23 DIAGNOSIS — I5022 Chronic systolic (congestive) heart failure: Secondary | ICD-10-CM | POA: Diagnosis not present

## 2018-09-23 DIAGNOSIS — J449 Chronic obstructive pulmonary disease, unspecified: Secondary | ICD-10-CM | POA: Diagnosis not present

## 2018-09-23 DIAGNOSIS — M25561 Pain in right knee: Secondary | ICD-10-CM | POA: Diagnosis not present

## 2018-09-23 DIAGNOSIS — E119 Type 2 diabetes mellitus without complications: Secondary | ICD-10-CM | POA: Diagnosis not present

## 2018-09-23 DIAGNOSIS — Z6829 Body mass index (BMI) 29.0-29.9, adult: Secondary | ICD-10-CM | POA: Diagnosis not present

## 2018-10-03 DIAGNOSIS — M25561 Pain in right knee: Secondary | ICD-10-CM | POA: Diagnosis not present

## 2018-10-03 DIAGNOSIS — J440 Chronic obstructive pulmonary disease with acute lower respiratory infection: Secondary | ICD-10-CM | POA: Diagnosis not present

## 2018-10-03 DIAGNOSIS — Z6829 Body mass index (BMI) 29.0-29.9, adult: Secondary | ICD-10-CM | POA: Diagnosis not present

## 2018-10-07 ENCOUNTER — Telehealth: Payer: Self-pay | Admitting: Cardiology

## 2018-10-07 DIAGNOSIS — E1121 Type 2 diabetes mellitus with diabetic nephropathy: Secondary | ICD-10-CM | POA: Diagnosis not present

## 2018-10-07 DIAGNOSIS — I10 Essential (primary) hypertension: Secondary | ICD-10-CM | POA: Diagnosis not present

## 2018-10-07 DIAGNOSIS — K210 Gastro-esophageal reflux disease with esophagitis: Secondary | ICD-10-CM | POA: Diagnosis not present

## 2018-10-07 MED ORDER — AMLODIPINE BESYLATE 5 MG PO TABS: 5.0000 mg | ORAL_TABLET | Freq: Every day | ORAL | 3 refills | Status: DC

## 2018-10-07 MED ORDER — CARVEDILOL 3.125 MG PO TABS: 3.1250 mg | ORAL_TABLET | Freq: Two times a day (BID) | ORAL | 3 refills | Status: DC

## 2018-10-07 NOTE — Telephone Encounter (Signed)
amLODipine (NORVASC) 5 MG tablet   carvedilol (COREG) 3.125 MG tablet     Has questions about above medications.

## 2018-10-07 NOTE — Telephone Encounter (Signed)
Daughter called to verify dose of amlodipine and carvedilol. Advised that amlodipine is 5 mg daily and carvedilol is 3.125 mg twice daily. Daughter stated that PCP's office sent amlodipine 10 mg to pharmacy and PCP office did not have carvedilol listed. Daughter confirmed that patient was taking amlodipine 5 mg and carvedilol 3.125 mg twice daily. Aware to break 10 mg amlodipine in half daily.Verbalized understanding.

## 2018-10-15 ENCOUNTER — Ambulatory Visit (INDEPENDENT_AMBULATORY_CARE_PROVIDER_SITE_OTHER): Payer: Medicare PPO | Admitting: *Deleted

## 2018-10-15 ENCOUNTER — Other Ambulatory Visit: Payer: Self-pay

## 2018-10-15 DIAGNOSIS — I255 Ischemic cardiomyopathy: Secondary | ICD-10-CM | POA: Diagnosis not present

## 2018-10-15 LAB — CUP PACEART REMOTE DEVICE CHECK
Date Time Interrogation Session: 20200505093504
Implantable Lead Implant Date: 20200205
Implantable Lead Implant Date: 20200205
Implantable Lead Implant Date: 20200205
Implantable Lead Location: 753858
Implantable Lead Location: 753859
Implantable Lead Location: 753860
Implantable Pulse Generator Implant Date: 20200205
Pulse Gen Serial Number: 9880571

## 2018-10-21 ENCOUNTER — Encounter: Payer: Medicare PPO | Admitting: Internal Medicine

## 2018-10-22 ENCOUNTER — Encounter: Payer: Self-pay | Admitting: Cardiology

## 2018-10-22 NOTE — Progress Notes (Signed)
Remote ICD transmission.   

## 2018-11-08 DIAGNOSIS — E1121 Type 2 diabetes mellitus with diabetic nephropathy: Secondary | ICD-10-CM | POA: Diagnosis not present

## 2018-11-08 DIAGNOSIS — I10 Essential (primary) hypertension: Secondary | ICD-10-CM | POA: Diagnosis not present

## 2018-11-08 DIAGNOSIS — K210 Gastro-esophageal reflux disease with esophagitis: Secondary | ICD-10-CM | POA: Diagnosis not present

## 2018-11-15 ENCOUNTER — Telehealth: Payer: Self-pay

## 2018-11-20 ENCOUNTER — Ambulatory Visit (INDEPENDENT_AMBULATORY_CARE_PROVIDER_SITE_OTHER): Payer: Medicare PPO

## 2018-11-20 ENCOUNTER — Telehealth: Payer: Self-pay | Admitting: *Deleted

## 2018-11-20 DIAGNOSIS — I255 Ischemic cardiomyopathy: Secondary | ICD-10-CM | POA: Diagnosis not present

## 2018-11-20 NOTE — Telephone Encounter (Signed)
-----   Message from Izea G McDowell, MD sent at 11/20/2018 10:36 AM EDT ----- Results reviewed.  LVEF has improved somewhat compared to prior assessment, now 30 to 35%.  Continue with current therapy and we will review his medications at follow-up. 

## 2018-11-21 ENCOUNTER — Telehealth: Payer: Self-pay | Admitting: *Deleted

## 2018-11-21 ENCOUNTER — Other Ambulatory Visit: Payer: Medicare PPO

## 2018-11-21 NOTE — Telephone Encounter (Signed)
-----   Message from Satira Sark, MD sent at 11/20/2018 10:36 AM EDT ----- Results reviewed.  LVEF has improved somewhat compared to prior assessment, now 30 to 35%.  Continue with current therapy and we will review his medications at follow-up.

## 2018-11-21 NOTE — Telephone Encounter (Signed)
Patient informed. Copy sent to PCP °

## 2018-11-21 NOTE — Telephone Encounter (Signed)
Medications and allergies reviewed with patient  Does not have vitals available and is not at home-will have vitals available in the morning  Patient verbally consented for telehealth visits with [facility] and understands that his insurance company will be billed for the encounter.

## 2018-11-22 ENCOUNTER — Telehealth (INDEPENDENT_AMBULATORY_CARE_PROVIDER_SITE_OTHER): Payer: Medicare PPO | Admitting: Internal Medicine

## 2018-11-22 DIAGNOSIS — I447 Left bundle-branch block, unspecified: Secondary | ICD-10-CM

## 2018-11-22 DIAGNOSIS — I10 Essential (primary) hypertension: Secondary | ICD-10-CM

## 2018-11-22 DIAGNOSIS — I255 Ischemic cardiomyopathy: Secondary | ICD-10-CM | POA: Diagnosis not present

## 2018-11-22 DIAGNOSIS — I5042 Chronic combined systolic (congestive) and diastolic (congestive) heart failure: Secondary | ICD-10-CM

## 2018-11-22 DIAGNOSIS — I25709 Atherosclerosis of coronary artery bypass graft(s), unspecified, with unspecified angina pectoris: Secondary | ICD-10-CM

## 2018-11-22 NOTE — Progress Notes (Signed)
Electrophysiology TeleHealth Note   Due to national recommendations of social distancing due to [place] 19, an audio telehealth visit is felt to be most appropriate for this patient at this time.  Verbal consent was obtained by me for the telehealth visit today.  The patient does not have capability for a virtual visit.  A phone visit is therefore required today.   Date:  11/22/2018   ID:  Allen [name], DOB 26-Jun-1933, MRN [id number]  Location: patient's home  Provider location:  [facility]  Evaluation Performed: Follow-up visit  PCP:  Neale Burly, MD   Electrophysiologist:  Dr Rayann Heman  Chief Complaint:  ICD follow up  History of Present Illness:    Allen [name] is a 85 y.o. male who presents via telehealth conferencing today.  Since last being seen in our clinic, the patient reports doing reasonably well. Shortness of breath is somewhat improved post CRT implant.  Today, he denies symptoms of palpitations, chest pain, shortness of breath (above baseline),  lower extremity edema, dizziness, presyncope, or syncope.  The patient is otherwise without complaint today.  The patient denies symptoms of fevers, chills, cough, or new SOB worrisome for COVID 19.  Past Medical History:  Diagnosis Date  . AICD (automatic cardioverter/defibrillator) present 07/17/2018   BIV  . CAD (coronary artery disease)    a. 04/2016: s/p CABG with LIMA-LAD, SVG-D1, SVG-RCA, and seq SVG-OM1-OM2  . Essential hypertension   . Hyperlipidemia   . Ischemic cardiomyopathy    LVEF 35% May 2016  . LBBB (left bundle branch block)   . Sinus bradycardia   . Type 2 diabetes mellitus ([place])     Past Surgical History:  Procedure Laterality Date  . BIV ICD INSERTION CRT-D  07/17/2018  . BIV ICD INSERTION CRT-D N/A 07/17/2018   Procedure: BIV ICD INSERTION CRT-D;  Surgeon: Thompson Grayer, MD;  Location: [place] CV LAB;  Service: Cardiovascular;  Laterality: N/A;  . CARDIAC CATHETERIZATION N/A 11/02/2014    Procedure: Left Heart Cath and Coronary Angiography;  Surgeon: Belva Crome, MD;  Location: [place] CV LAB;  Service: Cardiovascular;  Laterality: N/A;  . CARDIAC CATHETERIZATION N/A 05/03/2016   Procedure: Left Heart Cath and Coronary Angiography;  Surgeon: Peter M Martinique, MD;  Location: [place] CV LAB;  Service: Cardiovascular;  Laterality: N/A;  . CORONARY ARTERY BYPASS GRAFT N/A 05/08/2016   Procedure: CORONARY ARTERY BYPASS GRAFTING (CABG) x 5;  Surgeon: Gaye Pollack, MD;  Location: [place] OR;  Service: Open Heart Surgery;  Laterality: N/A;  . ENDOVEIN HARVEST OF GREATER SAPHENOUS VEIN  05/08/2016   Procedure: ENDOVEIN HARVEST OF GREATER SAPHENOUS VEIN;  Surgeon: Gaye Pollack, MD;  Location: [place] OR;  Service: Open Heart Surgery;;  . TEE WITHOUT CARDIOVERSION N/A 05/08/2016   Procedure: TRANSESOPHAGEAL ECHOCARDIOGRAM (TEE);  Surgeon: Gaye Pollack, MD;  Location: [place];  Service: Open Heart Surgery;  Laterality: N/A;    Current Outpatient Medications  Medication Sig Dispense Refill  . acetaminophen (TYLENOL) 325 MG tablet Take 650 mg by mouth every 6 (six) hours as needed (pain.).    Marland Kitchen amLODipine (NORVASC) 5 MG tablet Take 1 tablet (5 mg total) by mouth daily. 90 tablet 3  . aspirin 81 MG EC tablet Take 81 mg by mouth daily.    Marland Kitchen atorvastatin (LIPITOR) 80 MG tablet Take 1 tablet (80 mg total) by mouth daily at 6 PM. 90 tablet 3  . carvedilol (COREG) 3.125 MG tablet Take  1 tablet (3.125 mg total) by mouth 2 (two) times daily. 180 tablet 3  . clopidogrel (PLAVIX) 75 MG tablet TAKE 1 TABLET ONE TIME DAILY WITH BREAKFAST (Patient taking differently: Take 75 mg by mouth daily with breakfast. ) 90 tablet 3  . furosemide (LASIX) 20 MG tablet Take 20 mg by mouth daily as needed. Weight gain of 2-3 lbs in a 24 hour period or 5 lbs in one week    . glimepiride (AMARYL) 2 MG tablet Take 2 mg by mouth 2 (two) times daily.    . Insulin Glargine (LANTUS SOLOSTAR) 100 UNIT/ML Solostar Pen  Inject 20 Units into the skin at bedtime. 15 mL 11  . nitroGLYCERIN (NITROSTAT) 0.4 MG SL tablet Place 1 tablet (0.4 mg total) under the tongue every 5 (five) minutes x 3 doses as needed for chest pain (if no relief after 3rd dose, proceed to the ED for an evaluation). 75 tablet 1  . sacubitril-valsartan (ENTRESTO) 24-26 MG Take 1 tablet by mouth 2 (two) times daily. Start this 48 hours after stopping lisinopril-start taking this on Friday 04/05/18 60 tablet 6  . spironolactone (ALDACTONE) 25 MG tablet Take 0.5 tablets (12.5 mg total) by mouth daily. 45 tablet 1   No current facility-administered medications for this visit.     Allergies:   Patient has no known allergies.   Social History:  The patient  reports that he quit smoking about 7 years ago. His smoking use included cigarettes. He started smoking about 62 years ago. He has a 28.00 pack-year smoking history. He has never used smokeless tobacco. He reports that he does not drink alcohol or use drugs.   Family History:  The patient's  family history includes Coronary artery disease in his brother.   ROS:  Please see the history of present illness.   All other systems are personally reviewed and negative.    Exam:    Vital Signs:  There were no vitals taken for this visit.  Well sounding today   Labs/Other Tests and Data Reviewed:    Recent Labs: 06/28/2018: Hemoglobin 14.8; Platelets 268 07/18/2018: BUN 18; Creatinine, Ser 1.34; Potassium 4.3; Sodium 141   Wt Readings from Last 3 Encounters:  08/23/18 181 lb 12.8 oz (82.5 kg)  07/18/18 175 lb 7.8 oz (79.6 kg)  06/28/18 179 lb 9.6 oz (81.5 kg)     Last device remote is reviewed from PaceART PDF which reveals normal device function, no significant arrhythmias    ASSESSMENT & PLAN:    1.  Chronic systolic heart failure/sinus bradycardia Stable Continue current medical therapy See recent PaceArt report Will schedule for AV opt in [place] in 3 months and device  reprogramming Symptomatically improved post CRT  2.  CAD No recent ischemic symptoms Stable No change required today    Follow-up:  In 3 months in [place] for AV opt echo with EP NP   Patient Risk:  after full review of this patients clinical status, I feel that they are at moderate risk at this time.  Today, I have spent 15 minutes with the patient with telehealth technology discussing arrhythmia management .    Randolm IdolSigned, Danielle Mink, MD  11/22/2018 11:46 AM     [facility] [address] [place] [address] [phone] (office) [phone] (fax)

## 2018-11-24 NOTE — Progress Notes (Deleted)
{Choose 1 Note Type (Telehealth Visit or Telephone Visit):[phone]}   Date:  11/24/2018   ID:  Allen PuttSamuel J Balkcom, DOB 07/16/1933, MRN [id number]  {Patient Location:[phone]::"Home"} {Provider Location:([phone]::"Home"}  PCP:  Toma DeitersHasanaj, Xaje A, MD  Cardiologist:  Nona DellSamuel McDowell, MD Electrophysiologist:  Hillis RangeJames Allred, MD   Evaluation Performed:  {Choose Visit Type:[phone]::"Follow-Up Visit"}  Chief Complaint:  ***  History of Present Illness:    Allen [name] is an 85 y.o. male last seen in March.  He sees Dr. Johney FrameAllred in the device clinic, St. Jude biventricular ICD in place. I reviewed the most recent note from June 12. Plan is for echocardiogram with AV optimization in three months.  Recent echocardiogram showed LVEF 30-35% range (was down to 20% in October 2019).    The patient {does/does not:200015} have symptoms concerning for COVID-19 infection (fever, chills, cough, or new shortness of breath).    Past Medical History:  Diagnosis Date  . AICD (automatic cardioverter/defibrillator) present 07/17/2018   BIV  . CAD (coronary artery disease)    a. 04/2016: s/p CABG with LIMA-LAD, SVG-D1, SVG-RCA, and seq SVG-OM1-OM2  . Essential hypertension   . Hyperlipidemia   . Ischemic cardiomyopathy    LVEF 35% May 2016  . LBBB (left bundle branch block)   . Sinus bradycardia   . Type 2 diabetes mellitus (HCC)    Past Surgical History:  Procedure Laterality Date  . BIV ICD INSERTION CRT-D  07/17/2018  . BIV ICD INSERTION CRT-D N/A 07/17/2018   Procedure: BIV ICD INSERTION CRT-D;  Surgeon: Hillis RangeAllred, James, MD;  [facility];  Service: Cardiovascular;  Laterality: N/A;  . CARDIAC CATHETERIZATION N/A 11/02/2014   Procedure: Left Heart Cath and Coronary Angiography;  Surgeon: Lyn RecordsHenry W Smith, MD;  [facility];  Service: Cardiovascular;  Laterality: N/A;  . CARDIAC CATHETERIZATION N/A 05/03/2016   Procedure: Left Heart Cath and Coronary Angiography;   Surgeon: Peter M SwazilandJordan, MD;  [facility];  Service: Cardiovascular;  Laterality: N/A;  . CORONARY ARTERY BYPASS GRAFT N/A 05/08/2016   Procedure: CORONARY ARTERY BYPASS GRAFTING (CABG) x 5;  Surgeon: Alleen BorneBryan K Bartle, MD;  Location: [facility] OR;  Service: Open Heart Surgery;  Laterality: N/A;  . ENDOVEIN HARVEST OF GREATER SAPHENOUS VEIN  05/08/2016   Procedure: ENDOVEIN HARVEST OF GREATER SAPHENOUS VEIN;  Surgeon: Alleen BorneBryan K Bartle, MD;  Location: [facility] OR;  Service: Open Heart Surgery;;  . TEE WITHOUT CARDIOVERSION N/A 05/08/2016   Procedure: TRANSESOPHAGEAL ECHOCARDIOGRAM (TEE);  Surgeon: Alleen BorneBryan K Bartle, MD;  [facility];  Service: Open Heart Surgery;  Laterality: N/A;     No outpatient medications have been marked as taking for the 11/25/18 encounter (Appointment) with Jonelle SidleMcDowell, Ledger G, MD.     Allergies:   Patient has no known allergies.   Social History   Tobacco Use  . Smoking status: Former Smoker    Packs/day: 0.50    Years: 56.00    Pack years: 28.00    Types: Cigarettes    Start date: 02/18/1956    Quit date: 06/13/2011    Years since quitting: 7.4  . Smokeless tobacco: Never Used  Substance Use Topics  . Alcohol use: No    Alcohol/week: 0.0 standard drinks  . Drug use: No     Family Hx: The patient's family history includes Coronary artery disease in his brother.  ROS:   Please see the history of present illness.    *** All other systems reviewed and  are negative.   Prior CV studies:   The following studies were reviewed today:  Echocardiogram 11/20/2018:   1. The left ventricle has moderate-severely reduced systolic function, with an ejection fraction of 30-35%. The cavity size was mildly dilated. There is mildly increased left ventricular wall thickness. Left ventricular diastolic Doppler parameters are  consistent with impaired relaxation. Elevated mean left atrial pressure Left ventricular diffuse hypokinesis.  2. Left atrial size was mild-moderately  dilated.  3. Right atrial size was mildly dilated.  4. The aortic valve is tricuspid. Mild thickening of the aortic valve. Mild calcification of the aortic valve. Aortic valve regurgitation is mild to moderate by color flow Doppler. No stenosis of the aortic valve. Mild aortic annular calcification  noted.  5. The mitral valve is abnormal. Mild thickening of the mitral valve leaflet. Mild calcification of the mitral valve leaflet. There is mild mitral annular calcification present. No evidence of mitral valve stenosis.  6. Mild pulmonic stenosis.  7. Pulmonary hypertension is indeterminant, inadequate TR.  Labs/Other Tests and Data Reviewed:    EKG:  An ECG dated 07/18/2018 was personally reviewed today and demonstrated:  Dual chamber paced rhythm.  Recent Labs: 06/28/2018: Hemoglobin 14.8; Platelets 268 07/18/2018: BUN 18; Creatinine, Ser 1.34; Potassium 4.3; Sodium 141  March 2020: BUN 21, creatinine 1.37, potassium 4.3   Recent Lipid Panel Lab Results  Component Value Date/Time   CHOL 170 11/02/2014 02:13 AM   TRIG 121 11/02/2014 02:13 AM   HDL 45 11/02/2014 02:13 AM   CHOLHDL 3.8 11/02/2014 02:13 AM   LDLCALC 101 (H) 11/02/2014 02:13 AM    Wt Readings from Last 3 Encounters:  08/23/18 181 lb 12.8 oz (82.5 kg)  07/18/18 175 lb 7.8 oz (79.6 kg)  06/28/18 179 lb 9.6 oz (81.5 kg)     Objective:    Vital Signs:  There were no vitals taken for this visit.   {[facility] Virtual Exam (Optional):[phone]::"VITAL SIGNS:  reviewed"}  ASSESSMENT & PLAN:    1. ***  COVID-19 Education: The signs and symptoms of COVID-19 were discussed with the patient and how to seek care for testing (follow up with PCP or arrange E-visit).  ***The importance of social distancing was discussed today.  Time:   Today, I have spent *** minutes with the patient with telehealth technology discussing the above problems.     Medication Adjustments/Labs and Tests Ordered: Current medicines are reviewed  at length with the patient today.  Concerns regarding medicines are outlined above.   Tests Ordered: No orders of the defined types were placed in this encounter.   Medication Changes: No orders of the defined types were placed in this encounter.   Follow Up:  {F/U Format:[phone]} {follow up:15908}  Signed, Rozann Lesches, MD  11/24/2018 11:25 AM    [facility]

## 2018-11-25 ENCOUNTER — Telehealth: Payer: Medicare PPO | Admitting: Cardiology

## 2018-12-10 DIAGNOSIS — E669 Obesity, unspecified: Secondary | ICD-10-CM | POA: Diagnosis not present

## 2018-12-10 DIAGNOSIS — E119 Type 2 diabetes mellitus without complications: Secondary | ICD-10-CM | POA: Diagnosis not present

## 2018-12-10 DIAGNOSIS — E785 Hyperlipidemia, unspecified: Secondary | ICD-10-CM | POA: Diagnosis not present

## 2018-12-10 DIAGNOSIS — J449 Chronic obstructive pulmonary disease, unspecified: Secondary | ICD-10-CM | POA: Diagnosis not present

## 2018-12-10 DIAGNOSIS — I110 Hypertensive heart disease with heart failure: Secondary | ICD-10-CM | POA: Diagnosis not present

## 2018-12-10 DIAGNOSIS — I252 Old myocardial infarction: Secondary | ICD-10-CM | POA: Diagnosis not present

## 2018-12-10 DIAGNOSIS — K219 Gastro-esophageal reflux disease without esophagitis: Secondary | ICD-10-CM | POA: Diagnosis not present

## 2018-12-10 DIAGNOSIS — I509 Heart failure, unspecified: Secondary | ICD-10-CM | POA: Diagnosis not present

## 2018-12-10 DIAGNOSIS — I25118 Atherosclerotic heart disease of native coronary artery with other forms of angina pectoris: Secondary | ICD-10-CM | POA: Diagnosis not present

## 2018-12-26 DIAGNOSIS — E1121 Type 2 diabetes mellitus with diabetic nephropathy: Secondary | ICD-10-CM | POA: Diagnosis not present

## 2018-12-26 DIAGNOSIS — I10 Essential (primary) hypertension: Secondary | ICD-10-CM | POA: Diagnosis not present

## 2018-12-26 DIAGNOSIS — K210 Gastro-esophageal reflux disease with esophagitis: Secondary | ICD-10-CM | POA: Diagnosis not present

## 2019-01-07 DIAGNOSIS — E1121 Type 2 diabetes mellitus with diabetic nephropathy: Secondary | ICD-10-CM | POA: Diagnosis not present

## 2019-01-07 DIAGNOSIS — I130 Hypertensive heart and chronic kidney disease with heart failure and stage 1 through stage 4 chronic kidney disease, or unspecified chronic kidney disease: Secondary | ICD-10-CM | POA: Diagnosis not present

## 2019-01-07 DIAGNOSIS — Z0000 Encounter for general adult medical examination without abnormal findings: Secondary | ICD-10-CM | POA: Diagnosis not present

## 2019-01-07 DIAGNOSIS — M25561 Pain in right knee: Secondary | ICD-10-CM | POA: Diagnosis not present

## 2019-01-07 DIAGNOSIS — I5022 Chronic systolic (congestive) heart failure: Secondary | ICD-10-CM | POA: Diagnosis not present

## 2019-01-07 DIAGNOSIS — N182 Chronic kidney disease, stage 2 (mild): Secondary | ICD-10-CM | POA: Diagnosis not present

## 2019-01-07 DIAGNOSIS — Z6829 Body mass index (BMI) 29.0-29.9, adult: Secondary | ICD-10-CM | POA: Diagnosis not present

## 2019-01-07 DIAGNOSIS — Z1389 Encounter for screening for other disorder: Secondary | ICD-10-CM | POA: Diagnosis not present

## 2019-01-07 DIAGNOSIS — J449 Chronic obstructive pulmonary disease, unspecified: Secondary | ICD-10-CM | POA: Diagnosis not present

## 2019-01-14 ENCOUNTER — Ambulatory Visit (INDEPENDENT_AMBULATORY_CARE_PROVIDER_SITE_OTHER): Payer: Medicare PPO | Admitting: *Deleted

## 2019-01-14 DIAGNOSIS — I255 Ischemic cardiomyopathy: Secondary | ICD-10-CM

## 2019-01-15 LAB — CUP PACEART REMOTE DEVICE CHECK
Battery Remaining Longevity: 53 mo
Battery Remaining Percentage: 90 %
Battery Voltage: 3.07 V
Brady Statistic AP VP Percent: 88 %
Brady Statistic AP VS Percent: 1 %
Brady Statistic AS VP Percent: 9 %
Brady Statistic AS VS Percent: 1 %
Brady Statistic RA Percent Paced: 85 %
Date Time Interrogation Session: 20200805013141
HighPow Impedance: 63 Ohm
HighPow Impedance: 63 Ohm
Implantable Lead Implant Date: 20200205
Implantable Lead Implant Date: 20200205
Implantable Lead Implant Date: 20200205
Implantable Lead Location: 753858
Implantable Lead Location: 753859
Implantable Lead Location: 753860
Implantable Pulse Generator Implant Date: 20200205
Lead Channel Impedance Value: 430 Ohm
Lead Channel Impedance Value: 540 Ohm
Lead Channel Impedance Value: 610 Ohm
Lead Channel Pacing Threshold Amplitude: 0.625 V
Lead Channel Pacing Threshold Amplitude: 0.75 V
Lead Channel Pacing Threshold Amplitude: 1 V
Lead Channel Pacing Threshold Pulse Width: 0.5 ms
Lead Channel Pacing Threshold Pulse Width: 0.5 ms
Lead Channel Pacing Threshold Pulse Width: 0.5 ms
Lead Channel Sensing Intrinsic Amplitude: 12 mV
Lead Channel Sensing Intrinsic Amplitude: 3.8 mV
Lead Channel Setting Pacing Amplitude: 2 V
Lead Channel Setting Pacing Amplitude: 3.5 V
Lead Channel Setting Pacing Amplitude: 3.5 V
Lead Channel Setting Pacing Pulse Width: 0.5 ms
Lead Channel Setting Pacing Pulse Width: 0.5 ms
Lead Channel Setting Sensing Sensitivity: 0.5 mV
Pulse Gen Serial Number: 9880571

## 2019-01-21 ENCOUNTER — Encounter: Payer: Self-pay | Admitting: Cardiology

## 2019-01-21 NOTE — Progress Notes (Signed)
Remote ICD transmission.   

## 2019-02-05 ENCOUNTER — Telehealth (INDEPENDENT_AMBULATORY_CARE_PROVIDER_SITE_OTHER): Payer: Medicare PPO | Admitting: Cardiology

## 2019-02-05 ENCOUNTER — Encounter: Payer: Self-pay | Admitting: Cardiology

## 2019-02-05 ENCOUNTER — Telehealth: Payer: Self-pay | Admitting: *Deleted

## 2019-02-05 VITALS — BP 100/64 | Ht 62.0 in | Wt 182.0 lb

## 2019-02-05 DIAGNOSIS — I255 Ischemic cardiomyopathy: Secondary | ICD-10-CM

## 2019-02-05 DIAGNOSIS — Z951 Presence of aortocoronary bypass graft: Secondary | ICD-10-CM

## 2019-02-05 DIAGNOSIS — I25119 Atherosclerotic heart disease of native coronary artery with unspecified angina pectoris: Secondary | ICD-10-CM

## 2019-02-05 DIAGNOSIS — I10 Essential (primary) hypertension: Secondary | ICD-10-CM | POA: Diagnosis not present

## 2019-02-05 DIAGNOSIS — I5042 Chronic combined systolic (congestive) and diastolic (congestive) heart failure: Secondary | ICD-10-CM

## 2019-02-05 MED ORDER — CARVEDILOL 6.25 MG PO TABS: 6.2500 mg | ORAL_TABLET | Freq: Two times a day (BID) | ORAL | 3 refills | Status: DC

## 2019-02-05 NOTE — Telephone Encounter (Signed)
Patient advised to have his niece Joana Reamer to call office to discuss his medication changes. Verbalized understanding.

## 2019-02-05 NOTE — Progress Notes (Signed)
Virtual Visit via Telephone Note   This visit type was conducted due to national recommendations for restrictions regarding the COVID-19 Pandemic (e.g. social distancing) in an effort to limit this patient's exposure and mitigate transmission in our community.  Due to his co-morbid illnesses, this patient is at least at moderate risk for complications without adequate follow up.  This format is felt to be most appropriate for this patient at this time.  The patient did not have access to video technology/had technical difficulties with video requiring transitioning to audio format only (telephone).  All issues noted in this document were discussed and addressed.  No physical exam could be performed with this format.  Please refer to the patient's chart for his  consent to telehealth [facility].   Date:  02/05/2019   ID:  Allen [name], DOB 01/28/1934, MRN [id number]  Patient Location: Home Provider Location: Office  PCP:  Toma DeitersHasanaj, Xaje A, MD  Cardiologist:  Nona DellSamuel Pelham Hennick, MD Electrophysiologist:  Hillis RangeJames Allred, MD   Evaluation Performed:  Follow-Up Visit  Chief Complaint:   Cardiac follow-up  History of Present Illness:    Allen [name] is an 85 y.o. male last seen in March.  We spoke by phone today.  He tells me that he has been doing reasonably well.  He has had some knee pain but otherwise has been enjoying walking at his local mall.  He tries to do this most days of the week.  He walks about a mile slowly.  He sees Dr. Johney FrameAllred of device clinic, St. Jude biventricular ICD placed in February. He does not report any device discharges.  We went over his medications today.  Today's blood pressure he states is fairly typical for what he sees.  Our plan is to stop his Norvasc now and increase his Coreg further.  He is otherwise tolerating Entresto, Aldactone, and uses Lasix as needed.  The patient does not have symptoms concerning for COVID-19 infection (fever, chills, cough, or  new shortness of breath).    Past Medical History:  Diagnosis Date  . AICD (automatic cardioverter/defibrillator) present 07/17/2018   BIV  . CAD (coronary artery disease)    a. 04/2016: s/p CABG with LIMA-LAD, SVG-D1, SVG-RCA, and seq SVG-OM1-OM2  . Essential hypertension   . Hyperlipidemia   . Ischemic cardiomyopathy    LVEF 35% May 2016  . LBBB (left bundle branch block)   . Sinus bradycardia   . Type 2 diabetes mellitus (HCC)    Past Surgical History:  Procedure Laterality Date  . BIV ICD INSERTION CRT-D  07/17/2018  . BIV ICD INSERTION CRT-D N/A 07/17/2018   Procedure: BIV ICD INSERTION CRT-D;  Surgeon: Hillis RangeAllred, James, MD;  [facility];  Service: Cardiovascular;  Laterality: N/A;  . CARDIAC CATHETERIZATION N/A 11/02/2014   Procedure: Left Heart Cath and Coronary Angiography;  Surgeon: Lyn RecordsHenry W Smith, MD;  [facility];  Service: Cardiovascular;  Laterality: N/A;  . CARDIAC CATHETERIZATION N/A 05/03/2016   Procedure: Left Heart Cath and Coronary Angiography;  Surgeon: Peter M SwazilandJordan, MD;  [facility];  Service: Cardiovascular;  Laterality: N/A;  . CORONARY ARTERY BYPASS GRAFT N/A 05/08/2016   Procedure: CORONARY ARTERY BYPASS GRAFTING (CABG) x 5;  Surgeon: Alleen BorneBryan K Bartle, MD;  Location: [facility] OR;  Service: Open Heart Surgery;  Laterality: N/A;  . ENDOVEIN HARVEST OF GREATER SAPHENOUS VEIN  05/08/2016   Procedure: ENDOVEIN HARVEST OF GREATER SAPHENOUS VEIN;  Surgeon:  Alleen Borne, MD;  Location[facility];  Service: Open Heart Surgery;;  . TEE WITHOUT CARDIOVERSION N/A 05/08/2016   Procedure: TRANSESOPHAGEAL ECHOCARDIOGRAM (TEE);  Surgeon: Alleen Borne, MD;  Location[facility];  Service: Open Heart Surgery;  Laterality: N/A;     Current Meds  Medication Sig  . acetaminophen (TYLENOL) 325 MG tablet Take 650 mg by mouth every 6 (six) hours as needed (pain.).  Marland Kitchen aspirin 81 MG EC tablet Take 81 mg by mouth daily.  Marland Kitchen atorvastatin (LIPITOR) 80  MG tablet Take 1 tablet (80 mg total) by mouth daily at 6 PM.  . clopidogrel (PLAVIX) 75 MG tablet TAKE 1 TABLET ONE TIME DAILY WITH BREAKFAST (Patient taking differently: Take 75 mg by mouth daily with breakfast. )  . furosemide (LASIX) 20 MG tablet Take 20 mg by mouth daily as needed. Weight gain of 2-3 lbs in a 24 hour period or 5 lbs in one week  . glimepiride (AMARYL) 2 MG tablet Take 2 mg by mouth 2 (two) times daily.  . Insulin Glargine (LANTUS SOLOSTAR) 100 UNIT/ML Solostar Pen Inject 20 Units into the skin at bedtime.  . nitroGLYCERIN (NITROSTAT) 0.4 MG SL tablet Place 1 tablet (0.4 mg total) under the tongue every 5 (five) minutes x 3 doses as needed for chest pain (if no relief after 3rd dose, proceed to the ED for an evaluation).  . sacubitril-valsartan (ENTRESTO) 24-26 MG Take 1 tablet by mouth 2 (two) times daily. Start this 48 hours after stopping lisinopril-start taking this on Friday 04/05/18  . spironolactone (ALDACTONE) 25 MG tablet Take 0.5 tablets (12.5 mg total) by mouth daily.  . [DISCONTINUED] amLODipine (NORVASC) 5 MG tablet Take 1 tablet (5 mg total) by mouth daily.  . [DISCONTINUED] carvedilol (COREG) 3.125 MG tablet Take 1 tablet (3.125 mg total) by mouth 2 (two) times daily.     Allergies:   Patient has no known allergies.   Social History   Tobacco Use  . Smoking status: Former Smoker    Packs/day: 0.50    Years: 56.00    Pack years: 28.00    Types: Cigarettes    Start date: 02/18/1956    Quit date: 06/13/2011    Years since quitting: 7.6  . Smokeless tobacco: Never Used  Substance Use Topics  . Alcohol use: No    Alcohol/week: 0.0 standard drinks  . Drug use: No     Family Hx: The patient's family history includes Coronary artery disease in his brother.  ROS:   Please see the history of present illness. All other systems reviewed and are negative.   Prior CV studies:   The following studies were reviewed today:  Echocardiogram 11/20/2018:  1. The  left ventricle has moderate-severely reduced systolic function, with an ejection fraction of 30-35%. The cavity size was mildly dilated. There is mildly increased left ventricular wall thickness. Left ventricular diastolic Doppler parameters are  consistent with impaired relaxation. Elevated mean left atrial pressure Left ventricular diffuse hypokinesis.  2. Left atrial size was mild-moderately dilated.  3. Right atrial size was mildly dilated.  4. The aortic valve is tricuspid. Mild thickening of the aortic valve. Mild calcification of the aortic valve. Aortic valve regurgitation is mild to moderate by color flow Doppler. No stenosis of the aortic valve. Mild aortic annular calcification  noted.  5. The mitral valve is abnormal. Mild thickening of the mitral valve leaflet. Mild calcification of the mitral valve leaflet. There is mild mitral annular calcification present.  No evidence of mitral valve stenosis.  6. Mild pulmonic stenosis.  7. Pulmonary hypertension is indeterminant, inadequate TR.  Labs/Other Tests and Data Reviewed:    EKG:  An ECG dated 07/18/2018 was personally reviewed today and demonstrated:  Dual-chamber paced rhythm.  Recent Labs: 06/28/2018: Hemoglobin 14.8; Platelets 268 07/18/2018: BUN 18; Creatinine, Ser 1.34; Potassium 4.3; Sodium 141   Recent Lipid Panel Lab Results  Component Value Date/Time   CHOL 170 11/02/2014 02:13 AM   TRIG 121 11/02/2014 02:13 AM   HDL 45 11/02/2014 02:13 AM   CHOLHDL 3.8 11/02/2014 02:13 AM   LDLCALC 101 (H) 11/02/2014 02:13 AM    Wt Readings from Last 3 Encounters:  02/05/19 182 lb (82.6 kg)  08/23/18 181 lb 12.8 oz (82.5 kg)  07/18/18 175 lb 7.8 oz (79.6 kg)     Objective:    Vital Signs:  BP 100/64   Ht 5\' 2"  (1.575 m)   Wt 182 lb (82.6 kg)   BMI 33.29 kg/m    Patient spoke in full sentences, not short of breath. No audible wheezing or coughing. Speech pattern normal.  ASSESSMENT & PLAN:    1.  Mixed cardiomyopathy  with LVEF approximately 20%.  He is now status post St. Jude biventricular ICD and continues with medication titration.  No major change in weight.  Plan to stop Norvasc and uptitrate Coreg to 6.25 mg twice daily.  He is otherwise tolerating Entresto, Aldactone, and as needed Lasix.  Follow-up BMP T for next visit.  2.  Multivessel CAD status post CABG in November 2017.  He does not report any active angina symptoms and continues on antiplatelet regimen along with statin.  3.  Mixed hyperlipidemia, on Lipitor.  4.  Essential hypertension, blood pressure low normal today.  COVID-19 Education: The signs and symptoms of COVID-19 were discussed with the patient and how to seek care for testing (follow up with PCP or arrange E-visit).  The importance of social distancing was discussed today.  Time:   Today, I have spent 8 minutes with the patient with telehealth technology discussing the above problems.     Medication Adjustments/Labs and Tests Ordered: Current medicines are reviewed at length with the patient today.  Concerns regarding medicines are outlined above.   Tests Ordered: Orders Placed This Encounter  Procedures  . Basic metabolic panel    Medication Changes: Meds ordered this encounter  Medications  . carvedilol (COREG) 6.25 MG tablet    Sig: Take 1 tablet (6.25 mg total) by mouth 2 (two) times daily.    Dispense:  180 tablet    Refill:  3    02/05/2019 dose increase Stop amlodipine    Follow Up:  In Person 6 months in the [place] office.  Signed, Rozann Lesches, MD  02/05/2019 4:09 PM    [facility]

## 2019-02-05 NOTE — Patient Instructions (Addendum)
Medication Instructions:   Your physician has recommended you make the following change in your medication:   Stop amlodipine  Increase carvedilol 6.25 mg by mouth twice daily  Continue all other medications the same  Labwork:  Your physician recommends that you return for lab work in: 3 months before your visit to check your BMET. This is non-fasting lab work and can be done at [facility]. Your lab order has been faxed to their facility.  Testing/Procedures:  NONE  Follow-Up:  Your physician recommends that you schedule a follow-up appointment in: 3 months.   Any Other Special Instructions Will Be Listed Below (If Applicable).  If you need a refill on your cardiac medications before your next appointment, please call your pharmacy.

## 2019-02-06 MED ORDER — CARVEDILOL 6.25 MG PO TABS: 6.2500 mg | ORAL_TABLET | Freq: Two times a day (BID) | ORAL | 3 refills | Status: DC

## 2019-02-06 NOTE — Telephone Encounter (Signed)
Niece Joana Reamer informed medication changes on yesterday. Carvedilol rx sent to [facility] per niece request.

## 2019-02-20 ENCOUNTER — Telehealth: Payer: Self-pay | Admitting: Cardiology

## 2019-02-20 NOTE — Telephone Encounter (Signed)
I think we increased his Coreg from 3.125 mg twice daily to 6.25 mg twice daily at the last visit. Maybe he would consider cutting it back to the prior dose before we change to something else.

## 2019-02-20 NOTE — Telephone Encounter (Signed)
Patient thinks medication is making him to have short of breath carvedilol (COREG) 6.25 MG tablet

## 2019-02-20 NOTE — Telephone Encounter (Signed)
Called pt. No answer, left message for pt to return call.  

## 2019-02-20 NOTE — Telephone Encounter (Signed)
Returned pt call. He stated that since starting on coreg on 8/28 after LOV, he is having SOB. He stated that he does not have to be up moving around, as it is while he is sitting still as well. He is wondering if he can stop it and try another medication. Please advise.

## 2019-02-21 DIAGNOSIS — R351 Nocturia: Secondary | ICD-10-CM | POA: Diagnosis not present

## 2019-02-21 DIAGNOSIS — R3915 Urgency of urination: Secondary | ICD-10-CM | POA: Diagnosis not present

## 2019-02-21 DIAGNOSIS — R972 Elevated prostate specific antigen [PSA]: Secondary | ICD-10-CM | POA: Diagnosis not present

## 2019-02-21 DIAGNOSIS — R3129 Other microscopic hematuria: Secondary | ICD-10-CM | POA: Diagnosis not present

## 2019-02-21 DIAGNOSIS — N401 Enlarged prostate with lower urinary tract symptoms: Secondary | ICD-10-CM | POA: Diagnosis not present

## 2019-02-21 NOTE — Telephone Encounter (Signed)
Pt is returning call.  

## 2019-02-21 NOTE — Telephone Encounter (Signed)
Pt agreeable to cut back Coreg to 3.125 mg bid - will update if this doesn't help

## 2019-02-25 DIAGNOSIS — E1121 Type 2 diabetes mellitus with diabetic nephropathy: Secondary | ICD-10-CM | POA: Diagnosis not present

## 2019-02-25 DIAGNOSIS — N182 Chronic kidney disease, stage 2 (mild): Secondary | ICD-10-CM | POA: Diagnosis not present

## 2019-02-25 DIAGNOSIS — J449 Chronic obstructive pulmonary disease, unspecified: Secondary | ICD-10-CM | POA: Diagnosis not present

## 2019-02-25 DIAGNOSIS — I5022 Chronic systolic (congestive) heart failure: Secondary | ICD-10-CM | POA: Diagnosis not present

## 2019-03-07 ENCOUNTER — Encounter (HOSPITAL_COMMUNITY): Payer: Self-pay | Admitting: Internal Medicine

## 2019-04-01 DIAGNOSIS — J449 Chronic obstructive pulmonary disease, unspecified: Secondary | ICD-10-CM | POA: Diagnosis not present

## 2019-04-01 DIAGNOSIS — I5022 Chronic systolic (congestive) heart failure: Secondary | ICD-10-CM | POA: Diagnosis not present

## 2019-04-01 DIAGNOSIS — E1121 Type 2 diabetes mellitus with diabetic nephropathy: Secondary | ICD-10-CM | POA: Diagnosis not present

## 2019-04-01 DIAGNOSIS — N182 Chronic kidney disease, stage 2 (mild): Secondary | ICD-10-CM | POA: Diagnosis not present

## 2019-04-15 ENCOUNTER — Ambulatory Visit (INDEPENDENT_AMBULATORY_CARE_PROVIDER_SITE_OTHER): Payer: Medicare PPO | Admitting: *Deleted

## 2019-04-15 DIAGNOSIS — E1121 Type 2 diabetes mellitus with diabetic nephropathy: Secondary | ICD-10-CM | POA: Diagnosis not present

## 2019-04-15 DIAGNOSIS — I5022 Chronic systolic (congestive) heart failure: Secondary | ICD-10-CM | POA: Diagnosis not present

## 2019-04-15 DIAGNOSIS — J449 Chronic obstructive pulmonary disease, unspecified: Secondary | ICD-10-CM | POA: Diagnosis not present

## 2019-04-15 DIAGNOSIS — I255 Ischemic cardiomyopathy: Secondary | ICD-10-CM

## 2019-04-15 DIAGNOSIS — I447 Left bundle-branch block, unspecified: Secondary | ICD-10-CM

## 2019-04-15 DIAGNOSIS — N182 Chronic kidney disease, stage 2 (mild): Secondary | ICD-10-CM | POA: Diagnosis not present

## 2019-04-15 LAB — CUP PACEART REMOTE DEVICE CHECK
Battery Remaining Longevity: 51 mo
Battery Remaining Percentage: 86 %
Battery Voltage: 3.01 V
Brady Statistic AP VP Percent: 89 %
Brady Statistic AP VS Percent: 1 %
Brady Statistic AS VP Percent: 7.9 %
Brady Statistic AS VS Percent: 1 %
Brady Statistic RA Percent Paced: 86 %
Date Time Interrogation Session: 20201103070017
HighPow Impedance: 63 Ohm
HighPow Impedance: 63 Ohm
Implantable Lead Implant Date: 20200205
Implantable Lead Implant Date: 20200205
Implantable Lead Implant Date: 20200205
Implantable Lead Location: 753858
Implantable Lead Location: 753859
Implantable Lead Location: 753860
Implantable Pulse Generator Implant Date: 20200205
Lead Channel Impedance Value: 440 Ohm
Lead Channel Impedance Value: 580 Ohm
Lead Channel Impedance Value: 600 Ohm
Lead Channel Pacing Threshold Amplitude: 0.625 V
Lead Channel Pacing Threshold Amplitude: 0.75 V
Lead Channel Pacing Threshold Amplitude: 1 V
Lead Channel Pacing Threshold Pulse Width: 0.5 ms
Lead Channel Pacing Threshold Pulse Width: 0.5 ms
Lead Channel Pacing Threshold Pulse Width: 0.5 ms
Lead Channel Sensing Intrinsic Amplitude: 12 mV
Lead Channel Sensing Intrinsic Amplitude: 2.6 mV
Lead Channel Setting Pacing Amplitude: 2 V
Lead Channel Setting Pacing Amplitude: 3.5 V
Lead Channel Setting Pacing Amplitude: 3.5 V
Lead Channel Setting Pacing Pulse Width: 0.5 ms
Lead Channel Setting Pacing Pulse Width: 0.5 ms
Lead Channel Setting Sensing Sensitivity: 0.5 mV
Pulse Gen Serial Number: 9880571

## 2019-04-16 DIAGNOSIS — Z6830 Body mass index (BMI) 30.0-30.9, adult: Secondary | ICD-10-CM | POA: Diagnosis not present

## 2019-04-16 DIAGNOSIS — J449 Chronic obstructive pulmonary disease, unspecified: Secondary | ICD-10-CM | POA: Diagnosis not present

## 2019-04-16 DIAGNOSIS — I130 Hypertensive heart and chronic kidney disease with heart failure and stage 1 through stage 4 chronic kidney disease, or unspecified chronic kidney disease: Secondary | ICD-10-CM | POA: Diagnosis not present

## 2019-04-16 DIAGNOSIS — E1121 Type 2 diabetes mellitus with diabetic nephropathy: Secondary | ICD-10-CM | POA: Diagnosis not present

## 2019-04-16 DIAGNOSIS — M7050 Other bursitis of knee, unspecified knee: Secondary | ICD-10-CM | POA: Diagnosis not present

## 2019-04-16 DIAGNOSIS — N182 Chronic kidney disease, stage 2 (mild): Secondary | ICD-10-CM | POA: Diagnosis not present

## 2019-04-16 DIAGNOSIS — I5022 Chronic systolic (congestive) heart failure: Secondary | ICD-10-CM | POA: Diagnosis not present

## 2019-04-16 DIAGNOSIS — L570 Actinic keratosis: Secondary | ICD-10-CM | POA: Diagnosis not present

## 2019-04-16 DIAGNOSIS — M25561 Pain in right knee: Secondary | ICD-10-CM | POA: Diagnosis not present

## 2019-05-01 ENCOUNTER — Telehealth: Payer: Self-pay | Admitting: Cardiology

## 2019-05-01 MED ORDER — ENTRESTO 24-26 MG PO TABS: 1.0000 | ORAL_TABLET | Freq: Two times a day (BID) | ORAL | 6 refills | Status: DC

## 2019-05-01 NOTE — Telephone Encounter (Signed)
Can not afford Allen [name]

## 2019-05-01 NOTE — Telephone Encounter (Signed)
Unaware of cost says niece handles medication. Advised that samples are available for pick up. Advised that patient assistance application will be in back with medication for him to fill out and bring back to our office. Verbalized understanding of plan.

## 2019-05-05 NOTE — Progress Notes (Signed)
Remote ICD transmission.   

## 2019-05-15 DIAGNOSIS — I5042 Chronic combined systolic (congestive) and diastolic (congestive) heart failure: Secondary | ICD-10-CM | POA: Diagnosis not present

## 2019-05-16 ENCOUNTER — Ambulatory Visit: Payer: Medicare PPO | Admitting: Cardiology

## 2019-05-16 ENCOUNTER — Ambulatory Visit (INDEPENDENT_AMBULATORY_CARE_PROVIDER_SITE_OTHER): Payer: Medicare PPO | Admitting: Family Medicine

## 2019-05-16 ENCOUNTER — Encounter: Payer: Self-pay | Admitting: Cardiology

## 2019-05-16 ENCOUNTER — Other Ambulatory Visit: Payer: Self-pay

## 2019-05-16 VITALS — BP 129/73 | HR 63 | Ht 62.0 in | Wt 184.0 lb

## 2019-05-16 DIAGNOSIS — E782 Mixed hyperlipidemia: Secondary | ICD-10-CM

## 2019-05-16 DIAGNOSIS — I10 Essential (primary) hypertension: Secondary | ICD-10-CM | POA: Diagnosis not present

## 2019-05-16 DIAGNOSIS — I25119 Atherosclerotic heart disease of native coronary artery with unspecified angina pectoris: Secondary | ICD-10-CM | POA: Diagnosis not present

## 2019-05-16 DIAGNOSIS — I255 Ischemic cardiomyopathy: Secondary | ICD-10-CM

## 2019-05-16 MED ORDER — SPIRONOLACTONE 25 MG PO TABS: 12.5000 mg | ORAL_TABLET | Freq: Every day | ORAL | 3 refills | Status: DC

## 2019-05-16 NOTE — Progress Notes (Signed)
Cardiology Office Note  Date: 05/16/2019   ID: Allen [name], DOB 1934/05/03, MRN [id number]  PCP:  [name], MD  Cardiologist:  [name], MD Electrophysiologist:  Hillis Range, MD   Chief Complaint  Patient presents with  . Follow-up    History of Present Illness: Allen [name] is a 85 y.o. male last seen in February 05, 2019 via telemedicine with Dr. Diona Browner.  Patient had been experiencing right knee pain and had been enjoying walking at the mall most days of the week which he continues to do now.  He had a [employer] biventricular ICD placed in February of this year and had not reported any discharges of the device.  He sees Dr. Johney Frame at device clinic.  Most recent device check April 15, 2019 showed a normally functioning device.  Blood pressure was in the low 100 area systolic at last visit.  Patient stated this was normal for him.  Blood pressure on arrival today 129/73.    Patient states he decreased amount of Lasix  D/T frequency of urination. His niece dispenses his medications.  Past Medical History:  Diagnosis Date  . AICD (automatic cardioverter/defibrillator) present 07/17/2018   BIV  . CAD (coronary artery disease)    a. 04/2016: s/p CABG with LIMA-LAD, SVG-D1, SVG-RCA, and seq SVG-OM1-OM2  . Essential hypertension   . Hyperlipidemia   . Ischemic cardiomyopathy    LVEF 35% May 2016  . LBBB (left bundle branch block)   . Sinus bradycardia   . Type 2 diabetes mellitus (HCC)     Past Surgical History:  Procedure Laterality Date  . BIV ICD INSERTION CRT-D  07/17/2018  . BIV ICD INSERTION CRT-D N/A 07/17/2018   Procedure: BIV ICD INSERTION CRT-D;  Surgeon: Hillis Range, MD;  Location: [facility] INVASIVE CV LAB;  Service: Cardiovascular;  Laterality: N/A;  . CARDIAC CATHETERIZATION N/A 11/02/2014   Procedure: Left Heart Cath and Coronary Angiography;  Surgeon: Lyn Records, MD;  Location: [facility] INVASIVE CV LAB;  Service: Cardiovascular;  Laterality: N/A;   . CARDIAC CATHETERIZATION N/A 05/03/2016   Procedure: Left Heart Cath and Coronary Angiography;  Surgeon: Peter M Swaziland, MD;  Location: [facility] INVASIVE CV LAB;  Service: Cardiovascular;  Laterality: N/A;  . CORONARY ARTERY BYPASS GRAFT N/A 05/08/2016   Procedure: CORONARY ARTERY BYPASS GRAFTING (CABG) x 5;  Surgeon: Alleen Borne, MD;  Location: [facility] OR;  Service: Open Heart Surgery;  Laterality: N/A;  . ENDOVEIN HARVEST OF GREATER SAPHENOUS VEIN  05/08/2016   Procedure: ENDOVEIN HARVEST OF GREATER SAPHENOUS VEIN;  Surgeon: Alleen Borne, MD;  Location: [facility] OR;  Service: Open Heart Surgery;;  . TEE WITHOUT CARDIOVERSION N/A 05/08/2016   Procedure: TRANSESOPHAGEAL ECHOCARDIOGRAM (TEE);  Surgeon: Alleen Borne, MD;  Location: [facility] OR;  Service: Open Heart Surgery;  Laterality: N/A;    Current Outpatient Medications  Medication Sig Dispense Refill  . amLODipine (NORVASC) 5 MG tablet Take 5 mg by mouth daily.     Marland Kitchen aspirin 81 MG EC tablet Take 81 mg by mouth daily.    Marland Kitchen atorvastatin (LIPITOR) 80 MG tablet Take 1 tablet (80 mg total) by mouth daily at 6 PM. 90 tablet 3  . carvedilol (COREG) 3.125 MG tablet Take 3.125 mg by mouth 2 (two) times daily with a meal.    . clopidogrel (PLAVIX) 75 MG tablet TAKE 1 TABLET ONE TIME DAILY WITH BREAKFAST (Patient taking differently: Take 75 mg by mouth daily with breakfast. )  90 tablet 3  . furosemide (LASIX) 20 MG tablet Take 20 mg by mouth daily as needed. Weight gain of 2-3 lbs in a 24 hour period or 5 lbs in one week    . glimepiride (AMARYL) 2 MG tablet Take 2 mg by mouth 2 (two) times daily.    . Insulin Glargine (LANTUS SOLOSTAR) 100 UNIT/ML Solostar Pen Inject 20 Units into the skin at bedtime. 15 mL 11  . nitroGLYCERIN (NITROSTAT) 0.4 MG SL tablet Place 1 tablet (0.4 mg total) under the tongue every 5 (five) minutes x 3 doses as needed for chest pain (if no relief after 3rd dose, proceed to the ED for an evaluation). 75 tablet 1  . sacubitril-valsartan  (ENTRESTO) 24-26 MG Take 1 tablet by mouth 2 (two) times daily. Start this 48 hours after stopping lisinopril-start taking this on Friday 04/05/18 60 tablet 6  . spironolactone (ALDACTONE) 25 MG tablet Take 0.5 tablets (12.5 mg total) by mouth daily. 45 tablet 3   No current facility-administered medications for this visit.    Allergies:  Patient has no known allergies.   Social History: The patient  reports that he quit smoking about 7 years ago. His smoking use included cigarettes. He started smoking about 63 years ago. He has a 28.00 pack-year smoking history. He has never used smokeless tobacco. He reports that he does not drink alcohol or use drugs.   Family History: The patient's family history includes Coronary artery disease in his brother.   ROS:  Please see the history of present illness. Otherwise, complete review of systems is positive for none.  All other systems are reviewed and negative.   Physical Exam: VS:  BP 129/73   Pulse 63   Ht 5\' 2"  (1.575 m)   Wt 184 lb (83.5 kg)   SpO2 96%   BMI 33.65 kg/m , BMI Body mass index is 33.65 kg/m.  Wt Readings from Last 3 Encounters:  05/16/19 184 lb (83.5 kg)  02/05/19 182 lb (82.6 kg)  08/23/18 181 lb 12.8 oz (82.5 kg)    General: Patient appears comfortable at rest. HEENT: Conjunctiva and lids normal, oropharynx clear with moist mucosa. Neck: Supple, no elevated JVP or carotid bruits, no thyromegaly. Lungs: Clear to auscultation, nonlabored breathing at rest. Cardiac: Regular rate and rhythm, no S3 or significant systolic murmur, no pericardial rub. Abdomen: Soft, nontender, no hepatomegaly, bowel sounds present, no guarding or rebound. Extremities: No pitting edema, distal pulses 2+. Skin:  Pacemaker insertion site clean ,Warm and dry. Musculoskeletal: No kyphosis. Neuropsychiatric: Alert and oriented x3, affect grossly appropriate.  ECG:  An ECG dated July 18, 2018 was personally reviewed today and demonstrated:  AV  paced rhythm rate of 70  Recent Labwork:  BMP  05/15/2019 glucose 179, BUN 16, creatinine 1.21, GFR greater than 60, calcium 11, sodium 141, potassium 4.7   06/28/2018: Hemoglobin 14.8; Platelets 268 07/18/2018: BUN 18; Creatinine, Ser 1.34; Potassium 4.3; Sodium 141     Component Value Date/Time   CHOL 170 11/02/2014 0213   TRIG 121 11/02/2014 0213   HDL 45 11/02/2014 0213   CHOLHDL 3.8 11/02/2014 0213   VLDL 24 11/02/2014 0213   LDLCALC 101 (H) 11/02/2014 0213    Other Studies Reviewed Today:  Echocardiogram 11/20/2018: 1. The left ventricle has moderate-severely reduced systolic function, with an ejection fraction of 30-35%. The cavity size was mildly dilated. There is mildly increased left ventricular wall thickness. Left ventricular diastolic Doppler parameters are  consistent with impaired  relaxation. Elevated mean left atrial pressure Left ventricular diffuse hypokinesis. 2. Left atrial size was mild-moderately dilated. 3. Right atrial size was mildly dilated. 4. The aortic valve is tricuspid. Mild thickening of the aortic valve. Mild calcification of the aortic valve. Aortic valve regurgitation is mild to moderate by color flow Doppler. No stenosis of the aortic valve. Mild aortic annular calcification noted. 5. The mitral valve is abnormal. Mild thickening of the mitral valve leaflet. Mild calcification of the mitral valve leaflet. There is mild mitral annular calcification present. No evidence of mitral valve stenosis. 6. Mild pulmonic stenosis. 7. Pulmonary hypertension is indeterminant, inadequate TR  .Assessment and Plan:  1. Coronary artery disease involving native coronary artery of native heart with angina pectoris (HCC)   2. Mixed hyperlipidemia   3. Essential hypertension   4. Ischemic cardiomyopathy     1. Coronary artery disease involving native coronary artery of native heart with angina pectoris (HCC) S/P CABG 2017.   On aspirin 81 mg, Plavix 75 mg,  atorvastatin 80 mg.  Patient denies any recent progressive anginal or exertional symptoms.  He continues to walk at the mall several days a week. Continue current  medication therapy  2. Mixed hyperlipidemia Taking Lipitor 80 mg daily. Will need a FLP at next visit  3. Essential hypertension Blood pressure today 129/73. Continue Norvasc 5 mg qd  a nd carvedilol  3.125 mg  BID at current dosages. He did not tolerate stopping Norvasc and increasing Coreg to 6.25 mg   4. Ischemic cardiomyopathy Last echo in June 2020 demonstrated ejection fraction 30 to 35%.  Left ventricular diastolic Doppler parameters were consistent with impaired relaxation.  Recent BiV ICD placed in February of this year and functioning well at last check in November.  Sees Dr. Johney FrameAllred. EP.  Adjust Lasix 20 mg to as needed for weight gain of 3 to 5 pounds in 2 to 3 days.  Entresto 24/26 mg daily.  Restart spironolactone 12.5 mg daily. It had been stopped previously.  Medication Adjustments/Labs and Tests Ordered: Current medicines are reviewed at length with the patient today.  Concerns regarding medicines are outlined above.    Patient Instructions  Medication Instructions:   Your physician has recommended you make the following change in your medication:   Start spironolactone 12.5 mg by mouth daily  Continue other medications the same  Please take furosemide 20 mg daily as needed only for weight gain of 2-3 lbs/24 hours or 5 lbs in one week  Labwork:  Your physician recommends that you return for lab work in: 10 days to check your BMET. [facility].  Testing/Procedures:  NONE  Follow-Up:  Your physician recommends that you schedule a follow-up appointment in: 6 months. You will receive a reminder letter in the mail in about 4 months reminding you to call and schedule your appointment. If you don't receive this letter, please contact our office.  Any Other Special Instructions  Will Be Listed Below (If Applicable).  If you need a refill on your cardiac medications before your next appointment, please call your pharmacy.        Signed, Rennis HardingAndrew Quinn, NP 05/16/2019 3:13 PM    [facility] [address], [place], [place] [address] Phone: [phone]) [phone]; Fax: [phone]

## 2019-05-16 NOTE — Patient Instructions (Addendum)
Medication Instructions:   Your physician has recommended you make the following change in your medication:   Start spironolactone 12.5 mg by mouth daily  Continue other medications the same  Please take furosemide 20 mg daily as needed only for weight gain of 2-3 lbs/24 hours or 5 lbs in one week  Labwork:  Your physician recommends that you return for lab work in: 10 days to check your BMET. Please have this done at [facility].  Testing/Procedures:  NONE  Follow-Up:  Your physician recommends that you schedule a follow-up appointment in: 6 months. You will receive a reminder letter in the mail in about 4 months reminding you to call and schedule your appointment. If you don't receive this letter, please contact our office.  Any Other Special Instructions Will Be Listed Below (If Applicable).  If you need a refill on your cardiac medications before your next appointment, please call your pharmacy.

## 2019-05-27 ENCOUNTER — Encounter: Payer: Self-pay | Admitting: *Deleted

## 2019-05-27 DIAGNOSIS — I255 Ischemic cardiomyopathy: Secondary | ICD-10-CM | POA: Diagnosis not present

## 2019-05-27 DIAGNOSIS — I25119 Atherosclerotic heart disease of native coronary artery with unspecified angina pectoris: Secondary | ICD-10-CM | POA: Diagnosis not present

## 2019-05-27 DIAGNOSIS — I10 Essential (primary) hypertension: Secondary | ICD-10-CM | POA: Diagnosis not present

## 2019-05-29 ENCOUNTER — Telehealth: Payer: Self-pay | Admitting: *Deleted

## 2019-05-29 NOTE — Telephone Encounter (Signed)
Patient informed. Copy sent to PCP °

## 2019-05-29 NOTE — Telephone Encounter (Signed)
-----   Message from Satira Sark, MD sent at 05/27/2019  1:23 PM EST ----- Results reviewed.  Follow-up lab work shows normal renal function potassium following recent medication adjustments.  Continue same for now.

## 2019-06-06 DIAGNOSIS — Z794 Long term (current) use of insulin: Secondary | ICD-10-CM | POA: Diagnosis not present

## 2019-06-06 DIAGNOSIS — Z20828 Contact with and (suspected) exposure to other viral communicable diseases: Secondary | ICD-10-CM | POA: Diagnosis not present

## 2019-06-06 DIAGNOSIS — R05 Cough: Secondary | ICD-10-CM | POA: Diagnosis not present

## 2019-06-06 DIAGNOSIS — Z833 Family history of diabetes mellitus: Secondary | ICD-10-CM | POA: Diagnosis not present

## 2019-06-06 DIAGNOSIS — Z8249 Family history of ischemic heart disease and other diseases of the circulatory system: Secondary | ICD-10-CM | POA: Diagnosis not present

## 2019-06-06 DIAGNOSIS — R062 Wheezing: Secondary | ICD-10-CM | POA: Diagnosis not present

## 2019-06-06 DIAGNOSIS — J209 Acute bronchitis, unspecified: Secondary | ICD-10-CM | POA: Diagnosis not present

## 2019-06-06 DIAGNOSIS — I10 Essential (primary) hypertension: Secondary | ICD-10-CM | POA: Diagnosis not present

## 2019-06-06 DIAGNOSIS — E119 Type 2 diabetes mellitus without complications: Secondary | ICD-10-CM | POA: Diagnosis not present

## 2019-06-06 DIAGNOSIS — E7800 Pure hypercholesterolemia, unspecified: Secondary | ICD-10-CM | POA: Diagnosis not present

## 2019-06-06 DIAGNOSIS — I252 Old myocardial infarction: Secondary | ICD-10-CM | POA: Diagnosis not present

## 2019-06-09 DIAGNOSIS — R05 Cough: Secondary | ICD-10-CM | POA: Diagnosis not present

## 2019-06-09 DIAGNOSIS — R0989 Other specified symptoms and signs involving the circulatory and respiratory systems: Secondary | ICD-10-CM | POA: Diagnosis not present

## 2019-06-17 DIAGNOSIS — I5043 Acute on chronic combined systolic (congestive) and diastolic (congestive) heart failure: Secondary | ICD-10-CM | POA: Diagnosis not present

## 2019-06-17 DIAGNOSIS — J1282 Pneumonia due to coronavirus disease 2019: Secondary | ICD-10-CM | POA: Diagnosis not present

## 2019-06-17 DIAGNOSIS — R918 Other nonspecific abnormal finding of lung field: Secondary | ICD-10-CM | POA: Diagnosis not present

## 2019-06-17 DIAGNOSIS — E1122 Type 2 diabetes mellitus with diabetic chronic kidney disease: Secondary | ICD-10-CM | POA: Diagnosis not present

## 2019-06-17 DIAGNOSIS — J9601 Acute respiratory failure with hypoxia: Secondary | ICD-10-CM | POA: Diagnosis not present

## 2019-06-17 DIAGNOSIS — J129 Viral pneumonia, unspecified: Secondary | ICD-10-CM | POA: Diagnosis not present

## 2019-06-17 DIAGNOSIS — J9690 Respiratory failure, unspecified, unspecified whether with hypoxia or hypercapnia: Secondary | ICD-10-CM | POA: Diagnosis not present

## 2019-06-17 DIAGNOSIS — I10 Essential (primary) hypertension: Secondary | ICD-10-CM | POA: Diagnosis not present

## 2019-06-17 DIAGNOSIS — N182 Chronic kidney disease, stage 2 (mild): Secondary | ICD-10-CM | POA: Diagnosis not present

## 2019-06-17 DIAGNOSIS — I5022 Chronic systolic (congestive) heart failure: Secondary | ICD-10-CM | POA: Diagnosis not present

## 2019-06-17 DIAGNOSIS — R7989 Other specified abnormal findings of blood chemistry: Secondary | ICD-10-CM | POA: Diagnosis not present

## 2019-06-17 DIAGNOSIS — J189 Pneumonia, unspecified organism: Secondary | ICD-10-CM | POA: Diagnosis not present

## 2019-06-17 DIAGNOSIS — I2699 Other pulmonary embolism without acute cor pulmonale: Secondary | ICD-10-CM | POA: Diagnosis not present

## 2019-06-17 DIAGNOSIS — J440 Chronic obstructive pulmonary disease with acute lower respiratory infection: Secondary | ICD-10-CM | POA: Diagnosis not present

## 2019-06-17 DIAGNOSIS — I130 Hypertensive heart and chronic kidney disease with heart failure and stage 1 through stage 4 chronic kidney disease, or unspecified chronic kidney disease: Secondary | ICD-10-CM | POA: Diagnosis not present

## 2019-06-17 DIAGNOSIS — R0602 Shortness of breath: Secondary | ICD-10-CM | POA: Diagnosis not present

## 2019-06-17 DIAGNOSIS — E1121 Type 2 diabetes mellitus with diabetic nephropathy: Secondary | ICD-10-CM | POA: Diagnosis not present

## 2019-06-17 DIAGNOSIS — E872 Acidosis: Secondary | ICD-10-CM | POA: Diagnosis not present

## 2019-06-17 DIAGNOSIS — U071 COVID-19: Secondary | ICD-10-CM | POA: Diagnosis not present

## 2019-06-17 DIAGNOSIS — J1281 Pneumonia due to SARS-associated coronavirus: Secondary | ICD-10-CM | POA: Diagnosis not present

## 2019-06-17 DIAGNOSIS — J449 Chronic obstructive pulmonary disease, unspecified: Secondary | ICD-10-CM | POA: Diagnosis not present

## 2019-06-17 DIAGNOSIS — N183 Chronic kidney disease, stage 3 unspecified: Secondary | ICD-10-CM | POA: Diagnosis not present

## 2019-07-03 ENCOUNTER — Other Ambulatory Visit: Payer: Self-pay | Admitting: Cardiology

## 2019-07-04 DIAGNOSIS — I130 Hypertensive heart and chronic kidney disease with heart failure and stage 1 through stage 4 chronic kidney disease, or unspecified chronic kidney disease: Secondary | ICD-10-CM | POA: Diagnosis not present

## 2019-07-04 DIAGNOSIS — J9621 Acute and chronic respiratory failure with hypoxia: Secondary | ICD-10-CM | POA: Diagnosis not present

## 2019-07-04 DIAGNOSIS — I509 Heart failure, unspecified: Secondary | ICD-10-CM | POA: Diagnosis not present

## 2019-07-04 DIAGNOSIS — U071 COVID-19: Secondary | ICD-10-CM | POA: Diagnosis not present

## 2019-07-04 DIAGNOSIS — I252 Old myocardial infarction: Secondary | ICD-10-CM | POA: Diagnosis not present

## 2019-07-04 DIAGNOSIS — E1122 Type 2 diabetes mellitus with diabetic chronic kidney disease: Secondary | ICD-10-CM | POA: Diagnosis not present

## 2019-07-08 DIAGNOSIS — E1122 Type 2 diabetes mellitus with diabetic chronic kidney disease: Secondary | ICD-10-CM | POA: Diagnosis not present

## 2019-07-08 DIAGNOSIS — Z6830 Body mass index (BMI) 30.0-30.9, adult: Secondary | ICD-10-CM | POA: Diagnosis not present

## 2019-07-08 DIAGNOSIS — E1121 Type 2 diabetes mellitus with diabetic nephropathy: Secondary | ICD-10-CM | POA: Diagnosis not present

## 2019-07-08 DIAGNOSIS — N182 Chronic kidney disease, stage 2 (mild): Secondary | ICD-10-CM | POA: Diagnosis not present

## 2019-07-08 DIAGNOSIS — I5022 Chronic systolic (congestive) heart failure: Secondary | ICD-10-CM | POA: Diagnosis not present

## 2019-07-08 DIAGNOSIS — J9621 Acute and chronic respiratory failure with hypoxia: Secondary | ICD-10-CM | POA: Diagnosis not present

## 2019-07-08 DIAGNOSIS — I509 Heart failure, unspecified: Secondary | ICD-10-CM | POA: Diagnosis not present

## 2019-07-08 DIAGNOSIS — I130 Hypertensive heart and chronic kidney disease with heart failure and stage 1 through stage 4 chronic kidney disease, or unspecified chronic kidney disease: Secondary | ICD-10-CM | POA: Diagnosis not present

## 2019-07-08 DIAGNOSIS — U071 COVID-19: Secondary | ICD-10-CM | POA: Diagnosis not present

## 2019-07-08 DIAGNOSIS — I252 Old myocardial infarction: Secondary | ICD-10-CM | POA: Diagnosis not present

## 2019-07-08 DIAGNOSIS — J449 Chronic obstructive pulmonary disease, unspecified: Secondary | ICD-10-CM | POA: Diagnosis not present

## 2019-07-11 DIAGNOSIS — E1122 Type 2 diabetes mellitus with diabetic chronic kidney disease: Secondary | ICD-10-CM | POA: Diagnosis not present

## 2019-07-11 DIAGNOSIS — I130 Hypertensive heart and chronic kidney disease with heart failure and stage 1 through stage 4 chronic kidney disease, or unspecified chronic kidney disease: Secondary | ICD-10-CM | POA: Diagnosis not present

## 2019-07-11 DIAGNOSIS — I509 Heart failure, unspecified: Secondary | ICD-10-CM | POA: Diagnosis not present

## 2019-07-11 DIAGNOSIS — J9621 Acute and chronic respiratory failure with hypoxia: Secondary | ICD-10-CM | POA: Diagnosis not present

## 2019-07-11 DIAGNOSIS — I252 Old myocardial infarction: Secondary | ICD-10-CM | POA: Diagnosis not present

## 2019-07-11 DIAGNOSIS — U071 COVID-19: Secondary | ICD-10-CM | POA: Diagnosis not present

## 2019-07-14 DIAGNOSIS — J449 Chronic obstructive pulmonary disease, unspecified: Secondary | ICD-10-CM | POA: Diagnosis not present

## 2019-07-14 DIAGNOSIS — U071 COVID-19: Secondary | ICD-10-CM | POA: Diagnosis not present

## 2019-07-15 ENCOUNTER — Ambulatory Visit (INDEPENDENT_AMBULATORY_CARE_PROVIDER_SITE_OTHER): Payer: Medicare PPO | Admitting: *Deleted

## 2019-07-15 DIAGNOSIS — I255 Ischemic cardiomyopathy: Secondary | ICD-10-CM

## 2019-07-15 DIAGNOSIS — J9621 Acute and chronic respiratory failure with hypoxia: Secondary | ICD-10-CM | POA: Diagnosis not present

## 2019-07-15 DIAGNOSIS — I252 Old myocardial infarction: Secondary | ICD-10-CM | POA: Diagnosis not present

## 2019-07-15 DIAGNOSIS — U071 COVID-19: Secondary | ICD-10-CM | POA: Diagnosis not present

## 2019-07-15 DIAGNOSIS — I509 Heart failure, unspecified: Secondary | ICD-10-CM | POA: Diagnosis not present

## 2019-07-15 DIAGNOSIS — I130 Hypertensive heart and chronic kidney disease with heart failure and stage 1 through stage 4 chronic kidney disease, or unspecified chronic kidney disease: Secondary | ICD-10-CM | POA: Diagnosis not present

## 2019-07-15 DIAGNOSIS — E1122 Type 2 diabetes mellitus with diabetic chronic kidney disease: Secondary | ICD-10-CM | POA: Diagnosis not present

## 2019-07-15 LAB — CUP PACEART REMOTE DEVICE CHECK
Battery Remaining Longevity: 50 mo
Battery Remaining Percentage: 82 %
Battery Voltage: 2.98 V
Brady Statistic AP VP Percent: 86 %
Brady Statistic AP VS Percent: 1 %
Brady Statistic AS VP Percent: 11 %
Brady Statistic AS VS Percent: 1 %
Brady Statistic RA Percent Paced: 84 %
Date Time Interrogation Session: 20210202020017
HighPow Impedance: 79 Ohm
HighPow Impedance: 79 Ohm
Implantable Lead Implant Date: 20200205
Implantable Lead Implant Date: 20200205
Implantable Lead Implant Date: 20200205
Implantable Lead Location: 753858
Implantable Lead Location: 753859
Implantable Lead Location: 753860
Implantable Pulse Generator Implant Date: 20200205
Lead Channel Impedance Value: 450 Ohm
Lead Channel Impedance Value: 550 Ohm
Lead Channel Impedance Value: 730 Ohm
Lead Channel Pacing Threshold Amplitude: 0.5 V
Lead Channel Pacing Threshold Amplitude: 0.75 V
Lead Channel Pacing Threshold Amplitude: 1 V
Lead Channel Pacing Threshold Pulse Width: 0.5 ms
Lead Channel Pacing Threshold Pulse Width: 0.5 ms
Lead Channel Pacing Threshold Pulse Width: 0.5 ms
Lead Channel Sensing Intrinsic Amplitude: 12 mV
Lead Channel Sensing Intrinsic Amplitude: 5 mV
Lead Channel Setting Pacing Amplitude: 2 V
Lead Channel Setting Pacing Amplitude: 3.5 V
Lead Channel Setting Pacing Amplitude: 3.5 V
Lead Channel Setting Pacing Pulse Width: 0.5 ms
Lead Channel Setting Pacing Pulse Width: 0.5 ms
Lead Channel Setting Sensing Sensitivity: 0.5 mV
Pulse Gen Serial Number: 9880571

## 2019-07-15 NOTE — Progress Notes (Signed)
ICD Remote  

## 2019-07-17 DIAGNOSIS — U071 COVID-19: Secondary | ICD-10-CM | POA: Diagnosis not present

## 2019-07-17 DIAGNOSIS — E1122 Type 2 diabetes mellitus with diabetic chronic kidney disease: Secondary | ICD-10-CM | POA: Diagnosis not present

## 2019-07-17 DIAGNOSIS — I252 Old myocardial infarction: Secondary | ICD-10-CM | POA: Diagnosis not present

## 2019-07-17 DIAGNOSIS — I130 Hypertensive heart and chronic kidney disease with heart failure and stage 1 through stage 4 chronic kidney disease, or unspecified chronic kidney disease: Secondary | ICD-10-CM | POA: Diagnosis not present

## 2019-07-17 DIAGNOSIS — I509 Heart failure, unspecified: Secondary | ICD-10-CM | POA: Diagnosis not present

## 2019-07-17 DIAGNOSIS — J9621 Acute and chronic respiratory failure with hypoxia: Secondary | ICD-10-CM | POA: Diagnosis not present

## 2019-07-21 DIAGNOSIS — U071 COVID-19: Secondary | ICD-10-CM | POA: Diagnosis not present

## 2019-07-21 DIAGNOSIS — E1122 Type 2 diabetes mellitus with diabetic chronic kidney disease: Secondary | ICD-10-CM | POA: Diagnosis not present

## 2019-07-21 DIAGNOSIS — I130 Hypertensive heart and chronic kidney disease with heart failure and stage 1 through stage 4 chronic kidney disease, or unspecified chronic kidney disease: Secondary | ICD-10-CM | POA: Diagnosis not present

## 2019-07-21 DIAGNOSIS — I252 Old myocardial infarction: Secondary | ICD-10-CM | POA: Diagnosis not present

## 2019-07-21 DIAGNOSIS — J9621 Acute and chronic respiratory failure with hypoxia: Secondary | ICD-10-CM | POA: Diagnosis not present

## 2019-07-21 DIAGNOSIS — I509 Heart failure, unspecified: Secondary | ICD-10-CM | POA: Diagnosis not present

## 2019-07-28 DIAGNOSIS — I130 Hypertensive heart and chronic kidney disease with heart failure and stage 1 through stage 4 chronic kidney disease, or unspecified chronic kidney disease: Secondary | ICD-10-CM | POA: Diagnosis not present

## 2019-07-28 DIAGNOSIS — I252 Old myocardial infarction: Secondary | ICD-10-CM | POA: Diagnosis not present

## 2019-07-28 DIAGNOSIS — J9621 Acute and chronic respiratory failure with hypoxia: Secondary | ICD-10-CM | POA: Diagnosis not present

## 2019-07-28 DIAGNOSIS — E1122 Type 2 diabetes mellitus with diabetic chronic kidney disease: Secondary | ICD-10-CM | POA: Diagnosis not present

## 2019-07-28 DIAGNOSIS — I509 Heart failure, unspecified: Secondary | ICD-10-CM | POA: Diagnosis not present

## 2019-07-28 DIAGNOSIS — U071 COVID-19: Secondary | ICD-10-CM | POA: Diagnosis not present

## 2019-07-29 DIAGNOSIS — I10 Essential (primary) hypertension: Secondary | ICD-10-CM | POA: Diagnosis not present

## 2019-07-29 DIAGNOSIS — K219 Gastro-esophageal reflux disease without esophagitis: Secondary | ICD-10-CM | POA: Diagnosis not present

## 2019-07-29 DIAGNOSIS — E1121 Type 2 diabetes mellitus with diabetic nephropathy: Secondary | ICD-10-CM | POA: Diagnosis not present

## 2019-07-29 DIAGNOSIS — Z6825 Body mass index (BMI) 25.0-25.9, adult: Secondary | ICD-10-CM | POA: Diagnosis not present

## 2019-07-29 DIAGNOSIS — Z0000 Encounter for general adult medical examination without abnormal findings: Secondary | ICD-10-CM | POA: Diagnosis not present

## 2019-07-29 DIAGNOSIS — N1831 Chronic kidney disease, stage 3a: Secondary | ICD-10-CM | POA: Diagnosis not present

## 2019-07-29 DIAGNOSIS — I5022 Chronic systolic (congestive) heart failure: Secondary | ICD-10-CM | POA: Diagnosis not present

## 2019-08-04 ENCOUNTER — Telehealth: Payer: Self-pay | Admitting: *Deleted

## 2019-08-04 ENCOUNTER — Encounter: Payer: Self-pay | Admitting: *Deleted

## 2019-08-04 DIAGNOSIS — I252 Old myocardial infarction: Secondary | ICD-10-CM | POA: Diagnosis not present

## 2019-08-04 DIAGNOSIS — I130 Hypertensive heart and chronic kidney disease with heart failure and stage 1 through stage 4 chronic kidney disease, or unspecified chronic kidney disease: Secondary | ICD-10-CM | POA: Diagnosis not present

## 2019-08-04 DIAGNOSIS — E1122 Type 2 diabetes mellitus with diabetic chronic kidney disease: Secondary | ICD-10-CM | POA: Diagnosis not present

## 2019-08-04 DIAGNOSIS — J9621 Acute and chronic respiratory failure with hypoxia: Secondary | ICD-10-CM | POA: Diagnosis not present

## 2019-08-04 DIAGNOSIS — U071 COVID-19: Secondary | ICD-10-CM | POA: Diagnosis not present

## 2019-08-04 DIAGNOSIS — I509 Heart failure, unspecified: Secondary | ICD-10-CM | POA: Diagnosis not present

## 2019-08-04 NOTE — Telephone Encounter (Signed)
Reports SOB continuing after having covid in January. Requesting an appointment with cardiologist d/t recent medication changes at discharge from [facility]. Currently on O2 @3L /min. Reports being taken off cardiac medications during admission [facility] including entresto and aspirin. Wants to discuss this with [facility]. Denies chest pain or dizziness. Virtual appt given with Diona Browner 08/06/2019 @4 :00 pm and advised if SOB gets worse, to go to the ED for an evaluation. Verbalized understanding of plan.

## 2019-08-04 NOTE — Telephone Encounter (Signed)
Noted.  Please get records regarding stay at [facility].  If he did not have an echocardiogram during that hospitalization, will need to get one ordered prior to the virtual visit.

## 2019-08-05 ENCOUNTER — Other Ambulatory Visit: Payer: Self-pay | Admitting: Cardiology

## 2019-08-05 DIAGNOSIS — U071 COVID-19: Secondary | ICD-10-CM | POA: Diagnosis not present

## 2019-08-05 DIAGNOSIS — J9621 Acute and chronic respiratory failure with hypoxia: Secondary | ICD-10-CM | POA: Diagnosis not present

## 2019-08-05 DIAGNOSIS — I130 Hypertensive heart and chronic kidney disease with heart failure and stage 1 through stage 4 chronic kidney disease, or unspecified chronic kidney disease: Secondary | ICD-10-CM | POA: Diagnosis not present

## 2019-08-05 DIAGNOSIS — I509 Heart failure, unspecified: Secondary | ICD-10-CM | POA: Diagnosis not present

## 2019-08-05 DIAGNOSIS — E1122 Type 2 diabetes mellitus with diabetic chronic kidney disease: Secondary | ICD-10-CM | POA: Diagnosis not present

## 2019-08-05 DIAGNOSIS — I252 Old myocardial infarction: Secondary | ICD-10-CM | POA: Diagnosis not present

## 2019-08-06 ENCOUNTER — Telehealth: Payer: Medicare PPO | Admitting: Cardiology

## 2019-08-06 ENCOUNTER — Telehealth: Payer: Self-pay | Admitting: Cardiology

## 2019-08-06 NOTE — Telephone Encounter (Signed)
Patient would like to nurse to call and check on him.  He declined in office visit with R.Barrett in [place] and did not want to do another virtual

## 2019-08-06 NOTE — Telephone Encounter (Signed)
Advised patient that it would be of great benefit for him to do a virtual visit to discuss his medications that he was taken off of at recent admission [facility]). Patient agreed to virtual visit with Barrett on 08/08/2019 @1 :30 pm. Records faxed to [place] office.

## 2019-08-07 ENCOUNTER — Telehealth: Payer: Medicare PPO | Admitting: Cardiology

## 2019-08-07 NOTE — Progress Notes (Addendum)
Virtual Visit via Telephone Note   This visit type was conducted due to national recommendations for restrictions regarding the COVID-19 Pandemic (e.g. social distancing) in an effort to limit this patient's exposure and mitigate transmission in our community.  Due to his co-morbid illnesses, this patient is at least at moderate risk for complications without adequate follow up.  This format is felt to be most appropriate for this patient at this time.  The patient did not have access to video technology/had technical difficulties with video requiring transitioning to audio format only (telephone).  All issues noted in this document were discussed and addressed.  No physical exam could be performed with this format.  Please refer to the patient's chart for his  consent to telehealth for [name].  Evaluation Performed:  Follow-up visit  This visit type was conducted due to national recommendations for restrictions regarding the COVID-19 Pandemic (e.g. social distancing).  This format is felt to be most appropriate for this patient at this time.  All issues noted in this document were discussed and addressed.  No physical exam was performed (except for noted visual exam findings with Video Visits).  Please refer to the patient's chart (MyChart message for video visits and phone note for telephone visits) for the patient's consent to telehealth for [facility].  Date:  08/08/2019   ID:  Allen [name], DOB [birth year], MRN [id number]  Patient Location:  Home  Provider location:   Office  PCP:  Neale Burly, MD  Cardiologist:  Rozann Lesches, MD 02/05/2019 televisit A. Leonides Sake, NP, 05/16/2019 Electrophysiologist:  Thompson Grayer, MD   Chief Complaint: Shortness of breath  History of Present Illness:    Allen [name] is a 86 y.o. male who presents via audio/video conferencing for a telehealth visit today.    86 y.o. yo male who has a hx of CABG 2017 w/ LIMA-LAD, SVG-D1,  SVG-RCA, SVG-OM1-OM2, DM, HTN, HLD, LBBB, STJ ICD  He was hospitalized 01/05-01/18/2021 with COVID PNA, resp failure, S-D-CHF, AKI on CKD, COPD Gold 2.  He was given convalescent plasma, IV antibiotics, IV Lasix.  He required BiPAP and high flow O2.  Mr. Seckman is a little frustrated by the slowness of his recovery from the Covid.  He is still using oxygen, and still on a walker.  He is doing PT and hopes to get stronger soon.  He has some mild lower extremity edema, but denies orthopnea and PND.  He thinks he might be mostly getting it during the day, but may also have some in the morning.  He thinks he might have a home health RN, but has not met her yet.  He thinks he has some scales, but he thinks they are old and he does not think they are accurate.  In general, though he has lost weight.  He thinks he is down at least 15 pounds  He says he is compliant with medications.  His niece makes up his medications, and he takes them.  He has not had palpitations, and his device has not fired.   The patient does not have symptoms concerning for COVID-19 infection (fever, chills, cough, or new shortness of breath).    Prior CV studies:   The following studies were reviewed today:  ECHO: 11/20/2018 EF less than 20% on a 02/2018 echo 1. The left ventricle has moderate-severely reduced systolic function,  with an ejection fraction of 30-35%. The cavity size was mildly dilated.  There is mildly increased left ventricular wall thickness. Left  ventricular diastolic Doppler parameters are  consistent with impaired relaxation. Elevated mean left atrial pressure  Left ventricular diffuse hypokinesis.  2. Left atrial size was mild-moderately dilated.  3. Right atrial size was mildly dilated.  4. The aortic valve is tricuspid. Mild thickening of the aortic valve.  Mild calcification of the aortic valve. Aortic valve regurgitation is mild  to moderate by color flow Doppler. No stenosis of the  aortic valve. Mild  aortic annular calcification  noted.  5. The mitral valve is abnormal. Mild thickening of the mitral valve  leaflet. Mild calcification of the mitral valve leaflet. There is mild  mitral annular calcification present. No evidence of mitral valve  stenosis.  6. Mild pulmonic stenosis.  7. Pulmonary hypertension is indeterminant, inadequate TR.   Paceart remote device check 07/15/2019 Scheduled remote reviewed. Normal device function.   AF burden <1%, all <5 min. Frequent PMT episodes as seen in previous reports.   Past Medical History:  Diagnosis Date  . AICD (automatic cardioverter/defibrillator) present 07/17/2018   BIV  . CAD (coronary artery disease)    a. 04/2016: s/p CABG with LIMA-LAD, SVG-D1, SVG-RCA, and seq SVG-OM1-OM2  . Essential hypertension   . Hyperlipidemia   . Ischemic cardiomyopathy    LVEF 35% May 2016  . LBBB (left bundle branch block)   . Sinus bradycardia   . Type 2 diabetes mellitus ([place])    Past Surgical History:  Procedure Laterality Date  . BIV ICD INSERTION CRT-D  07/17/2018  . BIV ICD INSERTION CRT-D N/A 07/17/2018   Procedure: BIV ICD INSERTION CRT-D;  Surgeon: Thompson Grayer, MD;  Location: [place] CV LAB;  Service: Cardiovascular;  Laterality: N/A;  . CARDIAC CATHETERIZATION N/A 11/02/2014   Procedure: Left Heart Cath and Coronary Angiography;  Surgeon: Belva Crome, MD;  Location: [place] CV LAB;  Service: Cardiovascular;  Laterality: N/A;  . CARDIAC CATHETERIZATION N/A 05/03/2016   Procedure: Left Heart Cath and Coronary Angiography;  Surgeon: Peter M Martinique, MD;  Location: [place] CV LAB;  Service: Cardiovascular;  Laterality: N/A;  . CORONARY ARTERY BYPASS GRAFT N/A 05/08/2016   Procedure: CORONARY ARTERY BYPASS GRAFTING (CABG) x 5;  Surgeon: Gaye Pollack, MD;  Location: [place] OR;  Service: Open Heart Surgery;  Laterality: N/A;  . ENDOVEIN HARVEST OF GREATER SAPHENOUS VEIN  05/08/2016   Procedure: ENDOVEIN HARVEST  OF GREATER SAPHENOUS VEIN;  Surgeon: Gaye Pollack, MD;  Location: [place] OR;  Service: Open Heart Surgery;;  . TEE WITHOUT CARDIOVERSION N/A 05/08/2016   Procedure: TRANSESOPHAGEAL ECHOCARDIOGRAM (TEE);  Surgeon: Gaye Pollack, MD;  Location: [place];  Service: Open Heart Surgery;  Laterality: N/A;     Current Meds  Medication Sig  . amLODipine (NORVASC) 5 MG tablet Take 5 mg by mouth daily.   Marland Kitchen aspirin 81 MG EC tablet Take 81 mg by mouth daily.  Marland Kitchen atorvastatin (LIPITOR) 80 MG tablet Take 1 tablet (80 mg total) by mouth daily at 6 PM.  . carvedilol (COREG) 3.125 MG tablet TAKE 1 TABLET  TWO  TIMES DAILY.  Marland Kitchen clopidogrel (PLAVIX) 75 MG tablet TAKE 1 TABLET ONE TIME DAILY WITH BREAKFAST  . furosemide (LASIX) 20 MG tablet Take 20 mg by mouth daily as needed. Weight gain of 2-3 lbs in a 24 hour period or 5 lbs in one week  . glimepiride (AMARYL) 2 MG tablet Take 2 mg by mouth 2 (two) times daily.  Marland Kitchen  Insulin Glargine (LANTUS SOLOSTAR) 100 UNIT/ML Solostar Pen Inject 20 Units into the skin at bedtime.  . nitroGLYCERIN (NITROSTAT) 0.4 MG SL tablet Place 1 tablet (0.4 mg total) under the tongue every 5 (five) minutes x 3 doses as needed for chest pain (if no relief after 3rd dose, proceed to the ED for an evaluation).  . sacubitril-valsartan (ENTRESTO) 24-26 MG Take 1 tablet by mouth 2 (two) times daily. Start this 48 hours after stopping lisinopril-start taking this on Friday 04/05/18  . spironolactone (ALDACTONE) 25 MG tablet Take 0.5 tablets (12.5 mg total) by mouth daily.     Allergies:   Patient has no known allergies.   Social History   Tobacco Use  . Smoking status: Former Smoker    Packs/day: 0.50    Years: 56.00    Pack years: 28.00    Types: Cigarettes    Start date: 02/18/1956    Quit date: 06/13/2011    Years since quitting: 8.1  . Smokeless tobacco: Never Used  Substance Use Topics  . Alcohol use: No    Alcohol/week: 0.0 standard drinks  . Drug use: No     Family Hx: The  patient's family history includes Coronary artery disease (age of onset: 23) in his brother; Unexplained death in his father and mother.  ROS:   Please see the history of present illness.    All other systems reviewed and are negative.   Labs/Other Tests and Data Reviewed:    Recent Labs: No results found for requested labs within last 8760 hours.   CBC    Component Value Date/Time   WBC 7.0 06/28/2018 1154   WBC 5.1 02/18/2017 0639   RBC 5.31 06/28/2018 1154   RBC 4.35 02/18/2017 0639   HGB 14.8 06/28/2018 1154   HCT 45.1 06/28/2018 1154   PLT 268 06/28/2018 1154   MCV 85 06/28/2018 1154   MCH 27.9 06/28/2018 1154   MCH 26.7 02/18/2017 0639   MCHC 32.8 06/28/2018 1154   MCHC 31.4 02/18/2017 0639   RDW 13.7 06/28/2018 1154   LYMPHSABS 4.3 (H) 06/28/2018 1154   MONOABS 0.5 11/01/2014 2118   EOSABS 0.1 06/28/2018 1154   BASOSABS 0.0 06/28/2018 1154   Creatinine, Serum 2.640 MG/ 07/08/2019 CMP Latest Ref Rng & Units 07/18/2018 06/28/2018 02/18/2017  Glucose 70 - 99 mg/dL 126(H) 98 101(H)  BUN 8 - 23 mg/dL 18 17 14   Creatinine 0.61 - 1.24 mg/dL 1.34(H) 1.43(H) 1.19  Sodium 135 - 145 mmol/L 141 144 143  Potassium 3.5 - 5.1 mmol/L 4.3 4.5 4.1  Chloride 98 - 111 mmol/L 108 103 109  CO2 22 - 32 mmol/L 25 23 27   Calcium 8.9 - 10.3 mg/dL 9.7 11.3(H) 10.5(H)  Total Protein 6.5 - 8.1 g/dL - - 6.9  Total Bilirubin 0.3 - 1.2 mg/dL - - 0.9  Alkaline Phos 38 - 126 U/L - - 59  AST 15 - 41 U/L - - 33  ALT 17 - 63 U/L - - 20     Recent Lipid Panel Lab Results  Component Value Date/Time   CHOL 170 11/02/2014 02:13 AM   TRIG 121 11/02/2014 02:13 AM   HDL 45 11/02/2014 02:13 AM   CHOLHDL 3.8 11/02/2014 02:13 AM   LDLCALC 101 (H) 11/02/2014 02:13 AM    Wt Readings from Last 3 Encounters:  05/16/19 184 lb (83.5 kg)  02/05/19 182 lb (82.6 kg)  08/23/18 181 lb 12.8 oz (82.5 kg)     Objective:  Vital Signs:  There were no vitals taken for this visit.   86 y.o. male in no acute  distress over the phone.   ASSESSMENT & PLAN:    1.  Chronic combined systolic and diastolic CHF -By his description of his symptoms, his volume status is essentially at baseline. -He may have gained a little bit of fluid since discharge from the hospital, but how much is not clear -Requested he take an extra 20 mg of Lasix for 3 days.  His niece doses his medications, and we will mail out an AVS with instructions -Check a BMET next week after he has been on the extra Lasix to make sure that he is tolerating this well. - We have requested that he be sent a set of scales  2.  Recent Covid infection -By the patient's description, he was quite ill during his hospital stay. -According to Dr. Joselyn Arrow discharge summary, he required convalescent plasma and IV antibiotics in addition to BiPAP and IV Lasix.  3.  Acute on chronic CKD -According to records from [facility] ER, BUN was 69 creatinine 1.83 at discharge -According to the patient, he has had labs at Dr. Joselyn Arrow office since then, we will request these -If the records in [facility] are correct, his creatinine was 2.64 on 1/26 -Monitor renal function with additional diuresis  4.  History of [place] ICD -He is compliant with remote device checks -He has had no palpitations and his device has not fired    COVID-19 Education: The signs and symptoms of COVID-19 were discussed with the patient and how to seek care for testing (follow up with PCP or arrange E-visit).  The importance of social distancing was discussed today.  Patient Risk:   After full review of this patient's clinical status, I feel that they are at least moderate risk at this time.  Time:   Today, I have spent 18 minutes with the patient with telehealth technology discussing cardiac and other health issues.     Medication Adjustments/Labs and Tests Ordered: Current medicines are reviewed at length with the patient today.  Concerns regarding medicines are outlined above.   Tests Ordered: Orders Placed This Encounter  Procedures  . Basic Metabolic Panel (BMET)   Medication Changes: No orders of the defined types were placed in this encounter.   Disposition:  Follow up with Rozann Lesches, MD   Signed, Rosaria Ferries, PA-C  08/08/2019 4:39 PM    [place]

## 2019-08-08 ENCOUNTER — Telehealth (INDEPENDENT_AMBULATORY_CARE_PROVIDER_SITE_OTHER): Payer: Medicare PPO | Admitting: Physician Assistant

## 2019-08-08 ENCOUNTER — Encounter: Payer: Self-pay | Admitting: Physician Assistant

## 2019-08-08 DIAGNOSIS — I2510 Atherosclerotic heart disease of native coronary artery without angina pectoris: Secondary | ICD-10-CM | POA: Diagnosis not present

## 2019-08-08 DIAGNOSIS — Z9981 Dependence on supplemental oxygen: Secondary | ICD-10-CM | POA: Diagnosis not present

## 2019-08-08 DIAGNOSIS — N1832 Chronic kidney disease, stage 3b: Secondary | ICD-10-CM | POA: Diagnosis not present

## 2019-08-08 DIAGNOSIS — U071 COVID-19: Secondary | ICD-10-CM | POA: Diagnosis not present

## 2019-08-08 DIAGNOSIS — Z8616 Personal history of COVID-19: Secondary | ICD-10-CM

## 2019-08-08 DIAGNOSIS — Z95810 Presence of automatic (implantable) cardiac defibrillator: Secondary | ICD-10-CM

## 2019-08-08 DIAGNOSIS — G4700 Insomnia, unspecified: Secondary | ICD-10-CM | POA: Diagnosis not present

## 2019-08-08 DIAGNOSIS — J9610 Chronic respiratory failure, unspecified whether with hypoxia or hypercapnia: Secondary | ICD-10-CM | POA: Diagnosis not present

## 2019-08-08 DIAGNOSIS — J449 Chronic obstructive pulmonary disease, unspecified: Secondary | ICD-10-CM | POA: Diagnosis not present

## 2019-08-08 DIAGNOSIS — N179 Acute kidney failure, unspecified: Secondary | ICD-10-CM | POA: Diagnosis not present

## 2019-08-08 DIAGNOSIS — I252 Old myocardial infarction: Secondary | ICD-10-CM | POA: Diagnosis not present

## 2019-08-08 DIAGNOSIS — N184 Chronic kidney disease, stage 4 (severe): Secondary | ICD-10-CM | POA: Diagnosis not present

## 2019-08-08 DIAGNOSIS — I129 Hypertensive chronic kidney disease with stage 1 through stage 4 chronic kidney disease, or unspecified chronic kidney disease: Secondary | ICD-10-CM | POA: Diagnosis not present

## 2019-08-08 DIAGNOSIS — E1122 Type 2 diabetes mellitus with diabetic chronic kidney disease: Secondary | ICD-10-CM | POA: Diagnosis not present

## 2019-08-08 DIAGNOSIS — I5042 Chronic combined systolic (congestive) and diastolic (congestive) heart failure: Secondary | ICD-10-CM | POA: Diagnosis not present

## 2019-08-08 DIAGNOSIS — E785 Hyperlipidemia, unspecified: Secondary | ICD-10-CM | POA: Diagnosis not present

## 2019-08-08 NOTE — Patient Instructions (Signed)
Medication Instructions:  Take Lasix 20 mg daily for the next 3 days. THEN,  go back to your regular dose which is as needed for weight gain over 2-3 pounds in 24 hours, or 5 pounds in 1 week.  Labwork:  BMET lab work NEXT week  Procedures/Testing:  None today  Follow-Up:  In office visit with Dr.McDowell in 3 weeks.  Any Additional Special Instructions Will Be Listed Below (If Applicable).  I have ordered scales for you to weigh yourself.This is free of charge and will be delivered to your house.   If you need a refill on your cardiac medications before your next appointment, please call your pharmacy.

## 2019-08-11 ENCOUNTER — Telehealth: Payer: Self-pay | Admitting: Licensed Clinical Social Worker

## 2019-08-11 ENCOUNTER — Other Ambulatory Visit (HOSPITAL_COMMUNITY)
Admission: RE | Admit: 2019-08-11 | Discharge: 2019-08-11 | Disposition: A | Discharge status: Home / Self Care | Payer: Medicare PPO | Source: Ambulatory Visit | Attending: Physician Assistant | Admitting: Physician Assistant

## 2019-08-11 DIAGNOSIS — N1832 Chronic kidney disease, stage 3b: Secondary | ICD-10-CM | POA: Insufficient documentation

## 2019-08-11 DIAGNOSIS — U071 COVID-19: Secondary | ICD-10-CM | POA: Diagnosis not present

## 2019-08-11 DIAGNOSIS — N179 Acute kidney failure, unspecified: Secondary | ICD-10-CM | POA: Diagnosis not present

## 2019-08-11 LAB — BASIC METABOLIC PANEL
Anion gap: 8 (ref 5–15)
BUN: 11 mg/dL (ref 8–23)
CO2: 30 mmol/L (ref 22–32)
Calcium: 9.4 mg/dL (ref 8.9–10.3)
Chloride: 105 mmol/L (ref 98–111)
Creatinine, Ser: 1.11 mg/dL (ref 0.61–1.24)
GFR calc Af Amer: >60 mL/min (ref >60)
GFR calc non Af Amer: >60 mL/min (ref >60)
Glucose, Bld: 97 mg/dL (ref 70–99)
Potassium: 3.8 mmol/L (ref 3.5–5.1)
Sodium: 143 mmol/L (ref 135–145)

## 2019-08-11 NOTE — Telephone Encounter (Signed)
CSW referred to assist patient with obtaining a scale. CSW contacted patient to inform scale will be delivered to home. Patient grateful for support and assistance. CSW available as needed. Jackie Vidyuth Belsito, LCSW, CCSW-MCS [phone]  

## 2019-08-15 DIAGNOSIS — E1122 Type 2 diabetes mellitus with diabetic chronic kidney disease: Secondary | ICD-10-CM | POA: Diagnosis not present

## 2019-08-15 DIAGNOSIS — I252 Old myocardial infarction: Secondary | ICD-10-CM | POA: Diagnosis not present

## 2019-08-15 DIAGNOSIS — J9621 Acute and chronic respiratory failure with hypoxia: Secondary | ICD-10-CM | POA: Diagnosis not present

## 2019-08-15 DIAGNOSIS — U071 COVID-19: Secondary | ICD-10-CM | POA: Diagnosis not present

## 2019-08-15 DIAGNOSIS — I130 Hypertensive heart and chronic kidney disease with heart failure and stage 1 through stage 4 chronic kidney disease, or unspecified chronic kidney disease: Secondary | ICD-10-CM | POA: Diagnosis not present

## 2019-08-15 DIAGNOSIS — I509 Heart failure, unspecified: Secondary | ICD-10-CM | POA: Diagnosis not present

## 2019-08-21 DIAGNOSIS — N1831 Chronic kidney disease, stage 3a: Secondary | ICD-10-CM | POA: Diagnosis not present

## 2019-08-21 DIAGNOSIS — K219 Gastro-esophageal reflux disease without esophagitis: Secondary | ICD-10-CM | POA: Diagnosis not present

## 2019-08-21 DIAGNOSIS — I5022 Chronic systolic (congestive) heart failure: Secondary | ICD-10-CM | POA: Diagnosis not present

## 2019-08-21 DIAGNOSIS — I10 Essential (primary) hypertension: Secondary | ICD-10-CM | POA: Diagnosis not present

## 2019-08-21 DIAGNOSIS — E1121 Type 2 diabetes mellitus with diabetic nephropathy: Secondary | ICD-10-CM | POA: Diagnosis not present

## 2019-08-26 ENCOUNTER — Telehealth: Payer: Medicare PPO | Admitting: Cardiology

## 2019-08-27 DIAGNOSIS — I130 Hypertensive heart and chronic kidney disease with heart failure and stage 1 through stage 4 chronic kidney disease, or unspecified chronic kidney disease: Secondary | ICD-10-CM | POA: Diagnosis not present

## 2019-08-27 DIAGNOSIS — I509 Heart failure, unspecified: Secondary | ICD-10-CM | POA: Diagnosis not present

## 2019-08-27 DIAGNOSIS — U071 COVID-19: Secondary | ICD-10-CM | POA: Diagnosis not present

## 2019-08-27 DIAGNOSIS — I252 Old myocardial infarction: Secondary | ICD-10-CM | POA: Diagnosis not present

## 2019-08-27 DIAGNOSIS — J9621 Acute and chronic respiratory failure with hypoxia: Secondary | ICD-10-CM | POA: Diagnosis not present

## 2019-08-27 DIAGNOSIS — E1122 Type 2 diabetes mellitus with diabetic chronic kidney disease: Secondary | ICD-10-CM | POA: Diagnosis not present

## 2019-09-02 ENCOUNTER — Other Ambulatory Visit: Payer: Self-pay

## 2019-09-02 ENCOUNTER — Encounter: Payer: Self-pay | Admitting: Cardiology

## 2019-09-02 ENCOUNTER — Ambulatory Visit: Payer: Medicare PPO | Admitting: Cardiology

## 2019-09-02 VITALS — BP 144/76 | HR 72 | Temp 98.7°F | Ht 62.0 in | Wt 170.0 lb

## 2019-09-02 DIAGNOSIS — I25119 Atherosclerotic heart disease of native coronary artery with unspecified angina pectoris: Secondary | ICD-10-CM | POA: Diagnosis not present

## 2019-09-02 DIAGNOSIS — J1282 Pneumonia due to coronavirus disease 2019: Secondary | ICD-10-CM

## 2019-09-02 DIAGNOSIS — U071 COVID-19: Secondary | ICD-10-CM | POA: Diagnosis not present

## 2019-09-02 DIAGNOSIS — I255 Ischemic cardiomyopathy: Secondary | ICD-10-CM | POA: Diagnosis not present

## 2019-09-02 DIAGNOSIS — J9601 Acute respiratory failure with hypoxia: Secondary | ICD-10-CM

## 2019-09-02 NOTE — Progress Notes (Signed)
Cardiology Office Note  Date: 09/02/2019   ID: CUONG MOORMAN, DOB Dec 23, 1933, MRN [id number]  PCP:  [name], MD  Cardiologist:  [name], MD Electrophysiologist:  Hillis Range, MD   Chief Complaint  Patient presents with  . Cardiac follow-up    History of Present Illness: Allen [name] is an 86 y.o. male assessed recently via telehealth encounter by Ms. Barrett PA-C in February.  He had been hospitalized in January with COVID-19 pneumonia associated with hypoxic respiratory failure, heart failure exacerbation, and acute on chronic renal insufficiency.  He is here today with a family member for follow-up.  Still has not gotten back to baseline, continues on supplemental oxygen, using a walker.  He has some sinus drainage and intermittent congestion.  No fevers or chills.  His weight is down in comparison to December 2020.  He follows with Dr. Johney Frame in the device clinic, St. Jude biventricular ICD in place.  No device shocks.  Last device interrogation in February indicated normal function.  I reviewed his recent lab work as outlined below.  Renal function has improved, creatinine 1.11 and potassium 3.8.  I reviewed his current medications which are different from our last encounter.  He is no longer on Entresto or Aldactone.  Last echocardiogram was in June 2020 as outlined below.  Past Medical History:  Diagnosis Date  . AICD (automatic cardioverter/defibrillator) present 07/17/2018   BIV  . CAD (coronary artery disease)    a. 04/2016: s/p CABG with LIMA-LAD, SVG-D1, SVG-RCA, and seq SVG-OM1-OM2  . Essential hypertension   . Hyperlipidemia   . Ischemic cardiomyopathy    LVEF 35% May 2016  . LBBB (left bundle branch block)   . Pneumonia due to COVID-19 virus   . Sinus bradycardia   . Type 2 diabetes mellitus ([facility])     Past Surgical History:  Procedure Laterality Date  . BIV ICD INSERTION CRT-D  07/17/2018  . BIV ICD INSERTION CRT-D N/A 07/17/2018   Procedure: BIV ICD INSERTION CRT-D;  Surgeon: Hillis Range, MD;  Location: [facility] INVASIVE CV LAB;  Service: Cardiovascular;  Laterality: N/A;  . CARDIAC CATHETERIZATION N/A 11/02/2014   Procedure: Left Heart Cath and Coronary Angiography;  Surgeon: Lyn Records, MD;  Location: [facility] INVASIVE CV LAB;  Service: Cardiovascular;  Laterality: N/A;  . CARDIAC CATHETERIZATION N/A 05/03/2016   Procedure: Left Heart Cath and Coronary Angiography;  Surgeon: Peter M Swaziland, MD;  Location: [facility] INVASIVE CV LAB;  Service: Cardiovascular;  Laterality: N/A;  . CORONARY ARTERY BYPASS GRAFT N/A 05/08/2016   Procedure: CORONARY ARTERY BYPASS GRAFTING (CABG) x 5;  Surgeon: Alleen Borne, MD;  Location: [facility] OR;  Service: Open Heart Surgery;  Laterality: N/A;  . ENDOVEIN HARVEST OF GREATER SAPHENOUS VEIN  05/08/2016   Procedure: ENDOVEIN HARVEST OF GREATER SAPHENOUS VEIN;  Surgeon: Alleen Borne, MD;  Location: [facility] OR;  Service: Open Heart Surgery;;  . TEE WITHOUT CARDIOVERSION N/A 05/08/2016   Procedure: TRANSESOPHAGEAL ECHOCARDIOGRAM (TEE);  Surgeon: Alleen Borne, MD;  Location: [facility] OR;  Service: Open Heart Surgery;  Laterality: N/A;    Current Outpatient Medications  Medication Sig Dispense Refill  . amLODipine (NORVASC) 5 MG tablet Take 5 mg by mouth daily.     Marland Kitchen aspirin 81 MG EC tablet Take 81 mg by mouth daily.    Marland Kitchen atorvastatin (LIPITOR) 80 MG tablet Take 1 tablet (80 mg total) by mouth daily at 6 PM. 90 tablet 3  . carvedilol (COREG)  3.125 MG tablet TAKE 1 TABLET  TWO  TIMES DAILY. 180 tablet 2  . clopidogrel (PLAVIX) 75 MG tablet TAKE 1 TABLET ONE TIME DAILY WITH BREAKFAST 90 tablet 3  . furosemide (LASIX) 20 MG tablet Take 20 mg by mouth daily as needed. Weight gain of 2-3 lbs in a 24 hour period or 5 lbs in one week    . Insulin Glargine (LANTUS SOLOSTAR) 100 UNIT/ML Solostar Pen Inject 20 Units into the skin at bedtime. 15 mL 11  . Insulin Lispro Junior KwikPen 100 UNIT/ML SOPN     . mirtazapine (REMERON) 15  MG tablet Take 15 mg by mouth daily at 12 noon.     . nitroGLYCERIN (NITROSTAT) 0.4 MG SL tablet Place 1 tablet (0.4 mg total) under the tongue every 5 (five) minutes x 3 doses as needed for chest pain (if no relief after 3rd dose, proceed to the ED for an evaluation). 75 tablet 1  . repaglinide (PRANDIN) 2 MG tablet Take 2 mg by mouth 2 (two) times daily before a meal.     . vitamin B-12 (CYANOCOBALAMIN) 1000 MCG tablet      No current facility-administered medications for this visit.   Allergies:  Patient has no known allergies.   ROS:   Fatigue.  Physical Exam: VS:  BP (!) 144/76   Pulse 72   Temp 98.7 F (37.1 C)   Ht 5\' 2"  (1.575 m)   Wt 170 lb (77.1 kg)   SpO2 97%   BMI 31.09 kg/m , BMI Body mass index is 31.09 kg/m.  Wt Readings from Last 3 Encounters:  09/02/19 170 lb (77.1 kg)  05/16/19 184 lb (83.5 kg)  02/05/19 182 lb (82.6 kg)    General: Elderly male, appears comfortable at rest.  Wearing supplemental oxygen. HEENT: Conjunctiva and lids normal, wearing a mask. Neck: Supple, no elevated JVP or carotid bruits, no thyromegaly. Lungs: Scattered rhonchi, no wheezing, nonlabored breathing at rest. Cardiac: Regular rate and rhythm, no S3, soft systolic murmur. Abdomen: Soft, nontender, bowel sounds present. Extremities: Trace ankle edema, distal pulses 2+. Skin: Warm and dry. Musculoskeletal: No kyphosis. Neuropsychiatric: Alert and oriented x3, affect grossly appropriate.  ECG:  An ECG dated 06/17/2019 was personally reviewed today and demonstrated:  Sinus rhythm with intermittent ventricular pacing and PVCs, repolarization abnormalities.  Recent Labwork: 08/11/2019: BUN 11; Creatinine, Ser 1.11; Potassium 3.8; Sodium 143   Other Studies Reviewed Today:  Echocardiogram 11/20/2018: 1. The left ventricle has moderate-severely reduced systolic function,  with an ejection fraction of 30-35%. The cavity size was mildly dilated.  There is mildly increased left  ventricular wall thickness. Left  ventricular diastolic Doppler parameters are  consistent with impaired relaxation. Elevated mean left atrial pressure  Left ventricular diffuse hypokinesis.  2. Left atrial size was mild-moderately dilated.  3. Right atrial size was mildly dilated.  4. The aortic valve is tricuspid. Mild thickening of the aortic valve.  Mild calcification of the aortic valve. Aortic valve regurgitation is mild  to moderate by color flow Doppler. No stenosis of the aortic valve. Mild  aortic annular calcification  noted.  5. The mitral valve is abnormal. Mild thickening of the mitral valve  leaflet. Mild calcification of the mitral valve leaflet. There is mild  mitral annular calcification present. No evidence of mitral valve  stenosis.  6. Mild pulmonic stenosis.  7. Pulmonary hypertension is indeterminant, inadequate TR.   Assessment and Plan:  1.  Ischemic cardiomyopathy, last LVEF 30 to 35%  as of June 2020.  Medications have been modified since our last encounter in light of interval hospitalization with COVID-19 pneumonia and acute renal insufficiency.  Plan is to obtain a follow-up echocardiogram for new baseline.  Recent lab work shows creatinine 1.1 and normal potassium.  We may be able to start him back on Entresto as well as Aldactone, could reduce Norvasc depending on blood pressure response.  He otherwise remains on aspirin and Plavix without active angina symptoms, also statin therapy which he is tolerating.  2.  COVID-19 pneumonia with hypoxic respiratory failure, hospitalized in January, and still slowly recovering.  He remains on supplemental oxygen.  Referral made for pulmonary consultation.  3.  St. Jude biventricular ICD in place, no recent device discharges or syncope.  He continues to follow with Dr. Rayann Heman.  Medication Adjustments/Labs and Tests Ordered: Current medicines are reviewed at length with the patient today.  Concerns regarding  medicines are outlined above.   Tests Ordered: Orders Placed This Encounter  Procedures  . Ambulatory referral to Pulmonology  . ECHOCARDIOGRAM COMPLETE    Medication Changes: No orders of the defined types were placed in this encounter.   Disposition:  Follow up 3 months in the [place] office.  Signed, Satira Sark, MD, [facility] 09/02/2019 2:20 PM    [place] at [facility] [address], [place], [place] [address] Phone: [phone]; Fax: [phone]

## 2019-09-02 NOTE — Patient Instructions (Signed)
Medication Instructions:  Your physician recommends that you continue on your current medications as directed. Please refer to the Current Medication list given to you today.  *If you need a refill on your cardiac medications before your next appointment, please call your pharmacy*   Lab Work: None today If you have labs (blood work) drawn today and your tests are completely normal, you will receive your results only by: Marland Kitchen MyChart Message (if you have MyChart) OR . A paper copy in the mail If you have any lab test that is abnormal or we need to change your treatment, we will call you to review the results.   Testing/Procedures: Your physician has requested that you have an echocardiogram in the [facility] office. Echocardiography is a painless test that uses sound waves to create images of your heart. It provides your doctor with information about the size and shape of your heart and how well your heart's chambers and valves are working. This procedure takes approximately one hour. There are no restrictions for this procedure.      Follow-Up: At [facility], you and your health needs are our priority.  As part of our continuing mission to provide you with exceptional heart care, we have created designated Provider Care Teams.  These Care Teams include your primary Cardiologist (physician) and Advanced Practice Providers (APPs -  Physician Assistants and Nurse Practitioners) who all work together to provide you with the care you need, when you need it.  We recommend signing up for the patient portal called "MyChart".  Sign up information is provided on this After Visit Summary.  MyChart is used to connect with patients for Virtual Visits (Telemedicine).  Patients are able to view lab/test results, encounter notes, upcoming appointments, etc.  Non-urgent messages can be sent to your provider as well.   To learn more about what you can do with MyChart, go to [email or web address].    Your next  appointment:   3 month(s)  The format for your next appointment:   In Person  Provider:   [name], MD   Other Instructions We have placed a referral to [facility]mon[facility].They will call you to schedule an apt.       Thank you for choosing [facility] !

## 2019-09-05 DIAGNOSIS — U071 COVID-19: Secondary | ICD-10-CM | POA: Diagnosis not present

## 2019-09-11 DIAGNOSIS — U071 COVID-19: Secondary | ICD-10-CM | POA: Diagnosis not present

## 2019-09-25 ENCOUNTER — Ambulatory Visit (INDEPENDENT_AMBULATORY_CARE_PROVIDER_SITE_OTHER): Payer: Medicare PPO

## 2019-09-25 ENCOUNTER — Other Ambulatory Visit: Payer: Self-pay

## 2019-09-25 DIAGNOSIS — I255 Ischemic cardiomyopathy: Secondary | ICD-10-CM

## 2019-09-26 ENCOUNTER — Telehealth (INDEPENDENT_AMBULATORY_CARE_PROVIDER_SITE_OTHER): Payer: Medicare PPO | Admitting: Internal Medicine

## 2019-09-26 ENCOUNTER — Telehealth: Payer: Self-pay | Admitting: *Deleted

## 2019-09-26 DIAGNOSIS — I25119 Atherosclerotic heart disease of native coronary artery with unspecified angina pectoris: Secondary | ICD-10-CM

## 2019-09-26 DIAGNOSIS — Z95810 Presence of automatic (implantable) cardiac defibrillator: Secondary | ICD-10-CM | POA: Diagnosis not present

## 2019-09-26 DIAGNOSIS — I10 Essential (primary) hypertension: Secondary | ICD-10-CM

## 2019-09-26 DIAGNOSIS — J9601 Acute respiratory failure with hypoxia: Secondary | ICD-10-CM | POA: Diagnosis not present

## 2019-09-26 DIAGNOSIS — I5042 Chronic combined systolic (congestive) and diastolic (congestive) heart failure: Secondary | ICD-10-CM

## 2019-09-26 DIAGNOSIS — I255 Ischemic cardiomyopathy: Secondary | ICD-10-CM

## 2019-09-26 MED ORDER — ENTRESTO 24-26 MG PO TABS: 1.0000 | ORAL_TABLET | Freq: Two times a day (BID) | ORAL | 6 refills | Status: DC

## 2019-09-26 NOTE — Telephone Encounter (Signed)
Niece, Elenora Fender informed and says patient has entresto 24/26 mg on hand. Lab order faxed to [facility]. Per Jasmine December, (who helps patient with his medication), patient was taken off of amlodipine, aspirin, spironolactone and entresto during hospital admission in January 2021. Per Jasmine December, patient has not been taking amlodipine or aspirin although its listed on medication profile. Advised that message would be sent to provider for instructions.

## 2019-09-26 NOTE — Progress Notes (Signed)
Electrophysiology TeleHealth Note  Due to national recommendations of social distancing due to [place] 19, an audio telehealth visit is felt to be most appropriate for this patient at this time.  Verbal consent was obtained by me for the telehealth visit today.  The patient does not have capability for a virtual visit.  A phone visit is therefore required today.   Date:  09/26/2019   ID:  [name], DOB Jan 30, 1934, MRN [id number]  Location: patient's home  Provider location:  [place]  Evaluation Performed: Follow-up visit  PCP:  Neale Burly, MD   Electrophysiologist:  Dr Rayann Heman  Chief Complaint:  SOB  History of Present Illness:    JAVARIUS TSOSIE is a 86 y.o. male who presents via telehealth conferencing today.  Since last being seen in our clinic, the patient reports doing reasonably well.  Today, he denies symptoms of palpitations, chest pain,  lower extremity edema, dizziness, presyncope, or syncope.  He has SOB and is on O2.  This has worsened since covid 19 infection in January. The patient is otherwise without complaint today.   Past Medical History:  Diagnosis Date  . AICD (automatic cardioverter/defibrillator) present 07/17/2018   BIV  . CAD (coronary artery disease)    a. 04/2016: s/p CABG with LIMA-LAD, SVG-D1, SVG-RCA, and seq SVG-OM1-OM2  . Essential hypertension   . Hyperlipidemia   . Ischemic cardiomyopathy    LVEF 35% May 2016  . LBBB (left bundle branch block)   . Pneumonia due to COVID-19 virus   . Sinus bradycardia   . Type 2 diabetes mellitus ([place])     Past Surgical History:  Procedure Laterality Date  . BIV ICD INSERTION CRT-D  07/17/2018  . BIV ICD INSERTION CRT-D N/A 07/17/2018   Procedure: BIV ICD INSERTION CRT-D;  Surgeon: Thompson Grayer, MD;  Location: [place] [facility];  Service: Cardiovascular;  Laterality: N/A;  . CARDIAC CATHETERIZATION N/A 11/02/2014   Procedure: Left Heart Cath and Coronary Angiography;  Surgeon: Belva Crome, MD;  Location: [place] [facility];  Service: Cardiovascular;  Laterality: N/A;  . CARDIAC CATHETERIZATION N/A 05/03/2016   Procedure: Left Heart Cath and Coronary Angiography;  Surgeon: Peter M Martinique, MD;  Location: [place] [facility];  Service: Cardiovascular;  Laterality: N/A;  . CORONARY ARTERY BYPASS GRAFT N/A 05/08/2016   Procedure: CORONARY ARTERY BYPASS GRAFTING (CABG) x 5;  Surgeon: Gaye Pollack, MD;  Location: [place] [facility];  Service: Open Heart Surgery;  Laterality: N/A;  . ENDOVEIN HARVEST OF GREATER SAPHENOUS VEIN  05/08/2016   Procedure: ENDOVEIN HARVEST OF GREATER SAPHENOUS VEIN;  Surgeon: Gaye Pollack, MD;  Location: [place] [facility];  Service: Open Heart Surgery;;  . TEE WITHOUT CARDIOVERSION N/A 05/08/2016   Procedure: TRANSESOPHAGEAL ECHOCARDIOGRAM (TEE);  Surgeon: Gaye Pollack, MD;  Location: [place];  Service: Open Heart Surgery;  Laterality: N/A;    Current Outpatient Medications  Medication Sig Dispense Refill  . amLODipine (NORVASC) 5 MG tablet Take 5 mg by mouth daily.     Marland Kitchen aspirin 81 MG EC tablet Take 81 mg by mouth daily.    Marland Kitchen atorvastatin (LIPITOR) 80 MG tablet Take 1 tablet (80 mg total) by mouth daily at 6 PM. 90 tablet 3  . carvedilol (COREG) 3.125 MG tablet TAKE 1 TABLET  TWO  TIMES DAILY. 180 tablet 2  . clopidogrel (PLAVIX) 75 MG tablet TAKE 1 TABLET ONE TIME DAILY WITH BREAKFAST 90 tablet 3  . furosemide (  LASIX) 20 MG tablet Take 20 mg by mouth daily as needed. Weight gain of 2-3 lbs in a 24 hour period or 5 lbs in one week    . Insulin Glargine (LANTUS SOLOSTAR) 100 UNIT/ML Solostar Pen Inject 20 Units into the skin at bedtime. 15 mL 11  . Insulin Lispro Junior KwikPen 100 UNIT/ML SOPN     . mirtazapine (REMERON) 15 MG tablet Take 15 mg by mouth daily at 12 noon.     . nitroGLYCERIN (NITROSTAT) 0.4 MG SL tablet Place 1 tablet (0.4 mg total) under the tongue every 5 (five) minutes x 3 doses as needed for chest pain (if no relief after 3rd dose, proceed to the ED for  an evaluation). 75 tablet 1  . repaglinide (PRANDIN) 2 MG tablet Take 2 mg by mouth 2 (two) times daily before a meal.     . vitamin B-12 (CYANOCOBALAMIN) 1000 MCG tablet      No current facility-administered medications for this visit.    Allergies:   Patient has no known allergies.   Social History:  The patient  reports that he quit smoking about 8 years ago. His smoking use included cigarettes. He started smoking about 63 years ago. He has a 28.00 pack-year smoking history. He has never used smokeless tobacco. He reports that he does not drink alcohol or use drugs.   Family History:  The patient's family history includes Coronary artery disease (age of onset: 68) in his brother; Unexplained death in his father and mother.   ROS:  Please see the history of present illness.   All other systems are personally reviewed and negative.    Exam:    Vital Signs:  There were no vitals taken for this visit.  Well sounding, alert and conversant   Labs/Other Tests and Data Reviewed:    Recent Labs: 08/11/2019: BUN 11; Creatinine, Ser 1.11; Potassium 3.8; Sodium 143   Wt Readings from Last 3 Encounters:  09/02/19 170 lb (77.1 kg)  05/16/19 184 lb (83.5 kg)  02/05/19 182 lb (82.6 kg)     Last device remote is reviewed from [employer] PDF which reveals normal device function, no arrhythmias    ASSESSMENT & PLAN:    1.  Sinus bradycardia,  Chronic systolic dysfunction EF 35% Normal device function He was scheduled to have echo optimization in September by me but did not have this done.  He has has worsening SOB and is now more willing to consider optimization.  I will have EP NP reach out to arrange.  2. CAD/ ischemic CM No ischemic symptoms Worsening SOB may be secondary to COVID 19 infection. Will optimize his CRT and then have primary care also evaluate pulmonary issues.  3. HTN Stable No change required today   Very complicated patient.  He is ill.  He is at risk for  decompensation/ worsening CHF  Follow-up: EP APP for CRT optimization   Patient Risk:  after full review of this patients clinical status, I feel that they are at high risk at this time.  Today, I have spent 25 minutes with the patient with telehealth technology discussing CHF management .    Randolm Idol, MD  09/26/2019 8:56 AM     [facility] [address] [place] [address] [phone] (office) [phone] (fax)

## 2019-09-26 NOTE — Telephone Encounter (Signed)
-----   Message from Jonelle Sidle, MD sent at 09/26/2019  8:07 AM EDT ----- Results reviewed.  LVEF approximately 35%, similar to previous assessment.  Also mild to moderate aortic stenosis which we will continue to follow.  Let's plan to start him back on Entresto 24/26 mg twice daily, recent creatinine 1.11.  He will need a follow-up BMET in 7 to 10 days.

## 2019-09-26 NOTE — Telephone Encounter (Signed)
Resume ASA but hold off on Norvasc for now.

## 2019-09-29 ENCOUNTER — Other Ambulatory Visit: Payer: Self-pay

## 2019-09-29 ENCOUNTER — Encounter: Payer: Self-pay | Admitting: Internal Medicine

## 2019-09-29 ENCOUNTER — Ambulatory Visit (INDEPENDENT_AMBULATORY_CARE_PROVIDER_SITE_OTHER): Payer: Medicare PPO | Admitting: Internal Medicine

## 2019-09-29 ENCOUNTER — Other Ambulatory Visit (HOSPITAL_COMMUNITY)
Admission: RE | Admit: 2019-09-29 | Discharge: 2019-09-29 | Disposition: A | Discharge status: Home / Self Care | Payer: Medicare PPO | Source: Ambulatory Visit | Attending: Internal Medicine | Admitting: Internal Medicine

## 2019-09-29 ENCOUNTER — Ambulatory Visit (HOSPITAL_COMMUNITY)
Admission: RE | Admit: 2019-09-29 | Discharge: 2019-09-29 | Disposition: A | Discharge status: Home / Self Care | Payer: Medicare PPO | Source: Ambulatory Visit | Attending: Internal Medicine | Admitting: Internal Medicine

## 2019-09-29 DIAGNOSIS — R0600 Dyspnea, unspecified: Secondary | ICD-10-CM

## 2019-09-29 DIAGNOSIS — J9611 Chronic respiratory failure with hypoxia: Secondary | ICD-10-CM | POA: Diagnosis not present

## 2019-09-29 DIAGNOSIS — R0609 Other forms of dyspnea: Secondary | ICD-10-CM

## 2019-09-29 DIAGNOSIS — I509 Heart failure, unspecified: Secondary | ICD-10-CM | POA: Diagnosis not present

## 2019-09-29 LAB — CBC WITH DIFFERENTIAL/PLATELET
Abs Immature Granulocytes: 0.01 10*3/uL (ref 0.00–0.07)
Basophils Absolute: 0.1 10*3/uL (ref 0.0–0.1)
Basophils Relative: 1 %
Eosinophils Absolute: 0.2 10*3/uL (ref 0.0–0.5)
Eosinophils Relative: 2 %
HCT: 45.7 % (ref 39.0–52.0)
Hemoglobin: 13.2 g/dL (ref 13.0–17.0)
Immature Granulocytes: 0 %
Lymphocytes Relative: 62 %
Lymphs Abs: 6.1 10*3/uL — ABNORMAL HIGH (ref 0.7–4.0)
MCH: 29.5 pg (ref 26.0–34.0)
MCHC: 28.9 g/dL — ABNORMAL LOW (ref 30.0–36.0)
MCV: 102 fL — ABNORMAL HIGH (ref 80.0–100.0)
Monocytes Absolute: 0.8 10*3/uL (ref 0.1–1.0)
Monocytes Relative: 8 %
Neutro Abs: 2.7 10*3/uL (ref 1.7–7.7)
Neutrophils Relative %: 27 %
Platelets: 276 10*3/uL (ref 150–400)
RBC: 4.48 MIL/uL (ref 4.22–5.81)
RDW: 15.2 % (ref 11.5–15.5)
WBC: 9.8 10*3/uL (ref 4.0–10.5)
nRBC: 0 % (ref 0.0–0.2)

## 2019-09-29 LAB — TSH: TSH: 2.837 u[IU]/mL (ref 0.350–4.500)

## 2019-09-29 LAB — SEDIMENTATION RATE: Sed Rate: 53 mm/hr — ABNORMAL HIGH (ref 0–16)

## 2019-09-29 LAB — D-DIMER, QUANTITATIVE: D-Dimer, Quant: 1.27 ug/mL-FEU — ABNORMAL HIGH (ref 0.00–0.50)

## 2019-09-29 LAB — BRAIN NATRIURETIC PEPTIDE: B Natriuretic Peptide: 1057 pg/mL — ABNORMAL HIGH (ref 0.0–100.0)

## 2019-09-29 NOTE — Addendum Note (Signed)
Addended by: Eustace Moore on: 09/29/2019 08:20 AM   Modules accepted: Orders

## 2019-09-29 NOTE — Progress Notes (Signed)
Allen [name], male    DOB: Sep 11, 1933, 86 y.o.   MRN: [id number]   Brief patient profile:  85 yobm quit smoking 2013 without apparent sequelae then onset covid 19 xmas 2020 and admitted Jan 2021 and breathing hasn't right since so referred to pulmonary clinic 09/29/2019 by Dr   Domenic Polite      History of Present Illness  09/29/2019  Pulmonary/ 1st office eval/Allen [name]  Chief Complaint  Patient presents with  . Pulmonary Consult    Referred by Dr Rozann Lesches. Pt c/o SOB since had covid Dec 2020.   Dyspnea:  Breathing about the same as at d/c from [facility] can walk flat for 3-4 min does not check while walking Cough: not much , thick mucus from nose  Sleep: bed is flat / 2 pillows  SABA use:  02  3 concentrator and 3lpm on portable system does not titrate   No obvious day to day or daytime variability or assoc excess/ purulent sputum or mucus plugs or hemoptysis or cp or chest tightness, subjective wheeze or overt sinus or hb symptoms.   Sleeping  without nocturnal  or early am exacerbation  of respiratory  c/o's or need for noct saba. Also denies any obvious fluctuation of symptoms with weather or environmental changes or other aggravating or alleviating factors except as outlined above   No unusual exposure hx or h/o childhood pna/ asthma or knowledge of premature birth.  Current Allergies, Complete Past Medical History, Past Surgical History, Family History, and Social History were reviewed in [employer] record.  ROS  The following are not active complaints unless bolded Hoarseness, sore throat, dysphagia, dental problems, itching, sneezing,  nasal congestion or discharge of excess mucus or purulent secretions, ear ache,   fever, chills, sweats, unintended wt loss or wt gain, classically pleuritic or exertional cp,  orthopnea pnd or arm/hand swelling  or leg swelling, presyncope, palpitations, abdominal pain, anorexia, nausea, vomiting, diarrhea   or change in bowel habits or change in bladder habits, change in stools or change in urine, dysuria, hematuria,  rash, arthralgias, visual complaints, headache, numbness, weakness or ataxia or problems with walking or coordination,  change in mood or  memory.            Past Medical History:  Diagnosis Date  . AICD (automatic cardioverter/defibrillator) present 07/17/2018   BIV  . CAD (coronary artery disease)    a. 04/2016: s/p CABG with LIMA-LAD, SVG-D1, SVG-RCA, and seq SVG-OM1-OM2  . Essential hypertension   . Hyperlipidemia   . Ischemic cardiomyopathy    LVEF 35% May 2016  . LBBB (left bundle branch block)   . Pneumonia due to COVID-19 virus   . Sinus bradycardia   . Type 2 diabetes mellitus ([place])     Outpatient Medications Prior to Visit  Medication Sig Dispense Refill  . aspirin 81 MG EC tablet Take 81 mg by mouth daily.    Marland Kitchen atorvastatin (LIPITOR) 80 MG tablet Take 1 tablet (80 mg total) by mouth daily at 6 PM. 90 tablet 3  . carvedilol (COREG) 3.125 MG tablet TAKE 1 TABLET  TWO  TIMES DAILY. 180 tablet 2  . cholecalciferol (VITAMIN D3) 25 MCG (1000 UNIT) tablet Take 1,000 Units by mouth daily.    . clopidogrel (PLAVIX) 75 MG tablet TAKE 1 TABLET ONE TIME DAILY WITH BREAKFAST 90 tablet 3  . furosemide (LASIX) 20 MG tablet Take 20 mg by mouth daily as needed. Weight gain of 2-3  lbs in a 24 hour period or 5 lbs in one week    . Insulin Glargine (LANTUS SOLOSTAR) 100 UNIT/ML Solostar Pen Inject 20 Units into the skin at bedtime. 15 mL 11  . Insulin Lispro Junior KwikPen 100 UNIT/ML SOPN     . mirtazapine (REMERON) 15 MG tablet Take 15 mg by mouth daily at 12 noon.     . OXYGEN 3 lpm 24/7 DME- [facility]    . repaglinide (PRANDIN) 2 MG tablet Take 2 mg by mouth 2 (two) times daily before a meal.     . sacubitril-valsartan (ENTRESTO) 24-26 MG Take 1 tablet by mouth 2 (two) times daily. 60 tablet 6  . vitamin B-12 (CYANOCOBALAMIN) 1000 MCG tablet     . nitroGLYCERIN (NITROSTAT)  0.4 MG SL tablet Place 1 tablet (0.4 mg total) under the tongue every 5 (five) minutes x 3 doses as needed for chest pain (if no relief after 3rd dose, proceed to the ED for an evaluation). 75 tablet 1      Objective:     BP 140/80 (BP Location: Left Arm, Cuff Size: Normal)   Pulse 60   Temp (!) 97.3 F (36.3 C) (Temporal)   Ht 5\' 6"  (1.676 m)   Wt 171 lb (77.6 kg)   SpO2 100% Comment: on 3lpm cont o2  BMI 27.60 kg/m   SpO2: 100 %(on 3lpm cont o2) O2 Type: Continuous O2 O2 Flow Rate (L/min): 3 L/min   Obese bm nad at rest / sats down to 80% at rest    HEENT : pt wearing mask not removed for exam due to covid -19 concerns.    NECK :  without JVD/Nodes/TM/ nl carotid upstrokes bilaterally   LUNGS: no acc muscle use,  Nl contour chest with distant bs  Bilaterally with minimal insp crackles  without cough on insp or exp maneuvers   CV:  RRR  no s3   III/VI SEM  or increase in P2, and no edema   ABD:  Quite obese soft and nontender with nl inspiratory excursion in the supine position. No bruits or organomegaly appreciated, bowel sounds nl  MS:  Nl gait/ ext warm without deformities, calf tenderness, cyanosis or clubbing No obvious joint restrictions   SKIN: warm and dry without lesions    NEURO:  alert, approp, nl sensorium with  no motor or cerebellar deficits apparent.    Labs ordered/ reviewed:   CXR PA and Lateral:   09/29/2019 :    I personally reviewed images and agree with radiology impression as follows:   Mild CHF.       Chemistry      Component Value Date/Time   NA 143 08/11/2019 1234   NA 144 06/28/2018 1154   K 3.8 08/11/2019 1234   CL 105 08/11/2019 1234   CO2 30 08/11/2019 1234   BUN 11 08/11/2019 1234   BUN 17 06/28/2018 1154   CREATININE 1.11 08/11/2019 1234      Component Value Date/Time   CALCIUM 9.4 08/11/2019 1234   ALKPHOS 59 02/18/2017 0639   AST 33 02/18/2017 0639   ALT 20 02/18/2017 0639   BILITOT 0.9 02/18/2017 0639         Lab Results  Component Value Date   WBC 9.8 09/29/2019   HGB 13.2 09/29/2019   HCT 45.7 09/29/2019   MCV 102.0 (H) 09/29/2019   PLT 276 09/29/2019       EOS  0.2                                   09/29/2019   Lab Results  Component Value Date   DDIMER 1.27 (H) 09/29/2019      Lab Results  Component Value Date   TSH 2.837 09/29/2019     BNP   09/29/2019   = 1057    Lab Results  Component Value Date   ESRSEDRATE 53 (H) 09/29/2019           Assessment   DOE (dyspnea on exertion) Onset March, 2015. : Sensation of shortness of breath at times and walking to the mailbox - much worse p covid  Dec 2020 and entresto d/c'd VQ low prob 06/23/19 (done for elevated d dimer)  - Echo 09/25/19 : Left ventricular ejection fraction, by estimation, is 35%. The left  ventricle has moderately decreased function. The left ventricle  demonstrates global hypokinesis. There is moderate left ventricular  hypertrophy. Left ventricular diastolic parameters  are consistent with Grade I diastolic dysfunction (impaired relaxation).  Elevated left atrial pressure.  2. The ventricular septum is flattened in diastole suggesting RV volume  overload. . Right ventricular systolic function was not well visualized.  The right ventricular size is not well visualized. There is moderately  elevated pulmonary artery systolic  pressure.  3. Left atrial size was mildly dilated.  4. Right atrial size was mildly dilated.  5. The mitral valve is abnormal. Mild mitral valve regurgitation. No  evidence of mitral stenosis.  6. Tricuspid valve regurgitation is mild to moderate.  7. The aortic valve is tricuspid. Aortic valve regurgitation is moderate.  Mild to moderate aortic valve stenosis.  8. There is mild to moderate pulmonary HTN, PASP is 37 mmHg.  9. The inferior vena cava is normal in size with greater than 50%  respiratory variability,  suggesting right atrial pressure of 3 mmHg.  - entresto restarted 09/27/19   Clear component of chf already being addressed by Dr Diona Browner with f/u already planned in 2 weeks    ddx also includes occult PE but note had D dimer elevation p covid already eval with neg VQ so no need for now for additional w/u s new findings/ symptoms as d dimer is non-specific finding in this setting = chronically ill elderly (and really only has good predictive value when nl or high nl)      Chronic respiratory failure with hypoxia (HCC) Onset at Covid 19 pna 06/2019  - 09/29/2019  sat on RA at rest was 80%-- pt placed on o2 3lpm and sats increased to 93%--walked up hallway x 1 and sats decreased to 84%--o2 increased to 4 lpm-- sats increased to 90%-- walked another lap and sats decreased to 88%--increased to 5lpm and walked another lap with sats ending at 90%   For now will need 3lpm rest/ noct and 5 lpm walking plus  Advised: Make sure you check your oxygen saturations at highest level of activity to be sure it stays over 90% and adjust upward to maintain this level if needed but remember to turn it back to previous settings when you stop (to conserve your supply).   >>> also needs humidified 02 esp at hs    >>> f/u 2 weeks with all meds in hand using a trust but verify approach to confirm accurate Medication  Reconciliation The principal here is that until we are certain  that the  patients are doing what we've asked, it makes no sense to ask them to do more.     Each maintenance medication was reviewed in detail including emphasizing most importantly the difference between maintenance and prns and under what circumstances the prns are to be triggered using an action plan format where appropriate.  Total time for H and P, chart review, counseling,  directly observing portions of ambulatory 02 saturation study/  and generating customized AVS unique to this office visit / charting = 60 min            Sandrea Hughs, MD 09/30/2019

## 2019-09-29 NOTE — Assessment & Plan Note (Addendum)
Onset at Covid 19 pna 06/2019  - 09/29/2019  sat on RA at rest was 80%-- pt placed on o2 3lpm and sats increased to 93%--walked up hallway x 1 and sats decreased to 84%--o2 increased to 4 lpm-- sats increased to 90%-- walked another lap and sats decreased to 88%--increased to 5lpm and walked another lap with sats ending at 90%   For now will need 3lpm rest/ noct and 5 lpm walking plus  Advised: Make sure you check your oxygen saturations at highest level of activity to be sure it stays over 90% and adjust upward to maintain this level if needed but remember to turn it back to previous settings when you stop (to conserve your supply).   >>> also needs humidified 02 esp at hs    >>> with all meds in hand using a trust but verify approach to confirm accurate Medication  Reconciliation The principal here is that until we are certain that the  patients are doing what we've asked, it makes no sense to ask them to do more.       Each maintenance medication was reviewed in detail including emphasizing most importantly the difference between maintenance and prns and under what circumstances the prns are to be triggered using an action plan format where appropriate.  Total time for H and P, chart review, counseling,  directly observing portions of ambulatory 02 saturation study/  and generating customized AVS unique to this office visit / charting = 60 min

## 2019-09-29 NOTE — Assessment & Plan Note (Addendum)
Onset March, 2015. : Sensation of shortness of breath at times and walking to the mailbox - much worse p covid  Dec 2020 and entresto d/c'd VQ low prob 06/23/19 (done for elevated d dimer)  - Echo 09/25/19 : Left ventricular ejection fraction, by estimation, is 35%. The left  ventricle has moderately decreased function. The left ventricle  demonstrates global hypokinesis. There is moderate left ventricular  hypertrophy. Left ventricular diastolic parameters  are consistent with Grade I diastolic dysfunction (impaired relaxation).  Elevated left atrial pressure.  2. The ventricular septum is flattened in diastole suggesting RV volume  overload. . Right ventricular systolic function was not well visualized.  The right ventricular size is not well visualized. There is moderately  elevated pulmonary artery systolic  pressure.  3. Left atrial size was mildly dilated.  4. Right atrial size was mildly dilated.  5. The mitral valve is abnormal. Mild mitral valve regurgitation. No  evidence of mitral stenosis.  6. Tricuspid valve regurgitation is mild to moderate.  7. The aortic valve is tricuspid. Aortic valve regurgitation is moderate.  Mild to moderate aortic valve stenosis.  8. There is mild to moderate pulmonary HTN, PASP is 37 mmHg.  9. The inferior vena cava is normal in size with greater than 50%  respiratory variability, suggesting right atrial pressure of 3 mmHg.  - entresto restarted 09/27/19   Clear component of chf already being addressed by Dr Diona Browner with f/u already planned in 2 weeks    ddx also includes occult PE but note had D dimer elevation p covid already eval with neg VQ so no need for now for additional w/u s new findings/ symptoms as d dimer is non-specific finding in this setting = chronically ill elderly (and really only has good predictive value when nl or high nl)

## 2019-09-29 NOTE — Telephone Encounter (Signed)
Niece, Jasmine December informed and verbalized understanding of plan.

## 2019-09-29 NOTE — Patient Instructions (Addendum)
Will send order to [facility] to be sure your oxygen is humdified   Please remember to go to the lab department   for your tests - we will call you with the results when they are available - ok to wait 10 days as per Dr McDowell's instructions    Make sure you check your oxygen saturations at highest level of activity to be sure it stays over 90% and adjust upward to maintain this level if needed but remember to turn it back to previous settings when you stop (to conserve your supply).    Please schedule a follow up office visit in 2 weeks, sooner if needed

## 2019-09-30 ENCOUNTER — Telehealth: Payer: Self-pay | Admitting: Internal Medicine

## 2019-09-30 ENCOUNTER — Encounter: Payer: Self-pay | Admitting: Internal Medicine

## 2019-09-30 NOTE — Progress Notes (Signed)
Spoke with pt and notified of results per Dr. Wert. Pt verbalized understanding and denied any questions. 

## 2019-09-30 NOTE — Telephone Encounter (Signed)
I spoke with the pt's daughter and explained that I already spoke with pt about his cxr and then Dr Sherene Sires personally called and spoke with the pt per his request. Nothing further needed.

## 2019-10-03 ENCOUNTER — Other Ambulatory Visit: Payer: Self-pay | Admitting: Student

## 2019-10-03 DIAGNOSIS — I5042 Chronic combined systolic (congestive) and diastolic (congestive) heart failure: Secondary | ICD-10-CM

## 2019-10-06 DIAGNOSIS — I110 Hypertensive heart disease with heart failure: Secondary | ICD-10-CM | POA: Diagnosis not present

## 2019-10-06 DIAGNOSIS — U071 COVID-19: Secondary | ICD-10-CM | POA: Diagnosis not present

## 2019-10-06 DIAGNOSIS — I5042 Chronic combined systolic (congestive) and diastolic (congestive) heart failure: Secondary | ICD-10-CM | POA: Diagnosis not present

## 2019-10-08 ENCOUNTER — Encounter: Payer: Self-pay | Admitting: *Deleted

## 2019-10-08 ENCOUNTER — Telehealth: Payer: Self-pay | Admitting: *Deleted

## 2019-10-08 NOTE — Telephone Encounter (Signed)
-----   Message from Jonelle Sidle, MD sent at 10/06/2019  2:04 PM EDT ----- Results reviewed.  Creatinine 1.33, up from 1.1, potassium normal.  This follows recent addition of Entresto.  Would continue same for now.

## 2019-10-08 NOTE — Telephone Encounter (Signed)
Patient informed. Copy sent to PCP °

## 2019-10-08 NOTE — Telephone Encounter (Signed)
This encounter was created in error - please disregard.

## 2019-10-11 DIAGNOSIS — U071 COVID-19: Secondary | ICD-10-CM | POA: Diagnosis not present

## 2019-10-13 DIAGNOSIS — I5022 Chronic systolic (congestive) heart failure: Secondary | ICD-10-CM | POA: Diagnosis not present

## 2019-10-13 DIAGNOSIS — N1831 Chronic kidney disease, stage 3a: Secondary | ICD-10-CM | POA: Diagnosis not present

## 2019-10-13 DIAGNOSIS — I10 Essential (primary) hypertension: Secondary | ICD-10-CM | POA: Diagnosis not present

## 2019-10-13 DIAGNOSIS — E1121 Type 2 diabetes mellitus with diabetic nephropathy: Secondary | ICD-10-CM | POA: Diagnosis not present

## 2019-10-13 DIAGNOSIS — K219 Gastro-esophageal reflux disease without esophagitis: Secondary | ICD-10-CM | POA: Diagnosis not present

## 2019-10-14 ENCOUNTER — Ambulatory Visit (INDEPENDENT_AMBULATORY_CARE_PROVIDER_SITE_OTHER): Payer: Medicare PPO | Admitting: *Deleted

## 2019-10-14 DIAGNOSIS — I5042 Chronic combined systolic (congestive) and diastolic (congestive) heart failure: Secondary | ICD-10-CM

## 2019-10-16 ENCOUNTER — Ambulatory Visit: Payer: Medicare PPO | Admitting: Internal Medicine

## 2019-10-16 ENCOUNTER — Telehealth: Payer: Self-pay

## 2019-10-16 LAB — CUP PACEART REMOTE DEVICE CHECK
Battery Remaining Longevity: 45 mo
Battery Remaining Percentage: 77 %
Battery Voltage: 2.96 V
Brady Statistic AP VP Percent: 81 %
Brady Statistic AP VS Percent: 1 %
Brady Statistic AS VP Percent: 16 %
Brady Statistic AS VS Percent: 1 %
Brady Statistic RA Percent Paced: 80 %
Date Time Interrogation Session: 20210506103017
HighPow Impedance: 57 Ohm
HighPow Impedance: 57 Ohm
Implantable Lead Implant Date: 20200205
Implantable Lead Implant Date: 20200205
Implantable Lead Implant Date: 20200205
Implantable Lead Location: 753858
Implantable Lead Location: 753859
Implantable Lead Location: 753860
Implantable Pulse Generator Implant Date: 20200205
Lead Channel Impedance Value: 430 Ohm
Lead Channel Impedance Value: 450 Ohm
Lead Channel Impedance Value: 550 Ohm
Lead Channel Pacing Threshold Amplitude: 0.625 V
Lead Channel Pacing Threshold Amplitude: 0.75 V
Lead Channel Pacing Threshold Amplitude: 1 V
Lead Channel Pacing Threshold Pulse Width: 0.5 ms
Lead Channel Pacing Threshold Pulse Width: 0.5 ms
Lead Channel Pacing Threshold Pulse Width: 0.5 ms
Lead Channel Sensing Intrinsic Amplitude: 11.7 mV
Lead Channel Sensing Intrinsic Amplitude: 2.3 mV
Lead Channel Setting Pacing Amplitude: 2 V
Lead Channel Setting Pacing Amplitude: 3.5 V
Lead Channel Setting Pacing Amplitude: 3.5 V
Lead Channel Setting Pacing Pulse Width: 0.5 ms
Lead Channel Setting Pacing Pulse Width: 0.5 ms
Lead Channel Setting Sensing Sensitivity: 0.5 mV
Pulse Gen Serial Number: 9880571

## 2019-10-16 NOTE — Progress Notes (Signed)
Remote ICD transmission.   

## 2019-10-16 NOTE — Telephone Encounter (Signed)
Spoke with patient to remind of missed remote transmission 

## 2019-10-27 ENCOUNTER — Ambulatory Visit (HOSPITAL_COMMUNITY): Payer: Medicare PPO | Attending: Cardiology

## 2019-10-27 ENCOUNTER — Ambulatory Visit: Payer: Medicare PPO | Admitting: Internal Medicine

## 2019-10-27 ENCOUNTER — Ambulatory Visit (INDEPENDENT_AMBULATORY_CARE_PROVIDER_SITE_OTHER): Payer: Medicare PPO | Admitting: Student

## 2019-10-27 ENCOUNTER — Other Ambulatory Visit: Payer: Self-pay

## 2019-10-27 DIAGNOSIS — I255 Ischemic cardiomyopathy: Secondary | ICD-10-CM | POA: Diagnosis not present

## 2019-10-27 DIAGNOSIS — I5042 Chronic combined systolic (congestive) and diastolic (congestive) heart failure: Secondary | ICD-10-CM | POA: Insufficient documentation

## 2019-10-27 DIAGNOSIS — I25119 Atherosclerotic heart disease of native coronary artery with unspecified angina pectoris: Secondary | ICD-10-CM

## 2019-10-27 DIAGNOSIS — R001 Bradycardia, unspecified: Secondary | ICD-10-CM

## 2019-10-27 LAB — CUP PACEART INCLINIC DEVICE CHECK
Battery Remaining Longevity: 63 mo
Brady Statistic RA Percent Paced: 79 %
Brady Statistic RV Percent Paced: 98 %
Date Time Interrogation Session: 20210517121842
HighPow Impedance: 68.625
Implantable Lead Implant Date: 20200205
Implantable Lead Implant Date: 20200205
Implantable Lead Implant Date: 20200205
Implantable Lead Location: 753858
Implantable Lead Location: 753859
Implantable Lead Location: 753860
Implantable Pulse Generator Implant Date: 20200205
Lead Channel Impedance Value: 425 Ohm
Lead Channel Impedance Value: 462.5 Ohm
Lead Channel Impedance Value: 587.5 Ohm
Lead Channel Pacing Threshold Amplitude: 0.5 V
Lead Channel Pacing Threshold Amplitude: 0.5 V
Lead Channel Pacing Threshold Amplitude: 0.625 V
Lead Channel Pacing Threshold Amplitude: 1 V
Lead Channel Pacing Threshold Amplitude: 1 V
Lead Channel Pacing Threshold Pulse Width: 0.5 ms
Lead Channel Pacing Threshold Pulse Width: 0.5 ms
Lead Channel Pacing Threshold Pulse Width: 0.5 ms
Lead Channel Pacing Threshold Pulse Width: 0.5 ms
Lead Channel Pacing Threshold Pulse Width: 0.5 ms
Lead Channel Sensing Intrinsic Amplitude: 11.7 mV
Lead Channel Sensing Intrinsic Amplitude: 3.4 mV
Lead Channel Setting Pacing Amplitude: 2 V
Lead Channel Setting Pacing Amplitude: 2 V
Lead Channel Setting Pacing Amplitude: 2 V
Lead Channel Setting Pacing Pulse Width: 0.5 ms
Lead Channel Setting Pacing Pulse Width: 0.5 ms
Lead Channel Setting Sensing Sensitivity: 0.5 mV
Pulse Gen Serial Number: 9880571

## 2019-10-27 NOTE — Progress Notes (Signed)
Electrophysiology CRT Nonresponder Note  Date: 10/27/2019  ID:  Allen [name], DOB 12-10-1933, MRN [id number]  PCP: [name], MD Primary Cardiologist: [name], MD Electrophysiologist: Hillis Range, MD   CC: Heart failure despite LV lead placement  Allen [name] is a 86 y.o. male seen today for Dr. Johney Frame. They present today for optimization of CRT.   He has NYHA Class III CHF symptoms, confounded by recent COVID diagnosis and new oxygen requirement.  Since last being seen in our clinic, the patient reports doing about the same. He remains SOB with mild exertion per his wife.  He denies chest pain, palpitations, PND, orthopnea, nausea, vomiting, dizziness, syncope, or edema.  Device History: ICD implanted 07/17/2018 for ICM, NYHA III CHF, SSS, and LBBB History of appropriate therapy: No History of AAD therapy: No  LV lead History: Model: Quartet 1458Q / 86 cm Location: "Through MB-2 into the distal lateral branch, approx 1/3 from the base to the apex in a very lateral position" Threshold 0.75V @ 0.10ms Vector D1 -> RV coil Revisions/CXR N/A Diaphragmatic Stim No VV timing RV -> LV 10 ms Prior optimzation/changes: Dr. Johney Frame at implant -> "QRS evaluation during different VV timings revealed baseline QRS duration of 200 msec.  With RV prior to LV by 10 msec, QRS was 150 msec.  With simultaneous RV-LV pacing, QRS was 160 msec.  All other configurations were greater.  Programmed RV prior to LV by 10 msec."  Echocardiogram: Pre-device implant: 02/2017 LVEF <20% ([facility]) Post-device implant: 09/25/2019 LVEF 35%  EKG: Pre-device implant: 06/28/2018 Sinus bradycardia at 51 bpm with QRS 180 ms Post-device implant: 07/18/2018 A/V dual paced 70 bpm with QRS 168 ms   Past Medical History:  Diagnosis Date  . AICD (automatic cardioverter/defibrillator) present 07/17/2018   BIV  . CAD (coronary artery disease)    a. 04/2016: s/p CABG with LIMA-LAD, SVG-D1, SVG-RCA, and  seq SVG-OM1-OM2  . Essential hypertension   . Hyperlipidemia   . Ischemic cardiomyopathy    LVEF 35% May 2016  . LBBB (left bundle branch block)   . Pneumonia due to COVID-19 virus   . Sinus bradycardia   . Type 2 diabetes mellitus (HCC)    Past Surgical History:  Procedure Laterality Date  . BIV ICD INSERTION CRT-D  07/17/2018  . BIV ICD INSERTION CRT-D N/A 07/17/2018   Procedure: BIV ICD INSERTION CRT-D;  Surgeon: Hillis Range, MD;  Location: [facility] INVASIVE CV LAB;  Service: Cardiovascular;  Laterality: N/A;  . CARDIAC CATHETERIZATION N/A 11/02/2014   Procedure: Left Heart Cath and Coronary Angiography;  Surgeon: Lyn Records, MD;  Location: [facility] INVASIVE CV LAB;  Service: Cardiovascular;  Laterality: N/A;  . CARDIAC CATHETERIZATION N/A 05/03/2016   Procedure: Left Heart Cath and Coronary Angiography;  Surgeon: Peter M Swaziland, MD;  Location: [facility] INVASIVE CV LAB;  Service: Cardiovascular;  Laterality: N/A;  . CORONARY ARTERY BYPASS GRAFT N/A 05/08/2016   Procedure: CORONARY ARTERY BYPASS GRAFTING (CABG) x 5;  Surgeon: Alleen Borne, MD;  Location: [facility] OR;  Service: Open Heart Surgery;  Laterality: N/A;  . ENDOVEIN HARVEST OF GREATER SAPHENOUS VEIN  05/08/2016   Procedure: ENDOVEIN HARVEST OF GREATER SAPHENOUS VEIN;  Surgeon: Alleen Borne, MD;  Location: [facility] OR;  Service: Open Heart Surgery;;  . TEE WITHOUT CARDIOVERSION N/A 05/08/2016   Procedure: TRANSESOPHAGEAL ECHOCARDIOGRAM (TEE);  Surgeon: Alleen Borne, MD;  Location: [facility] OR;  Service: Open Heart Surgery;  Laterality: N/A;  Current Outpatient Medications  Medication Sig Dispense Refill  . aspirin 81 MG EC tablet Take 81 mg by mouth daily.    Marland Kitchen atorvastatin (LIPITOR) 80 MG tablet Take 1 tablet (80 mg total) by mouth daily at 6 PM. 90 tablet 3  . carvedilol (COREG) 3.125 MG tablet TAKE 1 TABLET  TWO  TIMES DAILY. 180 tablet 2  . cholecalciferol (VITAMIN D3) 25 MCG (1000 UNIT) tablet Take 1,000 Units by mouth daily.    . clopidogrel  (PLAVIX) 75 MG tablet TAKE 1 TABLET ONE TIME DAILY WITH BREAKFAST 90 tablet 3  . furosemide (LASIX) 20 MG tablet Take 20 mg by mouth daily as needed. Weight gain of 2-3 lbs in a 24 hour period or 5 lbs in one week    . Insulin Glargine (LANTUS SOLOSTAR) 100 UNIT/ML Solostar Pen Inject 20 Units into the skin at bedtime. 15 mL 11  . Insulin Lispro Junior KwikPen 100 UNIT/ML SOPN     . mirtazapine (REMERON) 15 MG tablet Take 15 mg by mouth daily at 12 noon.     . nitroGLYCERIN (NITROSTAT) 0.4 MG SL tablet Place 1 tablet (0.4 mg total) under the tongue every 5 (five) minutes x 3 doses as needed for chest pain (if no relief after 3rd dose, proceed to the ED for an evaluation). 75 tablet 1  . OXYGEN 3 lpm 24/7 DME- [facility]    . repaglinide (PRANDIN) 2 MG tablet Take 2 mg by mouth 2 (two) times daily before a meal.     . sacubitril-valsartan (ENTRESTO) 24-26 MG Take 1 tablet by mouth 2 (two) times daily. 60 tablet 6  . vitamin B-12 (CYANOCOBALAMIN) 1000 MCG tablet      No current facility-administered medications for this visit.    Allergies:   Patient has no known allergies.   Social History: Social History   Socioeconomic History  . Marital status: Married    Spouse name: Not on file  . Number of children: Not on file  . Years of education: Not on file  . Highest education level: Not on file  Occupational History  . Occupation: [name]    Comment: Retired  Tobacco Use  . Smoking status: Former Smoker    Packs/day: 0.50    Years: 56.00    Pack years: 28.00    Types: Cigarettes    Start date: 02/18/1956    Quit date: 06/13/2011    Years since quitting: 8.3  . Smokeless tobacco: Never Used  Substance and Sexual Activity  . Alcohol use: No    Alcohol/week: 0.0 standard drinks  . Drug use: No  . Sexual activity: Never  Other Topics Concern  . 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 Difficulty of Paying Living Expenses:   Food Insecurity:   . Worried About Charity fundraiser in the Last Year:   . Arboriculturist in the Last Year:   Transportation Needs:   . Film/video editor (Medical):   Marland Kitchen Lack of Transportation (Non-Medical):   Physical Activity:   . Days of Exercise per Week:   . Minutes of Exercise per Session:   Stress:   . Feeling of Stress :   Social Connections:   . Frequency of Communication with Friends and Family:   . Frequency of Social Gatherings with Friends and Family:   .  Attends Religious Services:   . Active Member of Clubs or Organizations:   . Attends Banker Meetings:   Marland Kitchen Marital Status:   Intimate Partner Violence:   . Fear of Current or Ex-Partner:   . Emotionally Abused:   Marland Kitchen Physically Abused:   . Sexually Abused:     Family History: Family History  Problem Relation Age of Onset  . Unexplained death Father   . Unexplained death Mother        Old age  . Coronary artery disease Brother 22    Review of Systems: All other systems reviewed and are otherwise negative except as noted above.   Wt Readings from Last 3 Encounters:  09/29/19 171 lb (77.6 kg)  09/02/19 170 lb (77.1 kg)  05/16/19 184 lb (83.5 kg)     GEN- The patient is well appearing, alert and oriented x 3 today.   HEENT: normocephalic, atraumatic; sclera clear, conjunctiva pink; hearing intact; oropharynx clear; neck supple, no JVP Lymph- no cervical lymphadenopathy Lungs- Clear to ausculation bilaterally, normal work of breathing.  No wheezes, rales, rhonchi Heart- Regular rate and rhythm, no murmurs, rubs or gallops, PMI not laterally displaced GI- soft, non-tender, non-distended, bowel sounds present, no hepatosplenomegaly Extremities- no clubbing, cyanosis, or edema; DP/PT/radial pulses 2+ bilaterally MS- no significant deformity or atrophy Skin- warm and dry, no rash or lesion; device pocket well healed Psych-  euthymic mood, full affect Neuro- strength and sensation are intact  ICD interrogation: CRT pacing: 98% AF: <1% PVC burden: 1.7% Thoracic impedence: stable near threshold Activity Level: 1-2 hrs daily HR excursion: Somewhat flat, with most beats in 60s. Consider turning on rate response at next visit. Although, sensor indicated rates very similar to current histograms.  See PaceArt report for full details.   EKG:  EKG is not ordered today.  Recent Labs: 08/11/2019: BUN 11; Creatinine, Ser 1.11; Potassium 3.8; Sodium 143 09/29/2019: B Natriuretic Peptide 1,057.0; Hemoglobin 13.2; Platelets 276; TSH 2.837     Other studies Reviewed: Additional studies/ records that were reviewed today include: Previous office notes, previous EKGs, implant notes,  Previous remote reports.   Assessment and Plan:  1.  Chronic systolic heart failure Patient reports continued SOB status post CRT implant, confounded by recent COVID Device interrogation today demonstrates above.  PMT noted. V-A timing 403 ms. PMT seldom and aborted by algorithm. No changes made today in this regard. AV optimization echo pending  PAV changed to 130 ms with best E and A wave separation without truncation on imaging. PAV tested down to 110 ms and up to 180 ms (baseline). Separation clearly improved down to 130 ms, with diminishing changes lower than that. 140 ms with poor separation compared to 130 ms.   RA and LV thresholds decreased from acute margins to appropriate safety margins.  No V-V changes made today. Will see back in 6 weeks to consider under EKG. Consider multi-site pacing at that time as well.   Current medicines are reviewed at length with the patient today.   The patient does not have concerns regarding his medicines.  The following changes were made today:  none  Labs/ tests ordered today include:  Orders Placed This Encounter  Procedures  . CUP PACEART INCLINIC DEVICE CHECK   Disposition:   FU with EP  APP in 6 weeks for further optimization consideration.  Dustin Flock, PA-C  10/27/2019 12:49 PM  [facility] [address] [place] [address] [phone] (office) (  [phone] (fax

## 2019-10-30 DIAGNOSIS — Z6827 Body mass index (BMI) 27.0-27.9, adult: Secondary | ICD-10-CM | POA: Diagnosis not present

## 2019-10-30 DIAGNOSIS — I10 Essential (primary) hypertension: Secondary | ICD-10-CM | POA: Diagnosis not present

## 2019-10-30 DIAGNOSIS — I5022 Chronic systolic (congestive) heart failure: Secondary | ICD-10-CM | POA: Diagnosis not present

## 2019-10-30 DIAGNOSIS — N1831 Chronic kidney disease, stage 3a: Secondary | ICD-10-CM | POA: Diagnosis not present

## 2019-10-30 DIAGNOSIS — E1121 Type 2 diabetes mellitus with diabetic nephropathy: Secondary | ICD-10-CM | POA: Diagnosis not present

## 2019-10-30 DIAGNOSIS — K219 Gastro-esophageal reflux disease without esophagitis: Secondary | ICD-10-CM | POA: Diagnosis not present

## 2019-10-31 ENCOUNTER — Encounter: Payer: Self-pay | Admitting: Internal Medicine

## 2019-10-31 ENCOUNTER — Other Ambulatory Visit: Payer: Self-pay

## 2019-10-31 ENCOUNTER — Ambulatory Visit (INDEPENDENT_AMBULATORY_CARE_PROVIDER_SITE_OTHER): Payer: Medicare PPO | Admitting: Internal Medicine

## 2019-10-31 DIAGNOSIS — R0600 Dyspnea, unspecified: Secondary | ICD-10-CM

## 2019-10-31 DIAGNOSIS — J9611 Chronic respiratory failure with hypoxia: Secondary | ICD-10-CM | POA: Diagnosis not present

## 2019-10-31 DIAGNOSIS — R0609 Other forms of dyspnea: Secondary | ICD-10-CM

## 2019-10-31 NOTE — Patient Instructions (Addendum)
Make sure you check your oxygen saturations at highest level of activity to be sure it stays over 90% and adjust upward to maintain this level if needed but remember to turn it back to previous settings when you stop (to conserve your supply).    Please schedule a follow up office visit in 6 weeks, call sooner if needed with all medications /inhalers/ solutions in hand so we can verify exactly what you are taking. This includes all medications from all doctors and over the counters

## 2019-10-31 NOTE — Progress Notes (Signed)
Allen [name], male    DOB: 02/03/1934, 86 y.o.   MRN: [id number]   Brief patient profile:  85 yobm quit smoking 2013 without apparent sequelae then onset covid 19 xmas 2020 and admitted Jan 2021 and breathing hasn't been right since so referred to pulmonary clinic in [facility]  09/29/2019 by Dr   Domenic Polite      History of Present Illness  09/29/2019  Pulmonary/ 1st office eval/Wert  Chief Complaint  Patient presents with  . Pulmonary Consult    Referred by Dr Rozann Lesches. Pt c/o SOB since had covid Dec 2020.   Dyspnea:  Breathing about the same as at d/c from [facility] can walk flat for 3-4 min does not check while walking Cough: not much , thick mucus from nose  Sleep: bed is flat / 2 pillows  SABA use:  02  3 concentrator and 3lpm on portable system does not titrate rec Will send order to [facility] to be sure your oxygen is humdified Please remember to go to the lab department   for your tests - we will call you with the results when they are available - ok to wait 10 days as per Dr McDowell's instructions  Make sure you check your oxygen saturations at highest level of activity to be sure it stays over 90% and adjust upward to maintain this level if needed but remember to turn it back to previous settings when you stop (to conserve your supply).    10/31/2019  f/u ov/Wert re: doe / 02 dep since covid, got 1st vaccine ? Medical illustrator Complaint  Patient presents with  . Follow-up    Breathing has improved since the last visit. No cough or wheezing.   Dyspnea:  Walk in from [facility] parking on 3lpm portable tank , can do one aisle at grocery store s variability  Cough: none Sleeping: bed is flat 2 pillows  SABA use: no inhalers 02:   3lpm 24/7    No obvious day to day or daytime variability or assoc excess/ purulent sputum or mucus plugs or hemoptysis or cp or chest tightness, subjective wheeze or overt sinus or hb symptoms.   Sleeping  without nocturnal  or early  am exacerbation  of respiratory  c/o's or need for noct saba. Also denies any obvious fluctuation of symptoms with weather or environmental changes or other aggravating or alleviating factors except as outlined above   No unusual exposure hx or h/o childhood pna/ asthma or knowledge of premature birth.  Current Allergies, Complete Past Medical History, Past Surgical History, Family History, and Social History were reviewed in [employer] record.  ROS  The following are not active complaints unless bolded Hoarseness, sore throat, dysphagia, dental problems, itching, sneezing,  nasal congestion or discharge of excess mucus or purulent secretions, ear ache,   fever, chills, sweats, unintended wt loss or wt gain, classically pleuritic or exertional cp,  orthopnea pnd or arm/hand swelling  or leg swelling, presyncope, palpitations, abdominal pain, anorexia, nausea, vomiting, diarrhea  or change in bowel habits or change in bladder habits, change in stools or change in urine, dysuria, hematuria,  rash, arthralgias, visual complaints, headache, numbness, weakness or ataxia or problems with walking or coordination,  change in mood or  memory.        Current Meds  Medication Sig  . aspirin 81 MG EC tablet Take 81 mg by mouth daily.  Marland Kitchen atorvastatin (LIPITOR) 80 MG tablet Take 1 tablet (  80 mg total) by mouth daily at 6 PM.  . carvedilol (COREG) 3.125 MG tablet TAKE 1 TABLET  TWO  TIMES DAILY.  . cholecalciferol (VITAMIN D3) 25 MCG (1000 UNIT) tablet Take 1,000 Units by mouth daily.  . clopidogrel (PLAVIX) 75 MG tablet TAKE 1 TABLET ONE TIME DAILY WITH BREAKFAST  . furosemide (LASIX) 20 MG tablet Take 20 mg by mouth daily as needed. Weight gain of 2-3 lbs in a 24 hour period or 5 lbs in one week  . Insulin Glargine (LANTUS SOLOSTAR) 100 UNIT/ML Solostar Pen Inject 20 Units into the skin at bedtime.  . Insulin Lispro Junior KwikPen 100 UNIT/ML SOPN   . mirtazapine (REMERON) 15 MG tablet  Take 15 mg by mouth daily at 12 noon.   . OXYGEN 3 lpm 24/7 DME- [facility]  . repaglinide (PRANDIN) 2 MG tablet Take 2 mg by mouth 2 (two) times daily before a meal.   . sacubitril-valsartan (ENTRESTO) 24-26 MG Take 1 tablet by mouth 2 (two) times daily.  . vitamin B-12 (CYANOCOBALAMIN) 1000 MCG tablet                 Past Medical History:  Diagnosis Date  . AICD (automatic cardioverter/defibrillator) present 07/17/2018   BIV  . CAD (coronary artery disease)    a. 04/2016: s/p CABG with LIMA-LAD, SVG-D1, SVG-RCA, and seq SVG-OM1-OM2  . Essential hypertension   . Hyperlipidemia   . Ischemic cardiomyopathy    LVEF 35% May 2016  . LBBB (left bundle branch block)   . Pneumonia due to COVID-19 virus   . Sinus bradycardia   . Type 2 diabetes mellitus (HCC)        Objective:     Wt Readings from Last 3 Encounters:  10/31/19 173 lb (78.5 kg)  09/29/19 171 lb (77.6 kg)  09/02/19 170 lb (77.1 kg)     Vital signs reviewed - Note on arrival 02 sats  95% on 3lpm       amb bm nad   HEENT : pt wearing mask not removed for exam due to covid -19 concerns.    NECK :  without JVD/Nodes/TM/ nl carotid upstrokes bilaterally   LUNGS: no acc muscle use,  Nl contour chest which is clear to A and P bilaterally without cough on insp or exp maneuvers   CV:  RRR  no s3 or  III/VI sem  - ? Decreased S2 , no increase in P2, and no edema   ABD:  soft and nontender with nl inspiratory excursion in the supine position. No bruits or organomegaly appreciated, bowel sounds nl  MS:  Nl gait/ ext warm without deformities, calf tenderness, cyanosis or clubbing No obvious joint restrictions   SKIN: warm and dry without lesions    NEURO:  alert, approp, nl sensorium with  no motor or cerebellar deficits apparent.        Assessment

## 2019-11-02 ENCOUNTER — Encounter: Payer: Self-pay | Admitting: Internal Medicine

## 2019-11-02 NOTE — Assessment & Plan Note (Addendum)
Onset at Covid 19 pna 06/2019  - 09/29/2019  sat on RA at rest was 80%-- pt placed on o2 3lpm and sats increased to 93%--walked up hallway x 1 and sats decreased to 84%--o2 increased to 4 lpm-- sats increased to 90%-- walked another lap and sats decreased to 88%--increased to 5lpm and walked another lap with sats ending at 90%//lmr -  10/31/2019   Walked RA  approx   300 ft  @ avg pace/unsteady pace  stopped due to  Sob and sats down to 88% on 5lpm continuous    Again advised to increase 02 to 5lpm walking with goal of keeping > 90% at all times due to underlying chf          Each maintenance medication was reviewed in detail including emphasizing most importantly the difference between maintenance and prns and under what circumstances the prns are to be triggered using an action plan format where appropriate.  Total time for H and P, chart review, counseling,   directly observing portions of ambulatory 02 saturation study/  and generating customized AVS unique to this office visit / charting = 20 min

## 2019-11-02 NOTE — Assessment & Plan Note (Signed)
Onset March, 2015. : Sensation of shortness of breath at rest at  times and always when walking to the mailbox - much worse p covid  Dec 2020 and entresto d/c'd VQ low prob 06/23/19 (done for elevated d dimer)  - Echo 09/25/19 : Left ventricular ejection fraction, by estimation, is 35%. The left  ventricle has moderately decreased function. The left ventricle  demonstrates global hypokinesis. There is moderate left ventricular  hypertrophy. Left ventricular diastolic parameters  are consistent with Grade I diastolic dysfunction (impaired relaxation).  Elevated left atrial pressure.  2. The ventricular septum is flattened in diastole suggesting RV volume  overload. . Right ventricular systolic function was not well visualized.  The right ventricular size is not well visualized. There is moderately  elevated pulmonary artery systolic  pressure.  3. Left atrial size was mildly dilated.  4. Right atrial size was mildly dilated.  5. The mitral valve is abnormal. Mild mitral valve regurgitation. No  evidence of mitral stenosis.  6. Tricuspid valve regurgitation is mild to moderate.  7. The aortic valve is tricuspid. Aortic valve regurgitation is moderate.  Mild to moderate aortic valve stenosis.  8. There is mild to moderate pulmonary HTN, PASP is 37 mmHg.  9. The inferior vena cava is normal in size with greater than 50%  respiratory variability, suggesting right atrial pressure of 3 mmHg.  - entresto restarted 09/27/19   Nothing to offer at this point that to assure he has adequate sats when he does walk and f/u in 6 weeks with close cards f/u in meantime

## 2019-11-05 DIAGNOSIS — U071 COVID-19: Secondary | ICD-10-CM | POA: Diagnosis not present

## 2019-11-11 DIAGNOSIS — U071 COVID-19: Secondary | ICD-10-CM | POA: Diagnosis not present

## 2019-11-13 DIAGNOSIS — E1121 Type 2 diabetes mellitus with diabetic nephropathy: Secondary | ICD-10-CM | POA: Diagnosis not present

## 2019-11-13 DIAGNOSIS — I5022 Chronic systolic (congestive) heart failure: Secondary | ICD-10-CM | POA: Diagnosis not present

## 2019-11-13 DIAGNOSIS — K219 Gastro-esophageal reflux disease without esophagitis: Secondary | ICD-10-CM | POA: Diagnosis not present

## 2019-11-13 DIAGNOSIS — N1831 Chronic kidney disease, stage 3a: Secondary | ICD-10-CM | POA: Diagnosis not present

## 2019-11-13 DIAGNOSIS — I10 Essential (primary) hypertension: Secondary | ICD-10-CM | POA: Diagnosis not present

## 2019-11-14 ENCOUNTER — Ambulatory Visit (INDEPENDENT_AMBULATORY_CARE_PROVIDER_SITE_OTHER): Payer: Medicare PPO | Admitting: Cardiology

## 2019-11-14 ENCOUNTER — Encounter: Payer: Self-pay | Admitting: Cardiology

## 2019-11-14 ENCOUNTER — Other Ambulatory Visit: Payer: Self-pay

## 2019-11-14 VITALS — BP 164/84 | HR 74 | Ht 62.0 in | Wt 176.2 lb

## 2019-11-14 DIAGNOSIS — I255 Ischemic cardiomyopathy: Secondary | ICD-10-CM | POA: Diagnosis not present

## 2019-11-14 DIAGNOSIS — I25119 Atherosclerotic heart disease of native coronary artery with unspecified angina pectoris: Secondary | ICD-10-CM

## 2019-11-14 DIAGNOSIS — I5042 Chronic combined systolic (congestive) and diastolic (congestive) heart failure: Secondary | ICD-10-CM

## 2019-11-14 MED ORDER — ENTRESTO 49-51 MG PO TABS: 1.0000 | ORAL_TABLET | Freq: Two times a day (BID) | ORAL | 2 refills | Status: DC

## 2019-11-14 NOTE — Patient Instructions (Signed)
Medication Instructions:   Your physician has recommended you make the following change in your medication:   Increase entresto to 49/51 mg by mouth twice daily  Continue other medications the same  Labwork:  Your physician recommends that you return for lab work in: 7-10 days to check your BMET. This may be done at [facility] from 8:00 am - 4:00 pm. No appointment is needed.  Testing/Procedures:  NONE  Follow-Up:  Your physician recommends that you schedule a follow-up appointment in: 2 months (office).  Any Other Special Instructions Will Be Listed Below (If Applicable).  If you need a refill on your cardiac medications before your next appointment, please call your pharmacy.

## 2019-11-14 NOTE — Progress Notes (Signed)
Cardiology Office Note  Date: 11/14/2019   ID: Allen [name], DOB [birth year], MRN [id number]  PCP:  [name], MD  Cardiologist:  [name], MD Electrophysiologist:  Hillis Range, MD   Chief Complaint  Patient presents with  . Cardiac follow-up    History of Present Illness: Allen [name] is an 86 y.o. male last seen in March.  He is here today with a family member.  He reports stable dyspnea on exertion and fatigue, no obvious angina symptoms.  He has had no device shocks or syncope.  Weight is up a few pounds, but he states that his leg swelling is resolved on Lasix.  Patient follows with Dr. Johney Frame, was seen recently by Mr. Tillery PA-C in the EP clinic.  He has a St. Jude biventricular ICD in place.  He did undergo recent CRT optimization with device adjustments in May.  Most recent LVEF approximately 35% in April.  Office visit noted with Dr. Sherene Sires recently as well.  He remains on supplemental oxygen.  I went over his cardiac regimen today.  He is very hesitant to add any new medications, I did talk with him about Aldactone.  For now we have agreed to increase his Entresto further.  Past Medical History:  Diagnosis Date  . AICD (automatic cardioverter/defibrillator) present 07/17/2018   BIV  . CAD (coronary artery disease)    a. 04/2016: s/p CABG with LIMA-LAD, SVG-D1, SVG-RCA, and seq SVG-OM1-OM2  . Essential hypertension   . Hyperlipidemia   . Ischemic cardiomyopathy    LVEF 35% May 2016  . LBBB (left bundle branch block)   . Pneumonia due to COVID-19 virus   . Sinus bradycardia   . Type 2 diabetes mellitus (HCC)     Past Surgical History:  Procedure Laterality Date  . BIV ICD INSERTION CRT-D  07/17/2018  . BIV ICD INSERTION CRT-D N/A 07/17/2018   Procedure: BIV ICD INSERTION CRT-D;  Surgeon: Hillis Range, MD;  Location: [facility] INVASIVE CV LAB;  Service: Cardiovascular;  Laterality: N/A;  . CARDIAC CATHETERIZATION N/A 11/02/2014   Procedure: Left Heart  Cath and Coronary Angiography;  Surgeon: Lyn Records, MD;  Location: [facility] INVASIVE CV LAB;  Service: Cardiovascular;  Laterality: N/A;  . CARDIAC CATHETERIZATION N/A 05/03/2016   Procedure: Left Heart Cath and Coronary Angiography;  Surgeon: Peter M Swaziland, MD;  Location: [facility] INVASIVE CV LAB;  Service: Cardiovascular;  Laterality: N/A;  . CORONARY ARTERY BYPASS GRAFT N/A 05/08/2016   Procedure: CORONARY ARTERY BYPASS GRAFTING (CABG) x 5;  Surgeon: Alleen Borne, MD;  Location: [facility] OR;  Service: Open Heart Surgery;  Laterality: N/A;  . ENDOVEIN HARVEST OF GREATER SAPHENOUS VEIN  05/08/2016   Procedure: ENDOVEIN HARVEST OF GREATER SAPHENOUS VEIN;  Surgeon: Alleen Borne, MD;  Location: [facility] OR;  Service: Open Heart Surgery;;  . TEE WITHOUT CARDIOVERSION N/A 05/08/2016   Procedure: TRANSESOPHAGEAL ECHOCARDIOGRAM (TEE);  Surgeon: Alleen Borne, MD;  Location: [facility] OR;  Service: Open Heart Surgery;  Laterality: N/A;    Current Outpatient Medications  Medication Sig Dispense Refill  . aspirin 81 MG EC tablet Take 81 mg by mouth daily.    Marland Kitchen atorvastatin (LIPITOR) 80 MG tablet Take 1 tablet (80 mg total) by mouth daily at 6 PM. 90 tablet 3  . carvedilol (COREG) 3.125 MG tablet TAKE 1 TABLET  TWO  TIMES DAILY. 180 tablet 2  . cholecalciferol (VITAMIN D3) 25 MCG (1000 UNIT) tablet Take 1,000 Units by mouth  daily.    . clopidogrel (PLAVIX) 75 MG tablet TAKE 1 TABLET ONE TIME DAILY WITH BREAKFAST 90 tablet 3  . furosemide (LASIX) 20 MG tablet Take 20 mg by mouth daily as needed. Weight gain of 2-3 lbs in a 24 hour period or 5 lbs in one week    . Insulin Glargine (LANTUS SOLOSTAR) 100 UNIT/ML Solostar Pen Inject 20 Units into the skin at bedtime. 15 mL 11  . Insulin Lispro Junior KwikPen 100 UNIT/ML SOPN     . mirtazapine (REMERON) 15 MG tablet Take 15 mg by mouth daily at 12 noon.     . nitroGLYCERIN (NITROSTAT) 0.4 MG SL tablet Place 1 tablet (0.4 mg total) under the tongue every 5 (five) minutes x 3 doses  as needed for chest pain (if no relief after 3rd dose, proceed to the ED for an evaluation). 75 tablet 1  . OXYGEN 3 lpm 24/7 DME- [facility]    . repaglinide (PRANDIN) 2 MG tablet Take 2 mg by mouth 2 (two) times daily before a meal.     . vitamin B-12 (CYANOCOBALAMIN) 1000 MCG tablet     . sacubitril-valsartan (ENTRESTO) 49-51 MG Take 1 tablet by mouth 2 (two) times daily. 180 tablet 2   No current facility-administered medications for this visit.   Allergies:  Patient has no known allergies.   ROS:   No syncope.  Physical Exam: VS:  BP (!) 164/84   Pulse 74   Ht 5\' 2"  (1.575 m)   Wt 176 lb 3.2 oz (79.9 kg)   SpO2 97% Comment: on 2 L O2 via [place]  BMI 32.23 kg/m , BMI Body mass index is 32.23 kg/m.  Wt Readings from Last 3 Encounters:  11/14/19 176 lb 3.2 oz (79.9 kg)  10/31/19 173 lb (78.5 kg)  09/29/19 171 lb (77.6 kg)    General:  Elderly male, appears comfortable at rest.  Wearing oxygen via nasal cannula. HEENT: Conjunctiva and lids normal, wearing a mask. Neck: Supple, no elevated JVP or carotid bruits, no thyromegaly. Lungs: Decreased breath sounds without wheezing, nonlabored breathing at rest. Cardiac: Regular rate and rhythm, no S3, 2/6 systolic murmur, no pericardial rub. Abdomen: Soft, bowel sounds present. Extremities: No pitting edema, distal pulses 2+.  ECG:  An ECG dated 06/17/2019 was personally reviewed today and demonstrated:  Sinus rhythm with intermittent ventricular pacing and PVCs, repolarization abnormalities.  Recent Labwork: 08/11/2019: BUN 11; Creatinine, Ser 1.11; Potassium 3.8; Sodium 143 09/29/2019: B Natriuretic Peptide 1,057.0; Hemoglobin 13.2; Platelets 276; TSH 2.837     Component Value Date/Time   CHOL 170 11/02/2014 0213   TRIG 121 11/02/2014 0213   HDL 45 11/02/2014 0213   CHOLHDL 3.8 11/02/2014 0213   VLDL 24 11/02/2014 0213   LDLCALC 101 (H) 11/02/2014 0213  April 2021: BUN 14, creatinine 1.33, potassium 4.5  Other Studies Reviewed  Today:  Echocardiogram 09/25/2019: 1. Left ventricular ejection fraction, by estimation, is 35%. The left  ventricle has moderately decreased function. The left ventricle  demonstrates global hypokinesis. There is moderate left ventricular  hypertrophy. Left ventricular diastolic parameters  are consistent with Grade I diastolic dysfunction (impaired relaxation).  Elevated left atrial pressure.  2. The ventricular septum is flattened in diastole suggesting RV volume  overload. . Right ventricular systolic function was not well visualized.  The right ventricular size is not well visualized. There is moderately  elevated pulmonary artery systolic  pressure.  3. Left atrial size was mildly dilated.  4. Right atrial  size was mildly dilated.  5. The mitral valve is abnormal. Mild mitral valve regurgitation. No  evidence of mitral stenosis.  6. Tricuspid valve regurgitation is mild to moderate.  7. The aortic valve is tricuspid. Aortic valve regurgitation is moderate.  Mild to moderate aortic valve stenosis.  8. There is mild to moderate pulmonary HTN, PASP is 37 mmHg.  9. The inferior vena cava is normal in size with greater than 50%  respiratory variability, suggesting right atrial pressure of 3 mmHg.   Assessment and Plan:  1.  Ischemic cardiomyopathy with chronic combined heart failure, LVEF approximately 35% by evaluation in April.  We went over his medications today, he is hesitant to add anything new at this time.  Continue Coreg, Lasix, increase Entresto to 49/51 mg twice daily.  Follow-up BMET in 7 to 10 days.  I would like to get him back on Aldactone eventually.  2.  St. Jude biventricular ICD in place.  Keep follow-up with the EP clinic for further optimization.  He does not report any device shocks or syncope.  3.  CAD status post CABG, no active angina at this time.  Continue aspirin, Plavix, and Lipitor.  Medication Adjustments/Labs and Tests Ordered: Current  medicines are reviewed at length with the patient today.  Concerns regarding medicines are outlined above.   Tests Ordered: Orders Placed This Encounter  Procedures  . Basic metabolic panel    Medication Changes: Meds ordered this encounter  Medications  . sacubitril-valsartan (ENTRESTO) 49-51 MG    Sig: Take 1 tablet by mouth 2 (two) times daily.    Dispense:  180 tablet    Refill:  2    11/14/2019 dose increase    Disposition:  Follow up 2 months in the [place] office.  Signed, Jonelle Sidle, MD, [facility] 11/14/2019 11:33 AM    [facility] [place] [address] [place], [place], [place] [address] Phone: [phone]; Fax: [phone]

## 2019-11-21 ENCOUNTER — Telehealth: Payer: Self-pay | Admitting: *Deleted

## 2019-11-21 DIAGNOSIS — I255 Ischemic cardiomyopathy: Secondary | ICD-10-CM | POA: Diagnosis not present

## 2019-11-21 NOTE — Telephone Encounter (Signed)
Patient informed. Copy sent to PCP °

## 2019-11-21 NOTE — Telephone Encounter (Signed)
Creatinine has increased from 1.3-1.5, but potassium is stable.  Continue with current medications and follow-up plan.

## 2019-12-04 ENCOUNTER — Ambulatory Visit: Payer: Medicare PPO | Admitting: Cardiology

## 2019-12-06 DIAGNOSIS — U071 COVID-19: Secondary | ICD-10-CM | POA: Diagnosis not present

## 2019-12-08 NOTE — Progress Notes (Signed)
Electrophysiology Office Note Date: 12/09/2019  ID:  Allen [name], DOB [birth year], MRN [id number]  PCP: Neale Burly, MD Primary Cardiologist: Rozann Lesches, MD Electrophysiologist: Thompson Grayer, MD   CC: Routine ICD follow-up  Allen [name] is a 86 y.o. male seen today for Thompson Grayer, MD for routine electrophysiology followup.  Since last being seen in our clinic the patient reports doing well. He is able to get around the house and do daily chores without SOB. He says he feels about the same, but this is a subjective improvement since last visit. Dr. Domenic Polite has also been adjusting medications. He denies chest pain, palpitations, PND, orthopnea, nausea, vomiting, dizziness, syncope, edema, weight gain, or early satiety. He has not had ICD shocks.   Device History: St. Jude BiV ICD implanted 07/17/2018 for ICM, NYHA III, SSS, LBBB History of appropriate therapy: No History of AAD therapy: No   Past Medical History:  Diagnosis Date   AICD (automatic cardioverter/defibrillator) present 07/17/2018   BIV   CAD (coronary artery disease)    a. 04/2016: s/p CABG with LIMA-LAD, SVG-D1, SVG-RCA, and seq SVG-OM1-OM2   Essential hypertension    Hyperlipidemia    Ischemic cardiomyopathy    LVEF 35% May 2016   LBBB (left bundle branch block)    Pneumonia due to COVID-19 virus    Sinus bradycardia    Type 2 diabetes mellitus ([place])    Past Surgical History:  Procedure Laterality Date   BIV ICD INSERTION CRT-D  07/17/2018   BIV ICD INSERTION CRT-D N/A 07/17/2018   Procedure: BIV ICD INSERTION CRT-D;  Surgeon: Thompson Grayer, MD;  Location: [place] CV LAB;  Service: Cardiovascular;  Laterality: N/A;   CARDIAC CATHETERIZATION N/A 11/02/2014   Procedure: Left Heart Cath and Coronary Angiography;  Surgeon: Belva Crome, MD;  Location: [place] CV LAB;  Service: Cardiovascular;  Laterality: N/A;   CARDIAC CATHETERIZATION N/A 05/03/2016   Procedure: Left Heart Cath  and Coronary Angiography;  Surgeon: Peter M Martinique, MD;  Location: [place] CV LAB;  Service: Cardiovascular;  Laterality: N/A;   CORONARY ARTERY BYPASS GRAFT N/A 05/08/2016   Procedure: CORONARY ARTERY BYPASS GRAFTING (CABG) x 5;  Surgeon: Gaye Pollack, MD;  Location: [place] OR;  Service: Open Heart Surgery;  Laterality: N/A;   ENDOVEIN HARVEST OF GREATER SAPHENOUS VEIN  05/08/2016   Procedure: ENDOVEIN HARVEST OF GREATER SAPHENOUS VEIN;  Surgeon: Gaye Pollack, MD;  Location: [place] OR;  Service: Open Heart Surgery;;   TEE WITHOUT CARDIOVERSION N/A 05/08/2016   Procedure: TRANSESOPHAGEAL ECHOCARDIOGRAM (TEE);  Surgeon: Gaye Pollack, MD;  Location: [place];  Service: Open Heart Surgery;  Laterality: N/A;    Current Outpatient Medications  Medication Sig Dispense Refill   aspirin 81 MG EC tablet Take 81 mg by mouth daily.     atorvastatin (LIPITOR) 80 MG tablet Take 1 tablet (80 mg total) by mouth daily at 6 PM. 90 tablet 3   carvedilol (COREG) 3.125 MG tablet TAKE 1 TABLET  TWO  TIMES DAILY. 180 tablet 2   cholecalciferol (VITAMIN D3) 25 MCG (1000 UNIT) tablet Take 1,000 Units by mouth daily.     clopidogrel (PLAVIX) 75 MG tablet TAKE 1 TABLET ONE TIME DAILY WITH BREAKFAST 90 tablet 3   furosemide (LASIX) 20 MG tablet Take 20 mg by mouth daily as needed. Weight gain of 2-3 lbs in a 24 hour period or 5 lbs in one week     Insulin Glargine (LANTUS  SOLOSTAR) 100 UNIT/ML Solostar Pen Inject 20 Units into the skin at bedtime. 15 mL 11   Insulin Lispro Junior KwikPen 100 UNIT/ML SOPN      mirtazapine (REMERON) 15 MG tablet Take 15 mg by mouth daily at 12 noon.      nitroGLYCERIN (NITROSTAT) 0.4 MG SL tablet Place 0.4 mg under the tongue every 5 (five) minutes as needed for chest pain (If no relief after 3rd dose, proceed to the ED).     OXYGEN 3 lpm 24/7 DME- [facility]     repaglinide (PRANDIN) 2 MG tablet Take 2 mg by mouth 2 (two) times daily before a meal.      sacubitril-valsartan  (ENTRESTO) 49-51 MG Take 1 tablet by mouth 2 (two) times daily. 180 tablet 2   vitamin B-12 (CYANOCOBALAMIN) 1000 MCG tablet      No current facility-administered medications for this visit.    Allergies:   Patient has no known allergies.   Social History: Social History   Socioeconomic History   Marital status: Married    Spouse name: Not on file   Number of children: Not on file   Years of education: Not on file   Highest education level: Not on file  Occupational History   Occupation: [name]    Comment: Retired  Tobacco Use   Smoking status: Former Smoker    Packs/day: 0.50    Years: 56.00    Pack years: 28.00    Types: Cigarettes    Start date: 02/18/1956    Quit date: 06/13/2011    Years since quitting: 8.4   Smokeless tobacco: Never Used  Vaping Use   Vaping Use: Never used  Substance and Sexual Activity   Alcohol use: No    Alcohol/week: 0.0 standard drinks   Drug use: No   Sexual activity: Never  Other Topics Concern   Not on file  Social History Narrative   Retired Naval architect, 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   Social Determinants of Corporate investment banker Strain:    Difficulty of Paying Living Expenses:   Food Insecurity:    Worried About Programme researcher, broadcasting/film/video in the Last Year:    Barista in the Last Year:   Transportation Needs:    Freight forwarder (Medical):    Lack of Transportation (Non-Medical):   Physical Activity:    Days of Exercise per Week:    Minutes of Exercise per Session:   Stress:    Feeling of Stress :   Social Connections:    Frequency of Communication with Friends and Family:    Frequency of Social Gatherings with Friends and Family:    Attends Religious Services:    Active Member of Clubs or Organizations:    Attends Engineer, structural:    Marital Status:   Intimate Partner Violence:    Fear of Current or Ex-Partner:    Emotionally Abused:    Physically Abused:     Sexually Abused:     Family History: Family History  Problem Relation Age of Onset   Unexplained death Father    Unexplained death Mother        Old age   Coronary artery disease Brother 37    Review of Systems: All other systems reviewed and are otherwise negative except as noted above.   Physical Exam: Vitals:   12/09/19 1009  BP: 120/72  Pulse: 60  SpO2: 96%  Weight: 174 lb (78.9 kg)  Height:  5\' 2"  (1.575 m)     GEN- The patient is well appearing, alert and oriented x 3 today.   HEENT: normocephalic, atraumatic; sclera clear, conjunctiva pink; hearing intact; oropharynx clear; neck supple, no JVP Lymph- no cervical lymphadenopathy Lungs- Clear to ausculation bilaterally, normal work of breathing.  No wheezes, rales, rhonchi Heart- Regular rate and rhythm, no murmurs, rubs or gallops, PMI not laterally displaced GI- soft, non-tender, non-distended, bowel sounds present, no hepatosplenomegaly Extremities- no clubbing, cyanosis, or edema; DP/PT/radial pulses 2+ bilaterally MS- no significant deformity or atrophy Skin- warm and dry, no rash or lesion; ICD pocket well healed Psych- euthymic mood, full affect Neuro- strength and sensation are intact  ICD interrogation- reviewed in detail today,  See PACEART report  EKG:  EKG is ordered today. The ekg ordered today shows AP- BiV pacing 60 bpm, QRS 182 ms, but negative in lead 1 and V1 positive. Shortest QRS has previously been >160.  Recent Labs: 08/11/2019: BUN 11; Creatinine, Ser 1.11; Potassium 3.8; Sodium 143 09/29/2019: B Natriuretic Peptide 1,057.0; Hemoglobin 13.2; Platelets 276; TSH 2.837   Wt Readings from Last 3 Encounters:  12/09/19 174 lb (78.9 kg)  11/14/19 176 lb 3.2 oz (79.9 kg)  10/31/19 173 lb (78.5 kg)     Other studies Reviewed: Additional studies/ records that were reviewed today include: Recent optimization note, previous EP office notes, gen cards office notes.    Assessment and Plan:  1.   Chronic systolic dysfunction s/p St. Jude CRT-D  euvolemic today Stable on an appropriate medical regimen, with adjustment on-going per Dr. 11/02/19 Normal ICD function See Diona Browner Art report No changes today S/p AV optimization 10/27/2019. He is feeling somewhat better in setting of this, as well as medication adjustment. Will see back in 3 months to consider further V-V optimization as able. (Will ask industry to assist at that visit and consider multisite pacing)  2. Sinus bradycardia Stable now s/p device implant  3. HTN Stable.  Current medicines are reviewed at length with the patient today.   The patient does not have concerns regarding his medicines.  The following changes were made today:  none  Labs/ tests ordered today include:  Orders Placed This Encounter  Procedures   Basic Metabolic Panel (BMET)   EKG 12-Lead   Disposition:   Follow up with EP APP in 3 months.   10/29/2019, PA-C  12/09/2019 11:00 AM  [facility] [address] [place] [phone] (office) [phone] (fax)

## 2019-12-09 ENCOUNTER — Ambulatory Visit (INDEPENDENT_AMBULATORY_CARE_PROVIDER_SITE_OTHER): Payer: Medicare PPO | Admitting: Student

## 2019-12-09 ENCOUNTER — Other Ambulatory Visit: Payer: Self-pay

## 2019-12-09 ENCOUNTER — Encounter: Payer: Self-pay | Admitting: Student

## 2019-12-09 VITALS — BP 120/72 | HR 60 | Ht 62.0 in | Wt 174.0 lb

## 2019-12-09 DIAGNOSIS — I10 Essential (primary) hypertension: Secondary | ICD-10-CM

## 2019-12-09 DIAGNOSIS — I5042 Chronic combined systolic (congestive) and diastolic (congestive) heart failure: Secondary | ICD-10-CM

## 2019-12-09 DIAGNOSIS — I255 Ischemic cardiomyopathy: Secondary | ICD-10-CM

## 2019-12-09 LAB — CUP PACEART INCLINIC DEVICE CHECK
Battery Remaining Longevity: 63 mo
Brady Statistic RA Percent Paced: 70 %
Brady Statistic RV Percent Paced: 99 %
Date Time Interrogation Session: 20210629105809
HighPow Impedance: 67.5 Ohm
HighPow Impedance: 67.5 Ohm
Implantable Lead Implant Date: 20200205
Implantable Lead Implant Date: 20200205
Implantable Lead Implant Date: 20200205
Implantable Lead Location: 753858
Implantable Lead Location: 753859
Implantable Lead Location: 753860
Implantable Pulse Generator Implant Date: 20200205
Lead Channel Impedance Value: 450 Ohm
Lead Channel Impedance Value: 450 Ohm
Lead Channel Impedance Value: 512.5 Ohm
Lead Channel Impedance Value: 512.5 Ohm
Lead Channel Impedance Value: 600 Ohm
Lead Channel Impedance Value: 600 Ohm
Lead Channel Pacing Threshold Amplitude: 0.5 V
Lead Channel Pacing Threshold Amplitude: 0.5 V
Lead Channel Pacing Threshold Amplitude: 0.75 V
Lead Channel Pacing Threshold Amplitude: 0.75 V
Lead Channel Pacing Threshold Amplitude: 0.75 V
Lead Channel Pacing Threshold Amplitude: 0.75 V
Lead Channel Pacing Threshold Amplitude: 1 V
Lead Channel Pacing Threshold Amplitude: 1 V
Lead Channel Pacing Threshold Amplitude: 1 V
Lead Channel Pacing Threshold Amplitude: 1 V
Lead Channel Pacing Threshold Pulse Width: 0.5 ms
Lead Channel Pacing Threshold Pulse Width: 0.5 ms
Lead Channel Pacing Threshold Pulse Width: 0.5 ms
Lead Channel Pacing Threshold Pulse Width: 0.5 ms
Lead Channel Pacing Threshold Pulse Width: 0.5 ms
Lead Channel Pacing Threshold Pulse Width: 0.5 ms
Lead Channel Pacing Threshold Pulse Width: 0.5 ms
Lead Channel Pacing Threshold Pulse Width: 0.5 ms
Lead Channel Pacing Threshold Pulse Width: 0.5 ms
Lead Channel Pacing Threshold Pulse Width: 0.5 ms
Lead Channel Sensing Intrinsic Amplitude: 12 mV
Lead Channel Sensing Intrinsic Amplitude: 12 mV
Lead Channel Sensing Intrinsic Amplitude: 3.8 mV
Lead Channel Sensing Intrinsic Amplitude: 3.8 mV
Lead Channel Setting Pacing Amplitude: 2 V
Lead Channel Setting Pacing Amplitude: 2 V
Lead Channel Setting Pacing Amplitude: 2 V
Lead Channel Setting Pacing Pulse Width: 0.5 ms
Lead Channel Setting Pacing Pulse Width: 0.5 ms
Lead Channel Setting Sensing Sensitivity: 0.5 mV
Pulse Gen Serial Number: 9880571

## 2019-12-09 LAB — BASIC METABOLIC PANEL
BUN/Creatinine Ratio: 10 (ref 10–24)
BUN: 17 mg/dL (ref 8–27)
CO2: 24 mmol/L (ref 20–29)
Calcium: 10.8 mg/dL — ABNORMAL HIGH (ref 8.6–10.2)
Chloride: 104 mmol/L (ref 96–106)
Creatinine, Ser: 1.71 mg/dL — ABNORMAL HIGH (ref 0.76–1.27)
GFR calc Af Amer: 42 mL/min/{1.73_m2} — ABNORMAL LOW (ref >59)
GFR calc non Af Amer: 36 mL/min/{1.73_m2} — ABNORMAL LOW (ref >59)
Glucose: 66 mg/dL (ref 65–99)
Potassium: 4.8 mmol/L (ref 3.5–5.2)
Sodium: 142 mmol/L (ref 134–144)

## 2019-12-09 NOTE — Patient Instructions (Addendum)
Medication Instructions:  *If you need a refill on your cardiac medications before your next appointment, please call your pharmacy*  Lab Work: Your physician has recommended that you have lab work today: BMET If you have labs (blood work) drawn today and your tests are completely normal, you will receive your results only by: Marland Kitchen MyChart Message (if you have MyChart) OR . A paper copy in the mail If you have any lab test that is abnormal or we need to change your treatment, we will call you to review the results.  Testing/Procedures: None Ordered  Follow-Up: At [facility], you and your health needs are our priority.  As part of our continuing mission to provide you with exceptional heart care, we have created designated Provider Care Teams.  These Care Teams include your primary Cardiologist (physician) and Advanced Practice Providers (APPs -  Physician Assistants and Nurse Practitioners) who all work together to provide you with the care you need, when you need it.  We recommend signing up for the patient portal called "MyChart".  Sign up information is provided on this After Visit Summary.  MyChart is used to connect with patients for Virtual Visits (Telemedicine).  Patients are able to view lab/test results, encounter notes, upcoming appointments, etc.  Non-urgent messages can be sent to your provider as well.   To learn more about what you can do with MyChart, go to [email or web address].    Your next appointment:   Your physician recommends that you schedule a follow-up appointment in: 3 MONTHS with [name], PA-C. - Tues. Sept 28,2021 at 10:05 am  Remote monitoring is used to monitor your ICD from home. This monitoring reduces the number of office visits required to check your device to one time per year. It allows [place] to keep an eye on the functioning of your device to ensure it is working properly. You are scheduled for a device check from home on 01/13/20. You may send your  transmission at any time that day. If you have a wireless device, the transmission will be sent automatically. After your physician reviews your transmission, you will receive a postcard with your next transmission date.  The format for your next appointment:   In person with [name], PA-C

## 2019-12-10 ENCOUNTER — Telehealth: Payer: Self-pay | Admitting: *Deleted

## 2019-12-10 DIAGNOSIS — I5042 Chronic combined systolic (congestive) and diastolic (congestive) heart failure: Secondary | ICD-10-CM

## 2019-12-10 MED ORDER — ENTRESTO 24-26 MG PO TABS: 1.0000 | ORAL_TABLET | Freq: Two times a day (BID) | ORAL | 2 refills | Status: DC

## 2019-12-10 NOTE — Telephone Encounter (Signed)
Patient informed and request that his niece Ella Jubilee be contacted with update. Ella Jubilee informed and verbalized understanding of plan. Lab order faxed to [facility].

## 2019-12-10 NOTE — Telephone Encounter (Signed)
-----   Message from Allen Sidle, MD sent at 12/10/2019  8:31 AM EDT ----- Thank you.  Creatinine 1.1 up to 1.7 on intermediate dose Entresto, potassium okay.  Looks like weight is stable per recent office visit with EP.  He is not on Aldactone at this point.  Using Lasix intermittently by report.  I will have my nurse check on him to see how he is feeling in general.  Maybe AV optimization has helped, I think unfortunately we should cut his Entresto back to prior dose for now and then recheck BMET in 7 to 10 days. ----- Message ----- From: Graciella Freer, Cordelia Poche Sent: 12/10/2019   8:24 AM EDT To: Allen Sidle, MD, Rebbeca Paul, CMA  AKI in the setting of recent Entresto increase, only using lasix prn. Can we double check how often he is taking lasix? May affect next steps.   Will also forward to Dr. Diona Browner for further.   Allen Needle [address] [name], Allen [name]  12/10/2019 8:24 AM

## 2019-12-11 ENCOUNTER — Encounter: Payer: Self-pay | Admitting: Internal Medicine

## 2019-12-11 ENCOUNTER — Other Ambulatory Visit: Payer: Self-pay

## 2019-12-11 ENCOUNTER — Ambulatory Visit (INDEPENDENT_AMBULATORY_CARE_PROVIDER_SITE_OTHER): Payer: Medicare PPO | Admitting: Internal Medicine

## 2019-12-11 DIAGNOSIS — R0600 Dyspnea, unspecified: Secondary | ICD-10-CM

## 2019-12-11 DIAGNOSIS — J9611 Chronic respiratory failure with hypoxia: Secondary | ICD-10-CM | POA: Diagnosis not present

## 2019-12-11 DIAGNOSIS — R0609 Other forms of dyspnea: Secondary | ICD-10-CM

## 2019-12-11 DIAGNOSIS — U071 COVID-19: Secondary | ICD-10-CM | POA: Diagnosis not present

## 2019-12-11 NOTE — Assessment & Plan Note (Addendum)
Onset March, 2015. : Sensation of shortness of breath at rest at  times and always when walking to the mailbox - much worse p covid  Dec 2020 and entresto d/c'd VQ low prob 06/23/19 (done for elevated d dimer)  - Echo 09/25/19 : Left ventricular ejection fraction, by estimation, is 35%. The left  ventricle has moderately decreased function. The left ventricle  demonstrates global hypokinesis. There is moderate left ventricular  hypertrophy. Left ventricular diastolic parameters  are consistent with Grade I diastolic dysfunction (impaired relaxation).  Elevated left atrial pressure.  2. The ventricular septum is flattened in diastole suggesting RV volume  overload. . Right ventricular systolic function was not well visualized.  The right ventricular size is not well visualized. There is moderately  elevated pulmonary artery systolic  pressure.  3. Left atrial size was mildly dilated.  4. Right atrial size was mildly dilated.  5. The mitral valve is abnormal. Mild mitral valve regurgitation. No  evidence of mitral stenosis.  6. Tricuspid valve regurgitation is mild to moderate.  7. The aortic valve is tricuspid. Aortic valve regurgitation is moderate.  Mild to moderate aortic valve stenosis.  8. There is mild to moderate pulmonary HTN, PASP is 37 mmHg.  9. The inferior vena cava is normal in size with greater than 50%  respiratory variability, suggesting right atrial pressure of 3 mmHg.  - entresto restarted 09/27/19   Clearly multifactorial = post covid lung dz, chf/ ? Copd  Now on symbicort ? If helps > ok to use pending f/u with pfts  12/11/2019  After extensive coaching inhaler device,  effectiveness =    75% (short TI)

## 2019-12-11 NOTE — Patient Instructions (Signed)
Work on inhaler technique:  relax and gently blow all the way out then take a nice smooth deep breath back in, triggering the inhaler at same time you start breathing in.  Hold for up to 5 seconds if you can. Blow out thru nose. Rinse and gargle with water when done     Stay on symbicort for now  Take 2 puffs first thing in am and then another 2 puffs about 12 hours later.    Make sure you check your oxygen saturations at highest level of activity to be sure it stays over 90% and adjust upward to maintain this level if needed but remember to turn it back to previous settings when you stop (to conserve your supply).    Please schedule a follow up office visit in 6-8  weeks, call sooner if needed with PFT's same day and cxr

## 2019-12-11 NOTE — Progress Notes (Signed)
Allen [name], male    DOB: 1933-09-18, 86 y.o.   MRN: [id number]   Brief patient profile:  85 yobm quit smoking 2013 without apparent sequelae then onset covid 19 xmas 2020 and admitted Jan 2021 and breathing hasn't been right since so referred to pulmonary clinic in [facility]  09/29/2019 by Dr   Diona Browner      History of Present Illness  09/29/2019  Pulmonary/ 1st office eval/Abdiel Blackerby  Chief Complaint  Patient presents with  . Pulmonary Consult    Referred by Dr [name]. Pt c/o SOB since had covid Dec 2020.   Dyspnea:  Breathing about the same as at d/c from [facility] can walk flat for 3-4 min does not check while walking Cough: not much , thick mucus from nose  Sleep: bed is flat / 2 pillows  SABA use:  02  3 concentrator and 3lpm on portable system does not titrate rec Will send order to [facility] to be sure your oxygen is humdified Please remember to go to the lab department   for your tests - we will call you with the results when they are available - ok to wait 10 days as per Dr McDowell's instructions  Make sure you check your oxygen saturations at highest level of activity to be sure it stays over 90% and adjust upward to maintain this level if needed but remember to turn it back to previous settings when you stop (to conserve your supply).    10/31/2019  f/u ov/Ioana Louks re: doe / 02 dep since covid, got 1st vaccine ? Loss adjuster, chartered Complaint  Patient presents with  . Follow-up    Breathing has improved since the last visit. No cough or wheezing.   Dyspnea:  Walk in from [facility] parking on 3lpm portable tank , can do one aisle at grocery store s variability  Cough: none Sleeping: bed is flat 2 pillows  SABA use: no inhalers 02:   3lpm 24/7  rec Make sure you check your oxygen saturations at highest level of activity to be sure it stays over 90%   Office visit in 6 weeks, call sooner if needed with all medications    12/11/2019  f/u ov/Vikki Gains re: sob x 2015 / 02  only since covid with   2nd shot May 25 21 - now on symbicort ? strength Chief Complaint  Patient presents with  . Follow-up    Breathing is overall doing well today.    Dyspnea:  Maybe 2 aisles at [employer] / walking at park some but not monitoring sats as rec  Cough: none  Sleeping: flat bed 2 pillows  SABA use: none now on symbicort 160 2bid 02: 3lpm conc and portable  = 2 lpm does not titrate    No obvious day to day or daytime variability or assoc excess/ purulent sputum or mucus plugs or hemoptysis or cp or chest tightness, subjective wheeze or overt sinus or hb symptoms.   sleeping  without nocturnal  or early am exacerbation  of respiratory  c/o's or need for noct saba. Also denies any obvious fluctuation of symptoms with weather or environmental changes or other aggravating or alleviating factors except as outlined above   No unusual exposure hx or h/o childhood pna/ asthma or knowledge of premature birth.  Current Allergies, Complete Past Medical History, Past Surgical History, Family History, and Social History were reviewed in [employer] record.  ROS  The following are not active complaints  unless bolded Hoarseness, sore throat, dysphagia, dental problems, itching, sneezing,  nasal congestion or discharge of excess mucus or purulent secretions, ear ache,   fever, chills, sweats, unintended wt loss or wt gain, classically pleuritic or exertional cp,  orthopnea pnd or arm/hand swelling  or leg swelling, presyncope, palpitations, abdominal pain, anorexia, nausea, vomiting, diarrhea  or change in bowel habits or change in bladder habits, change in stools or change in urine, dysuria, hematuria,  rash, arthralgias, visual complaints, headache, numbness, weakness or ataxia or problems with walking or coordination,  change in mood or  memory.        Current Meds  Medication Sig  . aspirin 81 MG EC tablet Take 81 mg by mouth daily.  Marland Kitchen atorvastatin (LIPITOR) 80 MG  tablet Take 1 tablet (80 mg total) by mouth daily at 6 PM.  . budesonide-formoterol (SYMBICORT) 160-4.5 MCG/ACT inhaler Inhale 2 puffs into the lungs 2 (two) times daily.  . carvedilol (COREG) 3.125 MG tablet TAKE 1 TABLET  TWO  TIMES DAILY.  . cholecalciferol (VITAMIN D3) 25 MCG (1000 UNIT) tablet Take 1,000 Units by mouth daily.  . clopidogrel (PLAVIX) 75 MG tablet TAKE 1 TABLET ONE TIME DAILY WITH BREAKFAST  . furosemide (LASIX) 20 MG tablet Take 20 mg by mouth daily as needed. Weight gain of 2-3 lbs in a 24 hour period or 5 lbs in one week  . Insulin Glargine (LANTUS SOLOSTAR) 100 UNIT/ML Solostar Pen Inject 20 Units into the skin at bedtime.  . Insulin Lispro Junior KwikPen 100 UNIT/ML SOPN   . mirtazapine (REMERON) 15 MG tablet Take 15 mg by mouth daily at 12 noon.   . nitroGLYCERIN (NITROSTAT) 0.4 MG SL tablet Place 0.4 mg under the tongue every 5 (five) minutes as needed for chest pain (If no relief after 3rd dose, proceed to the ED).  . OXYGEN 3 lpm 24/7 DME- [facility]  . repaglinide (PRANDIN) 2 MG tablet Take 2 mg by mouth 2 (two) times daily before a meal.   . sacubitril-valsartan (ENTRESTO) 24-26 MG Take 1 tablet by mouth 2 (two) times daily.  . vitamin B-12 (CYANOCOBALAMIN) 1000 MCG tablet                     Past Medical History:  Diagnosis Date  . AICD (automatic cardioverter/defibrillator) present 07/17/2018   BIV  . CAD (coronary artery disease)    a. 04/2016: s/p CABG with LIMA-LAD, SVG-D1, SVG-RCA, and seq SVG-OM1-OM2  . Essential hypertension   . Hyperlipidemia   . Ischemic cardiomyopathy    LVEF 35% May 2016  . LBBB (left bundle branch block)   . Pneumonia due to COVID-19 virus   . Sinus bradycardia   . Type 2 diabetes mellitus (HCC)        Objective:      12/11/2019         176   10/31/19 173 lb (78.5 kg)  09/29/19 171 lb (77.6 kg)  09/02/19 170 lb (77.1 kg)    amb bm nad   Vital signs reviewed  12/11/2019  - Note at rest 02 sats  98% on 2lpm cont    HEENT : pt wearing mask not removed for exam due to covid - 19 concerns.   NECK :  without JVD/Nodes/TM/ nl carotid upstrokes bilaterally   LUNGS: no acc muscle use,  Min barrel  contour chest wall with bilateral  slightly decreased bs s audible wheeze and  without cough on insp or exp maneuvers  and min  Hyperresonant  to  percussion bilaterally     CV:  RRR  no s3  III/VI sem s  increase in P2, and no edema   ABD:  soft and nontender with pos end  insp Hoover's  in the supine position. No bruits or organomegaly appreciated, bowel sounds nl  MS:   Nl gait/  ext warm without deformities, calf tenderness, cyanosis or clubbing No obvious joint restrictions   SKIN: warm and dry without lesions    NEURO:  alert, approp, nl sensorium with  no motor or cerebellar deficits apparent.                       Assessment

## 2019-12-12 ENCOUNTER — Encounter: Payer: Self-pay | Admitting: Internal Medicine

## 2019-12-12 NOTE — Assessment & Plan Note (Signed)
Onset at Covid 19 pna 06/2019  - 09/29/2019  sat on RA at rest was 80%-- pt placed on o2 3lpm and sats increased to 93%--walked up hallway x 1 and sats decreased to 84%--o2 increased to 4 lpm-- sats increased to 90%-- walked another lap and sats decreased to 88%--increased to 5lpm and walked another lap with sats ending at 90%//lmr -  10/31/2019   Walked RA  approx   300 ft  @ avg pace/unsteady pace  stopped due to  Sob and sats down to 88% on 5lpm continuous   -  12/11/2019   Walked after desat to 87% 2lpm pt was placed on 3lpm cont o2 and sats increased to 95% and remained 95% for 100 ft   Again rec 2lpm at rest and at least 3 lpm walking but Make sure you check your oxygen saturations at highest level of activity to be sure it stays over 90% and adjust upward to maintain this level if needed but remember to turn it back to previous settings when you stop (to conserve your supply).           Each maintenance medication was reviewed in detail including emphasizing most importantly the difference between maintenance and prns and under what circumstances the prns are to be triggered using an action plan format where appropriate.  Total time for H and P, chart review, counseling, teaching device (hfa)  directly observing portions of ambulatory 02 saturation study/  and generating customized AVS unique to this office visit / charting = 30 min

## 2019-12-17 DIAGNOSIS — I10 Essential (primary) hypertension: Secondary | ICD-10-CM | POA: Diagnosis not present

## 2019-12-17 DIAGNOSIS — N1831 Chronic kidney disease, stage 3a: Secondary | ICD-10-CM | POA: Diagnosis not present

## 2019-12-17 DIAGNOSIS — K219 Gastro-esophageal reflux disease without esophagitis: Secondary | ICD-10-CM | POA: Diagnosis not present

## 2019-12-17 DIAGNOSIS — I5022 Chronic systolic (congestive) heart failure: Secondary | ICD-10-CM | POA: Diagnosis not present

## 2019-12-17 DIAGNOSIS — E1121 Type 2 diabetes mellitus with diabetic nephropathy: Secondary | ICD-10-CM | POA: Diagnosis not present

## 2019-12-26 DIAGNOSIS — I5042 Chronic combined systolic (congestive) and diastolic (congestive) heart failure: Secondary | ICD-10-CM | POA: Diagnosis not present

## 2019-12-31 ENCOUNTER — Telehealth: Payer: Self-pay | Admitting: *Deleted

## 2019-12-31 NOTE — Telephone Encounter (Signed)
Patient informed. Copy sent to PCP °

## 2019-12-31 NOTE — Telephone Encounter (Signed)
-----   Message from Jonelle Sidle, MD sent at 12/30/2019 10:05 AM EDT ----- Results reviewed.  Creatinine remains elevated at 1.84 following decrease in Entresto dose (previously 1.7).  Potassium is normal at 4.5.  Would not plan on trying to reinitiate Aldactone at all.  Keep scheduled follow-up visit and would repeat BMET at that time.

## 2020-01-05 ENCOUNTER — Other Ambulatory Visit (HOSPITAL_COMMUNITY)
Admission: RE | Admit: 2020-01-05 | Discharge: 2020-01-05 | Disposition: A | Discharge status: Home / Self Care | Payer: Medicare PPO | Source: Ambulatory Visit | Attending: Internal Medicine | Admitting: Internal Medicine

## 2020-01-05 ENCOUNTER — Other Ambulatory Visit (HOSPITAL_COMMUNITY): Payer: Medicare PPO

## 2020-01-05 ENCOUNTER — Other Ambulatory Visit: Payer: Self-pay

## 2020-01-05 DIAGNOSIS — Z20822 Contact with and (suspected) exposure to covid-19: Secondary | ICD-10-CM | POA: Diagnosis not present

## 2020-01-05 DIAGNOSIS — Z01812 Encounter for preprocedural laboratory examination: Secondary | ICD-10-CM | POA: Insufficient documentation

## 2020-01-05 DIAGNOSIS — U071 COVID-19: Secondary | ICD-10-CM | POA: Diagnosis not present

## 2020-01-05 LAB — SARS CORONAVIRUS 2 (TAT 6-24 HRS): SARS Coronavirus 2: NEGATIVE

## 2020-01-07 ENCOUNTER — Ambulatory Visit (HOSPITAL_COMMUNITY): Admission: RE | Admit: 2020-01-07 | Payer: Medicare PPO | Source: Ambulatory Visit

## 2020-01-08 ENCOUNTER — Telehealth: Payer: Self-pay | Admitting: Internal Medicine

## 2020-01-08 NOTE — Telephone Encounter (Signed)
Spoke to pt's niece & told her Resp Therapy will reschedule PFT.  Gave her their phone # & told her once pft has been rescheduled we will call him with covid test appt.  Transferred her to Resp Therapy.  Nothing further needed.

## 2020-01-08 NOTE — Telephone Encounter (Signed)
Left vm for pt's niece to call me.

## 2020-01-11 DIAGNOSIS — U071 COVID-19: Secondary | ICD-10-CM | POA: Diagnosis not present

## 2020-01-13 ENCOUNTER — Ambulatory Visit (INDEPENDENT_AMBULATORY_CARE_PROVIDER_SITE_OTHER): Payer: Medicare PPO | Admitting: *Deleted

## 2020-01-13 DIAGNOSIS — I255 Ischemic cardiomyopathy: Secondary | ICD-10-CM

## 2020-01-14 LAB — CUP PACEART REMOTE DEVICE CHECK
Battery Remaining Longevity: 63 mo
Battery Remaining Percentage: 74 %
Battery Voltage: 2.98 V
Brady Statistic AP VP Percent: 72 %
Brady Statistic AP VS Percent: 1 %
Brady Statistic AS VP Percent: 27 %
Brady Statistic AS VS Percent: 1 %
Brady Statistic RA Percent Paced: 71 %
Date Time Interrogation Session: 20210804020015
HighPow Impedance: 57 Ohm
HighPow Impedance: 57 Ohm
Implantable Lead Implant Date: 20200205
Implantable Lead Implant Date: 20200205
Implantable Lead Implant Date: 20200205
Implantable Lead Location: 753858
Implantable Lead Location: 753859
Implantable Lead Location: 753860
Implantable Pulse Generator Implant Date: 20200205
Lead Channel Impedance Value: 430 Ohm
Lead Channel Impedance Value: 480 Ohm
Lead Channel Impedance Value: 580 Ohm
Lead Channel Pacing Threshold Amplitude: 0.625 V
Lead Channel Pacing Threshold Amplitude: 0.75 V
Lead Channel Pacing Threshold Amplitude: 1 V
Lead Channel Pacing Threshold Pulse Width: 0.5 ms
Lead Channel Pacing Threshold Pulse Width: 0.5 ms
Lead Channel Pacing Threshold Pulse Width: 0.5 ms
Lead Channel Sensing Intrinsic Amplitude: 12 mV
Lead Channel Sensing Intrinsic Amplitude: 3.6 mV
Lead Channel Setting Pacing Amplitude: 2 V
Lead Channel Setting Pacing Amplitude: 2 V
Lead Channel Setting Pacing Amplitude: 2 V
Lead Channel Setting Pacing Pulse Width: 0.5 ms
Lead Channel Setting Pacing Pulse Width: 0.5 ms
Lead Channel Setting Sensing Sensitivity: 0.5 mV
Pulse Gen Serial Number: 9880571

## 2020-01-15 ENCOUNTER — Ambulatory Visit (INDEPENDENT_AMBULATORY_CARE_PROVIDER_SITE_OTHER): Payer: Medicare PPO | Admitting: Cardiology

## 2020-01-15 ENCOUNTER — Encounter: Payer: Self-pay | Admitting: Cardiology

## 2020-01-15 VITALS — BP 130/86 | HR 64 | Ht 62.0 in | Wt 178.0 lb

## 2020-01-15 DIAGNOSIS — I5042 Chronic combined systolic (congestive) and diastolic (congestive) heart failure: Secondary | ICD-10-CM | POA: Diagnosis not present

## 2020-01-15 DIAGNOSIS — I255 Ischemic cardiomyopathy: Secondary | ICD-10-CM | POA: Diagnosis not present

## 2020-01-15 DIAGNOSIS — I25119 Atherosclerotic heart disease of native coronary artery with unspecified angina pectoris: Secondary | ICD-10-CM | POA: Diagnosis not present

## 2020-01-15 NOTE — Patient Instructions (Addendum)
Medication Instructions:   Your physician recommends that you continue on your current medications as directed. Please refer to the Current Medication list given to you today.  Labwork:  Your physician recommends that you return for non-fasting lab work in: 4 months just before your next visit to check your BMET. This may be done at [facility] or [facility] [facility] or [employer] ([address] [place]) Monday-Friday from 8:00 am - 4:00 pm. No appointment is needed.  Testing/Procedures:  NONE  Follow-Up:  Your physician recommends that you schedule a follow-up appointment in: 4 months.   Any Other Special Instructions Will Be Listed Below (If Applicable).  If you need a refill on your cardiac medications before your next appointment, please call your pharmacy.

## 2020-01-15 NOTE — Progress Notes (Signed)
Remote ICD transmission.   

## 2020-01-15 NOTE — Progress Notes (Signed)
Cardiology Office Note  Date: 01/15/2020   ID: Allen [name], DOB [birth year], MRN [id number]  PCP:  [name], MD  Cardiologist:  [name], MD Electrophysiologist:  Hillis Range, MD   Chief Complaint  Patient presents with  . Cardiac follow-up    History of Present Illness: Allen [name] is an 86 y.o. male last seen in June.  He is here today with family member for a routine visit.  He does not report any major change in status, breathing some what better in general.  He is wearing supplemental oxygen today, he states that he uses this as needed.  His weight is up 2 pounds, he does not report any orthopnea or PND, no leg swelling.  He continues to follow with Dr. Johney Frame, St. Jude biventricular ICD in place with CRT optimization.  Recent lab work from July showed potassium 4.5, BUN 20, and creatinine 1.84. We had to down titrate Entresto and have held off on Aldactone in light of fluctuating renal insufficiency.  I reviewed his present regimen which is outlined below.  Past Medical History:  Diagnosis Date  . AICD (automatic cardioverter/defibrillator) present 07/17/2018   BIV  . CAD (coronary artery disease)    a. 04/2016: s/p CABG with LIMA-LAD, SVG-D1, SVG-RCA, and seq SVG-OM1-OM2  . Essential hypertension   . Hyperlipidemia   . Ischemic cardiomyopathy    LVEF 35% May 2016  . LBBB (left bundle branch block)   . Pneumonia due to COVID-19 virus   . Sinus bradycardia   . Type 2 diabetes mellitus (HCC)     Past Surgical History:  Procedure Laterality Date  . BIV ICD INSERTION CRT-D  07/17/2018  . BIV ICD INSERTION CRT-D N/A 07/17/2018   Procedure: BIV ICD INSERTION CRT-D;  Surgeon: Hillis Range, MD;  Location: [facility] INVASIVE CV LAB;  Service: Cardiovascular;  Laterality: N/A;  . CARDIAC CATHETERIZATION N/A 11/02/2014   Procedure: Left Heart Cath and Coronary Angiography;  Surgeon: Lyn Records, MD;  Location: [facility] INVASIVE CV LAB;  Service: Cardiovascular;   Laterality: N/A;  . CARDIAC CATHETERIZATION N/A 05/03/2016   Procedure: Left Heart Cath and Coronary Angiography;  Surgeon: Peter M Swaziland, MD;  Location: [facility] INVASIVE CV LAB;  Service: Cardiovascular;  Laterality: N/A;  . CORONARY ARTERY BYPASS GRAFT N/A 05/08/2016   Procedure: CORONARY ARTERY BYPASS GRAFTING (CABG) x 5;  Surgeon: Alleen Borne, MD;  Location: [facility] OR;  Service: Open Heart Surgery;  Laterality: N/A;  . ENDOVEIN HARVEST OF GREATER SAPHENOUS VEIN  05/08/2016   Procedure: ENDOVEIN HARVEST OF GREATER SAPHENOUS VEIN;  Surgeon: Alleen Borne, MD;  Location: [facility] OR;  Service: Open Heart Surgery;;  . TEE WITHOUT CARDIOVERSION N/A 05/08/2016   Procedure: TRANSESOPHAGEAL ECHOCARDIOGRAM (TEE);  Surgeon: Alleen Borne, MD;  Location[facility] OR;  Service: Open Heart Surgery;  Laterality: N/A;    Current Outpatient Medications  Medication Sig Dispense Refill  . aspirin 81 MG EC tablet Take 81 mg by mouth daily.    Marland Kitchen atorvastatin (LIPITOR) 80 MG tablet Take 1 tablet (80 mg total) by mouth daily at 6 PM. 90 tablet 3  . budesonide-formoterol (SYMBICORT) 160-4.5 MCG/ACT inhaler Inhale 2 puffs into the lungs 2 (two) times daily.    . carvedilol (COREG) 3.125 MG tablet TAKE 1 TABLET  TWO  TIMES DAILY. 180 tablet 2  . cholecalciferol (VITAMIN D3) 25 MCG (1000 UNIT) tablet Take 1,000 Units by mouth daily.    . clopidogrel (PLAVIX) 75 MG  tablet TAKE 1 TABLET ONE TIME DAILY WITH BREAKFAST 90 tablet 3  . furosemide (LASIX) 20 MG tablet Take 20 mg by mouth daily as needed. Weight gain of 2-3 lbs in a 24 hour period or 5 lbs in one week    . Insulin Glargine (LANTUS SOLOSTAR) 100 UNIT/ML Solostar Pen Inject 20 Units into the skin at bedtime. 15 mL 11  . Insulin Lispro Junior KwikPen 100 UNIT/ML SOPN     . mirtazapine (REMERON) 15 MG tablet Take 15 mg by mouth daily at 12 noon.     . nitroGLYCERIN (NITROSTAT) 0.4 MG SL tablet Place 0.4 mg under the tongue every 5 (five) minutes as needed for chest pain (If  no relief after 3rd dose, proceed to the ED).    . OXYGEN 3 lpm 24/7 DME- [facility]    . repaglinide (PRANDIN) 2 MG tablet Take 2 mg by mouth 2 (two) times daily before a meal.     . sacubitril-valsartan (ENTRESTO) 24-26 MG Take 1 tablet by mouth 2 (two) times daily. 180 tablet 2  . vitamin B-12 (CYANOCOBALAMIN) 1000 MCG tablet      No current facility-administered medications for this visit.   Allergies:  Patient has no known allergies.   ROS:  No syncope.  Physical Exam: VS:  BP 130/86   Pulse 64   Ht 5\' 2"  (1.575 m)   Wt 178 lb (80.7 kg)   SpO2 90%   BMI 32.56 kg/m , BMI Body mass index is 32.56 kg/m.  Wt Readings from Last 3 Encounters:  01/15/20 178 lb (80.7 kg)  12/11/19 176 lb (79.8 kg)  12/09/19 174 lb (78.9 kg)    General: Elderly male, appears comfortable at rest.  Wearing oxygen via nasal cannula. HEENT: Conjunctiva and lids normal, wearing a mask. Neck: Supple, no elevated JVP or carotid bruits, no thyromegaly. Lungs: Decreased breath sounds, nonlabored breathing at rest. Cardiac: Regular rate and rhythm, no S3, 2/6 systolic murmur, no pericardial rub. Extremities: No pitting edema, distal pulses 2+.  ECG:  An ECG dated 12/09/2019 was personally reviewed today and demonstrated:  Dual-chamber pacing.  Recent Labwork: 09/29/2019: B Natriuretic Peptide 1,057.0; Hemoglobin 13.2; Platelets 276; TSH 2.837 12/09/2019: BUN 17; Creatinine, Ser 1.71; Potassium 4.8; Sodium 142     Component Value Date/Time   CHOL 170 11/02/2014 0213   TRIG 121 11/02/2014 0213   HDL 45 11/02/2014 0213   CHOLHDL 3.8 11/02/2014 0213   VLDL 24 11/02/2014 0213   LDLCALC 101 (H) 11/02/2014 0213    Other Studies Reviewed Today:  Echocardiogram 09/25/2019: 1. Left ventricular ejection fraction, by estimation, is 35%. The left  ventricle has moderately decreased function. The left ventricle  demonstrates global hypokinesis. There is moderate left ventricular  hypertrophy. Left ventricular  diastolic parameters  are consistent with Grade I diastolic dysfunction (impaired relaxation).  Elevated left atrial pressure.  2. The ventricular septum is flattened in diastole suggesting RV volume  overload. . Right ventricular systolic function was not well visualized.  The right ventricular size is not well visualized. There is moderately  elevated pulmonary artery systolic  pressure.  3. Left atrial size was mildly dilated.  4. Right atrial size was mildly dilated.  5. The mitral valve is abnormal. Mild mitral valve regurgitation. No  evidence of mitral stenosis.  6. Tricuspid valve regurgitation is mild to moderate.  7. The aortic valve is tricuspid. Aortic valve regurgitation is moderate.  Mild to moderate aortic valve stenosis.  8. There is mild  to moderate pulmonary HTN, PASP is 37 mmHg.  9. The inferior vena cava is normal in size with greater than 50%  respiratory variability, suggesting right atrial pressure of 3 mmHg.   Assessment and Plan:  1.  Ischemic cardiomyopathy with LVEF approximately 35% and chronic combined heart failure.  He has been clinically stable, weight is up 2 pounds, but no leg swelling.  Continue Coreg, Entresto, and Lasix.  Due to fluctuating renal insufficiency we were not able to [facility], and will hold off on Aldactone.  Check BMET for next visit.  2.  St. Jude biventricular ICD in place with CRT optimization under the direction of Dr. Johney Frame.  No device shocks or syncope.  3.  CAD status post CABG.  He does not report any active angina at this time.  Continue aspirin, Plavix, and Lipitor.  He has nitroglycerin available as needed.  Medication Adjustments/Labs and Tests Ordered: Current medicines are reviewed at length with the patient today.  Concerns regarding medicines are outlined above.   Tests Ordered: Orders Placed This Encounter  Procedures  . Basic metabolic panel    Medication Changes: No orders of the defined  types were placed in this encounter.   Disposition:  Follow up 4 months in the [place] office.  Signed, Jonelle Sidle, MD, [facility] 01/15/2020 11:54 AM    [facility] [address] [place], [place], [place] [address] Phone: [phone]; Fax: [phone]

## 2020-01-19 ENCOUNTER — Ambulatory Visit (INDEPENDENT_AMBULATORY_CARE_PROVIDER_SITE_OTHER): Payer: Medicare PPO | Admitting: *Deleted

## 2020-01-19 DIAGNOSIS — I255 Ischemic cardiomyopathy: Secondary | ICD-10-CM

## 2020-01-21 LAB — CUP PACEART REMOTE DEVICE CHECK
Battery Remaining Longevity: 64 mo
Battery Remaining Percentage: 74 %
Battery Voltage: 2.98 V
Brady Statistic AP VP Percent: 73 %
Brady Statistic AP VS Percent: 1 %
Brady Statistic AS VP Percent: 26 %
Brady Statistic AS VS Percent: 1 %
Brady Statistic RA Percent Paced: 72 %
Date Time Interrogation Session: 20210809135509
HighPow Impedance: 70 Ohm
HighPow Impedance: 70 Ohm
Implantable Lead Implant Date: 20200205
Implantable Lead Implant Date: 20200205
Implantable Lead Implant Date: 20200205
Implantable Lead Location: 753858
Implantable Lead Location: 753859
Implantable Lead Location: 753860
Implantable Pulse Generator Implant Date: 20200205
Lead Channel Impedance Value: 430 Ohm
Lead Channel Impedance Value: 490 Ohm
Lead Channel Impedance Value: 590 Ohm
Lead Channel Pacing Threshold Amplitude: 0.625 V
Lead Channel Pacing Threshold Amplitude: 0.75 V
Lead Channel Pacing Threshold Amplitude: 1 V
Lead Channel Pacing Threshold Pulse Width: 0.5 ms
Lead Channel Pacing Threshold Pulse Width: 0.5 ms
Lead Channel Pacing Threshold Pulse Width: 0.5 ms
Lead Channel Sensing Intrinsic Amplitude: 12 mV
Lead Channel Sensing Intrinsic Amplitude: 4 mV
Lead Channel Setting Pacing Amplitude: 2 V
Lead Channel Setting Pacing Amplitude: 2 V
Lead Channel Setting Pacing Amplitude: 2 V
Lead Channel Setting Pacing Pulse Width: 0.5 ms
Lead Channel Setting Pacing Pulse Width: 0.5 ms
Lead Channel Setting Sensing Sensitivity: 0.5 mV
Pulse Gen Serial Number: 9880571

## 2020-01-22 DIAGNOSIS — R42 Dizziness and giddiness: Secondary | ICD-10-CM | POA: Diagnosis not present

## 2020-01-22 DIAGNOSIS — R9431 Abnormal electrocardiogram [ECG] [EKG]: Secondary | ICD-10-CM | POA: Diagnosis not present

## 2020-01-22 DIAGNOSIS — Z79899 Other long term (current) drug therapy: Secondary | ICD-10-CM | POA: Diagnosis not present

## 2020-01-22 DIAGNOSIS — Z7982 Long term (current) use of aspirin: Secondary | ICD-10-CM | POA: Diagnosis not present

## 2020-01-22 DIAGNOSIS — Z87891 Personal history of nicotine dependence: Secondary | ICD-10-CM | POA: Diagnosis not present

## 2020-01-22 DIAGNOSIS — Z794 Long term (current) use of insulin: Secondary | ICD-10-CM | POA: Diagnosis not present

## 2020-01-22 NOTE — Progress Notes (Signed)
Remote ICD transmission.   

## 2020-01-27 ENCOUNTER — Telehealth: Payer: Self-pay | Admitting: Internal Medicine

## 2020-01-27 NOTE — Telephone Encounter (Signed)
Called and spoke to [facility] [facility]. Pt was never scheduled for the COVID test prior to his PFT. Pt has a PFT on 8/20 and a follow up with Dr. Sherene Sires on 8/27.  [facility] please schedule COVID test for [facility] for 8/18. Lauren advised this can be done and results will be back in 2 days.

## 2020-01-27 NOTE — Telephone Encounter (Signed)
I scheduled covid test for tomorrow & have left a vm for pt and his niece to call me for appt info.  I made them aware test will have to be tomorrow for him to have pft on Friday.

## 2020-01-28 ENCOUNTER — Other Ambulatory Visit (HOSPITAL_COMMUNITY): Payer: Medicare PPO

## 2020-01-28 NOTE — Telephone Encounter (Signed)
lmtcb for pt.   PCCs, I do not see where pt is scheduled for COVID test. Can you help with this?

## 2020-01-28 NOTE — Telephone Encounter (Signed)
Pt's niece called & states she is working today and is not able to take him for covid test today.  I told her scheduler for pft's is off today but should call her tomorrow to reschedule pft & then I would reschedule covid test.  She also states pt is about the same so will reschedule appt with MW to after pft.  I also explained pft can be done in our office but they prefer to go to [facility]. I called Resp Therapy & left vm for pft to be rescheduled.  I canceled covid test that I scheduled & will call niece after pft rescheduled.  Nothing further needed at this time.

## 2020-01-30 ENCOUNTER — Inpatient Hospital Stay (HOSPITAL_COMMUNITY)
Admission: RE | Admit: 2020-01-30 | Discharge: 2020-01-30 | Disposition: A | Discharge status: Home / Self Care | Payer: Medicare PPO | Source: Ambulatory Visit | Attending: Internal Medicine | Admitting: Internal Medicine

## 2020-01-30 ENCOUNTER — Other Ambulatory Visit (HOSPITAL_COMMUNITY)
Admission: RE | Admit: 2020-01-30 | Discharge: 2020-01-30 | Disposition: A | Discharge status: Home / Self Care | Payer: Medicare PPO | Source: Ambulatory Visit | Attending: Internal Medicine | Admitting: Internal Medicine

## 2020-01-30 ENCOUNTER — Other Ambulatory Visit: Payer: Self-pay

## 2020-01-30 DIAGNOSIS — H18413 Arcus senilis, bilateral: Secondary | ICD-10-CM | POA: Diagnosis not present

## 2020-01-30 DIAGNOSIS — Z01812 Encounter for preprocedural laboratory examination: Secondary | ICD-10-CM | POA: Diagnosis not present

## 2020-01-30 DIAGNOSIS — Z20822 Contact with and (suspected) exposure to covid-19: Secondary | ICD-10-CM | POA: Diagnosis not present

## 2020-01-30 DIAGNOSIS — H2513 Age-related nuclear cataract, bilateral: Secondary | ICD-10-CM | POA: Diagnosis not present

## 2020-01-30 DIAGNOSIS — H11153 Pinguecula, bilateral: Secondary | ICD-10-CM | POA: Diagnosis not present

## 2020-01-30 DIAGNOSIS — H43813 Vitreous degeneration, bilateral: Secondary | ICD-10-CM | POA: Diagnosis not present

## 2020-01-30 DIAGNOSIS — H524 Presbyopia: Secondary | ICD-10-CM | POA: Diagnosis not present

## 2020-01-30 DIAGNOSIS — H52223 Regular astigmatism, bilateral: Secondary | ICD-10-CM | POA: Diagnosis not present

## 2020-01-30 DIAGNOSIS — H5203 Hypermetropia, bilateral: Secondary | ICD-10-CM | POA: Diagnosis not present

## 2020-01-30 DIAGNOSIS — E113293 Type 2 diabetes mellitus with mild nonproliferative diabetic retinopathy without macular edema, bilateral: Secondary | ICD-10-CM | POA: Diagnosis not present

## 2020-01-30 LAB — SARS CORONAVIRUS 2 (TAT 6-24 HRS): SARS Coronavirus 2: NEGATIVE

## 2020-02-03 ENCOUNTER — Ambulatory Visit (HOSPITAL_COMMUNITY)
Admission: RE | Admit: 2020-02-03 | Discharge: 2020-02-03 | Disposition: A | Discharge status: Home / Self Care | Payer: Medicare PPO | Source: Ambulatory Visit | Attending: Internal Medicine | Admitting: Internal Medicine

## 2020-02-03 DIAGNOSIS — R0609 Other forms of dyspnea: Secondary | ICD-10-CM

## 2020-02-03 DIAGNOSIS — R0600 Dyspnea, unspecified: Secondary | ICD-10-CM | POA: Diagnosis not present

## 2020-02-03 LAB — PULMONARY FUNCTION TEST
DL/VA % pred: 64 %
DL/VA: 2.56 ml/min/mmHg/L
DLCO unc % pred: 45 %
DLCO unc: 8.42 ml/min/mmHg
FEF 25-75 Post: 2.3 L/sec
FEF 25-75 Pre: 3.61 L/sec
FEF2575-%Change-Post: -36 %
FEF2575-%Pred-Post: 187 %
FEF2575-%Pred-Pre: 295 %
FEV1-%Change-Post: -3 %
FEV1-%Pred-Post: 109 %
FEV1-%Pred-Pre: 113 %
FEV1-Post: 1.86 L
FEV1-Pre: 1.92 L
FEV1FVC-%Change-Post: 0 %
FEV1FVC-%Pred-Pre: 122 %
FEV6-%Change-Post: -3 %
FEV6-%Pred-Post: 89 %
FEV6-%Pred-Pre: 93 %
FEV6-Post: 2.03 L
FEV6-Pre: 2.11 L
FEV6FVC-%Change-Post: 0 %
FEV6FVC-%Pred-Post: 108 %
FEV6FVC-%Pred-Pre: 108 %
FVC-%Change-Post: -3 %
FVC-%Pred-Post: 83 %
FVC-%Pred-Pre: 85 %
FVC-Post: 2.05 L
FVC-Pre: 2.11 L
Post FEV1/FVC ratio: 91 %
Post FEV6/FVC ratio: 100 %
Pre FEV1/FVC ratio: 91 %
Pre FEV6/FVC Ratio: 100 %
RV % pred: 70 %
RV: 1.6 L
TLC % pred: 67 %
TLC: 3.68 L

## 2020-02-03 MED ORDER — ALBUTEROL SULFATE (2.5 MG/3ML) 0.083% IN NEBU
2.5000 mg | INHALATION_SOLUTION | Freq: Once | RESPIRATORY_TRACT | Status: AC
  Administered 2020-02-03: 2.5 mg via RESPIRATORY_TRACT

## 2020-02-05 ENCOUNTER — Other Ambulatory Visit: Payer: Self-pay | Admitting: Internal Medicine

## 2020-02-05 DIAGNOSIS — K219 Gastro-esophageal reflux disease without esophagitis: Secondary | ICD-10-CM | POA: Diagnosis not present

## 2020-02-05 DIAGNOSIS — I10 Essential (primary) hypertension: Secondary | ICD-10-CM | POA: Diagnosis not present

## 2020-02-05 DIAGNOSIS — U071 COVID-19: Secondary | ICD-10-CM | POA: Diagnosis not present

## 2020-02-05 DIAGNOSIS — R0609 Other forms of dyspnea: Secondary | ICD-10-CM

## 2020-02-05 DIAGNOSIS — J449 Chronic obstructive pulmonary disease, unspecified: Secondary | ICD-10-CM | POA: Diagnosis not present

## 2020-02-05 DIAGNOSIS — E1121 Type 2 diabetes mellitus with diabetic nephropathy: Secondary | ICD-10-CM | POA: Diagnosis not present

## 2020-02-05 DIAGNOSIS — Z1331 Encounter for screening for depression: Secondary | ICD-10-CM | POA: Diagnosis not present

## 2020-02-05 DIAGNOSIS — Z0000 Encounter for general adult medical examination without abnormal findings: Secondary | ICD-10-CM | POA: Diagnosis not present

## 2020-02-05 DIAGNOSIS — Z6828 Body mass index (BMI) 28.0-28.9, adult: Secondary | ICD-10-CM | POA: Diagnosis not present

## 2020-02-05 DIAGNOSIS — I5022 Chronic systolic (congestive) heart failure: Secondary | ICD-10-CM | POA: Diagnosis not present

## 2020-02-05 DIAGNOSIS — N1831 Chronic kidney disease, stage 3a: Secondary | ICD-10-CM | POA: Diagnosis not present

## 2020-02-06 ENCOUNTER — Encounter: Payer: Self-pay | Admitting: Internal Medicine

## 2020-02-06 ENCOUNTER — Other Ambulatory Visit: Payer: Self-pay

## 2020-02-06 ENCOUNTER — Ambulatory Visit (HOSPITAL_COMMUNITY)
Admission: RE | Admit: 2020-02-06 | Discharge: 2020-02-06 | Disposition: A | Discharge status: Home / Self Care | Payer: Medicare PPO | Source: Ambulatory Visit | Attending: Internal Medicine | Admitting: Internal Medicine

## 2020-02-06 ENCOUNTER — Ambulatory Visit (INDEPENDENT_AMBULATORY_CARE_PROVIDER_SITE_OTHER): Payer: Medicare PPO | Admitting: Internal Medicine

## 2020-02-06 DIAGNOSIS — R0600 Dyspnea, unspecified: Secondary | ICD-10-CM | POA: Diagnosis not present

## 2020-02-06 DIAGNOSIS — K219 Gastro-esophageal reflux disease without esophagitis: Secondary | ICD-10-CM | POA: Diagnosis not present

## 2020-02-06 DIAGNOSIS — E1121 Type 2 diabetes mellitus with diabetic nephropathy: Secondary | ICD-10-CM | POA: Diagnosis not present

## 2020-02-06 DIAGNOSIS — J9611 Chronic respiratory failure with hypoxia: Secondary | ICD-10-CM

## 2020-02-06 DIAGNOSIS — R0602 Shortness of breath: Secondary | ICD-10-CM | POA: Diagnosis not present

## 2020-02-06 DIAGNOSIS — R0609 Other forms of dyspnea: Secondary | ICD-10-CM

## 2020-02-06 DIAGNOSIS — I130 Hypertensive heart and chronic kidney disease with heart failure and stage 1 through stage 4 chronic kidney disease, or unspecified chronic kidney disease: Secondary | ICD-10-CM | POA: Diagnosis not present

## 2020-02-06 DIAGNOSIS — J811 Chronic pulmonary edema: Secondary | ICD-10-CM | POA: Diagnosis not present

## 2020-02-06 DIAGNOSIS — N1831 Chronic kidney disease, stage 3a: Secondary | ICD-10-CM | POA: Diagnosis not present

## 2020-02-06 DIAGNOSIS — I5022 Chronic systolic (congestive) heart failure: Secondary | ICD-10-CM | POA: Diagnosis not present

## 2020-02-06 NOTE — Progress Notes (Signed)
Allen [name], male    DOB: 1934/04/19, 86 y.o.   MRN: [id number]   Brief patient profile:  85 yobm quit smoking 2013 without apparent sequelae then onset covid 19 xmas 2020 and admitted Jan 2021 and breathing hasn't been right since so referred to pulmonary clinic in [facility]  09/29/2019 by Dr   Diona Browner      History of Present Illness  09/29/2019  Pulmonary/ 1st office eval/Nickoles Gregori  Chief Complaint  Patient presents with  . Pulmonary Consult    Referred by Dr [name]. Pt c/o SOB since had covid Dec 2020.   Dyspnea:  Breathing about the same as at d/c from [facility] can walk flat for 3-4 min does not check while walking Cough: not much , thick mucus from nose  Sleep: bed is flat / 2 pillows  SABA use:  02  3 concentrator and 3lpm on portable system does not titrate rec Will send order to [facility] to be sure your oxygen is humdified Please remember to go to the lab department   for your tests - we will call you with the results when they are available - ok to wait 10 days as per Dr McDowell's instructions  Make sure you check your oxygen saturations at highest level of activity to be sure it stays over 90% and adjust upward to maintain this level if needed but remember to turn it back to previous settings when you stop (to conserve your supply).    10/31/2019  f/u ov/Meiko Stranahan re: doe / 02 dep since covid, got 1st vaccine ? Loss adjuster, chartered Complaint  Patient presents with  . Follow-up    Breathing has improved since the last visit. No cough or wheezing.   Dyspnea:  Walk in from [facility] parking on 3lpm portable tank , can do one aisle at grocery store s variability  Cough: none Sleeping: bed is flat 2 pillows  SABA use: no inhalers 02:   3lpm 24/7  rec Make sure you check your oxygen saturations at highest level of activity to be sure it stays over 90%   Office visit in 6 weeks, call sooner if needed with all medications    12/11/2019  f/u ov/Tesneem Dufrane re: sob x 2015 / 02  only since covid with   2nd shot May 25 21 - now on symbicort ? strength Chief Complaint  Patient presents with  . Follow-up    Breathing is overall doing well today.    Dyspnea:  Maybe 2 aisles at [employer] / walking at park some but not monitoring sats as rec  Cough: none  Sleeping: flat bed 2 pillows  SABA use: none now on symbicort 160 2bid 02: 3lpm conc and portable  = 2 lpm does not titrate  Rec Work on inhaler technique:    Stay on symbicort for now  Take 2 puffs first thing in am and then another 2 puffs about 12 hours later.  Make sure you check your oxygen saturations at highest level of activity to be sure it stays over 90%     02/06/2020  f/u ov/[place] office/Crescencio Jozwiak re:  Chief Complaint  Patient presents with  . Follow-up    Patient feels good overall but has been getting dizzy when he stands up or turns head to fast. PFT 8/24  Dyspnea:  Walking 12 min improving tol  despite not using 02 with ex as rec  Cough: none / some am congestion nothing purulent produced Sleeping: flat one pillow  ok   SABA use: none  02: 3lpm hs and prn but not understanding prn concept(tirate to sat > 90%)   No obvious day to day or daytime variability or assoc excess/ purulent sputum or mucus plugs or hemoptysis or cp or chest tightness, subjective wheeze or overt sinus or hb symptoms.   Sleeping  without nocturnal  or early am exacerbation  of respiratory  c/o's or need for noct saba. Also denies any obvious fluctuation of symptoms with weather or environmental changes or other aggravating or alleviating factors except as outlined above   No unusual exposure hx or h/o childhood pna/ asthma or knowledge of premature birth.  Current Allergies, Complete Past Medical History, Past Surgical History, Family History, and Social History were reviewed in [employer] record.  ROS  The following are not active complaints unless bolded Hoarseness, sore throat, dysphagia, dental  problems, itching, sneezing,  nasal congestion or discharge of excess mucus or purulent secretions, ear ache,   fever, chills, sweats, unintended wt loss or wt gain, classically pleuritic or exertional cp,  orthopnea pnd or arm/hand swelling  or leg swelling, presyncope, palpitations, abdominal pain, anorexia, nausea, vomiting, diarrhea  or change in bowel habits or change in bladder habits, change in stools or change in urine, dysuria, hematuria,  rash, arthralgias, visual complaints, headache, numbness, weakness or ataxia or problems with walking or coordination,  change in mood or  Memory. Dizzy if stand up quickly or turns x 3 weeks worse onset x 3 years         Current Meds  Medication Sig  . aspirin 81 MG EC tablet Take 81 mg by mouth daily.  Marland Kitchen atorvastatin (LIPITOR) 80 MG tablet Take 1 tablet (80 mg total) by mouth daily at 6 PM.  . budesonide-formoterol (SYMBICORT) 160-4.5 MCG/ACT inhaler Inhale 2 puffs into the lungs 2 (two) times daily.  . carvedilol (COREG) 3.125 MG tablet TAKE 1 TABLET  TWO  TIMES DAILY.  . cholecalciferol (VITAMIN D3) 25 MCG (1000 UNIT) tablet Take 1,000 Units by mouth daily.  . clopidogrel (PLAVIX) 75 MG tablet TAKE 1 TABLET ONE TIME DAILY WITH BREAKFAST  . furosemide (LASIX) 20 MG tablet Take 20 mg by mouth daily as needed. Weight gain of 2-3 lbs in a 24 hour period or 5 lbs in one week  . Insulin Glargine (LANTUS SOLOSTAR) 100 UNIT/ML Solostar Pen Inject 20 Units into the skin at bedtime.  . Insulin Lispro Junior KwikPen 100 UNIT/ML SOPN   . mirtazapine (REMERON) 15 MG tablet Take 15 mg by mouth daily at 12 noon.   . nitroGLYCERIN (NITROSTAT) 0.4 MG SL tablet Place 0.4 mg under the tongue every 5 (five) minutes as needed for chest pain (If no relief after 3rd dose, proceed to the ED).  . OXYGEN 3 lpm 24/7 DME- [facility]  . repaglinide (PRANDIN) 2 MG tablet Take 2 mg by mouth 2 (two) times daily before a meal.   . sacubitril-valsartan (ENTRESTO) 24-26 MG Take 1 tablet  by mouth 2 (two) times daily.  . vitamin B-12 (CYANOCOBALAMIN) 1000 MCG tablet                 Past Medical History:  Diagnosis Date  . AICD (automatic cardioverter/defibrillator) present 07/17/2018   BIV  . CAD (coronary artery disease)    a. 04/2016: s/p CABG with LIMA-LAD, SVG-D1, SVG-RCA, and seq SVG-OM1-OM2  . Essential hypertension   . Hyperlipidemia   . Ischemic cardiomyopathy    LVEF 35%  May 2016  . LBBB (left bundle branch block)   . Pneumonia due to COVID-19 virus   . Sinus bradycardia   . Type 2 diabetes mellitus (HCC)        Objective:    02/06/2020        173   12/11/2019         176   10/31/19 173 lb (78.5 kg)  09/29/19 171 lb (77.6 kg)  09/02/19 170 lb (77.1 kg)         HEENT : pt wearing mask not removed for exam due to covid -19 concerns.    NECK :  without JVD/Nodes/TM/ nl carotid upstrokes bilaterally   LUNGS: no acc muscle use,  Nl contour chest with a few insp crackles in bases  bilaterally without cough on insp or exp maneuvers   CV:  RRR  no s3 or 2-3/6 SEM and 2/6 dias m  s increase in P2, and no edema   ABD:  soft and nontender with nl inspiratory excursion in the supine position. No bruits or organomegaly appreciated, bowel sounds nl  MS:  Nl gait/ ext warm without deformities, calf tenderness, cyanosis or clubbing No obvious joint restrictions   SKIN: warm and dry without lesions    NEURO:  alert, approp, nl sensorium with  no motor or cerebellar deficits apparent.      CXR PA and Lateral:   02/06/2020 :    I personally reviewed images and agree with radiology impression as follows:   Post CABG and ICD placement. Enlargement of cardiac silhouette with vascular congestion and probable mild persistent pulmonary edema.         Assessment

## 2020-02-06 NOTE — Assessment & Plan Note (Addendum)
Onset March, 2015. : Sensation of shortness of breath at rest at  times and always when walking to the mailbox - much worse p covid  Dec 2020 and entresto d/c'd VQ low prob 06/23/19 (done for elevated d dimer)  - Echo 09/25/19 : Left ventricular ejection fraction, by estimation, is 35%. The left  ventricle has moderately decreased function. The left ventricle  demonstrates global hypokinesis. There is moderate left ventricular  hypertrophy. Left ventricular diastolic parameters  are consistent with Grade I diastolic dysfunction (impaired relaxation).  Elevated left atrial pressure.  2. The ventricular septum is flattened in diastole suggesting RV volume  overload. . Right ventricular systolic function was not well visualized.  The right ventricular size is not well visualized. There is moderately  elevated pulmonary artery systolic  pressure.  3. Left atrial size was mildly dilated.  4. Right atrial size was mildly dilated.  5. The mitral valve is abnormal. Mild mitral valve regurgitation. No  evidence of mitral stenosis.  6. Tricuspid valve regurgitation is mild to moderate.  7. The aortic valve is tricuspid. Aortic valve regurgitation is moderate.  Mild to moderate aortic valve stenosis.  8. There is mild to moderate pulmonary HTN, PASP is 37 mmHg.  9. The inferior vena cava is normal in size with greater than 50%  respiratory variability, suggesting right atrial pressure of 3 mmHg.  - entresto restarted 09/27/19  PFT's  02/03/2020  FEV1 1.92 (113 % ) ratio 0.91  p no % improvement from saba p nothing prior to study with DLCO  8.42 (45%) corrects to 2.56 (64%)  for alv volume and FV curve s concavity    Problem appears to be largely related to valvular heart dz and deconditioning at this point / not copd or even much evidence for asthma (cadiac asthma more likely than primary airways dz based on cxr)   >>> change symbicort 160 to 2 pffs every 12 hours as needed and f/u is q 6 m  to follow 02 needs

## 2020-02-06 NOTE — Assessment & Plan Note (Addendum)
Onset at Covid 19 pna 06/2019  - 09/29/2019  sat on RA at rest was 80%-- pt placed on o2 3lpm and sats increased to 93%--walked up hallway x 1 and sats decreased to 84%--o2 increased to 4 lpm-- sats increased to 90%-- walked another lap and sats decreased to 88%--increased to 5lpm and walked another lap with sats ending at 90%//lmr -  10/31/2019   Walked RA  approx   300 ft  @ avg pace/unsteady pace  stopped due to  Sob and sats down to 88% on 5lpm continuous   -  12/11/2019   Walked after desat to 87% 2lpm pt was placed on 3lpm cont o2 and sats increased to 95% and remained 95% for 100 ft  - 02/06/2020 sats ok at rest but after 100 ft dropped to 85% and on 3lpm able to do 300 ft s desat or sob at slow pace    >>>   rec 3lpm hs and 3lpm with activity more than room to room at home.        Each maintenance medication was reviewed in detail including emphasizing most importantly the difference between maintenance and prns and under what circumstances the prns are to be triggered using an action plan format where appropriate.  Total time for H and P, chart review, counseling,  directly observing portions of ambulatory 02 saturation study/ and generating customized AVS unique to this office visit / charting  > 30 min

## 2020-02-06 NOTE — Patient Instructions (Addendum)
Change symbicort  Use up to every 12 hours to see if it helps.  Make sure you check your oxygen saturations at highest level of activity to be sure it stays over 90% and adjust upward to maintain this level if needed but remember to turn it back to previous settings when you stop (to conserve your supply).   Please remember to go to the  x-ray department  for your tests - we will call you with the results when they are available    Please schedule a follow up visit in 6 months but call sooner if needed

## 2020-02-09 NOTE — Progress Notes (Signed)
Spoke with pt and notified of results per Dr. Wert. Pt verbalized understanding and denied any questions. 

## 2020-02-11 DIAGNOSIS — U071 COVID-19: Secondary | ICD-10-CM | POA: Diagnosis not present

## 2020-03-05 DIAGNOSIS — E1121 Type 2 diabetes mellitus with diabetic nephropathy: Secondary | ICD-10-CM | POA: Diagnosis not present

## 2020-03-05 DIAGNOSIS — I10 Essential (primary) hypertension: Secondary | ICD-10-CM | POA: Diagnosis not present

## 2020-03-05 DIAGNOSIS — K219 Gastro-esophageal reflux disease without esophagitis: Secondary | ICD-10-CM | POA: Diagnosis not present

## 2020-03-05 DIAGNOSIS — J449 Chronic obstructive pulmonary disease, unspecified: Secondary | ICD-10-CM | POA: Diagnosis not present

## 2020-03-05 DIAGNOSIS — N1831 Chronic kidney disease, stage 3a: Secondary | ICD-10-CM | POA: Diagnosis not present

## 2020-03-05 DIAGNOSIS — I5022 Chronic systolic (congestive) heart failure: Secondary | ICD-10-CM | POA: Diagnosis not present

## 2020-03-07 DIAGNOSIS — U071 COVID-19: Secondary | ICD-10-CM | POA: Diagnosis not present

## 2020-03-09 ENCOUNTER — Encounter: Payer: Medicare PPO | Admitting: Student

## 2020-03-10 NOTE — Progress Notes (Signed)
Electrophysiology Office Note Date: 03/11/2020  ID:  Allen [name], DOB 1934-01-08, MRN [id number]  PCP: [name], MD Primary Cardiologist: [name], MD Electrophysiologist: Hillis Range, MD   CC: Routine ICD follow-up  Allen [name] is a 86 y.o. male seen today for Hillis Range, MD for routine electrophysiology followup.  Since last being seen in our clinic the patient reports doing "about the same" breathing wise. He is able to do all of his ADLs without difficulty. he denies chest pain, palpitations, dyspnea, PND, orthopnea, nausea, vomiting, syncope, edema, weight gain, or early satiety. He has not had ICD shocks. He has chronic dizziness that has worsened over the past several weeks. He reports this dizziness occurs with rapid position changes including turning to look at someone, or rolling over in bed.   Device History: St. Jude BiV ICD implanted 07/17/2018 for ICM, NYHA III, SSS, LBBB History of appropriate therapy: No History of AAD therapy: No   Past Medical History:  Diagnosis Date  . AICD (automatic cardioverter/defibrillator) present 07/17/2018   BIV  . CAD (coronary artery disease)    a. 04/2016: s/p CABG with LIMA-LAD, SVG-D1, SVG-RCA, and seq SVG-OM1-OM2  . Essential hypertension   . Hyperlipidemia   . Ischemic cardiomyopathy    LVEF 35% May 2016  . LBBB (left bundle branch block)   . Pneumonia due to COVID-19 virus   . Sinus bradycardia   . Type 2 diabetes mellitus (HCC)    Past Surgical History:  Procedure Laterality Date  . BIV ICD INSERTION CRT-D  07/17/2018  . BIV ICD INSERTION CRT-D N/A 07/17/2018   Procedure: BIV ICD INSERTION CRT-D;  Surgeon: Hillis Range, MD;  Location[facility] INVASIVE CV LAB;  Service: Cardiovascular;  Laterality: N/A;  . CARDIAC CATHETERIZATION N/A 11/02/2014   Procedure: Left Heart Cath and Coronary Angiography;  Surgeon: Lyn Records, MD;  Location: [facility] INVASIVE CV LAB;  Service: Cardiovascular;  Laterality: N/A;  .  CARDIAC CATHETERIZATION N/A 05/03/2016   Procedure: Left Heart Cath and Coronary Angiography;  Surgeon: Peter M Swaziland, MD;  Location: [facility] INVASIVE CV LAB;  Service: Cardiovascular;  Laterality: N/A;  . CORONARY ARTERY BYPASS GRAFT N/A 05/08/2016   Procedure: CORONARY ARTERY BYPASS GRAFTING (CABG) x 5;  Surgeon: Alleen Borne, MD;  Location: [facility] OR;  Service: Open Heart Surgery;  Laterality: N/A;  . ENDOVEIN HARVEST OF GREATER SAPHENOUS VEIN  05/08/2016   Procedure: ENDOVEIN HARVEST OF GREATER SAPHENOUS VEIN;  Surgeon: Alleen Borne, MD;  Location: [facility] OR;  Service: Open Heart Surgery;;  . TEE WITHOUT CARDIOVERSION N/A 05/08/2016   Procedure: TRANSESOPHAGEAL ECHOCARDIOGRAM (TEE);  Surgeon: Alleen Borne, MD;  Location: [facility] OR;  Service: Open Heart Surgery;  Laterality: N/A;    Current Outpatient Medications  Medication Sig Dispense Refill  . amLODipine (NORVASC) 5 MG tablet Take 2 tablets by mouth daily.    Marland Kitchen aspirin 81 MG EC tablet Take 81 mg by mouth daily.    Marland Kitchen atorvastatin (LIPITOR) 80 MG tablet Take 1 tablet (80 mg total) by mouth daily at 6 PM. 90 tablet 3  . budesonide-formoterol (SYMBICORT) 160-4.5 MCG/ACT inhaler Inhale 2 puffs into the lungs 2 (two) times daily.    . carvedilol (COREG) 3.125 MG tablet TAKE 1 TABLET  TWO  TIMES DAILY. 180 tablet 2  . cholecalciferol (VITAMIN D3) 25 MCG (1000 UNIT) tablet Take 1,000 Units by mouth daily.    . clopidogrel (PLAVIX) 75 MG tablet TAKE 1 TABLET ONE TIME  DAILY WITH BREAKFAST 90 tablet 3  . furosemide (LASIX) 20 MG tablet Take 20 mg by mouth daily as needed. Weight gain of 2-3 lbs in a 24 hour period or 5 lbs in one week    . Insulin Glargine (LANTUS SOLOSTAR) 100 UNIT/ML Solostar Pen Inject 20 Units into the skin at bedtime. 15 mL 11  . Insulin Lispro Junior KwikPen 100 UNIT/ML SOPN     . mirtazapine (REMERON) 15 MG tablet Take 15 mg by mouth daily at 12 noon.     . nitroGLYCERIN (NITROSTAT) 0.4 MG SL tablet Place 0.4 mg under the tongue  every 5 (five) minutes as needed for chest pain (If no relief after 3rd dose, proceed to the ED).    . OXYGEN 3 lpm 24/7 DME- [facility]    . repaglinide (PRANDIN) 2 MG tablet Take 2 mg by mouth 2 (two) times daily before a meal.     . sacubitril-valsartan (ENTRESTO) 24-26 MG Take 1 tablet by mouth 2 (two) times daily. 180 tablet 2  . vitamin B-12 (CYANOCOBALAMIN) 1000 MCG tablet      No current facility-administered medications for this visit.    Allergies:   Patient has no known allergies.   Social History: Social History   Socioeconomic History  . Marital status: Married    Spouse name: Not on file  . Number of children: Not on file  . Years of education: Not on file  . Highest education level: Not on file  Occupational History  . Occupation: [name]    Comment: Retired  Tobacco Use  . Smoking status: Former Smoker    Packs/day: 0.50    Years: 56.00    Pack years: 28.00    Types: Cigarettes    Start date: 02/18/1956    Quit date: 06/13/2011    Years since quitting: 8.7  . Smokeless tobacco: Never Used  Vaping Use  . Vaping Use: Never used  Substance and Sexual Activity  . Alcohol use: No    Alcohol/week: 0.0 standard drinks  . Drug use: No  . Sexual activity: Never  Other Topics Concern  . 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 Difficulty of Paying Living Expenses: Not on file  Food Insecurity:   . Worried About Programme researcher, broadcasting/film/video in the Last Year: Not on file  . Ran Out of Food in the Last Year: Not on file  Transportation Needs:   . Lack of Transportation (Medical): Not on file  . Lack of Transportation (Non-Medical): Not on file  Physical Activity:   . Days of Exercise per Week: Not on file  . Minutes of Exercise per Session: Not on file  Stress:   . Feeling of Stress : Not on file  Social Connections:   . Frequency of Communication with  Friends and Family: Not on file  . Frequency of Social Gatherings with Friends and Family: Not on file  . Attends Religious Services: Not on file  . Active Member of Clubs or Organizations: Not on file  . Attends Banker Meetings: Not on file  . Marital Status: Not on file  Intimate Partner Violence:   . Fear of Current or Ex-Partner: Not on file  . Emotionally Abused: Not on file  . Physically Abused: Not on file  . Sexually Abused: Not on file    Family  History: Family History  Problem Relation Age of Onset  . Unexplained death Father   . Unexplained death Mother        Old age  . Coronary artery disease Brother 76    Review of Systems: All other systems reviewed and are otherwise negative except as noted above.   Physical Exam: Vitals:   03/11/20 1056  BP: 128/82  Pulse: 60  SpO2: 98%  Weight: 180 lb 6.4 oz (81.8 kg)  Height: 5\' 2"  (1.575 m)     GEN- The patient is well appearing, alert and oriented x 3 today.   HEENT: normocephalic, atraumatic; sclera clear, conjunctiva pink; hearing intact; oropharynx clear; neck supple, no JVP Lymph- no cervical lymphadenopathy Lungs- Clear to ausculation bilaterally, normal work of breathing.  No wheezes, rales, rhonchi Heart- Regular rate and rhythm, no murmurs, rubs or gallops, PMI not laterally displaced GI- soft, non-tender, non-distended, bowel sounds present, no hepatosplenomegaly Extremities- no clubbing or cyanosis. No edema; DP/PT/radial pulses 2+ bilaterally MS- no significant deformity or atrophy Skin- warm and dry, no rash or lesion; ICD pocket well healed Psych- euthymic mood, full affect Neuro- strength and sensation are intact  ICD interrogation- reviewed in detail today,  See PACEART report  EKG:  EKG is ordered today. The ekg ordered today shows (baseline) AV dual paced at 60 bpm with QRS of 176 ms, PR interval of 132 ms.   V-V Optimization Baseline -> D1-> RV Coil, PAV 130 ms showed AV dual  paced at 60 bpm with QRS of 176 ms (RBBB pattern), PR interval of 132 ms, negative in lead 1.  LV tip to Ring 2; LV>RV 25 ms, PAV 130 ms: QRS 172 ms, RBBB, negative lead 1 (scanned) MSP; 3>Coil then 1>2  50 ms, QRS 164 ms, biphasic V1, initially positive lead 1 (scanned) MSP; 3>Coil then 1>2, simultaneous, QRS 164 ms, biphasic V1, Initially positive lead 1 (scanned) MSP; 1>2 then 3>coil, simultaneous, QRS 164 ms, RBBB, initially negative lead 1  New programming-> Lv tip (1) to Ring 2, LV>RV 60 ms (device recommendation), PAV 200 ms (device recommendation, intrinsic A>R of 300 ms)   Felt to be best option today. QRS 122 ms, positive V1, biphasic lead 1 (initially positive).    Recent Labs: 09/29/2019: B Natriuretic Peptide 1,057.0; Hemoglobin 13.2; Platelets 276; TSH 2.837 12/09/2019: BUN 17; Creatinine, Ser 1.71; Potassium 4.8; Sodium 142   Wt Readings from Last 3 Encounters:  03/11/20 180 lb 6.4 oz (81.8 kg)  02/06/20 173 lb (78.5 kg)  01/15/20 178 lb (80.7 kg)     Other studies Reviewed: Additional studies/ records that were reviewed today include: Previous EP office notes, Previous AV optimization notes   Assessment and Plan:  1.  Chronic systolic dysfunction s/p St. Jude CRT-D  euvolemic today Stable on an appropriate medical regimen Normal ICD function See Pace Art report No changes today S/p AV optimization 10/27/2019. He felt somewhat better and has maintained this.  V-V optimization today as above. QRS 176 > 122 ms after reprogramming with positive/RBBB V1 and biphasic lead 1. Previous optimization imaging was reviewed and felt appropriate to increase PAV delay at this time, with intrinsic A to R of 300 ms.  2. Sinus bradycardia Stable s/p device implant  3. HTN Stable  4. Vertiginous dizziness As above, symptomatic with turning head and rolling over in bed. BP stable.  Encouraged PCP follow up.  Current medicines are reviewed at length with the patient today.     The patient  does not have concerns regarding his medicines.  The following changes were made today:  none  Labs/ tests ordered today include:  Orders Placed This Encounter  Procedures  . EKG 12-Lead   Disposition:   Follow up with EP APP  6 months   Signed, Graciella FreerMichael Andrew Tauriel Scronce, PA-C  03/11/2020 12:01 PM  [facility] [address] [place] [address] [phone] (office) [phone] (fax)

## 2020-03-11 ENCOUNTER — Ambulatory Visit (INDEPENDENT_AMBULATORY_CARE_PROVIDER_SITE_OTHER): Payer: Medicare PPO | Admitting: Student

## 2020-03-11 ENCOUNTER — Encounter: Payer: Self-pay | Admitting: Student

## 2020-03-11 ENCOUNTER — Other Ambulatory Visit: Payer: Self-pay

## 2020-03-11 VITALS — BP 128/82 | HR 60 | Ht 62.0 in | Wt 180.4 lb

## 2020-03-11 DIAGNOSIS — I25119 Atherosclerotic heart disease of native coronary artery with unspecified angina pectoris: Secondary | ICD-10-CM | POA: Diagnosis not present

## 2020-03-11 DIAGNOSIS — I10 Essential (primary) hypertension: Secondary | ICD-10-CM | POA: Diagnosis not present

## 2020-03-11 DIAGNOSIS — R001 Bradycardia, unspecified: Secondary | ICD-10-CM

## 2020-03-11 DIAGNOSIS — Z95810 Presence of automatic (implantable) cardiac defibrillator: Secondary | ICD-10-CM

## 2020-03-11 DIAGNOSIS — I255 Ischemic cardiomyopathy: Secondary | ICD-10-CM | POA: Diagnosis not present

## 2020-03-11 DIAGNOSIS — I5042 Chronic combined systolic (congestive) and diastolic (congestive) heart failure: Secondary | ICD-10-CM

## 2020-03-11 LAB — CUP PACEART INCLINIC DEVICE CHECK
Battery Remaining Longevity: 61 mo
Brady Statistic RA Percent Paced: 78 %
Brady Statistic RV Percent Paced: 99.26 %
Date Time Interrogation Session: 20210930114433
HighPow Impedance: 66.375
Implantable Lead Implant Date: 20200205
Implantable Lead Implant Date: 20200205
Implantable Lead Implant Date: 20200205
Implantable Lead Location: 753858
Implantable Lead Location: 753859
Implantable Lead Location: 753860
Implantable Pulse Generator Implant Date: 20200205
Lead Channel Impedance Value: 1037.5 Ohm
Lead Channel Impedance Value: 437.5 Ohm
Lead Channel Impedance Value: 475 Ohm
Lead Channel Pacing Threshold Amplitude: 0.5 V
Lead Channel Pacing Threshold Amplitude: 1 V
Lead Channel Pacing Threshold Amplitude: 1 V
Lead Channel Pacing Threshold Pulse Width: 0.5 ms
Lead Channel Pacing Threshold Pulse Width: 0.5 ms
Lead Channel Pacing Threshold Pulse Width: 0.5 ms
Lead Channel Sensing Intrinsic Amplitude: 11.7 mV
Lead Channel Sensing Intrinsic Amplitude: 2.7 mV
Lead Channel Setting Pacing Amplitude: 2 V
Lead Channel Setting Pacing Amplitude: 2 V
Lead Channel Setting Pacing Amplitude: 2.5 V
Lead Channel Setting Pacing Pulse Width: 0.5 ms
Lead Channel Setting Pacing Pulse Width: 0.5 ms
Lead Channel Setting Sensing Sensitivity: 0.5 mV
Pulse Gen Serial Number: 9880571

## 2020-03-11 NOTE — Patient Instructions (Signed)
Medication Instructions:  *If you need a refill on your cardiac medications before your next appointment, please call your pharmacy*  Follow-Up: At [facility], you and your health needs are our priority.  As part of our continuing mission to provide you with exceptional heart care, we have created designated Provider Care Teams.  These Care Teams include your primary Cardiologist (physician) and Advanced Practice Providers (APPs -  Physician Assistants and Nurse Practitioners) who all work together to provide you with the care you need, when you need it.  We recommend signing up for the patient portal called "MyChart".  Sign up information is provided on this After Visit Summary.  MyChart is used to connect with patients for Virtual Visits (Telemedicine).  Patients are able to view lab/test results, encounter notes, upcoming appointments, etc.  Non-urgent messages can be sent to your provider as well.   To learn more about what you can do with MyChart, go to [email or web address].    Your next appointment:   Your physician wants you to follow-up in: 6 MONTHS with [name], PA-C. You will receive a reminder letter in the mail two months in advance. If you don't receive a letter, please call our office to schedule the follow-up appointment.  Remote monitoring is used to monitor your ICD from home. This monitoring reduces the number of office visits required to check your device to one time per year. It allows [place] to keep an eye on the functioning of your device to ensure it is working properly. You are scheduled for a device check from home on 04/19/20. You may send your transmission at any time that day. If you have a wireless device, the transmission will be sent automatically. After your physician reviews your transmission, you will receive a postcard with your next transmission date   The format for your next appointment:   In Person with Casimiro Needle "Mardelle Matte" Lanna Poche, PA-C

## 2020-03-12 DIAGNOSIS — U071 COVID-19: Secondary | ICD-10-CM | POA: Diagnosis not present

## 2020-03-13 ENCOUNTER — Other Ambulatory Visit: Payer: Self-pay | Admitting: Cardiology

## 2020-03-24 DIAGNOSIS — R3129 Other microscopic hematuria: Secondary | ICD-10-CM | POA: Diagnosis not present

## 2020-03-24 DIAGNOSIS — Z87438 Personal history of other diseases of male genital organs: Secondary | ICD-10-CM | POA: Diagnosis not present

## 2020-03-24 DIAGNOSIS — N401 Enlarged prostate with lower urinary tract symptoms: Secondary | ICD-10-CM | POA: Diagnosis not present

## 2020-03-25 DIAGNOSIS — K219 Gastro-esophageal reflux disease without esophagitis: Secondary | ICD-10-CM | POA: Diagnosis not present

## 2020-03-25 DIAGNOSIS — I10 Essential (primary) hypertension: Secondary | ICD-10-CM | POA: Diagnosis not present

## 2020-03-25 DIAGNOSIS — N1831 Chronic kidney disease, stage 3a: Secondary | ICD-10-CM | POA: Diagnosis not present

## 2020-03-25 DIAGNOSIS — J449 Chronic obstructive pulmonary disease, unspecified: Secondary | ICD-10-CM | POA: Diagnosis not present

## 2020-03-25 DIAGNOSIS — I5022 Chronic systolic (congestive) heart failure: Secondary | ICD-10-CM | POA: Diagnosis not present

## 2020-03-25 DIAGNOSIS — E1121 Type 2 diabetes mellitus with diabetic nephropathy: Secondary | ICD-10-CM | POA: Diagnosis not present

## 2020-03-30 DIAGNOSIS — J449 Chronic obstructive pulmonary disease, unspecified: Secondary | ICD-10-CM | POA: Diagnosis not present

## 2020-03-30 DIAGNOSIS — I5022 Chronic systolic (congestive) heart failure: Secondary | ICD-10-CM | POA: Diagnosis not present

## 2020-03-30 DIAGNOSIS — Z6828 Body mass index (BMI) 28.0-28.9, adult: Secondary | ICD-10-CM | POA: Diagnosis not present

## 2020-04-06 DIAGNOSIS — U071 COVID-19: Secondary | ICD-10-CM | POA: Diagnosis not present

## 2020-04-12 DIAGNOSIS — U071 COVID-19: Secondary | ICD-10-CM | POA: Diagnosis not present

## 2020-04-19 ENCOUNTER — Ambulatory Visit (INDEPENDENT_AMBULATORY_CARE_PROVIDER_SITE_OTHER): Payer: Medicare PPO

## 2020-04-19 DIAGNOSIS — I255 Ischemic cardiomyopathy: Secondary | ICD-10-CM

## 2020-04-19 LAB — CUP PACEART REMOTE DEVICE CHECK
Battery Remaining Longevity: 61 mo
Battery Remaining Percentage: 70 %
Battery Voltage: 2.96 V
Brady Statistic AP VP Percent: 88 %
Brady Statistic AP VS Percent: 1 %
Brady Statistic AS VP Percent: 11 %
Brady Statistic AS VS Percent: 1 %
Brady Statistic RA Percent Paced: 88 %
Date Time Interrogation Session: 20211108010015
HighPow Impedance: 59 Ohm
HighPow Impedance: 59 Ohm
Implantable Lead Implant Date: 20200205
Implantable Lead Implant Date: 20200205
Implantable Lead Implant Date: 20200205
Implantable Lead Location: 753858
Implantable Lead Location: 753859
Implantable Lead Location: 753860
Implantable Pulse Generator Implant Date: 20200205
Lead Channel Impedance Value: 430 Ohm
Lead Channel Impedance Value: 450 Ohm
Lead Channel Impedance Value: 950 Ohm
Lead Channel Pacing Threshold Amplitude: 0.625 V
Lead Channel Pacing Threshold Amplitude: 1 V
Lead Channel Pacing Threshold Amplitude: 1 V
Lead Channel Pacing Threshold Pulse Width: 0.5 ms
Lead Channel Pacing Threshold Pulse Width: 0.5 ms
Lead Channel Pacing Threshold Pulse Width: 0.5 ms
Lead Channel Sensing Intrinsic Amplitude: 11.7 mV
Lead Channel Sensing Intrinsic Amplitude: 4.2 mV
Lead Channel Setting Pacing Amplitude: 2 V
Lead Channel Setting Pacing Amplitude: 2 V
Lead Channel Setting Pacing Amplitude: 2.5 V
Lead Channel Setting Pacing Pulse Width: 0.5 ms
Lead Channel Setting Pacing Pulse Width: 0.5 ms
Lead Channel Setting Sensing Sensitivity: 0.5 mV
Pulse Gen Serial Number: 9880571

## 2020-04-20 NOTE — Progress Notes (Signed)
Remote ICD transmission.   

## 2020-04-26 DIAGNOSIS — I10 Essential (primary) hypertension: Secondary | ICD-10-CM | POA: Diagnosis not present

## 2020-04-26 DIAGNOSIS — K219 Gastro-esophageal reflux disease without esophagitis: Secondary | ICD-10-CM | POA: Diagnosis not present

## 2020-04-26 DIAGNOSIS — E1121 Type 2 diabetes mellitus with diabetic nephropathy: Secondary | ICD-10-CM | POA: Diagnosis not present

## 2020-05-05 DIAGNOSIS — I5042 Chronic combined systolic (congestive) and diastolic (congestive) heart failure: Secondary | ICD-10-CM | POA: Diagnosis not present

## 2020-05-07 DIAGNOSIS — U071 COVID-19: Secondary | ICD-10-CM | POA: Diagnosis not present

## 2020-05-12 DIAGNOSIS — U071 COVID-19: Secondary | ICD-10-CM | POA: Diagnosis not present

## 2020-05-16 ENCOUNTER — Other Ambulatory Visit: Payer: Self-pay | Admitting: Cardiology

## 2020-05-17 ENCOUNTER — Telehealth: Payer: Self-pay | Admitting: *Deleted

## 2020-05-17 NOTE — Telephone Encounter (Signed)
-----   Message from Jonelle Sidle, MD sent at 05/16/2020  6:01 PM EST ----- Results reviewed.  Relatively stable renal function and potassium, would continue with current medications and follow-up plan.

## 2020-05-18 NOTE — Telephone Encounter (Signed)
Patient informed. Copy sent to PCP °

## 2020-05-19 ENCOUNTER — Ambulatory Visit: Payer: Medicare PPO | Admitting: Cardiology

## 2020-05-20 DIAGNOSIS — I10 Essential (primary) hypertension: Secondary | ICD-10-CM | POA: Diagnosis not present

## 2020-05-20 DIAGNOSIS — K219 Gastro-esophageal reflux disease without esophagitis: Secondary | ICD-10-CM | POA: Diagnosis not present

## 2020-05-20 DIAGNOSIS — E1121 Type 2 diabetes mellitus with diabetic nephropathy: Secondary | ICD-10-CM | POA: Diagnosis not present

## 2020-06-06 DIAGNOSIS — J449 Chronic obstructive pulmonary disease, unspecified: Secondary | ICD-10-CM | POA: Diagnosis not present

## 2020-06-10 DIAGNOSIS — Z6828 Body mass index (BMI) 28.0-28.9, adult: Secondary | ICD-10-CM | POA: Diagnosis not present

## 2020-06-10 DIAGNOSIS — I5022 Chronic systolic (congestive) heart failure: Secondary | ICD-10-CM | POA: Diagnosis not present

## 2020-06-10 DIAGNOSIS — J449 Chronic obstructive pulmonary disease, unspecified: Secondary | ICD-10-CM | POA: Diagnosis not present

## 2020-06-12 DIAGNOSIS — U071 COVID-19: Secondary | ICD-10-CM | POA: Diagnosis not present

## 2020-06-21 ENCOUNTER — Telehealth: Payer: Self-pay | Admitting: Cardiology

## 2020-06-21 ENCOUNTER — Telehealth: Payer: Self-pay | Admitting: Internal Medicine

## 2020-06-21 NOTE — Telephone Encounter (Signed)
Called pt to assist with sending manual transmission from remote monitor.  He stated he was not home yet, almost there now.  Provided instructions on sending transmission.

## 2020-06-21 NOTE — Telephone Encounter (Signed)
Reports intermittent dull and sharp left neck pain radiating to left side of chest, rated 8/10 that started one week ago with SOB. Reports dizziness all the time that is unchanged. Denies active chest pain. Has not used nitroglycerin and reports not using any tylenol. Gave first available appointment 06/24/2020 and advised that if symptoms got worse to go to the ED for an evaluation. Patient declined 06/24/2020 visit stating he didn't think he needed to wait that long. Advised to go to the ED now for an evaluation. Verbalized understanding of plan.

## 2020-06-21 NOTE — Telephone Encounter (Signed)
Pt c/o of Chest Pain: 1. Are you having CP right now?  OFF AND ON FOR A WEEK NOW  2. Are you experiencing any other symptoms (ex. SOB, nausea, vomiting, sweating)? Patient is on Oxygen for shortness of breath  3. How long have you been experiencing CP? 1 week  4. Is your CP continuous or coming and going? Both  5. Have you taken Nitroglycerin? No   Patient states that he is having pains in his left arm and left neck.

## 2020-06-21 NOTE — Telephone Encounter (Signed)
Patient calling to speak with Dr. Johney Frame or his nurse. Did not want me to take a message.

## 2020-06-21 NOTE — Telephone Encounter (Signed)
Manual transmission received, Presenting Rhythm AP/BiVP 77 bpm.  No ventricular arrhythmias.  1 AMS episode that was ck on 12/1.    Pt does have frequent PMT for which he should be evaluated in office.  If no other recommendations please forward to scheduling to make appt for pt.

## 2020-06-21 NOTE — Telephone Encounter (Signed)
Patient has plan set up for intermittent chest pain already with Dr. Ival Bible office. Will get transmission from ICD to see if anything different needs to be done.

## 2020-07-04 NOTE — Progress Notes (Signed)
Cardiology Office Note  Date: 07/05/2020   ID: Allen [name], DOB December 19, 1933, MRN [id number]  PCP:  [name], MD  Cardiologist:  [name], MD Electrophysiologist:  Hillis Range, MD   Chief Complaint  Patient presents with  . Cardiac follow-up    History of Present Illness: Allen [name] is an 87 y.o. male last seen in August 2021.  He is here today for a follow-up visit.  Telephone notes reviewed, he did have an episode of left-sided chest discomfort earlier this month, spontaneously resolved, and has not recurred.  He does not report any sense of palpitations and otherwise has had no change in stamina or weight.  He follows with Dr. Johney Frame, St. Jude biventricular ICD in place.  Most recent device interrogation did not demonstrate any ventricular arrhythmias.  There were frequent episodes of PMT with EP follow-up pending Thursday of this week.  He has had no device shocks or syncope.  Last echocardiogram was in April 2021 at which point LVEF approximately 35%.  There was also mild to moderate aortic stenosis.  We discussed getting a follow-up echocardiogram.  I reviewed his medications which are outlined below and stable from a cardiac perspective.  Past Medical History:  Diagnosis Date  . AICD (automatic cardioverter/defibrillator) present 07/17/2018   BIV  . CAD (coronary artery disease)    a. 04/2016: s/p CABG with LIMA-LAD, SVG-D1, SVG-RCA, and seq SVG-OM1-OM2  . Essential hypertension   . Hyperlipidemia   . Ischemic cardiomyopathy    LVEF 35% May 2016  . LBBB (left bundle branch block)   . Pneumonia due to COVID-19 virus   . Sinus bradycardia   . Type 2 diabetes mellitus (HCC)     Past Surgical History:  Procedure Laterality Date  . BIV ICD INSERTION CRT-D  07/17/2018  . BIV ICD INSERTION CRT-D N/A 07/17/2018   Procedure: BIV ICD INSERTION CRT-D;  Surgeon: Hillis Range, MD;  Location: [facility] INVASIVE CV LAB;  Service: Cardiovascular;  Laterality: N/A;   . CARDIAC CATHETERIZATION N/A 11/02/2014   Procedure: Left Heart Cath and Coronary Angiography;  Surgeon: Lyn Records, MD;  Location: [facility] INVASIVE CV LAB;  Service: Cardiovascular;  Laterality: N/A;  . CARDIAC CATHETERIZATION N/A 05/03/2016   Procedure: Left Heart Cath and Coronary Angiography;  Surgeon: Peter M Swaziland, MD;  Location: [facility] INVASIVE CV LAB;  Service: Cardiovascular;  Laterality: N/A;  . CORONARY ARTERY BYPASS GRAFT N/A 05/08/2016   Procedure: CORONARY ARTERY BYPASS GRAFTING (CABG) x 5;  Surgeon: Alleen Borne, MD;  Location: [facility] OR;  Service: Open Heart Surgery;  Laterality: N/A;  . ENDOVEIN HARVEST OF GREATER SAPHENOUS VEIN  05/08/2016   Procedure: ENDOVEIN HARVEST OF GREATER SAPHENOUS VEIN;  Surgeon: Alleen Borne, MD;  Location: [facility] OR;  Service: Open Heart Surgery;;  . TEE WITHOUT CARDIOVERSION N/A 05/08/2016   Procedure: TRANSESOPHAGEAL ECHOCARDIOGRAM (TEE);  Surgeon: Alleen Borne, MD;  Location: [facility] OR;  Service: Open Heart Surgery;  Laterality: N/A;    Current Outpatient Medications  Medication Sig Dispense Refill  . amLODipine (NORVASC) 5 MG tablet Take 2 tablets by mouth daily.    Marland Kitchen aspirin 81 MG EC tablet Take 81 mg by mouth daily.    Marland Kitchen atorvastatin (LIPITOR) 80 MG tablet Take 1 tablet (80 mg total) by mouth daily at 6 PM. 90 tablet 3  . budesonide-formoterol (SYMBICORT) 160-4.5 MCG/ACT inhaler Inhale 2 puffs into the lungs 2 (two) times daily.    . carvedilol (COREG) 3.125  MG tablet TAKE 1 TABLET  TWO  TIMES DAILY. 180 tablet 1  . cholecalciferol (VITAMIN D3) 25 MCG (1000 UNIT) tablet Take 1,000 Units by mouth daily.    . clopidogrel (PLAVIX) 75 MG tablet TAKE 1 TABLET ONE TIME DAILY WITH BREAKFAST 90 tablet 0  . furosemide (LASIX) 20 MG tablet Take 20 mg by mouth daily as needed. Weight gain of 2-3 lbs in a 24 hour period or 5 lbs in one week    . Insulin Glargine (LANTUS SOLOSTAR) 100 UNIT/ML Solostar Pen Inject 20 Units into the skin at bedtime. 15 mL 11  .  Insulin Lispro Junior KwikPen 100 UNIT/ML SOPN     . mirtazapine (REMERON) 15 MG tablet Take 15 mg by mouth daily at 12 noon.     . nitroGLYCERIN (NITROSTAT) 0.4 MG SL tablet Place 0.4 mg under the tongue every 5 (five) minutes as needed for chest pain (If no relief after 3rd dose, proceed to the ED).    . OXYGEN 3 lpm 24/7 DME- [facility]    . repaglinide (PRANDIN) 2 MG tablet Take 2 mg by mouth 2 (two) times daily before a meal.     . sacubitril-valsartan (ENTRESTO) 24-26 MG Take 1 tablet by mouth 2 (two) times daily. 180 tablet 2  . vitamin B-12 (CYANOCOBALAMIN) 1000 MCG tablet      No current facility-administered medications for this visit.   Allergies:  Patient has no known allergies.   ROS: No palpitations or syncope.  Physical Exam: VS:  BP 122/78   Pulse 63   Ht 5\' 2"  (1.575 m)   Wt 179 lb (81.2 kg)   SpO2 94%   BMI 32.74 kg/m , BMI Body mass index is 32.74 kg/m.  Wt Readings from Last 3 Encounters:  07/05/20 179 lb (81.2 kg)  03/11/20 180 lb 6.4 oz (81.8 kg)  02/06/20 173 lb (78.5 kg)    General: Elderly male, appears comfortable at rest. HEENT: Conjunctiva and lids normal, wearing a mask. Neck: Supple, no elevated JVP or carotid bruits, no thyromegaly. Lungs: Clear to auscultation, nonlabored breathing at rest. Cardiac: Regular rate and rhythm, no S3, 2/6 systolic murmur, no pericardial rub. Extremities: No pitting edema.  ECG:  An ECG dated 03/11/2020 was personally reviewed today and demonstrated:  Ventricular paced rhythm.  Recent Labwork: 09/29/2019: B Natriuretic Peptide 1,057.0; Hemoglobin 13.2; Platelets 276; TSH 2.837 12/09/2019: BUN 17; Creatinine, Ser 1.71; Potassium 4.8; Sodium 142  November 2021: Potassium 5.0, BUN 18, creatinine 1.63  Other Studies Reviewed Today:  Echocardiogram 09/25/2019: 1. Left ventricular ejection fraction, by estimation, is 35%. The left  ventricle has moderately decreased function. The left ventricle  demonstrates global  hypokinesis. There is moderate left ventricular  hypertrophy. Left ventricular diastolic parameters  are consistent with Grade I diastolic dysfunction (impaired relaxation).  Elevated left atrial pressure.  2. The ventricular septum is flattened in diastole suggesting RV volume  overload. . Right ventricular systolic function was not well visualized.  The right ventricular size is not well visualized. There is moderately  elevated pulmonary artery systolic  pressure.  3. Left atrial size was mildly dilated.  4. Right atrial size was mildly dilated.  5. The mitral valve is abnormal. Mild mitral valve regurgitation. No  evidence of mitral stenosis.  6. Tricuspid valve regurgitation is mild to moderate.  7. The aortic valve is tricuspid. Aortic valve regurgitation is moderate.  Mild to moderate aortic valve stenosis.  8. There is mild to moderate pulmonary HTN, PASP  is 37 mmHg.  9. The inferior vena cava is normal in size with greater than 50%  respiratory variability, suggesting right atrial pressure of 3 mmHg.   Chest x-ray 02/06/2020: FINDINGS: LEFT subclavian ICD with leads projecting at RIGHT atrium, RIGHT ventricle, and coronary sinus, unchanged.  Enlargement of cardiac silhouette post CABG.  Pulmonary vascular congestion.  Mediastinal contours normal.  Persistent accentuation of interstitial markings throughout both lungs RIGHT greater than LEFT question mild pulmonary edema.  No pleural effusion or pneumothorax.  Osseous structures unremarkable.  IMPRESSION: Post CABG and ICD placement.  Enlargement of cardiac silhouette with vascular congestion and probable mild persistent pulmonary edema.  Assessment and Plan:  1.  Ischemic cardiomyopathy with chronic combined heart failure.  LVEF approximately 35% by last assessment.  He is clinically stable, no weight gain recently.  Continue Coreg, Entresto, and Lasix.  Follow-up echocardiogram will be  obtained.  2.  CAD status post CABG.  He does not describe any progressive angina symptoms, did have an isolated episode of left-sided chest discomfort earlier this month without recurrence.  Would continue medical therapy for now.  He is on aspirin, Norvasc, and Lipitor in addition to the above.  3.  St. Jude biventricular ICD in place.  He follows with Dr. Johney Frame and has a visit scheduled with EP later this week to address frequent episodes of PMT by most recent remote interrogation.  No device shocks or syncope.  4.  Aortic stenosis, mild to moderate by last echocardiogram.  Follow-up study is being obtained.  Medication Adjustments/Labs and Tests Ordered: Current medicines are reviewed at length with the patient today.  Concerns regarding medicines are outlined above.   Tests Ordered: Orders Placed This Encounter  Procedures  . ECHOCARDIOGRAM COMPLETE    Medication Changes: No orders of the defined types were placed in this encounter.   Disposition:  Follow up 6 months in the [place] office.  Signed, Jonelle Sidle, MD, [facility] 07/05/2020 9:49 AM    [facility] at [facility] [address] [place], [place], [place] [address] Phone: [phone]; Fax: [phone]

## 2020-07-05 ENCOUNTER — Encounter: Payer: Self-pay | Admitting: Cardiology

## 2020-07-05 ENCOUNTER — Other Ambulatory Visit: Payer: Self-pay

## 2020-07-05 ENCOUNTER — Ambulatory Visit (INDEPENDENT_AMBULATORY_CARE_PROVIDER_SITE_OTHER): Payer: Medicare PPO | Admitting: Cardiology

## 2020-07-05 VITALS — BP 122/78 | HR 63 | Ht 62.0 in | Wt 179.0 lb

## 2020-07-05 DIAGNOSIS — Z95810 Presence of automatic (implantable) cardiac defibrillator: Secondary | ICD-10-CM

## 2020-07-05 DIAGNOSIS — I350 Nonrheumatic aortic (valve) stenosis: Secondary | ICD-10-CM

## 2020-07-05 DIAGNOSIS — I255 Ischemic cardiomyopathy: Secondary | ICD-10-CM | POA: Diagnosis not present

## 2020-07-05 NOTE — Patient Instructions (Addendum)

## 2020-07-05 NOTE — Progress Notes (Addendum)
Cardiology Office Note Date:  07/08/2020  Patient ID:  Allen [name], Allen [name] 20-May-1934, MRN [id number] PCP:  [name], MD  Cardiologist:  Dr. Diona Browner Electrophysiologist: Dr. Johney Frame     Chief Complaint: PMTs on remote  History of Present Illness: Allen [name] is a 87 y.o. male with history of CAD (CABG in 2017, LIMA-LAD, SVG-D1, SVG-RCA, and seq SVG-OM1-OM2), HTN, HLD, ICM, LBBB, CRT-D, DM  He comes in today to be seen for Dr. Johney Frame, last seen by him April 2021.  Mentioned SOB on home O2, worse since his COVID infection.  Planned to see EP APP for device optimization and f/u with PMD as well for respiratory issues.  Optimization was completed with [place]. [place], [place], May 2021, subsequent subjective imprvement in symptoms/exertional capacity Another optimization visit Sept 2021  Most recently saw Dr. Diona Browner 07/05/20, had a single episode of CP, planned to follow clinically, felt to be euvolemic, h/o mild-mod AS planned to update his echo.  Device clinic noted frequent PMTs on remote and scheduled to come in.   TODAY He is accompanied by his wife today. He is doing well, mentions that he wanted to come in because he had been having a L sided posterior neck pain that seemed to radiate down his chest by his device and into his abdomen sometimes.  He wanted his deic checked.  Does say thought that he got a new pillow and has semed to help with no discomfort today or last day or so. Outside of that, he denies any CP, no palpitations or cardiac awareness. Since his COVID infection Dec 2020, over a year ago now, heis breathing has never been good again.  He remains with PRN O2 use. No nocturnal SOB No near syncope or syncope No shocks .   Device History: SJM ICD implanted 07/17/2018 for ICM, NYHA III CHF, SSS, and LBBB History of appropriate therapy: No History of AAD therapy: No   Past Medical History:  Diagnosis Date  . AICD (automatic cardioverter/defibrillator) present  07/17/2018   BIV  . CAD (coronary artery disease)    a. 04/2016: s/p CABG with LIMA-LAD, SVG-D1, SVG-RCA, and seq SVG-OM1-OM2  . Essential hypertension   . Hyperlipidemia   . Ischemic cardiomyopathy    LVEF 35% May 2016  . LBBB (left bundle branch block)   . Pneumonia due to COVID-19 virus   . Sinus bradycardia   . Type 2 diabetes mellitus (HCC)     Past Surgical History:  Procedure Laterality Date  . BIV ICD INSERTION CRT-D  07/17/2018  . BIV ICD INSERTION CRT-D N/A 07/17/2018   Procedure: BIV ICD INSERTION CRT-D;  Surgeon: Hillis Range, MD;  Location: [facility] INVASIVE CV LAB;  Service: Cardiovascular;  Laterality: N/A;  . CARDIAC CATHETERIZATION N/A 11/02/2014   Procedure: Left Heart Cath and Coronary Angiography;  Surgeon: Lyn Records, MD;  Location: [facility] INVASIVE CV LAB;  Service: Cardiovascular;  Laterality: N/A;  . CARDIAC CATHETERIZATION N/A 05/03/2016   Procedure: Left Heart Cath and Coronary Angiography;  Surgeon: Peter M Swaziland, MD;  Location: [facility] INVASIVE CV LAB;  Service: Cardiovascular;  Laterality: N/A;  . CORONARY ARTERY BYPASS GRAFT N/A 05/08/2016   Procedure: CORONARY ARTERY BYPASS GRAFTING (CABG) x 5;  Surgeon: Alleen Borne, MD;  Location: [facility] OR;  Service: Open Heart Surgery;  Laterality: N/A;  . ENDOVEIN HARVEST OF GREATER SAPHENOUS VEIN  05/08/2016   Procedure: ENDOVEIN HARVEST OF GREATER SAPHENOUS VEIN;  Surgeon: Alleen Borne, MD;  Location:  [facility] OR;  Service: Open Heart Surgery;;  . TEE WITHOUT CARDIOVERSION N/A 05/08/2016   Procedure: TRANSESOPHAGEAL ECHOCARDIOGRAM (TEE);  Surgeon: Alleen Borne, MD;  Location: [facility] OR;  Service: Open Heart Surgery;  Laterality: N/A;    Current Outpatient Medications  Medication Sig Dispense Refill  . amLODipine (NORVASC) 5 MG tablet Take 2 tablets by mouth daily.    Marland Kitchen aspirin 81 MG EC tablet Take 81 mg by mouth daily.    Marland Kitchen atorvastatin (LIPITOR) 80 MG tablet Take 1 tablet (80 mg total) by mouth daily at 6 PM. 90 tablet 3  .  budesonide-formoterol (SYMBICORT) 160-4.5 MCG/ACT inhaler Inhale 2 puffs into the lungs 2 (two) times daily.    . cholecalciferol (VITAMIN D3) 25 MCG (1000 UNIT) tablet Take 1,000 Units by mouth daily.    . clopidogrel (PLAVIX) 75 MG tablet TAKE 1 TABLET ONE TIME DAILY WITH BREAKFAST 90 tablet 0  . furosemide (LASIX) 20 MG tablet Take 20 mg by mouth daily as needed. Weight gain of 2-3 lbs in a 24 hour period or 5 lbs in one week    . Insulin Glargine (LANTUS SOLOSTAR) 100 UNIT/ML Solostar Pen Inject 20 Units into the skin at bedtime. 15 mL 11  . Insulin Lispro Junior KwikPen 100 UNIT/ML SOPN     . mirtazapine (REMERON) 15 MG tablet Take 15 mg by mouth daily at 12 noon.     . nitroGLYCERIN (NITROSTAT) 0.4 MG SL tablet Place 0.4 mg under the tongue every 5 (five) minutes as needed for chest pain (If no relief after 3rd dose, proceed to the ED).    . OXYGEN 3 lpm 24/7 DME- [facility]    . repaglinide (PRANDIN) 2 MG tablet Take 2 mg by mouth 2 (two) times daily before a meal.     . sacubitril-valsartan (ENTRESTO) 24-26 MG Take 1 tablet by mouth 2 (two) times daily. 180 tablet 2  . vitamin B-12 (CYANOCOBALAMIN) 1000 MCG tablet     . carvedilol (COREG) 6.25 MG tablet Take 1 tablet (6.25 mg total) by mouth 2 (two) times daily with a meal. 180 tablet 1   No current facility-administered medications for this visit.    Allergies:   Patient has no known allergies.   Social History:  The patient  reports that he quit smoking about 9 years ago. His smoking use included cigarettes. He started smoking about 64 years ago. He has a 28.00 pack-year smoking history. He has never used smokeless tobacco. He reports that he does not drink alcohol and does not use drugs.   Family History:  The patient's family history includes Coronary artery disease (age of onset: 51) in his brother; Unexplained death in his father and mother.  ROS:  Please see the history of present illness.    All other systems are reviewed and  otherwise negative.   PHYSICAL EXAM:  VS:  BP 140/70 (BP Location: Left Arm, Patient Position: Sitting, Cuff Size: Normal)   Pulse 60   Ht 5\' 2"  (1.575 m)   Wt 176 lb (79.8 kg)   BMI 32.19 kg/m  BMI: Body mass index is 32.19 kg/m. Well nourished, well developed, in no acute distress HEENT: normocephalic, atraumatic Neck: no JVD, carotid bruits or masses Cardiac:  RRR; 1-2+ blowing SM, no rubs, or gallops Lungs:  CTA b/l, no wheezing, rhonchi or rales Abd: soft, nontender MS: no deformity or atrophy Ext:  no edema Skin: warm and dry, no rash Neuro:  No gross deficits appreciated Psych: euthymic  mood, full affect  ICD site is stable, no tethering or discomfort   EKG:  Not done today   Device interrogation done today and reviewed by myself:  Battery and lead measurements are good PMTs noted. All of his EGMs (60) are reviewed Majority do not show onset of PMT, though the ones that do are triggered by PVCs, and all are treated successfully by the algorithm. >99%BP   10/27/19: Echo/AV opt IMPRESSIONS  1. Limited study for AV optimization; apical 4 chamber view only; severe  global reduction in LV systolic function; mild AI and MR.   FINDINGS  Aortic Valve: Aortic regurgitation PHT measures 724 msec.   Additional Comments: Limited study for AV optimization; apical 4 chamber  view only; severe global reduction in LV systolic function; mild AI and  MR.    09/25/19: TTE IMPRESSIONS  1. Left ventricular ejection fraction, by estimation, is 35%. The left  ventricle has moderately decreased function. The left ventricle  demonstrates global hypokinesis. There is moderate left ventricular  hypertrophy. Left ventricular diastolic parameters  are consistent with Grade I diastolic dysfunction (impaired relaxation).  Elevated left atrial pressure.  2. The ventricular septum is flattened in diastole suggesting RV volume  overload. . Right ventricular systolic function was not  well visualized.  The right ventricular size is not well visualized. There is moderately  elevated pulmonary artery systolic  pressure.  3. Left atrial size was mildly dilated.  4. Right atrial size was mildly dilated.  5. The mitral valve is abnormal. Mild mitral valve regurgitation. No  evidence of mitral stenosis.  6. Tricuspid valve regurgitation is mild to moderate.  7. The aortic valve is tricuspid. Aortic valve regurgitation is moderate.  Mild to moderate aortic valve stenosis.  8. There is mild to moderate pulmonary HTN, PASP is 37 mmHg.  9. The inferior vena cava is normal in size with greater than 50%  respiratory variability, suggesting right atrial pressure of 3 mmHg.   Comparison(s): Echocardiogram done 11/20/18 showed an EF of 35% with mild  to moderate AI.     05/03/2016: LHC  LV end diastolic pressure is mildly elevated.  Ostial LAD stenosis- 70 %stenosed.  Prox LAD to Mid LAD lesion, 35 %stenosed.  1st Diag lesion, 85 %stenosed.  Ost Cx to Prox Cx lesion, 95 %stenosed.  Ost 1st Mrg to 1st Mrg lesion, 70 %stenosed.  Ost 2nd Mrg lesion, 100 %stenosed.  Prox RCA-2 lesion, 80 %stenosed.  Prox RCA-1 lesion, 70 %stenosed.   1. Severe 3 vessel obstructive CAD. There is left main equivalent disease. Compared to prior study in May 2016 there is a ruptured plaque at the ostium of the LCx that extends into the ostial LAD. 2. Mildly elevated LVEDP  Plan: the patient is currently pain free and hemodynamically stable. Will transfer to ICU. Resume IV heparin tonight. Continue IV amiodarone. Hold Plavix. Will need CT surgery consult in the next couple of days for consideration of CABG. Anatomy is not favorable for PCI. Will assess LV function with Echo.   Recent Labs: 09/29/2019: B Natriuretic Peptide 1,057.0; Hemoglobin 13.2; Platelets 276; TSH 2.837 12/09/2019: BUN 17; Creatinine, Ser 1.71; Potassium 4.8; Sodium 142  No results found for requested labs within  last 8760 hours.   CrCl cannot be calculated (Patient's most recent lab result is older than the maximum 21 days allowed.).   Wt Readings from Last 3 Encounters:  07/08/20 176 lb (79.8 kg)  07/05/20 179 lb (81.2 kg)  03/11/20 180 lb  6.4 oz (81.8 kg)     Other studies reviewed: Additional studies/records reviewed today include: summarized above  ASSESSMENT AND PLAN:  1. ICD     Intact function, no programming changes made  PMT's He is already known to have retrograde conduction and PMTs Retrograde conduction has previously been measured at 403ms  PVARP is at 275ms He has had a number of device optimization programming changes last year In d/w industry, discussed turning PVC repsponse on. This may produce some prloonged compensatory pauses and interruption in timing cycles possibly reducing BV pacing.   Given the PMT algorithm is working, for now given PVCs seem to be the provoking issue, I have opted to increase his coreg, check his electrolytes. Follow PMT burden remotely. He lives in [place]. Will have him back in a couple months.    2. ICM 3. Chronic CHF 4. LBBB     Weight is stable/down a couple pounds, Corvue looks OK, at/just above threshold.     No exam findings to suggest volume OL     >99% BP     On BB, entresto, lasix     BMET today     C/w Dr. Diona BrownerMcDowell  5. CAD     no anginal symptoms     On ASA, plavix, BB, statin     C/w dr. Diona BrownerMcDowell  6. HTN     Room for more coreg  Disposition: F/u with remotes, see him back in 2 mo, sooner if needed.  Current medicines are reviewed at length with the patient today.  The patient did not have any concerns regarding medicines.  Norma FredricksonSigned, Tye Vigo, PA-C 07/08/2020 1:18 PM     [facility] [address] [place] [address] [phone] (office)  [phone] (fax)

## 2020-07-07 DIAGNOSIS — J449 Chronic obstructive pulmonary disease, unspecified: Secondary | ICD-10-CM | POA: Diagnosis not present

## 2020-07-08 ENCOUNTER — Encounter: Payer: Self-pay | Admitting: Physician Assistant

## 2020-07-08 ENCOUNTER — Other Ambulatory Visit: Payer: Self-pay

## 2020-07-08 ENCOUNTER — Ambulatory Visit (INDEPENDENT_AMBULATORY_CARE_PROVIDER_SITE_OTHER): Payer: Medicare PPO | Admitting: Physician Assistant

## 2020-07-08 VITALS — BP 140/70 | HR 60 | Ht 62.0 in | Wt 176.0 lb

## 2020-07-08 DIAGNOSIS — Z95810 Presence of automatic (implantable) cardiac defibrillator: Secondary | ICD-10-CM | POA: Diagnosis not present

## 2020-07-08 DIAGNOSIS — I2510 Atherosclerotic heart disease of native coronary artery without angina pectoris: Secondary | ICD-10-CM

## 2020-07-08 DIAGNOSIS — I10 Essential (primary) hypertension: Secondary | ICD-10-CM

## 2020-07-08 DIAGNOSIS — I519 Heart disease, unspecified: Secondary | ICD-10-CM

## 2020-07-08 DIAGNOSIS — I255 Ischemic cardiomyopathy: Secondary | ICD-10-CM | POA: Diagnosis not present

## 2020-07-08 DIAGNOSIS — I447 Left bundle-branch block, unspecified: Secondary | ICD-10-CM

## 2020-07-08 DIAGNOSIS — I499 Cardiac arrhythmia, unspecified: Secondary | ICD-10-CM | POA: Diagnosis not present

## 2020-07-08 DIAGNOSIS — Z79899 Other long term (current) drug therapy: Secondary | ICD-10-CM

## 2020-07-08 LAB — CUP PACEART INCLINIC DEVICE CHECK
Battery Remaining Longevity: 58 mo
Brady Statistic RA Percent Paced: 88 %
Brady Statistic RV Percent Paced: 99.16 %
Date Time Interrogation Session: 20220127175419
HighPow Impedance: 73.125
Implantable Lead Implant Date: 20200205
Implantable Lead Implant Date: 20200205
Implantable Lead Implant Date: 20200205
Implantable Lead Location: 753858
Implantable Lead Location: 753859
Implantable Lead Location: 753860
Implantable Pulse Generator Implant Date: 20200205
Lead Channel Impedance Value: 1075 Ohm
Lead Channel Impedance Value: 462.5 Ohm
Lead Channel Impedance Value: 562.5 Ohm
Lead Channel Pacing Threshold Amplitude: 0.5 V
Lead Channel Pacing Threshold Amplitude: 1 V
Lead Channel Pacing Threshold Amplitude: 1 V
Lead Channel Pacing Threshold Amplitude: 1 V
Lead Channel Pacing Threshold Amplitude: 1 V
Lead Channel Pacing Threshold Pulse Width: 0.5 ms
Lead Channel Pacing Threshold Pulse Width: 0.5 ms
Lead Channel Pacing Threshold Pulse Width: 0.5 ms
Lead Channel Pacing Threshold Pulse Width: 0.5 ms
Lead Channel Pacing Threshold Pulse Width: 0.5 ms
Lead Channel Sensing Intrinsic Amplitude: 12 mV
Lead Channel Sensing Intrinsic Amplitude: 4 mV
Lead Channel Setting Pacing Amplitude: 2 V
Lead Channel Setting Pacing Amplitude: 2 V
Lead Channel Setting Pacing Amplitude: 2.5 V
Lead Channel Setting Pacing Pulse Width: 0.5 ms
Lead Channel Setting Pacing Pulse Width: 0.5 ms
Lead Channel Setting Sensing Sensitivity: 0.5 mV
Pulse Gen Serial Number: 9880571

## 2020-07-08 LAB — MAGNESIUM: Magnesium: 2.1 mg/dL (ref 1.6–2.3)

## 2020-07-08 LAB — BASIC METABOLIC PANEL
BUN/Creatinine Ratio: 14 (ref 10–24)
BUN: 20 mg/dL (ref 8–27)
CO2: 22 mmol/L (ref 20–29)
Calcium: 11.3 mg/dL — ABNORMAL HIGH (ref 8.6–10.2)
Chloride: 106 mmol/L (ref 96–106)
Creatinine, Ser: 1.46 mg/dL — ABNORMAL HIGH (ref 0.76–1.27)
GFR calc Af Amer: 50 mL/min/{1.73_m2} — ABNORMAL LOW (ref >59)
GFR calc non Af Amer: 44 mL/min/{1.73_m2} — ABNORMAL LOW (ref >59)
Glucose: 94 mg/dL (ref 65–99)
Potassium: 4.7 mmol/L (ref 3.5–5.2)
Sodium: 142 mmol/L (ref 134–144)

## 2020-07-08 MED ORDER — CARVEDILOL 6.25 MG PO TABS
6.2500 mg | ORAL_TABLET | Freq: Two times a day (BID) | ORAL | 1 refills | Status: DC
Start: 2020-07-08 — End: 2021-06-15

## 2020-07-08 NOTE — Patient Instructions (Signed)
Medication Instructions:     START TAKING COREG 6.25 MG TWICE A DAY    *If you need a refill on your cardiac medications before your next appointment, please call your pharmacy*   Lab Work: BMET AND MAG TODAY   If you have labs (blood work) drawn today and your tests are completely normal, you will receive your results only by: Marland Kitchen MyChart Message (if you have MyChart) OR . A paper copy in the mail If you have any lab test that is abnormal or we need to change your treatment, we will call you to review the results.   Testing/Procedures: NONE ORDERED  TODAY   Follow-Up: At [facility], you and your health needs are our priority.  As part of our continuing mission to provide you with exceptional heart care, we have created designated Provider Care Teams.  These Care Teams include your primary Cardiologist (physician) and Advanced Practice Providers (APPs -  Physician Assistants and Nurse Practitioners) who all work together to provide you with the care you need, when you need it.  We recommend signing up for the patient portal called "MyChart".  Sign up information is provided on this After Visit Summary.  MyChart is used to connect with patients for Virtual Visits (Telemedicine).  Patients are able to view lab/test results, encounter notes, upcoming appointments, etc.  Non-urgent messages can be sent to your provider as well.   To learn more about what you can do with MyChart, go to [email or web address].    Your next appointment:   3 month(s)  The format for your next appointment:   In Person  Provider:   You may see Hillis Range, MD or one of the following Advanced Practice Providers on your designated Care Team:    Gypsy Balsam, NP  Francis Dowse, PA-C  Casimiro Needle "[name], [place]    Other Instructions

## 2020-07-12 ENCOUNTER — Telehealth: Payer: Self-pay | Admitting: Physician Assistant

## 2020-07-12 NOTE — Telephone Encounter (Signed)
New message:     Patient niece calling to ask some questions concering some medication changes.

## 2020-07-12 NOTE — Telephone Encounter (Signed)
I left message for Allen [name] the pts niece to call our office back.

## 2020-07-14 NOTE — Telephone Encounter (Signed)
Spoke with Jasmine December, Patients niece and confirmed recent medication dose change for Coreg:   START TAKING COREG 6.25 MG TWICE A DAY   Jasmine December verbalized understanding and will make sure patient is aware as he was confused previously.

## 2020-07-14 NOTE — Telephone Encounter (Signed)
Follow up:       Patient niece is calling to check the status.

## 2020-07-19 ENCOUNTER — Ambulatory Visit (INDEPENDENT_AMBULATORY_CARE_PROVIDER_SITE_OTHER): Payer: Medicare PPO

## 2020-07-19 DIAGNOSIS — I5042 Chronic combined systolic (congestive) and diastolic (congestive) heart failure: Secondary | ICD-10-CM

## 2020-07-19 DIAGNOSIS — I447 Left bundle-branch block, unspecified: Secondary | ICD-10-CM

## 2020-07-20 ENCOUNTER — Ambulatory Visit (INDEPENDENT_AMBULATORY_CARE_PROVIDER_SITE_OTHER): Payer: Medicare PPO

## 2020-07-20 DIAGNOSIS — I255 Ischemic cardiomyopathy: Secondary | ICD-10-CM | POA: Diagnosis not present

## 2020-07-20 LAB — ECHOCARDIOGRAM COMPLETE
AR max vel: 1.22 cm2
AV Area VTI: 1.54 cm2
AV Area mean vel: 1.23 cm2
AV Mean grad: 11.3 mmHg
AV Peak grad: 20.4 mmHg
Ao pk vel: 2.26 m/s
Area-P 1/2: 4.21 cm2
Calc EF: 34.9 %
MV M vel: 5.09 m/s
MV Peak grad: 103.6 mmHg
P 1/2 time: 739 msec
S' Lateral: 4.94 cm
Single Plane A2C EF: 37.7 %
Single Plane A4C EF: 32.6 %

## 2020-07-21 LAB — CUP PACEART REMOTE DEVICE CHECK
Battery Remaining Longevity: 58 mo
Battery Remaining Percentage: 67 %
Battery Voltage: 2.96 V
Brady Statistic AP VP Percent: 91 %
Brady Statistic AP VS Percent: 1 %
Brady Statistic AS VP Percent: 8.1 %
Brady Statistic AS VS Percent: 1 %
Brady Statistic RA Percent Paced: 91 %
Date Time Interrogation Session: 20220207020017
HighPow Impedance: 62 Ohm
HighPow Impedance: 62 Ohm
Implantable Lead Implant Date: 20200205
Implantable Lead Implant Date: 20200205
Implantable Lead Implant Date: 20200205
Implantable Lead Location: 753858
Implantable Lead Location: 753859
Implantable Lead Location: 753860
Implantable Pulse Generator Implant Date: 20200205
Lead Channel Impedance Value: 430 Ohm
Lead Channel Impedance Value: 450 Ohm
Lead Channel Impedance Value: 950 Ohm
Lead Channel Pacing Threshold Amplitude: 0.625 V
Lead Channel Pacing Threshold Amplitude: 1 V
Lead Channel Pacing Threshold Amplitude: 1 V
Lead Channel Pacing Threshold Pulse Width: 0.5 ms
Lead Channel Pacing Threshold Pulse Width: 0.5 ms
Lead Channel Pacing Threshold Pulse Width: 0.5 ms
Lead Channel Sensing Intrinsic Amplitude: 12 mV
Lead Channel Sensing Intrinsic Amplitude: 2.7 mV
Lead Channel Setting Pacing Amplitude: 2 V
Lead Channel Setting Pacing Amplitude: 2 V
Lead Channel Setting Pacing Amplitude: 2.5 V
Lead Channel Setting Pacing Pulse Width: 0.5 ms
Lead Channel Setting Pacing Pulse Width: 0.5 ms
Lead Channel Setting Sensing Sensitivity: 0.5 mV
Pulse Gen Serial Number: 9880571

## 2020-07-23 NOTE — Progress Notes (Signed)
Remote ICD transmission.   

## 2020-07-26 ENCOUNTER — Telehealth: Payer: Self-pay | Admitting: *Deleted

## 2020-07-26 NOTE — Telephone Encounter (Signed)
-----   Message from Jonelle Sidle, MD sent at 07/20/2020  5:26 PM EST ----- Results reviewed.  Overall stable findings with LVEF 35%, mild to moderate aortic stenosis and mild to moderate aortic regurgitation.  Continue with current medications and follow-up plan.

## 2020-07-26 NOTE — Telephone Encounter (Signed)
Pt voiced understanding

## 2020-07-29 ENCOUNTER — Other Ambulatory Visit: Payer: Self-pay | Admitting: Cardiology

## 2020-07-29 DIAGNOSIS — J449 Chronic obstructive pulmonary disease, unspecified: Secondary | ICD-10-CM | POA: Diagnosis not present

## 2020-08-07 DIAGNOSIS — J449 Chronic obstructive pulmonary disease, unspecified: Secondary | ICD-10-CM | POA: Diagnosis not present

## 2020-08-08 ENCOUNTER — Other Ambulatory Visit: Payer: Self-pay | Admitting: Cardiology

## 2020-08-19 ENCOUNTER — Telehealth: Payer: Self-pay | Admitting: Cardiology

## 2020-08-19 NOTE — Telephone Encounter (Signed)
Reports SOB with little exertion and dizziness for the past 1-2 months. Denies chest pain or swelling. Does not do daily weights. Currently on 2L O2 via [place] with O2 Sat 93%. Vitals checked today and reports normal but unable to give value. Reports staying well hydrated and eating well. Medications reviewed. Advised that message would be sent to provider for possible diuretic adjustment. Advised that he needed to weigh daily and record weight as well as record BP and HR. Advised if symptoms got worse, to go to the ED for an evaluation. Verbalized understanding.

## 2020-08-19 NOTE — Telephone Encounter (Signed)
Patient called requesting to speak with nurse in regards to his current medications.

## 2020-08-19 NOTE — Telephone Encounter (Signed)
Thank you Isabelle Course.  I agree, it is hard to make medication adjustment recommendations without more information.  Daily weights would be very useful to help me understand whether we need to modify his diuretics.  Would encourage him to track weights daily along with vital signs.

## 2020-08-19 NOTE — Telephone Encounter (Signed)
Patient informed and verbalized understanding of plan.  Will update [place] on Monday with values of weight and vitals.

## 2020-08-26 DIAGNOSIS — J449 Chronic obstructive pulmonary disease, unspecified: Secondary | ICD-10-CM | POA: Diagnosis not present

## 2020-09-04 DIAGNOSIS — J449 Chronic obstructive pulmonary disease, unspecified: Secondary | ICD-10-CM | POA: Diagnosis not present

## 2020-09-08 DIAGNOSIS — J449 Chronic obstructive pulmonary disease, unspecified: Secondary | ICD-10-CM | POA: Diagnosis not present

## 2020-09-08 DIAGNOSIS — N1831 Chronic kidney disease, stage 3a: Secondary | ICD-10-CM | POA: Diagnosis not present

## 2020-09-08 DIAGNOSIS — E1121 Type 2 diabetes mellitus with diabetic nephropathy: Secondary | ICD-10-CM | POA: Diagnosis not present

## 2020-09-08 DIAGNOSIS — I5022 Chronic systolic (congestive) heart failure: Secondary | ICD-10-CM | POA: Diagnosis not present

## 2020-09-22 DIAGNOSIS — I5022 Chronic systolic (congestive) heart failure: Secondary | ICD-10-CM | POA: Diagnosis not present

## 2020-09-22 DIAGNOSIS — J449 Chronic obstructive pulmonary disease, unspecified: Secondary | ICD-10-CM | POA: Diagnosis not present

## 2020-09-22 DIAGNOSIS — E1121 Type 2 diabetes mellitus with diabetic nephropathy: Secondary | ICD-10-CM | POA: Diagnosis not present

## 2020-09-22 DIAGNOSIS — N1831 Chronic kidney disease, stage 3a: Secondary | ICD-10-CM | POA: Diagnosis not present

## 2020-09-22 DIAGNOSIS — Z6828 Body mass index (BMI) 28.0-28.9, adult: Secondary | ICD-10-CM | POA: Diagnosis not present

## 2020-09-26 DIAGNOSIS — J449 Chronic obstructive pulmonary disease, unspecified: Secondary | ICD-10-CM | POA: Diagnosis not present

## 2020-10-05 DIAGNOSIS — J449 Chronic obstructive pulmonary disease, unspecified: Secondary | ICD-10-CM | POA: Diagnosis not present

## 2020-10-13 ENCOUNTER — Ambulatory Visit (INDEPENDENT_AMBULATORY_CARE_PROVIDER_SITE_OTHER): Payer: Medicare PPO | Admitting: Internal Medicine

## 2020-10-13 ENCOUNTER — Other Ambulatory Visit: Payer: Self-pay

## 2020-10-13 ENCOUNTER — Encounter: Payer: Self-pay | Admitting: Internal Medicine

## 2020-10-13 VITALS — BP 126/82 | HR 70 | Ht 62.0 in | Wt 175.4 lb

## 2020-10-13 DIAGNOSIS — R0602 Shortness of breath: Secondary | ICD-10-CM | POA: Diagnosis not present

## 2020-10-13 DIAGNOSIS — I2510 Atherosclerotic heart disease of native coronary artery without angina pectoris: Secondary | ICD-10-CM | POA: Diagnosis not present

## 2020-10-13 DIAGNOSIS — I10 Essential (primary) hypertension: Secondary | ICD-10-CM | POA: Diagnosis not present

## 2020-10-13 DIAGNOSIS — I519 Heart disease, unspecified: Secondary | ICD-10-CM | POA: Diagnosis not present

## 2020-10-13 DIAGNOSIS — I255 Ischemic cardiomyopathy: Secondary | ICD-10-CM

## 2020-10-13 DIAGNOSIS — R001 Bradycardia, unspecified: Secondary | ICD-10-CM | POA: Diagnosis not present

## 2020-10-13 MED ORDER — ENTRESTO 49-51 MG PO TABS: 1.0000 | ORAL_TABLET | Freq: Two times a day (BID) | ORAL | 3 refills | Status: DC

## 2020-10-13 NOTE — Patient Instructions (Addendum)
Medication Instructions:  Entresto 49/51 mg two times daily Your physician recommends that you continue on your current medications as directed. Please refer to the Current Medication list given to you today.  Labwork: None ordered.  Testing/Procedures: None ordered.  Follow-Up: Your physician wants you to follow-up in: 01/13/21 at 10:30 with    Casimiro Needle "Mardelle Matte" Lanna Poche, PA-C    Remote monitoring is used to monitor your Pacemaker of ICD from home. This monitoring reduces the number of office visits required to check your device to one time per year. It allows [place] to keep an eye on the functioning of your device to ensure it is working properly. You are scheduled for a device check from home on 10/18/20. You may send your transmission at any time that day. If you have a wireless device, the transmission will be sent automatically. After your physician reviews your transmission, you will receive a postcard with your next transmission date.  Any Other Special Instructions Will Be Listed Below (If Applicable).  If you need a refill on your cardiac medications before your next appointment, please call your pharmacy.     Dear Patient:  The [facility] device clinic team is grateful for the opportunity to partner with you in your implanted pacemaker/ defibrillator needs.  Our goals are to provide the most up-to-date and comprehensive care for you and your implanted device.    Randon Goldsmith is a specially trained registered nurse who will follow your device heart failure diagnostics.  She will call you weekly following discharge and review your medications as well as your daily weights and breathing status.  During the call, she will review with you the results of your recent device interrogation.    We believe that this program will allow [place] to better care for you.  Multiple studies have shown that remote monitoring can help to decrease hospitalizations as well as improve  quality of life and decrease your risks of dying.  Through this program, we hope to partner with you to optimize your cardiac care.   Once again, we would like to thank you for allowing [place] to participate in your care.  If you have any questions, please call the office at [phone].    Sincerely,   [facility] Device Clinic Team and Advanced Heart Failure Team

## 2020-10-13 NOTE — Progress Notes (Signed)
PCP: [name], MD Primary Cardiologist: Dr Diona Browner Primary EP: Dr Johney Frame  Allen [name] is a 87 y.o. male who presents today for routine electrophysiology followup.  Since last being seen in our clinic, the patient reports doing reasonably well.  He has NYHA Class III symptoms despite CRT optimization.  He reports SOB with activity.  He has oxygen but does not frequently use it.  Today, he denies symptoms of palpitations, chest pain, lower extremity edema, dizziness, presyncope, syncope, or ICD shocks.  The patient is otherwise without complaint today.   Past Medical History:  Diagnosis Date  . AICD (automatic cardioverter/defibrillator) present 07/17/2018   BIV  . CAD (coronary artery disease)    a. 04/2016: s/p CABG with LIMA-LAD, SVG-D1, SVG-RCA, and seq SVG-OM1-OM2  . Essential hypertension   . Hyperlipidemia   . Ischemic cardiomyopathy    LVEF 35% May 2016  . LBBB (left bundle branch block)   . Pneumonia due to COVID-19 virus   . Sinus bradycardia   . Type 2 diabetes mellitus (HCC)    Past Surgical History:  Procedure Laterality Date  . BIV ICD INSERTION CRT-D  07/17/2018  . BIV ICD INSERTION CRT-D N/A 07/17/2018   Procedure: BIV ICD INSERTION CRT-D;  Surgeon: Hillis Range, MD;  [facility] INVASIVE CV LAB;  Service: Cardiovascular;  Laterality: N/A;  . CARDIAC CATHETERIZATION N/A 11/02/2014   Procedure: Left Heart Cath and Coronary Angiography;  Surgeon: Lyn Records, MD;  Location: [facility] INVASIVE CV LAB;  Service: Cardiovascular;  Laterality: N/A;  . CARDIAC CATHETERIZATION N/A 05/03/2016   Procedure: Left Heart Cath and Coronary Angiography;  Surgeon: Peter M Swaziland, MD;  Location: [facility] INVASIVE CV LAB;  Service: Cardiovascular;  Laterality: N/A;  . CORONARY ARTERY BYPASS GRAFT N/A 05/08/2016   Procedure: CORONARY ARTERY BYPASS GRAFTING (CABG) x 5;  Surgeon: Alleen Borne, MD;  Location: [facility] OR;  Service: Open Heart Surgery;  Laterality: N/A;  . ENDOVEIN HARVEST OF  GREATER SAPHENOUS VEIN  05/08/2016   Procedure: ENDOVEIN HARVEST OF GREATER SAPHENOUS VEIN;  Surgeon: Alleen Borne, MD;  Location: [facility] OR;  Service: Open Heart Surgery;;  . TEE WITHOUT CARDIOVERSION N/A 05/08/2016   Procedure: TRANSESOPHAGEAL ECHOCARDIOGRAM (TEE);  Surgeon: Alleen Borne, MD;  Location: [facility] OR;  Service: Open Heart Surgery;  Laterality: N/A;    ROS- all systems are reviewed and negative except as per HPI above  Current Outpatient Medications  Medication Sig Dispense Refill  . amLODipine (NORVASC) 5 MG tablet Take 2 tablets by mouth daily.    Marland Kitchen aspirin 81 MG EC tablet Take 81 mg by mouth daily.    Marland Kitchen atorvastatin (LIPITOR) 80 MG tablet Take 1 tablet (80 mg total) by mouth daily at 6 PM. 90 tablet 3  . budesonide-formoterol (SYMBICORT) 160-4.5 MCG/ACT inhaler Inhale 2 puffs into the lungs 2 (two) times daily.    . carvedilol (COREG) 6.25 MG tablet Take 1 tablet (6.25 mg total) by mouth 2 (two) times daily with a meal. 180 tablet 1  . cholecalciferol (VITAMIN D3) 25 MCG (1000 UNIT) tablet Take 1,000 Units by mouth daily.    . clopidogrel (PLAVIX) 75 MG tablet TAKE 1 TABLET ONE TIME DAILY WITH BREAKFAST 90 tablet 1  . furosemide (LASIX) 20 MG tablet Take 20 mg by mouth daily as needed. Weight gain of 2-3 lbs in a 24 hour period or 5 lbs in one week    . Insulin Glargine (LANTUS SOLOSTAR) 100 UNIT/ML Solostar Pen Inject  20 Units into the skin at bedtime. 15 mL 11  . Insulin Lispro Junior KwikPen 100 UNIT/ML SOPN     . mirtazapine (REMERON) 15 MG tablet Take 15 mg by mouth daily at 12 noon.     . nitroGLYCERIN (NITROSTAT) 0.4 MG SL tablet Place 0.4 mg under the tongue every 5 (five) minutes as needed for chest pain (If no relief after 3rd dose, proceed to the ED).    . OXYGEN 3 lpm 24/7 DME- [employer]    . repaglinide (PRANDIN) 2 MG tablet Take 2 mg by mouth 2 (two) times daily before a meal.     . sacubitril-valsartan (ENTRESTO) 24-26 MG Take 1 tablet by mouth 2 (two) times daily.  180 tablet 2  . vitamin B-12 (CYANOCOBALAMIN) 1000 MCG tablet      No current facility-administered medications for this visit.    Physical Exam: Vitals:   10/13/20 1056  BP: 126/82  Pulse: 70  SpO2: (!) 88%  Weight: 175 lb 6.4 oz (79.6 kg)  Height: 5\' 2"  (1.575 m)    GEN- The patient is elderly appearing, alert and oriented x 3 today.   Head- normocephalic, atraumatic Eyes-  Sclera clear, conjunctiva pink Ears- hearing intact Oropharynx- clear Lungs- Clear to ausculation bilaterally, normal work of breathing Chest- ICD pocket is well healed Heart- Regular rate and rhythm, no murmurs, rubs or gallops, PMI not laterally displaced GI- soft, NT, ND, + BS Extremities- no clubbing, cyanosis, or edema  ICD interrogation- reviewed in detail today,  See PACEART report  ekg tracing ordered today is personally reviewed and shows sinus with biV pacing  Wt Readings from Last 3 Encounters:  10/13/20 175 lb 6.4 oz (79.6 kg)  07/08/20 176 lb (79.8 kg)  07/05/20 179 lb (81.2 kg)    Assessment and Plan:  1.  Chronic systolic dysfunction/ ischemic CM/ LBBB/ CAD euvolemic today but with some coreview crissings.  He has NYHA Class III symptoms with frequent SOB requiring O2.  Normal BiV ICD function See Pace Art report Continues to have PMT on device interrogation PVARP increased from 275 to 375 msec.  he is not device dependant today Not currently followed in ICM device clinic he is very interested.  I will therefore enroll in ICM clinic with 07/07/20  Device optimization performed by Randon Goldsmith 12/09/19 (note reviewed)  He has PVCs but is BiV pacing 96% at this time  Increase entresto today Consider BAT therapy if a candidate.   BMET, Pro BNP today.  Follow-up with 12/11/19 in 3 months  Consider farxiga once entresto is optimized  2. HTN Stable No change required today   Risks, benefits and potential toxicities for medications prescribed and/or refilled reviewed  with patient today.   Return to see EP PA in 3 months  [name] MD, [facility] 10/13/2020 11:11 AM

## 2020-10-14 LAB — BASIC METABOLIC PANEL
BUN/Creatinine Ratio: 10 (ref 10–24)
BUN: 16 mg/dL (ref 8–27)
CO2: 22 mmol/L (ref 20–29)
Calcium: 10.5 mg/dL — ABNORMAL HIGH (ref 8.6–10.2)
Chloride: 107 mmol/L — ABNORMAL HIGH (ref 96–106)
Creatinine, Ser: 1.61 mg/dL — ABNORMAL HIGH (ref 0.76–1.27)
Glucose: 98 mg/dL (ref 65–99)
Potassium: 4.8 mmol/L (ref 3.5–5.2)
Sodium: 144 mmol/L (ref 134–144)
eGFR: 42 mL/min/{1.73_m2} — ABNORMAL LOW (ref >59)

## 2020-10-14 LAB — PRO B NATRIURETIC PEPTIDE: NT-Pro BNP: 935 pg/mL — ABNORMAL HIGH (ref 0–486)

## 2020-10-18 ENCOUNTER — Ambulatory Visit (INDEPENDENT_AMBULATORY_CARE_PROVIDER_SITE_OTHER): Payer: Medicare PPO

## 2020-10-18 DIAGNOSIS — I255 Ischemic cardiomyopathy: Secondary | ICD-10-CM | POA: Diagnosis not present

## 2020-10-18 LAB — CUP PACEART REMOTE DEVICE CHECK
Battery Remaining Longevity: 56 mo
Battery Remaining Percentage: 64 %
Battery Voltage: 2.95 V
Brady Statistic AP VP Percent: 84 %
Brady Statistic AP VS Percent: 1.6 %
Brady Statistic AS VP Percent: 7.5 %
Brady Statistic AS VS Percent: 1 %
Brady Statistic RA Percent Paced: 77 %
Date Time Interrogation Session: 20220509020021
HighPow Impedance: 64 Ohm
HighPow Impedance: 64 Ohm
Implantable Lead Implant Date: 20200205
Implantable Lead Implant Date: 20200205
Implantable Lead Implant Date: 20200205
Implantable Lead Location: 753858
Implantable Lead Location: 753859
Implantable Lead Location: 753860
Implantable Pulse Generator Implant Date: 20200205
Lead Channel Impedance Value: 440 Ohm
Lead Channel Impedance Value: 510 Ohm
Lead Channel Impedance Value: 960 Ohm
Lead Channel Pacing Threshold Amplitude: 0.625 V
Lead Channel Pacing Threshold Amplitude: 0.875 V
Lead Channel Pacing Threshold Amplitude: 1 V
Lead Channel Pacing Threshold Pulse Width: 0.5 ms
Lead Channel Pacing Threshold Pulse Width: 0.5 ms
Lead Channel Pacing Threshold Pulse Width: 0.5 ms
Lead Channel Sensing Intrinsic Amplitude: 12 mV
Lead Channel Sensing Intrinsic Amplitude: 4.1 mV
Lead Channel Setting Pacing Amplitude: 1.875
Lead Channel Setting Pacing Amplitude: 2 V
Lead Channel Setting Pacing Amplitude: 2.5 V
Lead Channel Setting Pacing Pulse Width: 0.5 ms
Lead Channel Setting Pacing Pulse Width: 0.5 ms
Lead Channel Setting Sensing Sensitivity: 0.5 mV
Pulse Gen Serial Number: 9880571

## 2020-10-19 ENCOUNTER — Telehealth: Payer: Self-pay

## 2020-10-19 NOTE — Telephone Encounter (Signed)
Attempted ICM Intro call to patient and recording stated person is not accepting messages at this time.  Unable to leave message for call back

## 2020-10-19 NOTE — Telephone Encounter (Signed)
-----   Message from Hillis Range, MD sent at 10/13/2020 11:38 AM EDT ----- Please enroll in [facility] clinic

## 2020-10-21 DIAGNOSIS — M810 Age-related osteoporosis without current pathological fracture: Secondary | ICD-10-CM | POA: Diagnosis not present

## 2020-10-26 ENCOUNTER — Other Ambulatory Visit: Payer: Self-pay

## 2020-10-26 MED ORDER — ENTRESTO 49-51 MG PO TABS: 1.0000 | ORAL_TABLET | Freq: Two times a day (BID) | ORAL | 3 refills | Status: DC

## 2020-11-03 NOTE — Telephone Encounter (Signed)
Spoke with patient and provided ICM intro.  Patient agreed to monthly ICM follow up.  He reports being more Brietta Manso of breath since 10/13/20 visit with Dr Johney Frame.  Assisted in sending remote transmission.  He takes Furosemide 5 - 6 days a week instead of PRN.  He does not have any swelling or weight gain.  He weighs daily and no change in weight for past week.  Dr Johney Frame and Dr Diona Browner are his cardiologists.    Advised if shortness of breath continues to call Dr McDowell's office for make an appointment or use ER if needed.  He verbalized understanding.    1st ICM remote scheduled for 11/11/2020  11/03/2020 CorVue thoracic impedance suggesting possible dryness starting 10/31/2020.  3 month trend:

## 2020-11-04 DIAGNOSIS — J449 Chronic obstructive pulmonary disease, unspecified: Secondary | ICD-10-CM | POA: Diagnosis not present

## 2020-11-05 NOTE — Progress Notes (Signed)
Remote ICD transmission.   

## 2020-11-08 DIAGNOSIS — E1121 Type 2 diabetes mellitus with diabetic nephropathy: Secondary | ICD-10-CM | POA: Diagnosis not present

## 2020-11-08 DIAGNOSIS — N1831 Chronic kidney disease, stage 3a: Secondary | ICD-10-CM | POA: Diagnosis not present

## 2020-11-08 DIAGNOSIS — I5022 Chronic systolic (congestive) heart failure: Secondary | ICD-10-CM | POA: Diagnosis not present

## 2020-11-08 DIAGNOSIS — J449 Chronic obstructive pulmonary disease, unspecified: Secondary | ICD-10-CM | POA: Diagnosis not present

## 2020-11-12 ENCOUNTER — Ambulatory Visit (INDEPENDENT_AMBULATORY_CARE_PROVIDER_SITE_OTHER): Payer: Medicare PPO

## 2020-11-12 DIAGNOSIS — I5042 Chronic combined systolic (congestive) and diastolic (congestive) heart failure: Secondary | ICD-10-CM | POA: Diagnosis not present

## 2020-11-12 DIAGNOSIS — Z95810 Presence of automatic (implantable) cardiac defibrillator: Secondary | ICD-10-CM | POA: Diagnosis not present

## 2020-11-12 DIAGNOSIS — J449 Chronic obstructive pulmonary disease, unspecified: Secondary | ICD-10-CM | POA: Diagnosis not present

## 2020-11-12 NOTE — Progress Notes (Signed)
EPIC Encounter for ICM Monitoring  Patient Name: Allen [name] is a 87 y.o. male Date: 11/12/2020 Primary Care Physican: [name], MD Primary Cardiologist: Diona Browner Electrophysiologist: Allred Bi-V Pacing: 93%  Weight:  Unknown (not at home at time of call)  AT/AF Burden <1%         Heart Failure questions reviewed.  Pt asymptomatic for fluid. He reports weight is stable, no SOB or lower extremity swelling.  Pt reports he takes PRN Furosemide several times a week.   CorVue thoracic impedance suggesting possible fluid accumulation starting 11/09/20 but trending back toward baseline normal.   Message sent 6/3 to Device clinic to review last 2 reports for AT/AF burden of <1%.    Prescribed: Furosemide 20 mg Take 1 tablet by mouth daily as needed. Weight gain of 2-3 lbs in a 24 hour period or 5 lbs in one week.  Labs: 05/0/2022 Creatinine 1.61, BUN 16, Potassium 4.8, Sodium 144 07/08/2020 Creatinine 1.46, BUN 20, Potassium 4.7, Sodium 142, GFR 44-50  A complete set of results can be found in Results Review.  Recommendations: Advised to take PRN Furosemide x 1 day. Reinforced limiting salt intake to < 2000 mg daily.  Follow-up plan: ICM clinic phone appointment on 11/15/2020 [facility] recheck fluid levels.   91 day device clinic remote transmission 01/17/2021.    EP/Cardiology Office Visits: 12/22/2020 with Dr. Diona Browner.  01/13/2021 with [name], PA.    Copy of ICM check sent to Dr. Johney Frame and Dr Diona Browner as Lorain Childes.   3 month ICM trend: 11/11/2020.    1 Year ICM trend:       [name], RN 11/12/2020 10:41 AM

## 2020-11-15 ENCOUNTER — Telehealth: Payer: Self-pay

## 2020-11-15 ENCOUNTER — Ambulatory Visit (INDEPENDENT_AMBULATORY_CARE_PROVIDER_SITE_OTHER): Payer: Medicare PPO

## 2020-11-15 DIAGNOSIS — I5042 Chronic combined systolic (congestive) and diastolic (congestive) heart failure: Secondary | ICD-10-CM

## 2020-11-15 DIAGNOSIS — Z95810 Presence of automatic (implantable) cardiac defibrillator: Secondary | ICD-10-CM

## 2020-11-15 NOTE — Telephone Encounter (Signed)
Spoke with patient.  Assisted in sending remote transmission.

## 2020-11-15 NOTE — Telephone Encounter (Signed)
Spoke with patient to assist with manual remote transmission.  Patient not at home and requested call back after 1:00 PM.

## 2020-11-15 NOTE — Progress Notes (Signed)
EPIC Encounter for ICM Monitoring  Patient Name: Allen [name] is a 87 y.o. male Date: 11/15/2020 Primary Care Physican: [name], MD Primary Cardiologist: Diona Browner Electrophysiologist: Allred Bi-V Pacing: 93%            Weight:  Unknown (not at home at time of call)  AT/AF Burden <1%        Heart Failure questions reviewed.  Pt asymptomatic for fluid. He reports weight is stable, no SOB or lower extremity swelling.  Pt reports he takes PRN Furosemide several times a week.   CorVue thoracic impedance suggesting fluid levels returned to normal after taking PRN furosemide.    Prescribed: Furosemide 20 mg Take 1 tablet by mouth daily as needed. Weight gain of 2-3 lbs in a 24 hour period or 5 lbs in one week.  Labs: 05/0/2022 Creatinine 1.61, BUN 16, Potassium 4.8, Sodium 144 07/08/2020 Creatinine 1.46, BUN 20, Potassium 4.7, Sodium 142, GFR 44-50  A complete set of results can be found in Results Review.  Recommendations: Advised to take PRN Furosemide x 1 day. Reinforced limiting salt intake to < 2000 mg daily.  Follow-up plan: ICM clinic phone appointment on 12/14/2020.   91 day device clinic remote transmission 01/17/2021.    EP/Cardiology Office Visits: 12/22/2020 with Dr. Diona Browner.  01/13/2021 with [name], PA.    Copy of ICM check sent to Dr. Johney Frame.   3 month ICM trend: 11/15/2020.    1 Year ICM trend:       [name], RN 11/15/2020 2:05 PM

## 2020-11-29 ENCOUNTER — Other Ambulatory Visit: Payer: Self-pay | Admitting: Cardiology

## 2020-11-29 ENCOUNTER — Other Ambulatory Visit: Payer: Self-pay | Admitting: *Deleted

## 2020-11-29 MED ORDER — ENTRESTO 49-51 MG PO TABS
1.0000 | ORAL_TABLET | Freq: Two times a day (BID) | ORAL | 1 refills | Status: DC
Start: 2020-11-29 — End: 2021-05-30

## 2020-12-05 DIAGNOSIS — J449 Chronic obstructive pulmonary disease, unspecified: Secondary | ICD-10-CM | POA: Diagnosis not present

## 2020-12-06 ENCOUNTER — Encounter: Payer: Self-pay | Admitting: Internal Medicine

## 2020-12-06 ENCOUNTER — Other Ambulatory Visit: Payer: Self-pay

## 2020-12-06 ENCOUNTER — Ambulatory Visit (INDEPENDENT_AMBULATORY_CARE_PROVIDER_SITE_OTHER): Payer: Medicare PPO | Admitting: Internal Medicine

## 2020-12-06 DIAGNOSIS — R0600 Dyspnea, unspecified: Secondary | ICD-10-CM | POA: Diagnosis not present

## 2020-12-06 DIAGNOSIS — J9611 Chronic respiratory failure with hypoxia: Secondary | ICD-10-CM

## 2020-12-06 DIAGNOSIS — R0609 Other forms of dyspnea: Secondary | ICD-10-CM

## 2020-12-06 NOTE — Assessment & Plan Note (Signed)
Onset March, 2015. : Sensation of shortness of breath at rest at  times and always when walking to the mailbox - much worse p covid  Dec 2020 and entresto d/c'd VQ low prob 06/23/19 (done for elevated d dimer)  - Echo 09/25/19 : Left ventricular ejection fraction, by estimation, is 35%. The left  ventricle has moderately decreased function. The left ventricle  demonstrates global hypokinesis. There is moderate left ventricular  hypertrophy. Left ventricular diastolic parameters  are consistent with Grade I diastolic dysfunction (impaired relaxation).  Elevated left atrial pressure.  2. The ventricular septum is flattened in diastole suggesting RV volume  overload. . Right ventricular systolic function was not well visualized.  The right ventricular size is not well visualized. There is moderately  elevated pulmonary artery systolic  pressure.  3. Left atrial size was mildly dilated.  4. Right atrial size was mildly dilated.  5. The mitral valve is abnormal. Mild mitral valve regurgitation. No  evidence of mitral stenosis.  6. Tricuspid valve regurgitation is mild to moderate.  7. The aortic valve is tricuspid. Aortic valve regurgitation is moderate.  Mild to moderate aortic valve stenosis.  8. There is mild to moderate pulmonary HTN, PASP is 37 mmHg.  9. The inferior vena cava is normal in size with greater than 50%  respiratory variability, suggesting right atrial pressure of 3 mmHg.  - entresto restarted 09/27/19  PFT's  02/03/2020  FEV1 1.92 (113 % ) ratio 0.91  p no % improvement from saba p nothing prior to study with DLCO  8.42 (45%) corrects to 2.56 (64%)  for alv volume and FV curve s concavity    No worse off symbicort so ok to leave it off / f/u can be prn

## 2020-12-06 NOTE — Progress Notes (Signed)
Allen [name], male    DOB: 10-04-1933, 87 y.o.   MRN: [id number]   Brief patient profile:  86 yobm quit smoking 2013 without apparent sequelae then onset covid 19 xmas 2020 and admitted Jan 2021 and breathing hasn't been right since so referred to pulmonary clinic in [name]  09/29/2019 by Dr   Diona Browner      History of Present Illness  09/29/2019  Pulmonary/ 1st [name] eval/[facility] [name]  Chief Complaint  Patient presents with   Pulmonary Consult    Referred by Dr [name]. Pt c/o SOB since had covid Dec 2020.   Dyspnea:  Breathing about the same as at d/c from [place] [name] can walk flat for 3-4 min does not check while walking Cough: not much , thick mucus from nose  Sleep: bed is flat / 2 pillows  SABA use:  02  3 concentrator and 3lpm on portable system does not titrate rec Will send order to [place] [name] to be sure your oxygen is humdified Please remember to go to the lab department   for your tests - we will call you with the results when they are available - ok to wait 10 days as per Dr McDowell's instructions  Make sure you check your oxygen saturations at highest level of activity to be sure it stays over 90% and adjust upward to maintain this level if needed but remember to turn it back to previous settings when you stop (to conserve your supply).    10/31/2019  f/u ov/[facility] [name] re: doe / 02 dep since covid, got 1st vaccine ? Loss adjuster, chartered Complaint  Patient presents with   Follow-up    Breathing has improved since the last visit. No cough or wheezing.   Dyspnea:  Walk in from [name] parking on 3lpm portable tank , can do one aisle at grocery store s variability  Cough: none Sleeping: bed is flat 2 pillows  SABA use: no inhalers 02:   3lpm 24/7  rec Make sure you check your oxygen saturations at highest level of activity to be sure it stays over 90%   [name] visit in 6 weeks, call sooner if needed with all medications    12/11/2019  f/u ov/[place] [name] re: sob x 2015 / 02  only since covid with   2nd shot May 25 21 - now on symbicort ? strength Chief Complaint  Patient presents with   Follow-up    Breathing is overall doing well today.    Dyspnea:  Maybe 2 aisles at [employer] / walking at park some but not monitoring sats as rec  Cough: none  Sleeping: flat bed 2 pillows  SABA use: none now on symbicort 160 2bid 02: 3lpm conc and portable  = 2 lpm does not titrate  Rec Work on inhaler technique:    Stay on symbicort for now  Take 2 puffs first thing in am and then another 2 puffs about 12 hours later.  Make sure you check your oxygen saturations at highest level of activity to be sure it stays over 90%     02/06/2020  f/u ov/Allen [name]/[facility] [name] re:  Chief Complaint  Patient presents with   Follow-up    Patient feels good overall but has been getting dizzy when he stands up or turns head to fast. PFT 8/24  Dyspnea:  Walking 12 min improving tol  despite not using 02 with ex as rec  Cough: none / some am congestion nothing purulent produced Sleeping: flat one pillow  ok   SABA use: none  02: 3lpm hs and prn but not understanding prn concept(tirate to sat > 90%)  Rec Change symbicort  Use up to every 12 hours to see if it helps. Make sure you check your oxygen saturations at highest level of activity to be sure it stays over 90%       12/06/2020  f/u ov/Allen [name]/Allen [name] re: post covid 06/2019 / resp failure/ no copd  Chief Complaint  Patient presents with   Follow-up    Patient feels like breathing is a little worse since last visit. Wears 2-3 liters at all times. Denies cough  Dyspnea:  worse and flat to mb if not using 02, does grocery ok putting 02 in cart Cough: certain smells / dry Sleeping: on 3lpm bed is flat with pillows  to get maybe 30 degrees  SABA use: none  02: 3lpm at has 3lpm POC  Covid status: x  3  Lung cancer screening: n/a    No obvious day to day or daytime variability or assoc excess/ purulent sputum or mucus plugs  or hemoptysis or cp or chest tightness, subjective wheeze or overt sinus or hb symptoms.   Sleeping  without nocturnal  or early am exacerbation  of respiratory  c/o's or need for noct saba. Also denies any obvious fluctuation of symptoms with weather or environmental changes or other aggravating or alleviating factors except as outlined above   No unusual exposure hx or h/o childhood pna/ asthma or knowledge of premature birth.  Current Allergies, Complete Past Medical History, Past Surgical History, Family History, and Social History were reviewed in [employer] record.  ROS  The following are not active complaints unless bolded Hoarseness, sore throat, dysphagia, dental problems, itching, sneezing,  nasal congestion or discharge of excess mucus or purulent secretions, ear ache,   fever, chills, sweats, unintended wt loss or wt gain, classically pleuritic or exertional cp,  orthopnea pnd or arm/hand swelling  or leg swelling, presyncope, palpitations, abdominal pain, anorexia, nausea, vomiting, diarrhea  or change in bowel habits or change in bladder habits, change in stools or change in urine, dysuria, hematuria,  rash, arthralgias, visual complaints, headache, numbness, weakness or ataxia or problems with walking or coordination,  change in mood or  memory.        Current Meds  Medication Sig   amLODipine (NORVASC) 5 MG tablet Take 2 tablets by mouth daily.   aspirin 81 MG EC tablet Take 81 mg by mouth daily.   atorvastatin (LIPITOR) 80 MG tablet Take 1 tablet (80 mg total) by mouth daily at 6 PM.   budesonide-formoterol (SYMBICORT) 160-4.5 MCG/ACT inhaler Inhale 2 puffs into the lungs 2 (two) times daily.   carvedilol (COREG) 6.25 MG tablet Take 1 tablet (6.25 mg total) by mouth 2 (two) times daily with a meal.   cholecalciferol (VITAMIN D3) 25 MCG (1000 UNIT) tablet Take 1,000 Units by mouth daily.   clopidogrel (PLAVIX) 75 MG tablet TAKE 1 TABLET EVERY DAY WITH  BREAKFAST   furosemide (LASIX) 20 MG tablet Take 20 mg by mouth daily as needed. Weight gain of 2-3 lbs in a 24 hour period or 5 lbs in one week   Insulin Glargine (LANTUS SOLOSTAR) 100 UNIT/ML Solostar Pen Inject 20 Units into the skin at bedtime.   Insulin Lispro Junior KwikPen 100 UNIT/ML SOPN    mirtazapine (REMERON) 15 MG tablet Take 15 mg by mouth daily at 12 noon.    nitroGLYCERIN (  NITROSTAT) 0.4 MG SL tablet Place 0.4 mg under the tongue every 5 (five) minutes as needed for chest pain (If no relief after 3rd dose, proceed to the ED).   OXYGEN 3 lpm 24/7 DME- [name]   repaglinide (PRANDIN) 2 MG tablet Take 2 mg by mouth 2 (two) times daily before a meal.    sacubitril-valsartan (ENTRESTO) 49-51 MG Take 1 tablet by mouth 2 (two) times daily.   vitamin B-12 (CYANOCOBALAMIN) 1000 MCG tablet              Past Medical History:  Diagnosis Date   AICD (automatic cardioverter/defibrillator) present 07/17/2018   BIV   CAD (coronary artery disease)    a. 04/2016: s/p CABG with LIMA-LAD, SVG-D1, SVG-RCA, and seq SVG-OM1-OM2   Essential hypertension    Hyperlipidemia    Ischemic cardiomyopathy    LVEF 35% May 2016   LBBB (left bundle branch block)    Pneumonia due to COVID-19 virus    Sinus bradycardia    Type 2 diabetes mellitus (HCC)        Objective:    12/06/2020         179 02/06/2020        173   12/11/2019         176   10/31/19 173 lb (78.5 kg)  09/29/19 171 lb (77.6 kg)  09/02/19 170 lb (77.1 kg)     Vital signs reviewed  12/06/2020  - Note at rest 02 sats  92% on RA   General appearance:    chronically ill amb bm     HEENT : pt wearing mask not removed for exam due to covid -19 concerns.    NECK :  without JVD/Nodes/TM/ nl carotid upstrokes bilaterally   LUNGS: no acc muscle use,  Nl contour chest with a few insp crackles bilaterally without cough on insp or exp maneuvers   CV:  RRR  no s3   2-3/6 sem s increase in P2, and no edema   ABD:  soft and  nontender with nl inspiratory excursion in the supine position. No bruits or organomegaly appreciated, bowel sounds nl  MS:  Nl gait/ ext warm without deformities, calf tenderness, cyanosis or clubbing No obvious joint restrictions   SKIN: warm and dry without lesions    NEURO:  alert, approp, nl sensorium with  no motor or cerebellar deficits apparent.                 Assessment

## 2020-12-06 NOTE — Assessment & Plan Note (Signed)
Onset at Covid 19 pna 06/2019  - 09/29/2019  sat on RA at rest was 80%-- pt placed on o2 3lpm and sats increased to 93%--walked up hallway x 1 and sats decreased to 84%--o2 increased to 4 lpm-- sats increased to 90%-- walked another lap and sats decreased to 88%--increased to 5lpm and walked another lap with sats ending at 90%//lmr -  10/31/2019   Walked RA  approx   300 ft  @ avg pace/unsteady pace  stopped due to  Sob and sats down to 88% on 5lpm continuous   -  12/11/2019   Walked after desat to 87% 2lpm pt was placed on 3lpm cont o2 and sats increased to 95% and remained 95% for 100 ft  - 02/06/2020 sats ok at rest but after 100 ft dropped to 85% and on 3lpm able to do 300 ft s desat or sob at slow pace -  12/06/2020   Walked RA  approx   200 ft  @ slow pace  stopped due to  desat to 88% with sob, rx  3lpm POC   And walked another 100 ft  With less sob  Clearly when using 02 correctly he benefits and should use with exertion beyond indoor walking to achieve better ex tolerance with less sob/ advised  Each maintenance medication was reviewed in detail including emphasizing most importantly the difference between maintenance and prns and under what circumstances the prns are to be triggered using an action plan format where appropriate.  Total time for H and P, chart review, counseling,  directly observing portions of ambulatory 02 saturation study/ and generating customized AVS unique to this final summary office visit / same day charting = 35 min

## 2020-12-06 NOTE — Patient Instructions (Signed)
Make sure you check your oxygen saturation  at your highest level of activity  to be sure it stays over 90% and adjust  02 flow upward to maintain this level if needed but remember to turn it back to previous settings when you stop (to conserve your supply).    Pulmonary follow up as needed

## 2020-12-12 DIAGNOSIS — J449 Chronic obstructive pulmonary disease, unspecified: Secondary | ICD-10-CM | POA: Diagnosis not present

## 2020-12-20 ENCOUNTER — Ambulatory Visit (INDEPENDENT_AMBULATORY_CARE_PROVIDER_SITE_OTHER): Payer: Medicare PPO

## 2020-12-20 ENCOUNTER — Telehealth: Payer: Self-pay

## 2020-12-20 DIAGNOSIS — I5042 Chronic combined systolic (congestive) and diastolic (congestive) heart failure: Secondary | ICD-10-CM | POA: Diagnosis not present

## 2020-12-20 DIAGNOSIS — Z95810 Presence of automatic (implantable) cardiac defibrillator: Secondary | ICD-10-CM

## 2020-12-20 NOTE — Progress Notes (Signed)
EPIC Encounter for ICM Monitoring  Patient Name: Allen [name] is a 87 y.o. male Date: 12/20/2020 Primary Care Physican: [name], MD Primary Cardiologist: Diona Browner Electrophysiologist: Allred Bi-V Pacing: 92%            Weight:  Unknown (not at home at time of call)   AT/AF Burden <1%         Spoke with patient and heart failure questions reviewed.  Pt asymptomatic for fluid accumulation and feeing well. Pt reports he takes PRN Furosemide almost every day.  He does eat restaurants foods several times a week.   CorVue thoracic impedance suggesting normal fluid levels  but was suggesting possible fluid accumulation from 6/7-6/16 and 6/277/8.   Prescribed: Furosemide 20 mg Take 1 tablet by mouth daily as needed. Weight gain of 2-3 lbs in a 24 hour period or 5 lbs in one week.   Labs: 10/13/2020 Creatinine 1.61, BUN 16, Potassium 4.8, Sodium 144 07/08/2020 Creatinine 1.46, BUN 20, Potassium 4.7, Sodium 142, GFR 44-50  A complete set of results can be found in Results Review.   Recommendations: Recommendation to limit salt intake to 2000 mg daily and fluid intake to 64 oz daily.  Encouraged to call if experiencing any fluid symptoms.    Follow-up plan: ICM clinic phone appointment on 01/24/2021.   91 day device clinic remote transmission 01/17/2021.     EP/Cardiology Office Visits: 12/22/2020 with Dr. Diona Browner.  01/13/2021 with [name], PA.     Copy of ICM check sent to Dr. Johney Frame.    3 month ICM trend: 12/20/2020.    1 Year ICM trend:       [name], RN 12/20/2020 10:37 AM

## 2020-12-20 NOTE — Telephone Encounter (Signed)
Remote ICM transmission received.  Attempted call to patient regarding ICM remote transmission and he was at an office visit appointment.  Advised will call him back tomorrow.

## 2020-12-22 ENCOUNTER — Ambulatory Visit (INDEPENDENT_AMBULATORY_CARE_PROVIDER_SITE_OTHER): Payer: Medicare PPO | Admitting: Cardiology

## 2020-12-22 ENCOUNTER — Encounter: Payer: Self-pay | Admitting: Cardiology

## 2020-12-22 VITALS — BP 140/72 | HR 60 | Ht 62.0 in | Wt 177.0 lb

## 2020-12-22 DIAGNOSIS — I255 Ischemic cardiomyopathy: Secondary | ICD-10-CM

## 2020-12-22 DIAGNOSIS — I350 Nonrheumatic aortic (valve) stenosis: Secondary | ICD-10-CM | POA: Diagnosis not present

## 2020-12-22 DIAGNOSIS — Z79899 Other long term (current) drug therapy: Secondary | ICD-10-CM

## 2020-12-22 DIAGNOSIS — I5022 Chronic systolic (congestive) heart failure: Secondary | ICD-10-CM | POA: Diagnosis not present

## 2020-12-22 MED ORDER — EMPAGLIFLOZIN 10 MG PO TABS: 10.0000 mg | ORAL_TABLET | Freq: Every day | ORAL | 6 refills | Status: DC

## 2020-12-22 NOTE — Patient Instructions (Signed)
Medication Instructions:  Your physician has recommended you make the following change in your medication:  Start jardiance 10 mg by mouth daily Continue other medications the same  Labwork: BMET in 2 weeks at [facility]  Testing/Procedures: none  Follow-Up: Your physician recommends that you schedule a follow-up appointment in: 6 months  Any Other Special Instructions Will Be Listed Below (If Applicable).  If you need a refill on your cardiac medications before your next appointment, please call your pharmacy.

## 2020-12-22 NOTE — Progress Notes (Signed)
Cardiology Office Note  Date: 12/22/2020   ID: Allen [name], DOB [birth year], MRN [id number]  PCP:  [name], MD  Cardiologist:  [name], MD Electrophysiologist:  Hillis Range, MD   Chief Complaint  Patient presents with   Cardiac follow-up     History of Present Illness: Allen [name] is an 87 y.o. male last seen in January.  He is here with family member for a follow-up visit.  Reports no overall change in status, NYHA class II-III dyspnea depending on level of activity.  He remains on supplemental oxygen and continues to follow with Pulmonary.  He has a [employer] Jude biventricular ICD in place followed by Dr. Johney Frame.  Device interrogation just recently showed thoracic impedance consistent with normal fluid status although with prior excess in June.  He has had no device shocks or syncope.  I reviewed his medications and last set of lab work.  I did talk with him about SGLT2 inhibitor and we will try Jardiance 10 mg daily.  May need less Lasix in that case, but he is on a very low-dose at present.  He does have CKD stage III.  Past Medical History:  Diagnosis Date   Biventricular ICD (implantable cardioverter-defibrillator) in place 07/17/2018   CAD (coronary artery disease)    a. 04/2016: s/p CABG with LIMA-LAD, SVG-D1, SVG-RCA, and seq SVG-OM1-OM2   Essential hypertension    Hyperlipidemia    Ischemic cardiomyopathy    LVEF 35% May 2016   LBBB (left bundle branch block)    Pneumonia due to COVID-19 virus    Sinus bradycardia    Type 2 diabetes mellitus (HCC)     Past Surgical History:  Procedure Laterality Date   BIV ICD INSERTION CRT-D  07/17/2018   BIV ICD INSERTION CRT-D N/A 07/17/2018   Procedure: BIV ICD INSERTION CRT-D;  Surgeon: Hillis Range, MD;  Location: [facility] INVASIVE CV LAB;  Service: Cardiovascular;  Laterality: N/A;   CARDIAC CATHETERIZATION N/A 11/02/2014   Procedure: Left Heart Cath and Coronary Angiography;  Surgeon: Lyn Records, MD;   Location: [facility] INVASIVE CV LAB;  Service: Cardiovascular;  Laterality: N/A;   CARDIAC CATHETERIZATION N/A 05/03/2016   Procedure: Left Heart Cath and Coronary Angiography;  Surgeon: Peter M Swaziland, MD;  Location: [facility] INVASIVE CV LAB;  Service: Cardiovascular;  Laterality: N/A;   CORONARY ARTERY BYPASS GRAFT N/A 05/08/2016   Procedure: CORONARY ARTERY BYPASS GRAFTING (CABG) x 5;  Surgeon: Alleen Borne, MD;  Location: [facility] OR;  Service: Open Heart Surgery;  Laterality: N/A;   ENDOVEIN HARVEST OF GREATER SAPHENOUS VEIN  05/08/2016   Procedure: ENDOVEIN HARVEST OF GREATER SAPHENOUS VEIN;  Surgeon: Alleen Borne, MD;  Location: [facility] OR;  Service: Open Heart Surgery;;   TEE WITHOUT CARDIOVERSION N/A 05/08/2016   Procedure: TRANSESOPHAGEAL ECHOCARDIOGRAM (TEE);  Surgeon: Alleen Borne, MD;  Location: [facility] OR;  Service: Open Heart Surgery;  Laterality: N/A;    Current Outpatient Medications  Medication Sig Dispense Refill   amLODipine (NORVASC) 5 MG tablet Take 2 tablets by mouth daily.     aspirin 81 MG EC tablet Take 81 mg by mouth daily.     atorvastatin (LIPITOR) 80 MG tablet Take 1 tablet (80 mg total) by mouth daily at 6 PM. 90 tablet 3   carvedilol (COREG) 6.25 MG tablet Take 1 tablet (6.25 mg total) by mouth 2 (two) times daily with a meal. 180 tablet 1   clopidogrel (PLAVIX) 75 MG tablet TAKE  1 TABLET EVERY DAY WITH BREAKFAST 90 tablet 1   empagliflozin (JARDIANCE) 10 MG TABS tablet Take 1 tablet (10 mg total) by mouth daily before breakfast. 30 tablet 6   furosemide (LASIX) 20 MG tablet Take 20 mg by mouth daily as needed. Weight gain of 2-3 lbs in a 24 hour period or 5 lbs in one week     Insulin Glargine (LANTUS SOLOSTAR) 100 UNIT/ML Solostar Pen Inject 20 Units into the skin at bedtime. 15 mL 11   Insulin Lispro Junior KwikPen 100 UNIT/ML SOPN      mirtazapine (REMERON) 15 MG tablet Take 15 mg by mouth daily at 12 noon.      nitroGLYCERIN (NITROSTAT) 0.4 MG SL tablet Place 0.4 mg under the  tongue every 5 (five) minutes as needed for chest pain (If no relief after 3rd dose, proceed to the ED).     OXYGEN 3 lpm 24/7 DME- [employer]     repaglinide (PRANDIN) 2 MG tablet Take 2 mg by mouth 2 (two) times daily before a meal.      sacubitril-valsartan (ENTRESTO) 49-51 MG Take 1 tablet by mouth 2 (two) times daily. 180 tablet 1   vitamin B-12 (CYANOCOBALAMIN) 1000 MCG tablet      No current facility-administered medications for this visit.   Allergies:  Patient has no known allergies.   ROS: No syncope.  Physical Exam: VS:  BP 140/72   Pulse 60   Ht 5\' 2"  (1.575 m)   Wt 177 lb (80.3 kg)   SpO2 95%   BMI 32.37 kg/m , BMI Body mass index is 32.37 kg/m.  Wt Readings from Last 3 Encounters:  12/22/20 177 lb (80.3 kg)  12/06/20 179 lb 6.4 oz (81.4 kg)  10/13/20 175 lb 6.4 oz (79.6 kg)    General: Elderly male, appears comfortable at rest.  Wearing oxygen via nasal cannula. HEENT: Conjunctiva and lids normal, oropharynx clear with moist mucosa. Neck: Supple, no elevated JVP or carotid bruits, no thyromegaly. Lungs: Clear to auscultation, nonlabored breathing at rest. Cardiac: Regular rate and rhythm, no S3, 2/6 systolic murmur, no pericardial rub. Extremities: No pitting edema.  ECG:  An ECG dated 10/13/2020 was personally reviewed today and demonstrated:  Biventricular pacing with PVCs.  Recent Labwork: 07/08/2020: Magnesium 2.1 10/13/2020: BUN 16; Creatinine, Ser 1.61; NT-Pro BNP 935; Potassium 4.8; Sodium 144   Other Studies Reviewed Today:  Echocardiogram 07/20/2020:  1. Left ventricular ejection fraction, by estimation, is approximately  35%. The left ventricle has moderately decreased function. The left  ventricle demonstrates global hypokinesis. The left ventricular internal  cavity size was mildly dilated. There is  moderate left ventricular hypertrophy. Left ventricular diastolic  parameters are indeterminate. Abnormal global longitudinal strain of  -9.3%.   2.  Right ventricular systolic function is mildly reduced. The right  ventricular size is normal. A device wire is visualized. There is normal  pulmonary artery systolic pressure. The estimated right ventricular  systolic pressure is 35.3 mmHg.   3. Left atrial size was mild to moderately dilated.   4. The mitral valve is grossly normal. Mild mitral valve regurgitation.   5. The aortic valve is tricuspid. There is moderate calcification of the  aortic valve. Aortic valve regurgitation is mild to moderate. Mild to  moderate aortic valve stenosis. Aortic valve mean gradient measures 11.3  mmHg. Dimentionless index 0.41.   6. Aortic dilatation noted. There is mild dilatation of the aortic root,  measuring 40 mm.   7. The  inferior vena cava is normal in size with greater than 50%  respiratory variability, suggesting right atrial pressure of 3 mmHg.   Assessment and Plan:  1.  Chronic systolic heart failure with ischemic cardiomyopathy and LVEF 35%, also mild RV dysfunction.  He has a biventricular ICD in place.  Recent thoracic impedance shows normalization of fluid status and his weight is stable today.  Continue Coreg, Entresto, and Lasix.  Start Jardiance 10 mg daily with follow-up BMET in 2 weeks.  May need to cut back Lasix from there.  2.  CAD status post CABG.  No angina symptoms at this time.  Continue aspirin and statin.  3.  Aortic stenosis, mild to moderate by echocardiogram in February.  4.  [employer]. Jude biventricular ICD with follow-up with Dr. Johney Frame.  No device shocks or syncope.  Medication Adjustments/Labs and Tests Ordered: Current medicines are reviewed at length with the patient today.  Concerns regarding medicines are outlined above.   Tests Ordered: Orders Placed This Encounter  Procedures   Basic metabolic panel     Medication Changes: Meds ordered this encounter  Medications   empagliflozin (JARDIANCE) 10 MG TABS tablet    Sig: Take 1 tablet (10 mg total) by mouth  daily before breakfast.    Dispense:  30 tablet    Refill:  6    12/22/2020 NEW     Disposition:  Follow up  6 months.  Signed, Jonelle Sidle, MD, [facility] 12/22/2020 2:26 PM     [facility] [address] [place], [place], [place] [address] Phone: [phone]; Fax: [phone]

## 2020-12-29 ENCOUNTER — Other Ambulatory Visit: Payer: Self-pay

## 2020-12-29 ENCOUNTER — Other Ambulatory Visit (HOSPITAL_COMMUNITY)
Admission: RE | Admit: 2020-12-29 | Discharge: 2020-12-29 | Disposition: A | Discharge status: Home / Self Care | Payer: Medicare PPO | Source: Ambulatory Visit | Attending: Cardiology | Admitting: Cardiology

## 2020-12-29 DIAGNOSIS — I350 Nonrheumatic aortic (valve) stenosis: Secondary | ICD-10-CM | POA: Diagnosis not present

## 2020-12-29 DIAGNOSIS — Z79899 Other long term (current) drug therapy: Secondary | ICD-10-CM | POA: Diagnosis not present

## 2020-12-29 DIAGNOSIS — I255 Ischemic cardiomyopathy: Secondary | ICD-10-CM | POA: Insufficient documentation

## 2020-12-29 LAB — BASIC METABOLIC PANEL
Anion gap: 9 (ref 5–15)
BUN: 24 mg/dL — ABNORMAL HIGH (ref 8–23)
CO2: 25 mmol/L (ref 22–32)
Calcium: 10.4 mg/dL — ABNORMAL HIGH (ref 8.9–10.3)
Chloride: 108 mmol/L (ref 98–111)
Creatinine, Ser: 2.02 mg/dL — ABNORMAL HIGH (ref 0.61–1.24)
GFR, Estimated: 32 mL/min — ABNORMAL LOW (ref >60)
Glucose, Bld: 168 mg/dL — ABNORMAL HIGH (ref 70–99)
Potassium: 4.7 mmol/L (ref 3.5–5.1)
Sodium: 142 mmol/L (ref 135–145)

## 2020-12-30 ENCOUNTER — Telehealth: Payer: Self-pay | Admitting: *Deleted

## 2020-12-30 DIAGNOSIS — Z79899 Other long term (current) drug therapy: Secondary | ICD-10-CM

## 2020-12-30 DIAGNOSIS — I5022 Chronic systolic (congestive) heart failure: Secondary | ICD-10-CM

## 2020-12-30 NOTE — Telephone Encounter (Signed)
-----   Message from Jonelle Sidle, MD sent at 12/29/2020  3:09 PM EDT ----- Results reviewed.  Creatinine has bumped up from 1.61-2.02 since starting Jardiance.  Would recommend not using any standing Lasix, only for weight gain of 2 to 3 pounds in 24 hours.  We should recheck a BMET in the next 2 to 3 weeks.

## 2020-12-30 NOTE — Addendum Note (Signed)
Addended by: Eustace Moore on: 12/30/2020 04:32 PM   Modules accepted: Orders

## 2020-12-30 NOTE — Telephone Encounter (Signed)
Patient informed and verbalized understanding of plan. Lab order faxed to [facility] Lab Copy sent to PCP 

## 2021-01-04 DIAGNOSIS — J449 Chronic obstructive pulmonary disease, unspecified: Secondary | ICD-10-CM | POA: Diagnosis not present

## 2021-01-09 DIAGNOSIS — E1121 Type 2 diabetes mellitus with diabetic nephropathy: Secondary | ICD-10-CM | POA: Diagnosis not present

## 2021-01-09 DIAGNOSIS — I5022 Chronic systolic (congestive) heart failure: Secondary | ICD-10-CM | POA: Diagnosis not present

## 2021-01-09 DIAGNOSIS — J449 Chronic obstructive pulmonary disease, unspecified: Secondary | ICD-10-CM | POA: Diagnosis not present

## 2021-01-09 DIAGNOSIS — N1831 Chronic kidney disease, stage 3a: Secondary | ICD-10-CM | POA: Diagnosis not present

## 2021-01-12 DIAGNOSIS — J449 Chronic obstructive pulmonary disease, unspecified: Secondary | ICD-10-CM | POA: Diagnosis not present

## 2021-01-12 NOTE — Progress Notes (Signed)
Electrophysiology Office Note Date: 01/13/2021  ID:  Allen [name], DOB 1933-09-17, MRN [id number]  PCP: Allen Deiters, MD Primary Cardiologist: Allen Dell, MD Electrophysiologist: Allen Range, MD   CC: Routine ICD follow-up  Allen [name] is a 87 y.o. male seen today for Allen Range, MD for routine electrophysiology followup.  Since last being seen in our clinic the patient reports doing about the same. SOB with mild to moderate exertion. OK with ADLs. SOB walking around stores or from car to building. He uses the motorized cart at the grocery about half the time. Has dizziness with rapid position changes including rolling over in bed. Uses O2 chronically. Denies chest pain or syncope. No ICD shocks.   Device History: St. Jude BiV ICD implanted 07/17/2018 for ICM, NYHA III, SSS, LBBB History of appropriate therapy: No History of AAD therapy: No   Past Medical History:  Diagnosis Date   Biventricular ICD (implantable cardioverter-defibrillator) in place 07/17/2018   CAD (coronary artery disease)    a. 04/2016: s/p CABG with LIMA-LAD, SVG-D1, SVG-RCA, and seq SVG-OM1-OM2   Essential hypertension    Hyperlipidemia    Ischemic cardiomyopathy    LVEF 35% May 2016   LBBB (left bundle branch block)    Pneumonia due to COVID-19 virus    Sinus bradycardia    Type 2 diabetes mellitus (HCC)    Past Surgical History:  Procedure Laterality Date   BIV ICD INSERTION CRT-D  07/17/2018   BIV ICD INSERTION CRT-D N/A 07/17/2018   Procedure: BIV ICD INSERTION CRT-D;  Surgeon: Allen Range, MD;  Location: [facility] INVASIVE CV LAB;  Service: Cardiovascular;  Laterality: N/A;   CARDIAC CATHETERIZATION N/A 11/02/2014   Procedure: Left Heart Cath and Coronary Angiography;  Surgeon: Lyn Records, MD;  Location: [facility] INVASIVE CV LAB;  Service: Cardiovascular;  Laterality: N/A;   CARDIAC CATHETERIZATION N/A 05/03/2016   Procedure: Left Heart Cath and Coronary Angiography;  Surgeon: Peter M Swaziland,  MD;  Location: [facility] INVASIVE CV LAB;  Service: Cardiovascular;  Laterality: N/A;   CORONARY ARTERY BYPASS GRAFT N/A 05/08/2016   Procedure: CORONARY ARTERY BYPASS GRAFTING (CABG) x 5;  Surgeon: Alleen Borne, MD;  Location: [facility] OR;  Service: Open Heart Surgery;  Laterality: N/A;   ENDOVEIN HARVEST OF GREATER SAPHENOUS VEIN  05/08/2016   Procedure: ENDOVEIN HARVEST OF GREATER SAPHENOUS VEIN;  Surgeon: Alleen Borne, MD;  Location: [facility] OR;  Service: Open Heart Surgery;;   TEE WITHOUT CARDIOVERSION N/A 05/08/2016   Procedure: TRANSESOPHAGEAL ECHOCARDIOGRAM (TEE);  Surgeon: Alleen Borne, MD;  Location: [facility] OR;  Service: Open Heart Surgery;  Laterality: N/A;    Current Outpatient Medications  Medication Sig Dispense Refill   amLODipine (NORVASC) 5 MG tablet Take 2 tablets by mouth daily.     aspirin 81 MG EC tablet Take 81 mg by mouth daily.     atorvastatin (LIPITOR) 80 MG tablet Take 1 tablet (80 mg total) by mouth daily at 6 PM. 90 tablet 3   clopidogrel (PLAVIX) 75 MG tablet TAKE 1 TABLET EVERY DAY WITH BREAKFAST 90 tablet 1   furosemide (LASIX) 20 MG tablet Take 20 mg by mouth daily as needed. Weight gain of 2-3 lbs in a 24 hour period or 5 lbs in one week     Insulin Glargine (LANTUS SOLOSTAR) 100 UNIT/ML Solostar Pen Inject 20 Units into the skin at bedtime. 15 mL 11   Insulin Lispro Junior KwikPen 100 UNIT/ML SOPN  nitroGLYCERIN (NITROSTAT) 0.4 MG SL tablet Place 0.4 mg under the tongue every 5 (five) minutes as needed for chest pain (If no relief after 3rd dose, proceed to the ED).     OXYGEN 3 lpm 24/7 DME- [employer]     carvedilol (COREG) 6.25 MG tablet Take 1 tablet (6.25 mg total) by mouth 2 (two) times daily with a meal. (Patient not taking: Reported on 01/13/2021) 180 tablet 1   empagliflozin (JARDIANCE) 10 MG TABS tablet Take 1 tablet (10 mg total) by mouth daily before breakfast. (Patient not taking: Reported on 01/13/2021) 30 tablet 6   mirtazapine (REMERON) 15 MG tablet Take 15 mg  by mouth daily at 12 noon.  (Patient not taking: Reported on 01/13/2021)     repaglinide (PRANDIN) 2 MG tablet Take 2 mg by mouth 2 (two) times daily before a meal.  (Patient not taking: Reported on 01/13/2021)     sacubitril-valsartan (ENTRESTO) 49-51 MG Take 1 tablet by mouth 2 (two) times daily. (Patient not taking: Reported on 01/13/2021) 180 tablet 1   vitamin B-12 (CYANOCOBALAMIN) 1000 MCG tablet  (Patient not taking: Reported on 01/13/2021)     No current facility-administered medications for this visit.    Allergies:   Patient has no known allergies.   Social History: Social History   Socioeconomic History   Marital status: Married    Spouse name: Not on file   Number of children: Not on file   Years of education: Not on file   Highest education level: Not on file  Occupational History   Occupation: Allen [name]    Comment: Retired  Tobacco Use   Smoking status: Former    Packs/day: 0.50    Years: 56.00    Pack years: 28.00    Types: Cigarettes    Start date: 02/18/1956    Quit date: 06/13/2011    Years since quitting: 9.5   Smokeless tobacco: Never  Vaping Use   Vaping Use: Never used  Substance and Sexual Activity   Alcohol use: No    Alcohol/week: 0.0 standard drinks   Drug use: No   Sexual activity: Never  Other Topics Concern   Not on file  Social History Narrative   Retired Naval architect, 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   Physical  Exam: Vitals:   01/13/21 1013  BP: 102/60  Pulse: 62  SpO2: 98%  Weight: 175 lb 12.8 oz (79.7 kg)  Height: 5\' 2"  (1.575 m)     GEN- The patient is well appearing, alert and oriented x 3 today.   HEENT: normocephalic, atraumatic; sclera clear, conjunctiva pink; hearing intact; oropharynx clear; neck supple, no JVP Lymph- no cervical lymphadenopathy Lungs- Clear to ausculation bilaterally, normal work of breathing.  No wheezes, rales, rhonchi Heart- Regular rate and rhythm, no murmurs, rubs or gallops, PMI not laterally displaced GI- soft, non-tender, non-distended, bowel  sounds present, no hepatosplenomegaly Extremities- no clubbing or cyanosis. No edema; DP/PT/radial pulses 2+ bilaterally MS- no significant deformity or atrophy Skin- warm and dry, no rash or lesion; ICD pocket well healed Psych- euthymic mood, full affect Neuro- strength and sensation are intact  ICD interrogation- reviewed in detail today,  See PACEART report  EKG:  EKG is not ordered today.  Recent Labs: 07/08/2020: Magnesium 2.1 10/13/2020: NT-Pro BNP 935 12/29/2020: BUN 24; Creatinine, Ser 2.02; Potassium 4.7; Sodium 142   Wt Readings from Last 3 Encounters:  01/13/21 175 lb 12.8 oz (79.7 kg)  12/22/20 177 lb (80.3 kg)  12/06/20 179 lb 6.4 oz (81.4 kg)     Other studies Reviewed: Additional studies/ records that were reviewed today include: Previous EP office notes.   Assessment and Plan:  1.  Chronic systolic dysfunction s/p St. Jude CRT-D  euvolemic today Stable on an appropriate medical regimen Normal ICD function See Pace Art report No changes today  1.  Chronic systolic dysfunction s/p St. Jude CRT-D  euvolemic today Stable on an appropriate medical regimen Normal ICD function See Pace Art report No changes today S/p AV optimization 10/27/2019. He felt somewhat better and has maintained this. V-V optimization 02/2020. QRS 176 > 122 ms after reprogramming with positive/RBBB V1 and biphasic  lead 1. Previous optimization imaging was reviewed and felt appropriate to increase PAV delay at this time, with intrinsic A to R of 300 ms.   2. Sinus bradycardia Stable s/p device implant   3. HTN Stable   4. Vertiginous dizziness Encouraged PCP follow up.   Allen [name]'s heart failure has failed to improve despite titration of guideline directed medication such that he qualifies for the [facility] NEO device. The following information from the patient's medical record supports the medical necessity of this procedure for my patient, consistent with the FDA on-label indication for BAROSTIM NEO:  ? LVEF of ~35% confirmed by Echo on 07/20/20   ? NT-proBNP of <1600 pg/ml  = Pt had a NT-proBNP of 935 on 10/13/2020  ?Symptomatic despite medication management of: diuretic, beta blocker, ACEi/ARB/ARNi, Aldosterone inhibitor, and SGLT2 inhibitor as evidenced by symptoms below.   ? This patients signs and symptoms of heart failure include "dyspnea with mild to moderate exertion, fatigue, weakness, and dizziness   ? NYHA Congestive Heart Failure Classification: III  ? Recent hospitalization for Heart Failure on (not applicable for this patient)   This patient continues to have NYHA III symptoms despite cardiac resynchronization therapy with multiple attempts at optimization as above.   Pt would be a good candidate for BAT, but not interested in another device at this time. Informational given and encouraged to take home and review with his family.   Current medicines are reviewed at length with the patient today.   The patient does not have concerns regarding his medicines.  The following changes were made today:  none  Labs/ tests ordered today include:  Orders Placed This Encounter  Procedures   Basic metabolic panel   CBC    Disposition:   Follow up with EP APP  6 months   Signed, Graciella Freer, PA-C  01/13/2021 11:04 AM  [facility] [address] [place] [address] [phone] (office) [phone] (fax)

## 2021-01-13 ENCOUNTER — Encounter: Payer: Self-pay | Admitting: Student

## 2021-01-13 ENCOUNTER — Ambulatory Visit (INDEPENDENT_AMBULATORY_CARE_PROVIDER_SITE_OTHER): Payer: Medicare PPO | Admitting: Student

## 2021-01-13 ENCOUNTER — Other Ambulatory Visit: Payer: Self-pay

## 2021-01-13 VITALS — BP 102/60 | HR 62 | Ht 62.0 in | Wt 175.8 lb

## 2021-01-13 DIAGNOSIS — Z79899 Other long term (current) drug therapy: Secondary | ICD-10-CM | POA: Diagnosis not present

## 2021-01-13 DIAGNOSIS — I255 Ischemic cardiomyopathy: Secondary | ICD-10-CM

## 2021-01-13 DIAGNOSIS — I350 Nonrheumatic aortic (valve) stenosis: Secondary | ICD-10-CM | POA: Diagnosis not present

## 2021-01-13 DIAGNOSIS — I2510 Atherosclerotic heart disease of native coronary artery without angina pectoris: Secondary | ICD-10-CM

## 2021-01-13 DIAGNOSIS — I5022 Chronic systolic (congestive) heart failure: Secondary | ICD-10-CM

## 2021-01-13 LAB — CUP PACEART INCLINIC DEVICE CHECK
Battery Remaining Longevity: 51 mo
Brady Statistic RA Percent Paced: 81 %
Brady Statistic RV Percent Paced: 91 %
Date Time Interrogation Session: 20220804110017
HighPow Impedance: 68.625
Implantable Lead Implant Date: 20200205
Implantable Lead Implant Date: 20200205
Implantable Lead Implant Date: 20200205
Implantable Lead Location: 753858
Implantable Lead Location: 753859
Implantable Lead Location: 753860
Implantable Pulse Generator Implant Date: 20200205
Lead Channel Impedance Value: 1012.5 Ohm
Lead Channel Impedance Value: 462.5 Ohm
Lead Channel Impedance Value: 537.5 Ohm
Lead Channel Pacing Threshold Amplitude: 0.625 V
Lead Channel Pacing Threshold Amplitude: 1 V
Lead Channel Pacing Threshold Amplitude: 1.25 V
Lead Channel Pacing Threshold Amplitude: 1.25 V
Lead Channel Pacing Threshold Pulse Width: 0.5 ms
Lead Channel Pacing Threshold Pulse Width: 0.5 ms
Lead Channel Pacing Threshold Pulse Width: 0.5 ms
Lead Channel Pacing Threshold Pulse Width: 0.5 ms
Lead Channel Sensing Intrinsic Amplitude: 12 mV
Lead Channel Sensing Intrinsic Amplitude: 2.8 mV
Lead Channel Setting Pacing Amplitude: 2 V
Lead Channel Setting Pacing Amplitude: 2 V
Lead Channel Setting Pacing Amplitude: 2.5 V
Lead Channel Setting Pacing Pulse Width: 0.5 ms
Lead Channel Setting Pacing Pulse Width: 0.5 ms
Lead Channel Setting Sensing Sensitivity: 0.5 mV
Pulse Gen Serial Number: 9880571

## 2021-01-13 LAB — BASIC METABOLIC PANEL
BUN/Creatinine Ratio: 10 (ref 10–24)
BUN: 16 mg/dL (ref 8–27)
CO2: 18 mmol/L — ABNORMAL LOW (ref 20–29)
Calcium: 10.3 mg/dL — ABNORMAL HIGH (ref 8.6–10.2)
Chloride: 112 mmol/L — ABNORMAL HIGH (ref 96–106)
Creatinine, Ser: 1.64 mg/dL — ABNORMAL HIGH (ref 0.76–1.27)
Glucose: 164 mg/dL — ABNORMAL HIGH (ref 65–99)
Potassium: 4.8 mmol/L (ref 3.5–5.2)
Sodium: 145 mmol/L — ABNORMAL HIGH (ref 134–144)
eGFR: 41 mL/min/{1.73_m2} — ABNORMAL LOW (ref >59)

## 2021-01-13 LAB — CBC
Hematocrit: 49.3 % (ref 37.5–51.0)
Hemoglobin: 15.9 g/dL (ref 13.0–17.7)
MCH: 29.2 pg (ref 26.6–33.0)
MCHC: 32.3 g/dL (ref 31.5–35.7)
MCV: 91 fL (ref 79–97)
Platelets: 215 10*3/uL (ref 150–450)
RBC: 5.44 x10E6/uL (ref 4.14–5.80)
RDW: 14.5 % (ref 11.6–15.4)
WBC: 7.3 10*3/uL (ref 3.4–10.8)

## 2021-01-13 NOTE — Patient Instructions (Signed)
Medication Instructions:  Your physician recommends that you continue on your current medications as directed. Please refer to the Current Medication list given to you today.  *If you need a refill on your cardiac medications before your next appointment, please call your pharmacy*   Lab Work: TODAY: BMET, CBC  If you have labs (blood work) drawn today and your tests are completely normal, you will receive your results only by: MyChart Message (if you have MyChart) OR A paper copy in the mail If you have any lab test that is abnormal or we need to change your treatment, we will call you to review the results.   Follow-Up: At [facility], you and your health needs are our priority.  As part of our continuing mission to provide you with exceptional heart care, we have created designated Provider Care Teams.  These Care Teams include your primary Cardiologist (physician) and Advanced Practice Providers (APPs -  Physician Assistants and Nurse Practitioners) who all work together to provide you with the care you need, when you need it.   Your next appointment:   6 month(s)  The format for your next appointment:   In Person  Provider:   Michael "Andy" Tillery, PA-C  { 

## 2021-01-17 ENCOUNTER — Ambulatory Visit (INDEPENDENT_AMBULATORY_CARE_PROVIDER_SITE_OTHER): Payer: Medicare PPO

## 2021-01-17 DIAGNOSIS — I255 Ischemic cardiomyopathy: Secondary | ICD-10-CM | POA: Diagnosis not present

## 2021-01-18 LAB — CUP PACEART REMOTE DEVICE CHECK
Battery Remaining Longevity: 53 mo
Battery Remaining Percentage: 61 %
Battery Voltage: 2.95 V
Brady Statistic AP VP Percent: 86 %
Brady Statistic AP VS Percent: 2.7 %
Brady Statistic AS VP Percent: 1.3 %
Brady Statistic AS VS Percent: 1 %
Brady Statistic RA Percent Paced: 76 %
Date Time Interrogation Session: 20220808020015
HighPow Impedance: 66 Ohm
HighPow Impedance: 66 Ohm
Implantable Lead Implant Date: 20200205
Implantable Lead Implant Date: 20200205
Implantable Lead Implant Date: 20200205
Implantable Lead Location: 753858
Implantable Lead Location: 753859
Implantable Lead Location: 753860
Implantable Pulse Generator Implant Date: 20200205
Lead Channel Impedance Value: 430 Ohm
Lead Channel Impedance Value: 450 Ohm
Lead Channel Impedance Value: 950 Ohm
Lead Channel Pacing Threshold Amplitude: 0.75 V
Lead Channel Pacing Threshold Amplitude: 1 V
Lead Channel Pacing Threshold Amplitude: 1.25 V
Lead Channel Pacing Threshold Pulse Width: 0.5 ms
Lead Channel Pacing Threshold Pulse Width: 0.5 ms
Lead Channel Pacing Threshold Pulse Width: 0.5 ms
Lead Channel Sensing Intrinsic Amplitude: 12 mV
Lead Channel Sensing Intrinsic Amplitude: 2.7 mV
Lead Channel Setting Pacing Amplitude: 2 V
Lead Channel Setting Pacing Amplitude: 2 V
Lead Channel Setting Pacing Amplitude: 2.5 V
Lead Channel Setting Pacing Pulse Width: 0.5 ms
Lead Channel Setting Pacing Pulse Width: 0.5 ms
Lead Channel Setting Sensing Sensitivity: 0.5 mV
Pulse Gen Serial Number: 9880571

## 2021-01-19 DIAGNOSIS — E1121 Type 2 diabetes mellitus with diabetic nephropathy: Secondary | ICD-10-CM | POA: Diagnosis not present

## 2021-01-19 DIAGNOSIS — Z6828 Body mass index (BMI) 28.0-28.9, adult: Secondary | ICD-10-CM | POA: Diagnosis not present

## 2021-01-19 DIAGNOSIS — I5022 Chronic systolic (congestive) heart failure: Secondary | ICD-10-CM | POA: Diagnosis not present

## 2021-01-19 DIAGNOSIS — Z0000 Encounter for general adult medical examination without abnormal findings: Secondary | ICD-10-CM | POA: Diagnosis not present

## 2021-01-19 DIAGNOSIS — J449 Chronic obstructive pulmonary disease, unspecified: Secondary | ICD-10-CM | POA: Diagnosis not present

## 2021-01-19 DIAGNOSIS — N1831 Chronic kidney disease, stage 3a: Secondary | ICD-10-CM | POA: Diagnosis not present

## 2021-01-20 DIAGNOSIS — H2513 Age-related nuclear cataract, bilateral: Secondary | ICD-10-CM | POA: Diagnosis not present

## 2021-01-20 DIAGNOSIS — E119 Type 2 diabetes mellitus without complications: Secondary | ICD-10-CM | POA: Diagnosis not present

## 2021-01-24 ENCOUNTER — Ambulatory Visit (INDEPENDENT_AMBULATORY_CARE_PROVIDER_SITE_OTHER): Payer: Medicare PPO

## 2021-01-24 DIAGNOSIS — Z95810 Presence of automatic (implantable) cardiac defibrillator: Secondary | ICD-10-CM

## 2021-01-24 DIAGNOSIS — I5022 Chronic systolic (congestive) heart failure: Secondary | ICD-10-CM

## 2021-01-26 IMAGING — CR DG CHEST 2V
2 series · 2 of 2 positions shown · non-contrast
Comparison: 03/06/2018, 11/23/2017

CLINICAL DATA: Cardiac device in situ

EXAM:
CHEST - 2 VIEW

[chest pa]
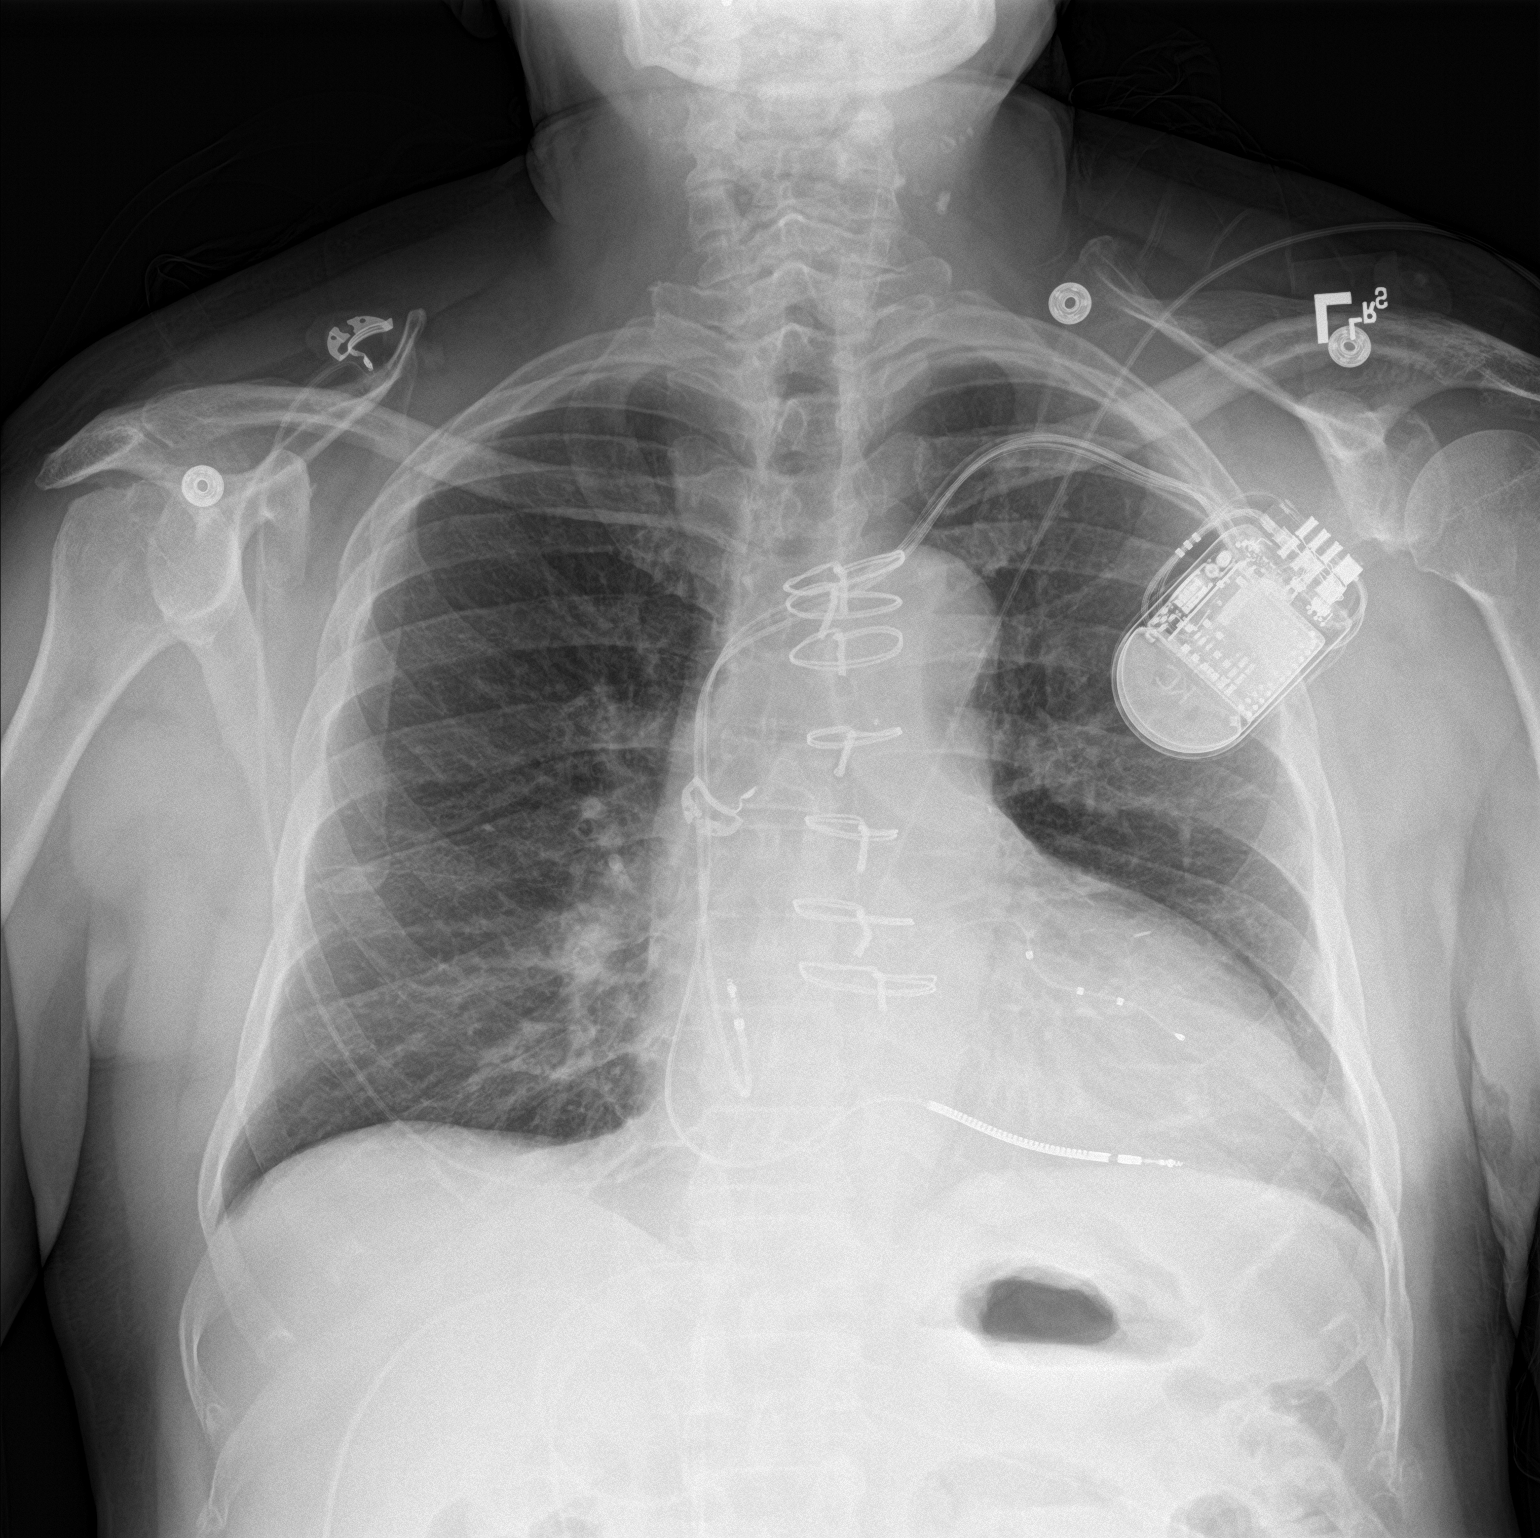

[chest lat]
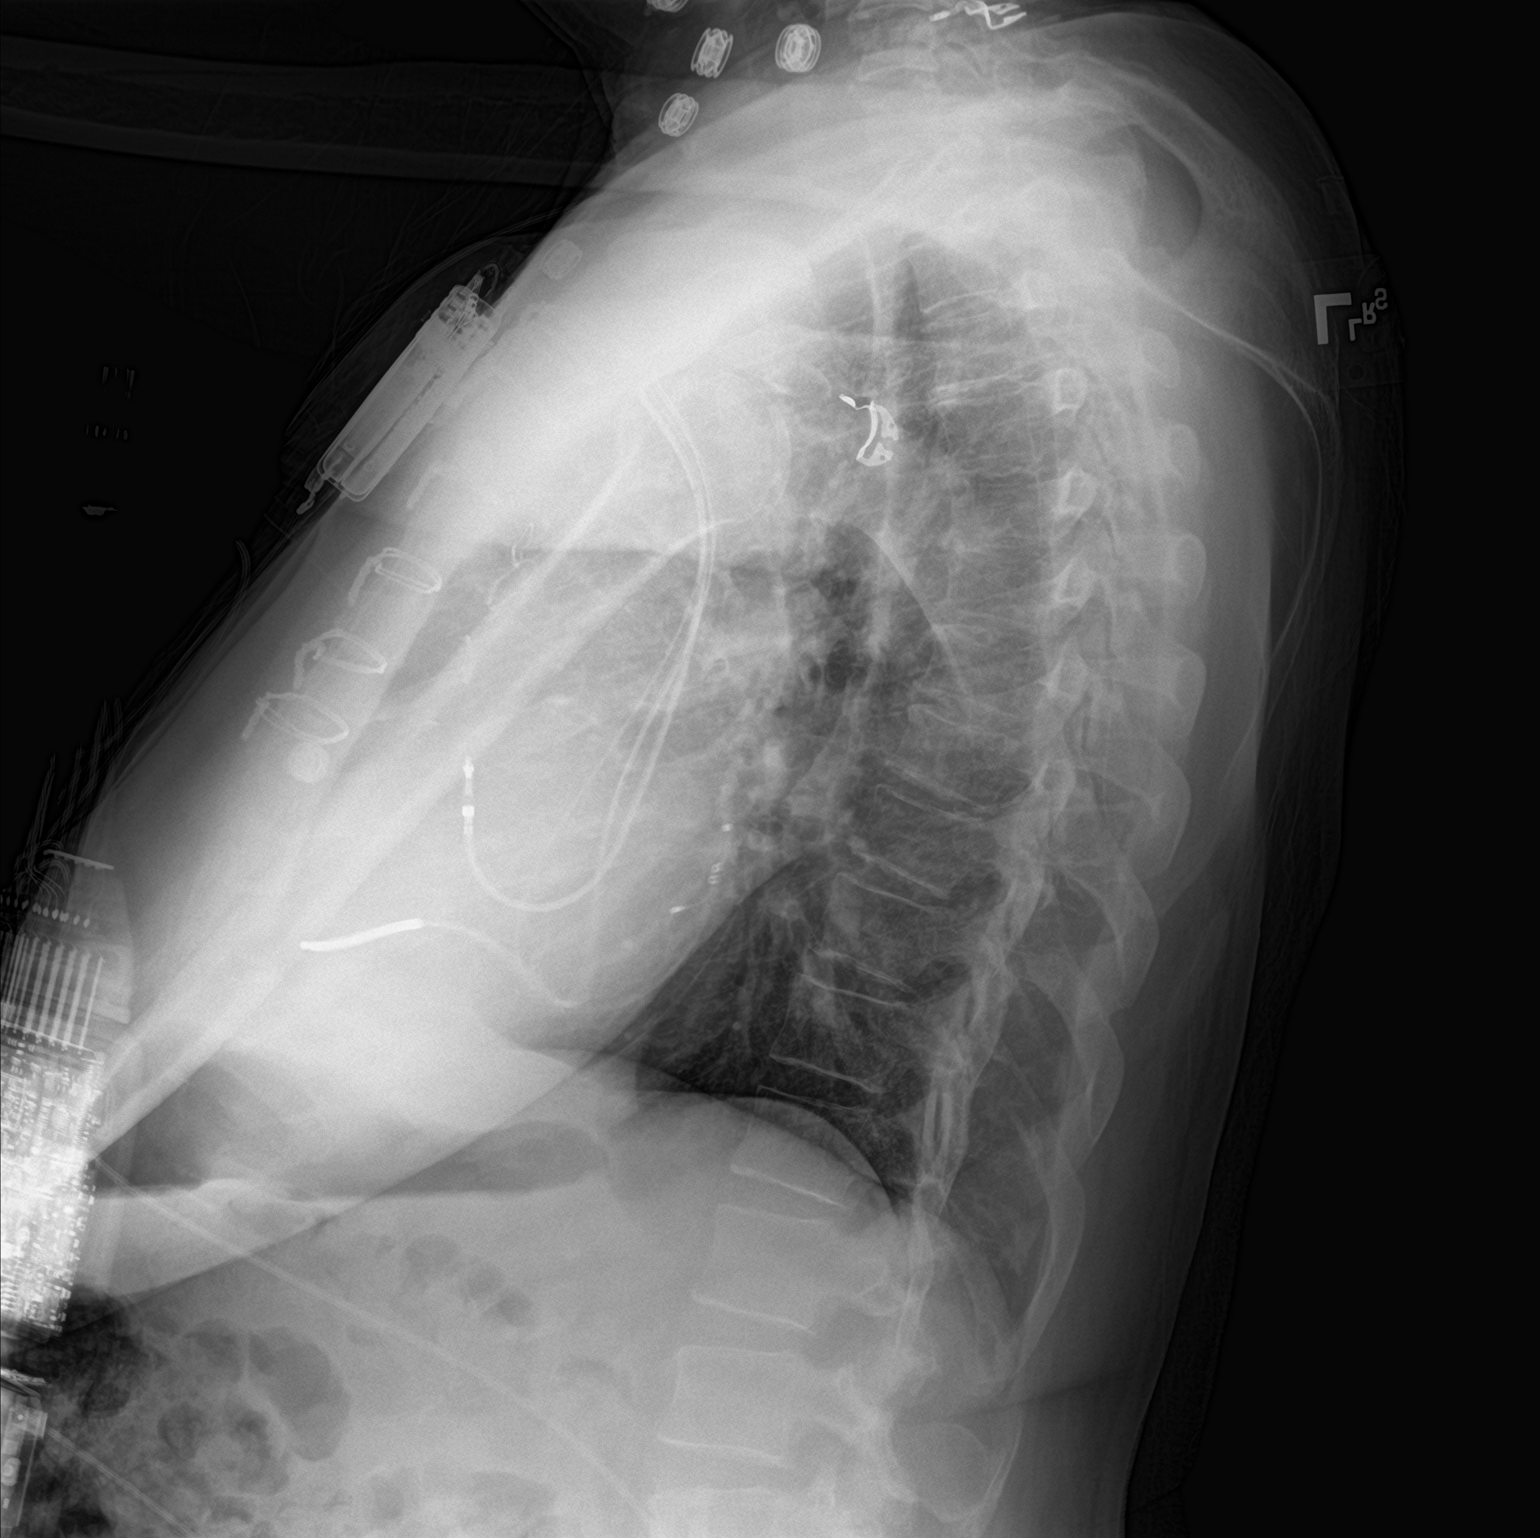

[2 of 2 positions shown; findings below may reference images not displayed]

FINDINGS: Interval placement of internal pacer defibrillator with leads in the
right atrium and right ventricle. Third lead in the region of the
coronary sinus. No pneumothorax

Postop open heart surgery and median sternotomy. Mild cardiac
enlargement. Negative for heart failure. Lungs are clear without
infiltrate or effusion.
IMPRESSION: Satisfactory pacer defibrillator placement.  Lungs are clear.

## 2021-01-27 ENCOUNTER — Telehealth: Payer: Self-pay

## 2021-01-27 NOTE — Telephone Encounter (Signed)
No answer/ patient not accepting messages right now.

## 2021-01-28 NOTE — Progress Notes (Signed)
EPIC Encounter for ICM Monitoring  Patient Name: Allen [name] is a 87 y.o. male Date: 01/28/2021 Primary Care Physican: [name], MD Primary Cardiologist: Diona Browner Electrophysiologist: Allred Bi-V Pacing: 88%  (previous report on 8/8)          Weight:  177 lbs   AT/AF Burden 0%  (previous report on 8/8)          Spoke with patient and heart failure questions reviewed.  Pt asymptomatic for fluid accumulation and feeling well.  No symptoms during decreased impedance.  Pt reports he takes PRN Furosemide almost every day.     CorVue thoracic impedance suggesting normal fluid levels but was suggesting possible fluid accumulation from 8/5-8/14.   Prescribed: Furosemide 20 mg Take 1 tablet by mouth daily as needed. Weight gain of 2-3 lbs in a 24 hour period or 5 lbs in one week.   Labs: 10/13/2020 Creatinine 1.61, BUN 16, Potassium 4.8, Sodium 144 07/08/2020 Creatinine 1.46, BUN 20, Potassium 4.7, Sodium 142, GFR 44-50  A complete set of results can be found in Results Review.   Recommendations: Recommendation to limit salt intake to 2000 mg daily and fluid intake to 64 oz daily.  Encouraged to call if experiencing any fluid symptoms.    Follow-up plan: ICM clinic phone appointment on 02/28/2021.   91 day device clinic remote transmission 01/17/2021.     EP/Cardiology Office Visits: 06/27/2021 with Dr. Diona Browner.  Recall 07/12/2021 with [name], PA.     Copy of ICM check sent to Dr. Johney Frame.     3 month ICM trend: 01/28/2021.    [name], RN 01/28/2021 2:58 PM

## 2021-02-04 DIAGNOSIS — J449 Chronic obstructive pulmonary disease, unspecified: Secondary | ICD-10-CM | POA: Diagnosis not present

## 2021-02-09 ENCOUNTER — Telehealth (INDEPENDENT_AMBULATORY_CARE_PROVIDER_SITE_OTHER): Payer: Medicare PPO | Admitting: Student

## 2021-02-09 ENCOUNTER — Other Ambulatory Visit: Payer: Self-pay

## 2021-02-09 DIAGNOSIS — I5022 Chronic systolic (congestive) heart failure: Secondary | ICD-10-CM | POA: Diagnosis not present

## 2021-02-09 DIAGNOSIS — Z01818 Encounter for other preprocedural examination: Secondary | ICD-10-CM

## 2021-02-09 DIAGNOSIS — J449 Chronic obstructive pulmonary disease, unspecified: Secondary | ICD-10-CM | POA: Diagnosis not present

## 2021-02-09 DIAGNOSIS — E1121 Type 2 diabetes mellitus with diabetic nephropathy: Secondary | ICD-10-CM | POA: Diagnosis not present

## 2021-02-09 NOTE — Progress Notes (Signed)
Remote ICD transmission.   

## 2021-02-09 NOTE — Progress Notes (Signed)
Electrophysiology TeleHealth Note   Due to national recommendations of social distancing due to COVID 19, an audio telehealth visit is felt to be most appropriate for this patient at this time.  Verbal consent was obtained from the patent by me for this telephone visit today.   Date:  02/09/2021   ID:  Allen [name], DOB January 03, 1934, MRN [id number]  Location: patient's home  Provider location: [facility]  Evaluation Performed: Follow-up visit  PCP:  [name], MD  Electrophysiologist: Dr. Johney Frame   Chief Complaint:  Chronic systolic CHF  History of Present Illness:    Allen [name] is a 87 y.o. male who presents via audio conferencing for a telehealth visit today.  Since last being seen in our clinic, the patient reports doing about the same.    He feels like overall he has more bad days than good. He can walk around the grocery story at pace with a cart, as previously stated, about half the time he uses a motorized cart. He can mostly do his ADLs without difficulty. Today, he denies symptoms of palpitations, chest pain, lower extremity edema, dizziness, presyncope, or syncope. The patient is otherwise without complaint today.  The patient denies symptoms of fevers, chills, cough, or new SOB worrisome for COVID 19.  St. Jude BiV ICD implanted 07/17/2018 for ICM, NYHA III, SSS, LBBB History of appropriate therapy: No History of AAD therapy: No   Past Medical History:  Diagnosis Date   Biventricular ICD (implantable cardioverter-defibrillator) in place 07/17/2018   CAD (coronary artery disease)    a. 04/2016: s/p CABG with LIMA-LAD, SVG-D1, SVG-RCA, and seq SVG-OM1-OM2   Essential hypertension    Hyperlipidemia    Ischemic cardiomyopathy    LVEF 35% May 2016   LBBB (left bundle branch block)    Pneumonia due to COVID-19 virus    Sinus bradycardia    Type 2 diabetes mellitus (HCC)     Past Surgical History:  Procedure Laterality Date   BIV ICD INSERTION CRT-D   07/17/2018   BIV ICD INSERTION CRT-D N/A 07/17/2018   Procedure: BIV ICD INSERTION CRT-D;  Surgeon: Hillis Range, MD;  Location: [facility] INVASIVE CV LAB;  Service: Cardiovascular;  Laterality: N/A;   CARDIAC CATHETERIZATION N/A 11/02/2014   Procedure: Left Heart Cath and Coronary Angiography;  Surgeon: Lyn Records, MD;  Location: [facility] INVASIVE CV LAB;  Service: Cardiovascular;  Laterality: N/A;   CARDIAC CATHETERIZATION N/A 05/03/2016   Procedure: Left Heart Cath and Coronary Angiography;  Surgeon: Peter M Swaziland, MD;  Location: [facility] INVASIVE CV LAB;  Service: Cardiovascular;  Laterality: N/A;   CORONARY ARTERY BYPASS GRAFT N/A 05/08/2016   Procedure: CORONARY ARTERY BYPASS GRAFTING (CABG) x 5;  Surgeon: Alleen Borne, MD;  Location: [facility] OR;  Service: Open Heart Surgery;  Laterality: N/A;   ENDOVEIN HARVEST OF GREATER SAPHENOUS VEIN  05/08/2016   Procedure: ENDOVEIN HARVEST OF GREATER SAPHENOUS VEIN;  Surgeon: Alleen Borne, MD;  Location: [facility] OR;  Service: Open Heart Surgery;;   TEE WITHOUT CARDIOVERSION N/A 05/08/2016   Procedure: TRANSESOPHAGEAL ECHOCARDIOGRAM (TEE);  Surgeon: Alleen Borne, MD;  Location: [facility] OR;  Service: Open Heart Surgery;  Laterality: N/A;    Current Outpatient Medications  Medication Sig Dispense Refill   amLODipine (NORVASC) 5 MG tablet Take 2 tablets by mouth daily.     aspirin 81 MG EC tablet Take 81 mg by mouth daily.     atorvastatin (LIPITOR) 80 MG tablet Take 1  tablet (80 mg total) by mouth daily at 6 PM. 90 tablet 3   carvedilol (COREG) 6.25 MG tablet Take 1 tablet (6.25 mg total) by mouth 2 (two) times daily with a meal. (Patient not taking: Reported on 01/13/2021) 180 tablet 1   clopidogrel (PLAVIX) 75 MG tablet TAKE 1 TABLET EVERY DAY WITH BREAKFAST 90 tablet 1   empagliflozin (JARDIANCE) 10 MG TABS tablet Take 1 tablet (10 mg total) by mouth daily before breakfast. (Patient not taking: Reported on 01/13/2021) 30 tablet 6   furosemide (LASIX) 20 MG tablet Take 20 mg by  mouth daily as needed. Weight gain of 2-3 lbs in a 24 hour period or 5 lbs in one week     Insulin Glargine (LANTUS SOLOSTAR) 100 UNIT/ML Solostar Pen Inject 20 Units into the skin at bedtime. 15 mL 11   Insulin Lispro Junior KwikPen 100 UNIT/ML SOPN      mirtazapine (REMERON) 15 MG tablet Take 15 mg by mouth daily at 12 noon.  (Patient not taking: Reported on 01/13/2021)     nitroGLYCERIN (NITROSTAT) 0.4 MG SL tablet Place 0.4 mg under the tongue every 5 (five) minutes as needed for chest pain (If no relief after 3rd dose, proceed to the ED).     OXYGEN 3 lpm 24/7 DME- [employer]     repaglinide (PRANDIN) 2 MG tablet Take 2 mg by mouth 2 (two) times daily before a meal.  (Patient not taking: Reported on 01/13/2021)     sacubitril-valsartan (ENTRESTO) 49-51 MG Take 1 tablet by mouth 2 (two) times daily. (Patient not taking: Reported on 01/13/2021) 180 tablet 1   vitamin B-12 (CYANOCOBALAMIN) 1000 MCG tablet  (Patient not taking: Reported on 01/13/2021)     No current facility-administered medications for this visit.    Allergies:   Patient has no known allergies.   Social History:  The patient  reports that he quit smoking about 9 years ago. His smoking use included cigarettes. He started smoking about 65 years ago. He has a 28.00 pack-year smoking history. He has never used smokeless tobacco. He reports that he does not drink alcohol and does not use drugs.   Family History:  The patient's  family history includes Coronary artery disease (age of onset: 57) in his brother; Unexplained death in his father and mother.   ROS:  Please see the history of present illness.   All other systems are personally reviewed and negative.    Exam:    Vital Signs:   Well sounding, not distressed   Labs/Other Tests and Data Reviewed:    Recent Labs: 07/08/2020: Magnesium 2.1 10/13/2020: NT-Pro BNP 935 01/13/2021: BUN 16; Creatinine, Ser 1.64; Hemoglobin 15.9; Platelets 215; Potassium 4.8; Sodium 145   Wt Readings  from Last 3 Encounters:  01/13/21 175 lb 12.8 oz (79.7 kg)  12/22/20 177 lb (80.3 kg)  12/06/20 179 lb 6.4 oz (81.4 kg)     Other studies personally reviewed: Additional studies/ records that were reviewed today include: Note from 01/13/21 which reviews most recent labwork and most recent Echo.  Labs from 01/13/21 reviewed  Last device remote is reviewed from PaceART PDF dated 01/18/2021 which reveals normal device function, Known PVCs with burden ~10%.   ASSESSMENT & PLAN:    1.  Chronic systolic CHF Primary purpose of today's phone call was to review indications, goals, and risks/benefits of BAT. Have previously discussed Barostim consideration with patient who was not interested in proceeding at the time.  S/p AV optimization  10/27/2019. He felt somewhat better and has maintained this. V-V optimization 02/2020. QRS 176 > 122 ms after reprogramming with positive/RBBB V1 and biphasic lead 1. Previous optimization imaging was reviewed and felt appropriate to increase PAV delay at that time, with intrinsic A to R of 300 ms. I discussed BAT with the patient and his niece at length. He remains somewhat unsure, but wants to proceed if there is any chance of him feeling better.  He was noted to have mild/mod carotid calcifications in the past, and understands this may be a limiting factor. He would like me to go ahead and order VAS US CAROTID and refer to Dr. Myra Gianotti for a surgical perspective.  He wears O2 at baseline but is fairly mobile. I think he would be a relatively good candidate despite his advanced age.   COVID 19 screen The patient denies symptoms of COVID 19 at this time.  The importance of social distancing was discussed today.  Follow-up: Call back scheduled for January.  Next remote: 02/28/2021 (31 day ICM visit)  Patient Risk:  after full review of this patients clinical status, I feel that they are at moderate risk at this time.  Today, I have spent 14 minutes with the patient with  telehealth technology discussing the above. Dustin Flock, PA-C  02/09/2021 9:52 AM     [facility] [address] [place] [address] [phone] (office) [phone] (fax)

## 2021-02-12 DIAGNOSIS — J449 Chronic obstructive pulmonary disease, unspecified: Secondary | ICD-10-CM | POA: Diagnosis not present

## 2021-02-17 DIAGNOSIS — Z6828 Body mass index (BMI) 28.0-28.9, adult: Secondary | ICD-10-CM | POA: Diagnosis not present

## 2021-02-17 DIAGNOSIS — M5440 Lumbago with sciatica, unspecified side: Secondary | ICD-10-CM | POA: Diagnosis not present

## 2021-02-21 DIAGNOSIS — M545 Low back pain, unspecified: Secondary | ICD-10-CM | POA: Diagnosis not present

## 2021-02-28 ENCOUNTER — Ambulatory Visit (INDEPENDENT_AMBULATORY_CARE_PROVIDER_SITE_OTHER): Payer: Medicare PPO

## 2021-02-28 DIAGNOSIS — Z95810 Presence of automatic (implantable) cardiac defibrillator: Secondary | ICD-10-CM | POA: Diagnosis not present

## 2021-02-28 DIAGNOSIS — I5022 Chronic systolic (congestive) heart failure: Secondary | ICD-10-CM | POA: Diagnosis not present

## 2021-02-28 NOTE — Progress Notes (Signed)
EPIC Encounter for ICM Monitoring  Patient Name: Allen [name] is a 87 y.o. male Date: 02/28/2021 Primary Care Physican: [name], MD Primary Cardiologist: Diona Browner Electrophysiologist: Allred Bi-V Pacing: 88%     02/28/2021 Weight:  177 lbs   AT/AF Burden <1%           Spoke with patient and heart failure questions reviewed.  Pt asymptomatic for fluid accumulation and feeling well.  No symptoms during decreased impedance.  He does not use a lot of salt in foods and wife normally cooks for him.   CorVue thoracic impedance suggesting possible fluid accumulation starting 9/16.   Prescribed:  Furosemide 20 mg Take 1 tablet by mouth daily as needed. Weight gain of 2-3 lbs in a 24 hour period or 5 lbs in one week.  Pt reports 02/28/21 he is taking Furosemide daily instead of PRN.   Labs: 10/13/2020 Creatinine 1.61, BUN 16, Potassium 4.8, Sodium 144 07/08/2020 Creatinine 1.46, BUN 20, Potassium 4.7, Sodium 142, GFR 44-50  A complete set of results can be found in Results Review.   Recommendations: Recommendation to limit salt intake to 2000 mg daily and fluid intake to 64 oz daily.  Encouraged to call if experiencing any fluid symptoms.    Follow-up plan: ICM clinic phone appointment on 03/08/2021 to recheck fluid levels.   91 day device clinic remote transmission 04/18/2021.     EP/Cardiology Office Visits: 06/27/2021 with Dr. Diona Browner.  Recall 07/12/2021 with [name], PA.     Copy of ICM check sent to Dr. Johney Frame and Dr Diona Browner for review.      3 month ICM trend: 02/28/2021.    1 Year ICM trend:       [name], RN 02/28/2021 1:30 PM

## 2021-03-07 DIAGNOSIS — J449 Chronic obstructive pulmonary disease, unspecified: Secondary | ICD-10-CM | POA: Diagnosis not present

## 2021-03-08 ENCOUNTER — Ambulatory Visit (INDEPENDENT_AMBULATORY_CARE_PROVIDER_SITE_OTHER): Payer: Medicare PPO

## 2021-03-08 DIAGNOSIS — Z95810 Presence of automatic (implantable) cardiac defibrillator: Secondary | ICD-10-CM

## 2021-03-08 DIAGNOSIS — I5022 Chronic systolic (congestive) heart failure: Secondary | ICD-10-CM

## 2021-03-08 NOTE — Progress Notes (Signed)
EPIC Encounter for ICM Monitoring  Patient Name: Allen [name] is a 87 y.o. male Date: 03/08/2021 Primary Care Physican: [name], MD Primary Cardiologist: Diona Browner Electrophysiologist: Allred Bi-V Pacing: 87%     02/28/2021 Weight:  177 lbs   AT/AF Burden <1%           Spoke with patient and heart failure questions reviewed.  Pt asymptomatic for fluid accumulation. He thinks he ate foods high in salt the last couple of days that may contribute to decreased impedance.     CorVue thoracic impedance suggesting fluid levels returned to normal 9/20-9/25 but returned to below baseline starting 9/26.   Prescribed:  Furosemide 20 mg Take 1 tablet by mouth daily as needed. Weight gain of 2-3 lbs in a 24 hour period or 5 lbs in one week.  Pt reports 02/28/21 he is taking Furosemide daily instead of PRN.   Labs: 01/13/2021 Creatinine 1.64, BUN 16, Potassium 4.8, Sodium 145 12/29/2020 Creatinine 2.02, BUN 24, Potassium 4.7, Sodium 142, GFR 32  10/13/2020 Creatinine 1.61, BUN 16, Potassium 4.8, Sodium 144 07/08/2020 Creatinine 1.46, BUN 20, Potassium 4.7, Sodium 142, GFR 44-50  A complete set of results can be found in Results Review.   Recommendations: Recommendation to limit salt intake to 2000 mg daily and fluid intake to 64 oz daily.  Encouraged to call if experiencing any fluid symptoms.    Follow-up plan: ICM clinic phone appointment on 03/21/2021 to recheck fluid levels.   91 day device clinic remote transmission 04/18/2021.     EP/Cardiology Office Visits: 06/27/2021 with Dr. Diona Browner.  Recall 07/12/2021 with [name], PA.     Copy of ICM check sent to Dr. Johney Frame and Dr Diona Browner.   3 month ICM trend: 03/08/2021.    1 Year ICM trend:       [name], RN 03/08/2021 8:14 AM

## 2021-03-14 DIAGNOSIS — J449 Chronic obstructive pulmonary disease, unspecified: Secondary | ICD-10-CM | POA: Diagnosis not present

## 2021-03-21 ENCOUNTER — Ambulatory Visit (INDEPENDENT_AMBULATORY_CARE_PROVIDER_SITE_OTHER): Payer: Medicare PPO

## 2021-03-21 DIAGNOSIS — Z95810 Presence of automatic (implantable) cardiac defibrillator: Secondary | ICD-10-CM

## 2021-03-21 DIAGNOSIS — I5022 Chronic systolic (congestive) heart failure: Secondary | ICD-10-CM

## 2021-03-23 DIAGNOSIS — N401 Enlarged prostate with lower urinary tract symptoms: Secondary | ICD-10-CM | POA: Diagnosis not present

## 2021-03-23 NOTE — Progress Notes (Signed)
EPIC Encounter for ICM Monitoring  Patient Name: Allen [name] is a 87 y.o. male Date: 03/23/2021 Primary Care Physican: [name], MD Primary Cardiologist: Diona Browner Electrophysiologist: Allred Bi-V Pacing: 88%     02/28/2021 Weight:  177 lbs   AT/AF Burden <1%           Spoke with patient and heart failure questions reviewed.  Pt asymptomatic for fluid accumulation.  He adjusted his diet and decreased salt intake     CorVue thoracic impedance suggesting fluid levels returned to normal.   Prescribed:  Furosemide 20 mg Take 1 tablet by mouth daily as needed. Weight gain of 2-3 lbs in a 24 hour period or 5 lbs in one week.  Pt reports 02/28/21 he is taking Furosemide daily instead of PRN.   Labs: 01/13/2021 Creatinine 1.64, BUN 16, Potassium 4.8, Sodium 145 12/29/2020 Creatinine 2.02, BUN 24, Potassium 4.7, Sodium 142, GFR 32  10/13/2020 Creatinine 1.61, BUN 16, Potassium 4.8, Sodium 144 07/08/2020 Creatinine 1.46, BUN 20, Potassium 4.7, Sodium 142, GFR 44-50  A complete set of results can be found in Results Review.   Recommendations:   Encouraged to call if experiencing any fluid symptoms.    Follow-up plan: ICM clinic phone appointment on 04/19/2021.   91 day device clinic remote transmission 04/18/2021.     EP/Cardiology Office Visits: 06/27/2021 with Dr. Diona Browner.  Recall 07/12/2021 with [name], PA.     Copy of ICM check sent to Dr. Johney Frame.   3 month ICM trend: 03/21/2021.    1 Year ICM trend:       [name], RN 03/23/2021 2:21 PM

## 2021-03-28 ENCOUNTER — Encounter: Payer: Medicare PPO | Admitting: Surgery

## 2021-03-28 ENCOUNTER — Encounter (HOSPITAL_COMMUNITY): Payer: Medicare PPO

## 2021-04-06 ENCOUNTER — Encounter: Payer: Medicare PPO | Admitting: Vascular Surgery

## 2021-04-06 ENCOUNTER — Encounter (HOSPITAL_COMMUNITY): Payer: Medicare PPO

## 2021-04-06 DIAGNOSIS — J449 Chronic obstructive pulmonary disease, unspecified: Secondary | ICD-10-CM | POA: Diagnosis not present

## 2021-04-11 ENCOUNTER — Encounter: Payer: Self-pay | Admitting: Surgery

## 2021-04-11 ENCOUNTER — Ambulatory Visit (HOSPITAL_COMMUNITY)
Admission: RE | Admit: 2021-04-11 | Discharge: 2021-04-11 | Disposition: A | Discharge status: Home / Self Care | Payer: Medicare PPO | Source: Ambulatory Visit | Attending: Student | Admitting: Student

## 2021-04-11 ENCOUNTER — Other Ambulatory Visit: Payer: Self-pay

## 2021-04-11 ENCOUNTER — Ambulatory Visit (INDEPENDENT_AMBULATORY_CARE_PROVIDER_SITE_OTHER): Payer: Medicare PPO | Admitting: Surgery

## 2021-04-11 VITALS — BP 129/85 | HR 57 | Temp 98.1°F | Resp 20 | Ht 62.0 in | Wt 175.0 lb

## 2021-04-11 DIAGNOSIS — Z01818 Encounter for other preprocedural examination: Secondary | ICD-10-CM | POA: Diagnosis not present

## 2021-04-11 DIAGNOSIS — I5020 Unspecified systolic (congestive) heart failure: Secondary | ICD-10-CM

## 2021-04-11 NOTE — Progress Notes (Signed)
[facility]  Patient name: Allen [name] MRN: [id number] DOB: 12/06/1933 Sex: male   REQUESTING PROVIDER:    [name]   REASON FOR CONSULT:    Barostim evaluation  HISTORY OF PRESENT ILLNESS:   Allen [name] is a 87 y.o. male, who is referred for Barostim evaluation his most recent echocardiogram showed an ejection fraction of 35% in February 2022.  He is on oxygen periodically at home.  According to him he gets around fairly well but is limited by his shortness of breath which is cardiac in origin   The patient had a ICD placed in 2020.  He has a history of coronary artery disease, status post CABG in 2017.  He is medically managed for hypertension.  He is on a statin for hypercholesterolemia.  He is a former smoker.  He is medically managed for type 2 diabetes. 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 04/2016: s/p CABG with LIMA-LAD, SVG-D1, SVG-RCA, and seq SVG-OM1-OM2   Essential hypertension    Hyperlipidemia    Ischemic cardiomyopathy    LVEF 35% May 2016   LBBB (left bundle branch block)    Pneumonia due to COVID-19 virus    Sinus bradycardia    Type 2 diabetes mellitus (HCC)      FAMILY HISTORY   Family History  Problem Relation Age of Onset   Unexplained death Father    Unexplained death Mother        Old age   Coronary artery disease Brother 58    SOCIAL HISTORY:   Social History   Socioeconomic History   Marital status: Married    Spouse name: Not on file   Number of children: Not on file   Years of education: Not on file   Highest education level: Not on file  Occupational History   Occupation: [name]    Comment: Retired  Tobacco Use   Smoking status: Former    Packs/day: 0.50    Years: 56.00    Pack years: 28.00    Types:  Cigarettes    Start date: 02/18/1956    Quit date: 06/13/2011    Years since quitting: 9.8   Smokeless tobacco: Never  Vaping Use   Vaping Use: Never used  Substance and Sexual Activity   Alcohol use: No    Alcohol/week: 0.0 standard drinks   Drug use: No   Sexual activity: Never  Other Topics Concern   Not on file  Social History Narrative   Retired Naval architect, 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     aspirin 81 MG EC tablet Take 81 mg by mouth daily.     atorvastatin (LIPITOR) 80 MG tablet Take 1 tablet (80 mg total) by mouth daily at 6 PM. 90 tablet 3   carvedilol (COREG) 6.25 MG tablet Take 1  tablet (6.25 mg total) by mouth 2 (two) times daily with a meal. (Patient not taking: Reported on 01/13/2021) 180 tablet 1   clopidogrel (PLAVIX) 75 MG tablet TAKE 1 TABLET EVERY DAY WITH BREAKFAST 90 tablet 1   empagliflozin (JARDIANCE) 10 MG TABS tablet Take 1 tablet (10 mg total) by mouth daily before breakfast. (Patient not taking: Reported on 01/13/2021) 30 tablet 6   furosemide (LASIX) 20 MG tablet Take 20 mg by mouth daily as needed. Weight gain of 2-3 lbs in a 24 hour period or 5 lbs in one week     Insulin Glargine (LANTUS SOLOSTAR) 100 UNIT/ML Solostar Pen Inject 20 Units into the skin at bedtime. 15 mL 11   Insulin Lispro Junior KwikPen 100 UNIT/ML SOPN      mirtazapine (REMERON) 15 MG tablet Take 15 mg by mouth daily at 12 noon.  (Patient not taking: Reported on 01/13/2021)     nitroGLYCERIN (NITROSTAT) 0.4 MG SL tablet Place 0.4 mg  under the tongue every 5 (five) minutes as needed for chest pain (If no relief after 3rd dose, proceed to the ED).     OXYGEN 3 lpm 24/7 DME- [employer]     repaglinide (PRANDIN) 2 MG tablet Take 2 mg by mouth 2 (two) times daily before a meal.  (Patient not taking: Reported on 01/13/2021)     sacubitril-valsartan (ENTRESTO) 49-51 MG Take 1 tablet by mouth 2 (two) times daily. (Patient not taking: Reported on 01/13/2021) 180 tablet 1   vitamin B-12 (CYANOCOBALAMIN) 1000 MCG tablet  (Patient not taking: Reported on 01/13/2021)     No current facility-administered medications for this visit.    REVIEW OF SYSTEMS:   [X]  denotes positive finding, [ ]  denotes negative finding Cardiac  Comments:  Chest pain or chest pressure:    Shortness of breath upon exertion: x   Short of breath when lying flat: x   Irregular heart rhythm:        Vascular    Pain in calf, thigh, or hip brought on by ambulation: x   Pain in feet at night that wakes you up from your sleep:     Blood clot in your veins:    Leg swelling:         Pulmonary    Oxygen at home: x   Productive cough:     Wheezing:         Neurologic    Sudden weakness in arms or legs:     Sudden numbness in arms or legs:     Sudden onset of difficulty speaking or slurred speech:    Temporary loss of vision in one eye:     Problems with dizziness:         Gastrointestinal    Blood in stool:      Vomited blood:         Genitourinary    Burning when urinating:     Blood in urine:        Psychiatric    Major depression:         Hematologic    Bleeding problems:    Problems with blood clotting too easily:        Skin    Rashes or ulcers:        Constitutional    Fever or chills:     PHYSICAL EXAM:   There were no vitals filed for this visit.  GENERAL: The patient is a well-nourished male, in no acute distress. The  vital signs are documented above. CARDIAC: There is a regular rate and rhythm.  VASCULAR: I evaluated his carotid  artery with the SonoSite.  There is mild calcification at the bifurcation which is in the mid neck. PULMONARY: Nonlabored respirations ABDOMEN: Soft and non-tender with normal pitched bowel sounds.  MUSCULOSKELETAL: There are no major deformities or cyanosis. NEUROLOGIC: No focal weakness or paresthesias are detected. SKIN: There are no ulcers or rashes noted. PSYCHIATRIC: The patient has a normal affect.  STUDIES:   Reviewed his duplex ultrasound with the following findings: Right Carotid: Velocities in the right ICA are consistent with a 1-39%  stenosis.   Left Carotid: Velocities in the left ICA are consistent with a 1-39%  stenosis.   Vertebrals:  Bilateral vertebral arteries demonstrate antegrade flow.  Subclavians: Normal flow hemodynamics were seen in bilateral subclavian               arteries.  ASSESSMENT and PLAN   NYHA class III heart failure: The patient is on best medical therapy and still has significant symptoms largely from his shortness of breath.  I think he would be a good candidate for a Barostim device.  I discussed the details of the operation with the patient and his family.  All questions were answered.  We discussed the risk of bleeding as well as risk of infection.  We will await insurance approval.   Charlena Cross, MD, FACS [facility] [phone] Pager [phone]

## 2021-04-14 DIAGNOSIS — J449 Chronic obstructive pulmonary disease, unspecified: Secondary | ICD-10-CM | POA: Diagnosis not present

## 2021-04-18 ENCOUNTER — Ambulatory Visit (INDEPENDENT_AMBULATORY_CARE_PROVIDER_SITE_OTHER): Payer: Medicare PPO

## 2021-04-18 DIAGNOSIS — I255 Ischemic cardiomyopathy: Secondary | ICD-10-CM | POA: Diagnosis not present

## 2021-04-18 LAB — CUP PACEART REMOTE DEVICE CHECK
Battery Remaining Longevity: 51 mo
Battery Remaining Percentage: 58 %
Battery Voltage: 2.95 V
Brady Statistic AP VP Percent: 86 %
Brady Statistic AP VS Percent: 2.3 %
Brady Statistic AS VP Percent: 2.8 %
Brady Statistic AS VS Percent: 1 %
Brady Statistic RA Percent Paced: 75 %
Date Time Interrogation Session: 20221107010016
HighPow Impedance: 64 Ohm
HighPow Impedance: 64 Ohm
Implantable Lead Implant Date: 20200205
Implantable Lead Implant Date: 20200205
Implantable Lead Implant Date: 20200205
Implantable Lead Location: 753858
Implantable Lead Location: 753859
Implantable Lead Location: 753860
Implantable Pulse Generator Implant Date: 20200205
Lead Channel Impedance Value: 1050 Ohm
Lead Channel Impedance Value: 450 Ohm
Lead Channel Impedance Value: 530 Ohm
Lead Channel Pacing Threshold Amplitude: 0.75 V
Lead Channel Pacing Threshold Amplitude: 0.875 V
Lead Channel Pacing Threshold Amplitude: 1.25 V
Lead Channel Pacing Threshold Pulse Width: 0.5 ms
Lead Channel Pacing Threshold Pulse Width: 0.5 ms
Lead Channel Pacing Threshold Pulse Width: 0.5 ms
Lead Channel Sensing Intrinsic Amplitude: 12 mV
Lead Channel Sensing Intrinsic Amplitude: 4.2 mV
Lead Channel Setting Pacing Amplitude: 1.875
Lead Channel Setting Pacing Amplitude: 2 V
Lead Channel Setting Pacing Amplitude: 2.5 V
Lead Channel Setting Pacing Pulse Width: 0.5 ms
Lead Channel Setting Pacing Pulse Width: 0.5 ms
Lead Channel Setting Sensing Sensitivity: 0.5 mV
Pulse Gen Serial Number: 9880571

## 2021-04-19 ENCOUNTER — Ambulatory Visit (INDEPENDENT_AMBULATORY_CARE_PROVIDER_SITE_OTHER): Payer: Medicare PPO

## 2021-04-19 DIAGNOSIS — Z95810 Presence of automatic (implantable) cardiac defibrillator: Secondary | ICD-10-CM | POA: Diagnosis not present

## 2021-04-19 DIAGNOSIS — I5022 Chronic systolic (congestive) heart failure: Secondary | ICD-10-CM | POA: Diagnosis not present

## 2021-04-21 NOTE — Progress Notes (Signed)
Remote ICD transmission.   

## 2021-04-22 NOTE — Progress Notes (Signed)
EPIC Encounter for ICM Monitoring  Patient Name: Allen [name] is a 87 y.o. male Date: 04/22/2021 Primary Care Physican: [name], MD Primary Cardiologist: Diona Browner Electrophysiologist: Allred Bi-V Pacing: 88%     04/22/2021 Weight:  177 lbs   AT/AF Burden <1%           Spoke with patient and heart failure questions reviewed.  Pt asymptomatic for fluid accumulation and feeling well.       CorVue thoracic impedance suggesting normal fluid levels.   Prescribed:  Furosemide 20 mg Take 1 tablet by mouth daily as needed. Weight gain of 2-3 lbs in a 24 hour period or 5 lbs in one week.  Pt reports 02/28/21 he is taking Furosemide daily instead of PRN.   Labs: 01/13/2021 Creatinine 1.64, BUN 16, Potassium 4.8, Sodium 145 12/29/2020 Creatinine 2.02, BUN 24, Potassium 4.7, Sodium 142, GFR 32  10/13/2020 Creatinine 1.61, BUN 16, Potassium 4.8, Sodium 144 07/08/2020 Creatinine 1.46, BUN 20, Potassium 4.7, Sodium 142, GFR 44-50  A complete set of results can be found in Results Review.   Recommendations: No changes and encouraged to call if experiencing any fluid symptoms.  Follow-up plan: ICM clinic phone appointment on 05/23/2021.   91 day device clinic remote transmission 07/18/2021.     EP/Cardiology Office Visits: 06/27/2021 with Dr. Diona Browner.  Recall 07/12/2021 with [name], PA.     Copy of ICM check sent to Dr. Johney Frame.    3 month ICM trend: 04/18/2021.    1 Year ICM trend:       [name], RN 04/22/2021 9:03 AM

## 2021-04-28 DIAGNOSIS — M5440 Lumbago with sciatica, unspecified side: Secondary | ICD-10-CM | POA: Diagnosis not present

## 2021-04-28 DIAGNOSIS — I1310 Hypertensive heart and chronic kidney disease without heart failure, with stage 1 through stage 4 chronic kidney disease, or unspecified chronic kidney disease: Secondary | ICD-10-CM | POA: Diagnosis not present

## 2021-04-28 DIAGNOSIS — I10 Essential (primary) hypertension: Secondary | ICD-10-CM | POA: Diagnosis not present

## 2021-04-28 DIAGNOSIS — E1121 Type 2 diabetes mellitus with diabetic nephropathy: Secondary | ICD-10-CM | POA: Diagnosis not present

## 2021-04-28 DIAGNOSIS — Z6828 Body mass index (BMI) 28.0-28.9, adult: Secondary | ICD-10-CM | POA: Diagnosis not present

## 2021-04-28 DIAGNOSIS — N1831 Chronic kidney disease, stage 3a: Secondary | ICD-10-CM | POA: Diagnosis not present

## 2021-04-28 DIAGNOSIS — J449 Chronic obstructive pulmonary disease, unspecified: Secondary | ICD-10-CM | POA: Diagnosis not present

## 2021-04-28 DIAGNOSIS — Z0000 Encounter for general adult medical examination without abnormal findings: Secondary | ICD-10-CM | POA: Diagnosis not present

## 2021-04-29 ENCOUNTER — Telehealth: Payer: Self-pay

## 2021-04-29 ENCOUNTER — Other Ambulatory Visit: Payer: Self-pay

## 2021-04-29 DIAGNOSIS — I5020 Unspecified systolic (congestive) heart failure: Secondary | ICD-10-CM

## 2021-04-29 NOTE — Telephone Encounter (Signed)
Contacted [employer] Jude ICD Rep Bonna Gains to inform of patient's barostim procedure on 05/27/21. Verbalized understanding.

## 2021-05-07 DIAGNOSIS — J449 Chronic obstructive pulmonary disease, unspecified: Secondary | ICD-10-CM | POA: Diagnosis not present

## 2021-05-14 DIAGNOSIS — J449 Chronic obstructive pulmonary disease, unspecified: Secondary | ICD-10-CM | POA: Diagnosis not present

## 2021-05-23 ENCOUNTER — Ambulatory Visit (INDEPENDENT_AMBULATORY_CARE_PROVIDER_SITE_OTHER): Payer: Medicare PPO

## 2021-05-23 ENCOUNTER — Encounter: Payer: Self-pay | Admitting: Internal Medicine

## 2021-05-23 DIAGNOSIS — Z95810 Presence of automatic (implantable) cardiac defibrillator: Secondary | ICD-10-CM

## 2021-05-23 DIAGNOSIS — I5022 Chronic systolic (congestive) heart failure: Secondary | ICD-10-CM

## 2021-05-23 NOTE — Pre-Procedure Instructions (Addendum)
Surgical Instructions    Your procedure is scheduled on Friday, December 16th.  Report to [facility] Main Entrance "A" at 7:30 A.M., then check in with the Admitting office.  Call this number if you have problems the morning of surgery:  [phone]   If you have any questions prior to your surgery date call [phone]: Open Monday-Friday 8am-4pm    Remember:  Do not eat or drink after midnight the night before your surgery   Take these medicines the morning of surgery with A SIP OF WATER  aspirin  carvedilol (COREG)  nitroGLYCERIN (NITROSTAT)-If needed. Please let a nurse know if you had to use this.  As of today, STOP taking any Aspirin (unless otherwise instructed by your surgeon) Aleve, Naproxen, Ibuprofen, Motrin, Advil, Goody's, BC's, all herbal medications, fish oil, and all vitamins.  WHAT DO I DO ABOUT MY DIABETES MEDICATION?   Do not take empagliflozin (JARDIANCE) on Thursday (12/15) or Friday (12/16).  THE NIGHT BEFORE SURGERY, take 10 units of Insulin Glargine (LANTUS SOLOSTAR) insulin.      The day of surgery, do not take repaglinide (PRANDIN).   HOW TO MANAGE YOUR DIABETES BEFORE AND AFTER SURGERY  Why is it important to control my blood sugar before and after surgery? Improving blood sugar levels before and after surgery helps healing and can limit problems. A way of improving blood sugar control is eating a healthy diet by:  Eating less sugar and carbohydrates  Increasing activity/exercise  Talking with your doctor about reaching your blood sugar goals High blood sugars (greater than 180 mg/dL) can raise your risk of infections and slow your recovery, so you will need to focus on controlling your diabetes during the weeks before surgery. Make sure that the doctor who takes care of your diabetes knows about your planned surgery including the date and location.  How do I manage my blood sugar before surgery? Check your blood sugar at least 4 times a day,  starting 2 days before surgery, to make sure that the level is not too high or low.  Check your blood sugar the morning of your surgery when you wake up and every 2 hours until you get to the Short Stay unit.  If your blood sugar is less than 70 mg/dL, you will need to treat for low blood sugar: Do not take insulin. Treat a low blood sugar (less than 70 mg/dL) with  cup of clear juice (cranberry or apple), 4 glucose tablets, OR glucose gel. Recheck blood sugar in 15 minutes after treatment (to make sure it is greater than 70 mg/dL). If your blood sugar is not greater than 70 mg/dL on recheck, call [phone] for further instructions. Report your blood sugar to the short stay nurse when you get to Short Stay.  If you are admitted to the hospital after surgery: Your blood sugar will be checked by the staff and you will probably be given insulin after surgery (instead of oral diabetes medicines) to make sure you have good blood sugar levels. The goal for blood sugar control after surgery is 80-180 mg/dL.                      Do NOT Smoke (Tobacco/Vaping) or drink Alcohol 24 hours prior to your procedure.  If you use a CPAP at night, you may bring all equipment for your overnight stay.   Contacts, glasses, piercing's, hearing aid's, dentures or partials may not be worn into surgery, please bring cases  for these belongings.    For patients admitted to the hospital, discharge time will be determined by your treatment team.   Patients discharged the day of surgery will not be allowed to drive home, and someone needs to stay with them for 24 hours.  NO VISITORS WILL BE ALLOWED IN PRE-OP WHERE PATIENTS GET READY FOR SURGERY.  ONLY 1 SUPPORT PERSON MAY BE PRESENT IN THE WAITING ROOM WHILE YOU ARE IN SURGERY.  IF YOU ARE TO BE ADMITTED, ONCE YOU ARE IN YOUR ROOM YOU WILL BE ALLOWED TWO (2) VISITORS.  Minor children may have two parents present. Special consideration for safety and communication  needs will be reviewed on a case by case basis.   Special instructions:   [facility]- Preparing For Surgery  Before surgery, you can play an important role. Because skin is not sterile, your skin needs to be as free of germs as possible. You can reduce the number of germs on your skin by washing with CHG (chlorahexidine gluconate) Soap before surgery.  CHG is an antiseptic cleaner which kills germs and bonds with the skin to continue killing germs even after washing.    Oral Hygiene is also important to reduce your risk of infection.  Remember - BRUSH YOUR TEETH THE MORNING OF SURGERY WITH YOUR REGULAR TOOTHPASTE  Please do not use if you have an allergy to CHG or antibacterial soaps. If your skin becomes reddened/irritated stop using the CHG.  Do not shave (including legs and underarms) for at least 48 hours prior to first CHG shower. It is OK to shave your face.  Please follow these instructions carefully.   Shower the NIGHT BEFORE SURGERY and the MORNING OF SURGERY  If you chose to wash your hair, wash your hair first as usual with your normal shampoo.  After you shampoo, rinse your hair and body thoroughly to remove the shampoo.  Use CHG Soap as you would any other liquid soap. You can apply CHG directly to the skin and wash gently with a scrungie or a clean washcloth.   Apply the CHG Soap to your body ONLY FROM THE NECK DOWN.  Do not use on open wounds or open sores. Avoid contact with your eyes, ears, mouth and genitals (private parts). Wash Face and genitals (private parts)  with your normal soap.   Wash thoroughly, paying special attention to the area where your surgery will be performed.  Thoroughly rinse your body with warm water from the neck down.  DO NOT shower/wash with your normal soap after using and rinsing off the CHG Soap.  Pat yourself dry with a CLEAN TOWEL.  Wear CLEAN PAJAMAS to bed the night before surgery  Place CLEAN SHEETS on your bed the night before  your surgery  DO NOT SLEEP WITH PETS.   Day of Surgery: Shower with CHG soap. Do not wear jewelry. Do not wear lotions, powders, colognes, or deodorant. Do not shave 48 hours prior to surgery.  Men may shave face and neck. Do not bring valuables to the hospital. [facility] is not responsible for any belongings or valuables. Wear Clean/Comfortable clothing the morning of surgery Remember to brush your teeth WITH YOUR REGULAR TOOTHPASTE.   Please read over the following fact sheets that you were given.   3 days prior to your procedure or After your COVID test   You are not required to quarantine however you are required to wear a well-fitting mask when you are out and around people  not in your household. If your mask becomes wet or soiled, replace with a new one.   Wash your hands often with soap and water for 20 seconds or clean your hands with an alcohol-based hand sanitizer that contains at least 60% alcohol.   Do not share personal items.   Notify your provider:  o if you are in close contact with someone who has COVID  o or if you develop a fever of 100.4 or greater, sneezing, cough, sore throat, shortness of breath or body aches.

## 2021-05-23 NOTE — Progress Notes (Signed)
St Jude rep; Vernona Rieger, made aware of surgery date and time. Time of arrival to be 0900 for rep.   Viviano Simas, RN

## 2021-05-23 NOTE — Progress Notes (Signed)
PERIOPERATIVE PRESCRIPTION FOR IMPLANTED CARDIAC DEVICE PROGRAMMING  Patient Information: Name:  Allen [name]  DOB:  02-Aug-1933  MRN:  [id number]    Planned Procedure:  Insertion of Barostim  Surgeon:  Dr. Coral Else  Date of Procedure:  05/27/21  Cautery will be used.  Position during surgery:  Supine   Please send documentation back to:  [facility] (Fax # [phone])   Device Information:  Clinic EP Physician:  Hillis Range, MD   Device Type:  Defibrillator Manufacturer and Phone #:  [employer]: [phone] Pacemaker Dependent?:  Yes.   Date of Last Device Check:  05/23/21 Normal Device Function?:  Yes.    Electrophysiologist's Recommendations:  Have magnet available. Provide continuous ECG monitoring when magnet is used or reprogramming is to be performed.  Procedure may interfere with device function.  Magnet should be placed over device during procedure.  Per Device Clinic Standing Orders, Lenor Coffin, RN  2:51 PM 05/23/2021

## 2021-05-24 ENCOUNTER — Encounter (HOSPITAL_COMMUNITY): Payer: Self-pay

## 2021-05-24 ENCOUNTER — Encounter (HOSPITAL_COMMUNITY)
Admission: RE | Admit: 2021-05-24 | Discharge: 2021-05-24 | Disposition: A | Discharge status: Home / Self Care | Payer: Medicare PPO | Source: Ambulatory Visit | Attending: Surgery | Admitting: Surgery

## 2021-05-24 ENCOUNTER — Other Ambulatory Visit: Payer: Self-pay

## 2021-05-24 VITALS — BP 143/70 | HR 83 | Temp 97.5°F | Resp 19 | Ht 62.0 in | Wt 173.6 lb

## 2021-05-24 DIAGNOSIS — E785 Hyperlipidemia, unspecified: Secondary | ICD-10-CM | POA: Diagnosis not present

## 2021-05-24 DIAGNOSIS — I2510 Atherosclerotic heart disease of native coronary artery without angina pectoris: Secondary | ICD-10-CM | POA: Diagnosis not present

## 2021-05-24 DIAGNOSIS — Z950 Presence of cardiac pacemaker: Secondary | ICD-10-CM | POA: Diagnosis not present

## 2021-05-24 DIAGNOSIS — Z87891 Personal history of nicotine dependence: Secondary | ICD-10-CM | POA: Insufficient documentation

## 2021-05-24 DIAGNOSIS — N189 Chronic kidney disease, unspecified: Secondary | ICD-10-CM | POA: Insufficient documentation

## 2021-05-24 DIAGNOSIS — I447 Left bundle-branch block, unspecified: Secondary | ICD-10-CM | POA: Insufficient documentation

## 2021-05-24 DIAGNOSIS — I5020 Unspecified systolic (congestive) heart failure: Secondary | ICD-10-CM

## 2021-05-24 DIAGNOSIS — I255 Ischemic cardiomyopathy: Secondary | ICD-10-CM | POA: Insufficient documentation

## 2021-05-24 DIAGNOSIS — E1122 Type 2 diabetes mellitus with diabetic chronic kidney disease: Secondary | ICD-10-CM | POA: Diagnosis not present

## 2021-05-24 DIAGNOSIS — I5022 Chronic systolic (congestive) heart failure: Secondary | ICD-10-CM | POA: Insufficient documentation

## 2021-05-24 DIAGNOSIS — Z794 Long term (current) use of insulin: Secondary | ICD-10-CM | POA: Diagnosis not present

## 2021-05-24 DIAGNOSIS — Z8616 Personal history of COVID-19: Secondary | ICD-10-CM | POA: Diagnosis not present

## 2021-05-24 DIAGNOSIS — Z01812 Encounter for preprocedural laboratory examination: Secondary | ICD-10-CM | POA: Insufficient documentation

## 2021-05-24 DIAGNOSIS — E119 Type 2 diabetes mellitus without complications: Secondary | ICD-10-CM

## 2021-05-24 DIAGNOSIS — Z951 Presence of aortocoronary bypass graft: Secondary | ICD-10-CM | POA: Insufficient documentation

## 2021-05-24 DIAGNOSIS — Z01818 Encounter for other preprocedural examination: Secondary | ICD-10-CM

## 2021-05-24 DIAGNOSIS — I130 Hypertensive heart and chronic kidney disease with heart failure and stage 1 through stage 4 chronic kidney disease, or unspecified chronic kidney disease: Secondary | ICD-10-CM | POA: Insufficient documentation

## 2021-05-24 DIAGNOSIS — Z9981 Dependence on supplemental oxygen: Secondary | ICD-10-CM | POA: Diagnosis not present

## 2021-05-24 HISTORY — DX: Heart failure, unspecified: I50.9

## 2021-05-24 HISTORY — DX: Chronic kidney disease, unspecified: N18.9

## 2021-05-24 LAB — PROTIME-INR
INR: 1 (ref 0.8–1.2)
Prothrombin Time: 12.8 seconds (ref 11.4–15.2)

## 2021-05-24 LAB — COMPREHENSIVE METABOLIC PANEL
ALT: 18 U/L (ref 0–44)
AST: 33 U/L (ref 15–41)
Albumin: 3.7 g/dL (ref 3.5–5.0)
Alkaline Phosphatase: 74 U/L (ref 38–126)
Anion gap: 9 (ref 5–15)
BUN: 21 mg/dL (ref 8–23)
CO2: 28 mmol/L (ref 22–32)
Calcium: 10.9 mg/dL — ABNORMAL HIGH (ref 8.9–10.3)
Chloride: 107 mmol/L (ref 98–111)
Creatinine, Ser: 1.68 mg/dL — ABNORMAL HIGH (ref 0.61–1.24)
GFR, Estimated: 40 mL/min — ABNORMAL LOW (ref >60)
Glucose, Bld: 88 mg/dL (ref 70–99)
Potassium: 5.1 mmol/L (ref 3.5–5.1)
Sodium: 144 mmol/L (ref 135–145)
Total Bilirubin: 1.1 mg/dL (ref 0.3–1.2)
Total Protein: 7 g/dL (ref 6.5–8.1)

## 2021-05-24 LAB — HEMOGLOBIN A1C
Hgb A1c MFr Bld: 7.3 % — ABNORMAL HIGH (ref 4.8–5.6)
Mean Plasma Glucose: 162.81 mg/dL

## 2021-05-24 LAB — CBC
HCT: 57.1 % — ABNORMAL HIGH (ref 39.0–52.0)
Hemoglobin: 17.7 g/dL — ABNORMAL HIGH (ref 13.0–17.0)
MCH: 30.2 pg (ref 26.0–34.0)
MCHC: 31 g/dL (ref 30.0–36.0)
MCV: 97.4 fL (ref 80.0–100.0)
Platelets: 223 10*3/uL (ref 150–400)
RBC: 5.86 MIL/uL — ABNORMAL HIGH (ref 4.22–5.81)
RDW: 15.4 % (ref 11.5–15.5)
WBC: 9.2 10*3/uL (ref 4.0–10.5)
nRBC: 0 % (ref 0.0–0.2)

## 2021-05-24 LAB — URINALYSIS, ROUTINE W REFLEX MICROSCOPIC
Bilirubin Urine: NEGATIVE
Glucose, UA: >=500 mg/dL — AB
Ketones, ur: NEGATIVE mg/dL
Leukocytes,Ua: NEGATIVE
Nitrite: NEGATIVE
Protein, ur: NEGATIVE mg/dL
Specific Gravity, Urine: 1.02 (ref 1.005–1.030)
pH: 6 (ref 5.0–8.0)

## 2021-05-24 LAB — URINALYSIS, MICROSCOPIC (REFLEX): Bacteria, UA: NONE SEEN

## 2021-05-24 LAB — GLUCOSE, CAPILLARY: Glucose-Capillary: 109 mg/dL — ABNORMAL HIGH (ref 70–99)

## 2021-05-24 LAB — SURGICAL PCR SCREEN
MRSA, PCR: NEGATIVE
Staphylococcus aureus: NEGATIVE

## 2021-05-24 LAB — TYPE AND SCREEN
ABO/RH(D): O POS
Antibody Screen: NEGATIVE

## 2021-05-24 LAB — APTT: aPTT: 28 seconds (ref 24–36)

## 2021-05-24 NOTE — Progress Notes (Signed)
PCP - Dr. Lia Hopping Cardiologist - Dr. Terri Skains EP: Dr. Johney Frame Pulm: Dr. Jerilee Hoh  PPM/ICD - [employer] Device Orders - Device Information:   Clinic EP Physician:  Hillis Range, MD    Device Type:  Defibrillator Manufacturer and Phone #:  [employer]: [phone] Pacemaker Dependent?:  Yes.   Date of Last Device Check:  05/23/21         Normal Device Function?:  Yes.     Electrophysiologist's Recommendations:   Have magnet available. Provide continuous ECG monitoring when magnet is used or reprogramming is to be performed.  Procedure may interfere with device function.  Magnet should be placed over device during procedure. Rep Notified - Vernona Rieger, notified on 05/23/21 of pt's surgery date and time. ETA for Rep is 0900 dos.   Chest x-ray - n/a EKG - 10/13/20 Stress Test - 06/17/18 ECHO - 07/20/20 Cardiac Cath -2017   Sleep Study - denies CPAP - denies  CBG in PAT 109, will collect A1C today. Fasting Blood Sugar - 100-120 Checks Blood Sugar 1 time a day  Blood Thinner Instructions: Plavix, LD 05/20/21 Aspirin Instructions: Cont per VVS protocol  NPO  COVID TEST- not indicated. Ambulatory surgery  Anesthesia review: Yes, cardiac history  Patient denies shortness of breath, fever, cough and chest pain at PAT appointment   All instructions explained to the patient, with a verbal understanding of the material. Patient agrees to go over the instructions while at home for a better understanding. Patient also instructed to self quarantine after being tested for COVID-19. The opportunity to ask questions was provided.

## 2021-05-25 ENCOUNTER — Encounter (HOSPITAL_COMMUNITY): Payer: Self-pay

## 2021-05-25 NOTE — Progress Notes (Signed)
Anesthesia Chart Review:   Case: [id number] Date/Time: 05/27/21 0915   Procedure: INSERTION OF Alexia Freestone (Right)   Anesthesia type: General   Pre-op diagnosis: CHF   [facility]OM [facility] / [facility]   Surgeons: Nada Libman, MD       DISCUSSION: Patient is an 87 year old male scheduled for the above procedure.  History includes former smoker (quit 06/13/11), HTN, HLD, CAD (s/p CABG: LIMA-LAD, SVG-D1, SVG-RCA, SVG-OM1-OM2 05/08/16), ischemic cardiomyopathy, chronic systolic CHF, BiV ICD (St. Jude CRT-D 07/17/18 for ICM, LBBB, bradycardia), COVID PNA (~06/2019; started on home O2).  Mild to moderate AI and AS on 07/2020 echo.   Cardiac Device Perioperative Recommendations: Device Type:  Biochemist, clinical and Phone #:  St. Jude/Abbott: [phone] Pacemaker Dependent?:  Yes.   Date of Last Device Check:  05/23/21         Normal Device Function?:  Yes.     Electrophysiologist's Recommendations: Have magnet available. Provide continuous ECG monitoring when magnet is used or reprogramming is to be performed.  Procedure may interfere with device function.  Magnet should be placed over device during procedure. Rep Notified surgery date and time. ETA for Rep is 0900 DOS.Marland Kitchen   Per the [facility], he can continue aspirin but hold Plavix 5 days prior to procedure.   VS: BP (!) 143/70    Pulse 83    Temp (!) 36.4 C (Oral)    Resp 19    Ht 5\' 2"  (1.575 m)    Wt 78.7 kg    SpO2 93%    BMI 31.75 kg/m    PROVIDERS: , MD is PCP [name], MD is cardiologist [name], MD is EP cardiologist Hillis Range, MD is pulmonologist   LABS: Labs reviewed: Acceptable for surgery. Cr 1.68, but consistent with previous labs.  (all labs ordered are listed, but only abnormal results are displayed)  Labs Reviewed  GLUCOSE, CAPILLARY - Abnormal; Notable for the following components:      Result Value   Glucose-Capillary 109 (*)    All other components within normal limits  CBC -  Abnormal; Notable for the following components:   RBC 5.86 (*)    Hemoglobin 17.7 (*)    HCT 57.1 (*)    All other components within normal limits  COMPREHENSIVE METABOLIC PANEL - Abnormal; Notable for the following components:   Creatinine, Ser 1.68 (*)    Calcium 10.9 (*)    GFR, Estimated 40 (*)    All other components within normal limits  URINALYSIS, ROUTINE W REFLEX MICROSCOPIC - Abnormal; Notable for the following components:   Glucose, UA >=500 (*)    Hgb urine dipstick TRACE (*)    All other components within normal limits  HEMOGLOBIN A1C - Abnormal; Notable for the following components:   Hgb A1c MFr Bld 7.3 (*)    All other components within normal limits  SURGICAL PCR SCREEN  PROTIME-INR  APTT  URINALYSIS, MICROSCOPIC (REFLEX)  TYPE AND SCREEN    OTHER: As outlined by Dr. Sandrea Hughs: Walk Test 12/06/20   Walked RA  approx   200 ft  @ slow pace  stopped due to desat to 88% with sob, rx  3lpm POC   And walked another 100 ft  With less sob   PFTs 02/03/20: FEV1 1.92 (113 % ) ratio 0.91  p no % improvement from saba p nothing prior to study with DLCO  8.42 (45%) corrects to 2.56 (64%)  for alv volume and FV  curve s concavity     EKG: 10/13/20: Sinus rhythm, first-degree AV block, BiV pacing   CV: US Carotid 04/11/21: Summary:  - Right Carotid: Velocities in the right ICA are consistent with a 1-39%  stenosis.  - Left Carotid: Velocities in the left ICA are consistent with a 1-39%  stenosis.  - Vertebrals:  Bilateral vertebral arteries demonstrate antegrade flow.  - Subclavians: Normal flow hemodynamics were seen in bilateral subclavian arteries.   Echo 07/20/20: IMPRESSIONS   1. Left ventricular ejection fraction, by estimation, is approximately  35%. The left ventricle has moderately decreased function. The left  ventricle demonstrates global hypokinesis. The left ventricular internal  cavity size was mildly dilated. There is  moderate left ventricular hypertrophy. Left  ventricular diastolic  parameters are indeterminate. Abnormal global longitudinal strain of  -9.3%.   2. Right ventricular systolic function is mildly reduced. The right  ventricular size is normal. A device wire is visualized. There is normal  pulmonary artery systolic pressure. The estimated right ventricular  systolic pressure is 35.3 mmHg.   3. Left atrial size was mild to moderately dilated.   4. The mitral valve is grossly normal. Mild mitral valve regurgitation.   5. The aortic valve is tricuspid. There is moderate calcification of the  aortic valve. Aortic valve regurgitation is mild to moderate. Mild to  moderate aortic valve stenosis. Aortic valve mean gradient measures 11.3  mmHg. Dimentionless index 0.41.   6. Aortic dilatation noted. There is mild dilatation of the aortic root,  measuring 40 mm.   7. The inferior vena cava is normal in size with greater than 50%  respiratory variability, suggesting right atrial pressure of 3 mmHg.  - Comparison(s): Echocardiogram done 09/25/19 showed an EF of 35% with mild  to moderate AS and an AV Peak Grad of 21 mmHg.   Nuclear stress test 06/17/18: There was no ST segment deviation noted during stress. Findings consistent with anterior and basal inferior prior myocardial infarctions. No current ischemia. This is a high risk study. Risk based on decreased LVEF, no current myocardium at jeopardy. Recommend correlating LVEF with echo. The left ventricular ejection fraction is severely decreased (<30%).  Last cardiac cath (05/03/16) was pre-CABG.   Past Medical History:  Diagnosis Date   AICD (automatic cardioverter/defibrillator) present    Biventricular ICD (implantable cardioverter-defibrillator) in place 07/17/2018   CAD (coronary artery disease)    a. 04/2016: s/p CABG with LIMA-LAD, SVG-D1, SVG-RCA, and seq SVG-OM1-OM2   CHF (congestive heart failure) (HCC)    CKD (chronic kidney disease)    Essential hypertension     Hyperlipidemia    Ischemic cardiomyopathy    LVEF 35% May 2016   LBBB (left bundle branch block)    Pneumonia due to COVID-19 virus    Sinus bradycardia    Type 2 diabetes mellitus (HCC)     Past Surgical History:  Procedure Laterality Date   BIV ICD INSERTION CRT-D  07/17/2018   BIV ICD INSERTION CRT-D N/A 07/17/2018   Procedure: BIV ICD INSERTION CRT-D;  Surgeon: Hillis Range, MD;  [facility]: [facility] INVASIVE CV LAB;  Service: Cardiovascular;  Laterality: N/A;   CARDIAC CATHETERIZATION N/A 11/02/2014   Procedure: Left Heart Cath and Coronary Angiography;  Surgeon: Lyn Records, MD;  Location[facility] INVASIVE CV LAB;  Service: Cardiovascular;  Laterality: N/A;   CARDIAC CATHETERIZATION N/A 05/03/2016   Procedure: Left Heart Cath and Coronary Angiography;  Surgeon: Peter M Swaziland, MD;  Location: [facility] INVASIVE CV LAB;  Service: Cardiovascular;  Laterality: N/A;   CORONARY ARTERY BYPASS GRAFT N/A 05/08/2016   Procedure: CORONARY ARTERY BYPASS GRAFTING (CABG) x 5;  Surgeon: Alleen Borne, MD;  Location: [facility];  Service: Open Heart Surgery;  Laterality: N/A;   ENDOVEIN HARVEST OF GREATER SAPHENOUS VEIN  05/08/2016   Procedure: ENDOVEIN HARVEST OF GREATER SAPHENOUS VEIN;  Surgeon: Alleen Borne, MD;  Location: [facility];  Service: Open Heart Surgery;;   TEE WITHOUT CARDIOVERSION N/A 05/08/2016   Procedure: TRANSESOPHAGEAL ECHOCARDIOGRAM (TEE);  Surgeon: Alleen Borne, MD;  Location: [facility] OR;  Service: Open Heart Surgery;  Laterality: N/A;    MEDICATIONS:  aspirin 81 MG EC tablet   atorvastatin (LIPITOR) 80 MG tablet   carvedilol (COREG) 6.25 MG tablet   clopidogrel (PLAVIX) 75 MG tablet   empagliflozin (JARDIANCE) 10 MG TABS tablet   furosemide (LASIX) 20 MG tablet   Insulin Glargine (LANTUS SOLOSTAR) 100 UNIT/ML Solostar Pen   mirtazapine (REMERON) 15 MG tablet   nitroGLYCERIN (NITROSTAT) 0.4 MG SL tablet   OXYGEN   repaglinide (PRANDIN) 2 MG tablet   sacubitril-valsartan (ENTRESTO) 49-51 MG    vitamin B-12 (CYANOCOBALAMIN) 1000 MCG tablet   No current facility-administered medications for this encounter.    Shonna Chock, PA-C Surgical Short Stay/Anesthesiology [facility] Phone [phone] [facility] Phone [phone] 05/25/2021 3:22 PM

## 2021-05-25 NOTE — Progress Notes (Signed)
EPIC Encounter for ICM Monitoring  Patient Name: Allen [name] is a 87 y.o. male Date: 05/25/2021 Primary Care Physican: [name], MD Primary Cardiologist: Diona Browner Electrophysiologist: Allred Bi-V Pacing: 89%     04/22/2021 Weight:  177 lbs   AT/AF Burden <1%           Spoke with patient and heart failure questions reviewed.  Pt asymptomatic for fluid accumulation and feeling well.  Barostim procedure scheduled 12/16.        CorVue thoracic impedance suggesting normal fluid levels.   Prescribed:  Furosemide 20 mg Take 1 tablet by mouth daily as needed. Weight gain of 2-3 lbs in a 24 hour period or 5 lbs in one week.  Pt reports 02/28/21 he is taking Furosemide daily instead of PRN.   Labs: 01/13/2021 Creatinine 1.64, BUN 16, Potassium 4.8, Sodium 145 12/29/2020 Creatinine 2.02, BUN 24, Potassium 4.7, Sodium 142, GFR 32  10/13/2020 Creatinine 1.61, BUN 16, Potassium 4.8, Sodium 144 07/08/2020 Creatinine 1.46, BUN 20, Potassium 4.7, Sodium 142, GFR 44-50  A complete set of results can be found in Results Review.   Recommendations: No changes and encouraged to call if experiencing any fluid symptoms.   Follow-up plan: ICM clinic phone appointment on 06/27/2021.   91 day device clinic remote transmission 07/18/2021.     EP/Cardiology Office Visits: 06/27/2021 with Dr. Diona Browner.  Recall 07/12/2021 with [name], PA.     Copy of ICM check sent to Dr. Johney Frame.    3 month ICM trend: 05/23/2021.    12-14 Month ICM trend:       [name], RN 05/25/2021 2:30 PM

## 2021-05-25 NOTE — Anesthesia Preprocedure Evaluation (Addendum)
Anesthesia Evaluation  Patient identified by MRN, date of birth, ID band Patient awake    Reviewed: Allergy & Precautions, NPO status , Patient's Chart, lab work & pertinent test results, reviewed documented beta blocker date and time   Airway Mallampati: II  TM Distance: >3 FB Neck ROM: Full    Dental  (+) Dental Advisory Given, Partial Lower, Partial Upper, Caps   Pulmonary sleep apnea , pneumonia, resolved, former smoker,  Covid 2020, hospitalized for 2 weeks, uses home O2 and oxygen concentrator   Pulmonary exam normal breath sounds clear to auscultation       Cardiovascular hypertension, Pt. on medications and Pt. on home beta blockers + CAD, + Past MI, + CABG, +CHF and + DOE  Normal cardiovascular exam+ dysrhythmias Atrial Fibrillation + Cardiac Defibrillator + Valvular Problems/Murmurs MR and AI  Rhythm:Regular Rate:Bradycardia  S/P CABG x 5, 05/08/2016 LIMA-LAD, SVG-D1, SVG-RCA, and seq SVG-OM1-OM   Echo 07/20/2020 1. Left ventricular ejection fraction, by estimation, is approximately 35%. The left ventricle has moderately decreased function. The left ventricle demonstrates global hypokinesis. The left ventricular internal cavity size was mildly dilated. There is moderate left ventricular hypertrophy. Left ventricular diastolic parameters are indeterminate. Abnormal global longitudinal strain of -9.3%.  2. Right ventricular systolic function is mildly reduced. The right ventricular size is normal. A device wire is visualized. There is normal pulmonary artery systolic pressure. The estimated right ventricular systolic pressure is 35.3 mmHg.  3. Left atrial size was mild to moderately dilated.  4. The mitral valve is grossly normal. Mild mitral valve regurgitation.  5. The aortic valve is tricuspid. There is moderate calcification of the aortic valve. Aortic valve regurgitation is mild to moderate. Mild to moderate aortic valve  stenosis. Aortic valve mean gradient measures 11.3 mmHg. Dimentionless index 0.41.  6. Aortic dilatation noted. There is mild dilatation of the aortic root, measuring 40 mm.  7. The inferior vena cava is normal in size with greater than 50% respiratory variability, suggesting right atrial pressure of 3 mmHg.   EKG Biventricular paced rhythm   Neuro/Psych negative neurological ROS  negative psych ROS   GI/Hepatic negative GI ROS, Neg liver ROS,   Endo/Other  diabetes, Well Controlled, Type 2, Oral Hypoglycemic Agents, Insulin DependentObesity Hyperlipidemia  Renal/GU Renal InsufficiencyRenal disease  negative genitourinary   Musculoskeletal negative musculoskeletal ROS (+)   Abdominal (+) + obese,   Peds  Hematology Plavix therapy- last dose 5 days ago   Anesthesia Other Findings   Reproductive/Obstetrics                          Anesthesia Physical Anesthesia Plan  ASA: 3  Anesthesia Plan: General   Post-op Pain Management:    Induction:   PONV Risk Score and Plan: 3 and Treatment may vary due to age or medical condition, Ondansetron and TIVA  Airway Management Planned: Oral ETT  Additional Equipment: Arterial line  Intra-op Plan:   Post-operative Plan: Extubation in OR  Informed Consent: I have reviewed the patients History and Physical, chart, labs and discussed the procedure including the risks, benefits and alternatives for the proposed anesthesia with the patient or authorized representative who has indicated his/her understanding and acceptance.     Dental advisory given  Plan Discussed with: Anesthesiologist and CRNA  Anesthesia Plan Comments: (PAT note written 05/25/2021 by Shonna Chock, PA-C. )       Anesthesia Quick Evaluation

## 2021-05-27 ENCOUNTER — Ambulatory Visit (HOSPITAL_COMMUNITY): Payer: Medicare PPO | Admitting: Vascular Surgery

## 2021-05-27 ENCOUNTER — Telehealth: Payer: Self-pay

## 2021-05-27 ENCOUNTER — Encounter (HOSPITAL_COMMUNITY)
Admission: RE | Disposition: A | Discharge status: Home / Self Care | Payer: Self-pay | Source: Home / Self Care | Attending: Surgery

## 2021-05-27 ENCOUNTER — Ambulatory Visit (HOSPITAL_COMMUNITY): Payer: Medicare PPO | Admitting: Anesthesiology

## 2021-05-27 ENCOUNTER — Encounter (HOSPITAL_COMMUNITY): Payer: Self-pay | Admitting: Surgery

## 2021-05-27 ENCOUNTER — Ambulatory Visit (HOSPITAL_COMMUNITY)
Admission: RE | Admit: 2021-05-27 | Discharge: 2021-05-27 | Disposition: A | Discharge status: Home / Self Care | Payer: Medicare PPO | Attending: Surgery | Admitting: Surgery

## 2021-05-27 DIAGNOSIS — E1122 Type 2 diabetes mellitus with diabetic chronic kidney disease: Secondary | ICD-10-CM | POA: Diagnosis not present

## 2021-05-27 DIAGNOSIS — Z87891 Personal history of nicotine dependence: Secondary | ICD-10-CM | POA: Diagnosis not present

## 2021-05-27 DIAGNOSIS — I110 Hypertensive heart disease with heart failure: Secondary | ICD-10-CM | POA: Insufficient documentation

## 2021-05-27 DIAGNOSIS — E119 Type 2 diabetes mellitus without complications: Secondary | ICD-10-CM | POA: Insufficient documentation

## 2021-05-27 DIAGNOSIS — Z794 Long term (current) use of insulin: Secondary | ICD-10-CM | POA: Diagnosis not present

## 2021-05-27 DIAGNOSIS — I130 Hypertensive heart and chronic kidney disease with heart failure and stage 1 through stage 4 chronic kidney disease, or unspecified chronic kidney disease: Secondary | ICD-10-CM | POA: Diagnosis not present

## 2021-05-27 DIAGNOSIS — I509 Heart failure, unspecified: Secondary | ICD-10-CM | POA: Insufficient documentation

## 2021-05-27 DIAGNOSIS — E669 Obesity, unspecified: Secondary | ICD-10-CM | POA: Insufficient documentation

## 2021-05-27 DIAGNOSIS — E785 Hyperlipidemia, unspecified: Secondary | ICD-10-CM | POA: Insufficient documentation

## 2021-05-27 DIAGNOSIS — Z951 Presence of aortocoronary bypass graft: Secondary | ICD-10-CM | POA: Diagnosis not present

## 2021-05-27 DIAGNOSIS — I080 Rheumatic disorders of both mitral and aortic valves: Secondary | ICD-10-CM | POA: Diagnosis not present

## 2021-05-27 DIAGNOSIS — Z6831 Body mass index (BMI) 31.0-31.9, adult: Secondary | ICD-10-CM | POA: Insufficient documentation

## 2021-05-27 DIAGNOSIS — Z7984 Long term (current) use of oral hypoglycemic drugs: Secondary | ICD-10-CM | POA: Diagnosis not present

## 2021-05-27 DIAGNOSIS — Z9981 Dependence on supplemental oxygen: Secondary | ICD-10-CM | POA: Insufficient documentation

## 2021-05-27 DIAGNOSIS — Z006 Encounter for examination for normal comparison and control in clinical research program: Secondary | ICD-10-CM | POA: Diagnosis not present

## 2021-05-27 DIAGNOSIS — I2510 Atherosclerotic heart disease of native coronary artery without angina pectoris: Secondary | ICD-10-CM | POA: Insufficient documentation

## 2021-05-27 DIAGNOSIS — E7800 Pure hypercholesterolemia, unspecified: Secondary | ICD-10-CM | POA: Diagnosis not present

## 2021-05-27 DIAGNOSIS — I5022 Chronic systolic (congestive) heart failure: Secondary | ICD-10-CM | POA: Diagnosis not present

## 2021-05-27 DIAGNOSIS — N189 Chronic kidney disease, unspecified: Secondary | ICD-10-CM | POA: Diagnosis not present

## 2021-05-27 DIAGNOSIS — Z95810 Presence of automatic (implantable) cardiac defibrillator: Secondary | ICD-10-CM | POA: Diagnosis not present

## 2021-05-27 LAB — GLUCOSE, CAPILLARY
Glucose-Capillary: 92 mg/dL (ref 70–99)
Glucose-Capillary: 108 mg/dL — ABNORMAL HIGH (ref 70–99)

## 2021-05-27 SURGERY — INSERTION, CAROTID SINUS BAROREFLEX ACTIVATION DEVICE
Anesthesia: General | Site: Chest | Laterality: Right

## 2021-05-27 MED ORDER — ORAL CARE MOUTH RINSE: 15.0000 mL | Freq: Once | OROMUCOSAL | Status: AC

## 2021-05-27 MED ORDER — FENTANYL CITRATE (PF) 100 MCG/2ML IJ SOLN
25.0000 ug | INTRAMUSCULAR | Status: DC | PRN
  Administered 2021-05-27: 25 ug via INTRAVENOUS

## 2021-05-27 MED ORDER — DEXAMETHASONE SODIUM PHOSPHATE 10 MG/ML IJ SOLN
INTRAMUSCULAR | Status: AC
  Filled 2021-05-27: qty 1

## 2021-05-27 MED ORDER — ONDANSETRON HCL 4 MG/2ML IJ SOLN
INTRAMUSCULAR | Status: AC
  Filled 2021-05-27: qty 2

## 2021-05-27 MED ORDER — SODIUM CHLORIDE 0.9 % IV SOLN
INTRAVENOUS | Status: DC | PRN
  Administered 2021-05-27: 500 mL

## 2021-05-27 MED ORDER — OXYCODONE HCL 5 MG PO TABS
5.0000 mg | ORAL_TABLET | Freq: Once | ORAL | Status: AC
  Administered 2021-05-27: 5 mg via ORAL

## 2021-05-27 MED ORDER — FENTANYL CITRATE (PF) 250 MCG/5ML IJ SOLN
INTRAMUSCULAR | Status: AC
  Filled 2021-05-27: qty 5

## 2021-05-27 MED ORDER — LIDOCAINE 2% (20 MG/ML) 5 ML SYRINGE
INTRAMUSCULAR | Status: AC
  Filled 2021-05-27: qty 5

## 2021-05-27 MED ORDER — OXYCODONE-ACETAMINOPHEN 5-325 MG PO TABS: 1.0000 | ORAL_TABLET | Freq: Four times a day (QID) | ORAL | 0 refills | Status: DC | PRN

## 2021-05-27 MED ORDER — FENTANYL CITRATE (PF) 100 MCG/2ML IJ SOLN
INTRAMUSCULAR | Status: AC
  Filled 2021-05-27: qty 2

## 2021-05-27 MED ORDER — FENTANYL CITRATE (PF) 250 MCG/5ML IJ SOLN
INTRAMUSCULAR | Status: DC | PRN
  Administered 2021-05-27: 100 ug via INTRAVENOUS

## 2021-05-27 MED ORDER — CEFAZOLIN SODIUM-DEXTROSE 2-4 GM/100ML-% IV SOLN
INTRAVENOUS | Status: AC
  Filled 2021-05-27: qty 100

## 2021-05-27 MED ORDER — SODIUM CHLORIDE 0.9 % IV SOLN: INTRAVENOUS | Status: DC

## 2021-05-27 MED ORDER — CHLORHEXIDINE GLUCONATE 0.12 % MT SOLN
15.0000 mL | Freq: Once | OROMUCOSAL | Status: AC
  Administered 2021-05-27: 15 mL via OROMUCOSAL
  Filled 2021-05-27: qty 15

## 2021-05-27 MED ORDER — LACTATED RINGERS IV SOLN: INTRAVENOUS | Status: DC

## 2021-05-27 MED ORDER — ETOMIDATE 2 MG/ML IV SOLN
INTRAVENOUS | Status: AC
  Filled 2021-05-27: qty 10

## 2021-05-27 MED ORDER — MIDAZOLAM HCL 2 MG/2ML IJ SOLN
INTRAMUSCULAR | Status: AC
  Filled 2021-05-27: qty 2

## 2021-05-27 MED ORDER — NOREPINEPHRINE 4 MG/250ML-% IV SOLN
INTRAVENOUS | Status: DC | PRN
  Administered 2021-05-27: 2 ug/min via INTRAVENOUS
  Administered 2021-05-27: 4 ug/min via INTRAVENOUS

## 2021-05-27 MED ORDER — ROCURONIUM BROMIDE 10 MG/ML (PF) SYRINGE
PREFILLED_SYRINGE | INTRAVENOUS | Status: AC
  Filled 2021-05-27: qty 10

## 2021-05-27 MED ORDER — 0.9 % SODIUM CHLORIDE (POUR BTL) OPTIME
TOPICAL | Status: DC | PRN
  Administered 2021-05-27: 1000 mL

## 2021-05-27 MED ORDER — DEXAMETHASONE SODIUM PHOSPHATE 10 MG/ML IJ SOLN
INTRAMUSCULAR | Status: DC | PRN
  Administered 2021-05-27: 4 mg via INTRAVENOUS

## 2021-05-27 MED ORDER — OXYCODONE HCL 5 MG PO TABS
ORAL_TABLET | ORAL | Status: AC
  Filled 2021-05-27: qty 1

## 2021-05-27 MED ORDER — ETOMIDATE 2 MG/ML IV SOLN
INTRAVENOUS | Status: DC | PRN
  Administered 2021-05-27: 16 mg via INTRAVENOUS

## 2021-05-27 MED ORDER — SODIUM CHLORIDE 0.9 % IV SOLN
0.1500 ug/kg/min | INTRAVENOUS | Status: AC
  Administered 2021-05-27: .1 ug/kg/min via INTRAVENOUS
  Filled 2021-05-27: qty 2000

## 2021-05-27 MED ORDER — ONDANSETRON HCL 4 MG/2ML IJ SOLN: 4.0000 mg | Freq: Once | INTRAMUSCULAR | Status: DC | PRN

## 2021-05-27 MED ORDER — ONDANSETRON HCL 4 MG/2ML IJ SOLN
INTRAMUSCULAR | Status: DC | PRN
  Administered 2021-05-27: 4 mg via INTRAVENOUS

## 2021-05-27 MED ORDER — CEFAZOLIN SODIUM-DEXTROSE 2-4 GM/100ML-% IV SOLN
2.0000 g | INTRAVENOUS | Status: AC
  Administered 2021-05-27: 2 g via INTRAVENOUS
  Filled 2021-05-27: qty 100

## 2021-05-27 MED ORDER — CHLORHEXIDINE GLUCONATE CLOTH 2 % EX PADS: 6.0000 | MEDICATED_PAD | Freq: Once | CUTANEOUS | Status: DC

## 2021-05-27 MED ORDER — PHENYLEPHRINE 40 MCG/ML (10ML) SYRINGE FOR IV PUSH (FOR BLOOD PRESSURE SUPPORT)
PREFILLED_SYRINGE | INTRAVENOUS | Status: AC
  Filled 2021-05-27: qty 10

## 2021-05-27 MED ORDER — SUGAMMADEX SODIUM 200 MG/2ML IV SOLN
INTRAVENOUS | Status: DC | PRN
  Administered 2021-05-27: 200 mg via INTRAVENOUS

## 2021-05-27 MED ORDER — PROPOFOL 10 MG/ML IV BOLUS
INTRAVENOUS | Status: AC
  Filled 2021-05-27: qty 20

## 2021-05-27 MED ORDER — MIDAZOLAM HCL 2 MG/2ML IJ SOLN
INTRAMUSCULAR | Status: DC | PRN
  Administered 2021-05-27: 1 mg via INTRAVENOUS

## 2021-05-27 MED ORDER — LIDOCAINE 2% (20 MG/ML) 5 ML SYRINGE
INTRAMUSCULAR | Status: DC | PRN
  Administered 2021-05-27: 40 mg via INTRAVENOUS

## 2021-05-27 MED ORDER — ROCURONIUM BROMIDE 10 MG/ML (PF) SYRINGE
PREFILLED_SYRINGE | INTRAVENOUS | Status: DC | PRN
  Administered 2021-05-27 (×2): 20 mg via INTRAVENOUS
  Administered 2021-05-27: 40 mg via INTRAVENOUS

## 2021-05-27 SURGICAL SUPPLY — 40 items
BAG COUNTER SPONGE SURGICOUNT (BAG) ×2 IMPLANT
BAG SURGICOUNT SPONGE COUNTING (BAG) ×1
CANISTER SUCT 3000ML PPV (MISCELLANEOUS) ×3 IMPLANT
CHLORAPREP W/TINT 26 (MISCELLANEOUS) ×3 IMPLANT
CLIP VESOCCLUDE MED 6/CT (CLIP) ×2 IMPLANT
CLIP VESOCCLUDE SM WIDE 6/CT (CLIP) ×2 IMPLANT
DERMABOND ADVANCED (GAUZE/BANDAGES/DRESSINGS) ×2
DERMABOND ADVANCED .7 DNX12 (GAUZE/BANDAGES/DRESSINGS) ×1 IMPLANT
ELECT REM PT RETURN 9FT ADLT (ELECTROSURGICAL) ×3
ELECTRODE REM PT RTRN 9FT ADLT (ELECTROSURGICAL) ×1 IMPLANT
GENERATOR IPG BAROSTIM 2104 (Generator) ×2 IMPLANT
GLOVE SRG 8 PF TXTR STRL LF DI (GLOVE) ×1 IMPLANT
GLOVE SURG POLYISO LF SZ7.5 (GLOVE) ×3 IMPLANT
GLOVE SURG UNDER POLY LF SZ8 (GLOVE) ×2
GOWN STRL REUS W/ TWL LRG LVL3 (GOWN DISPOSABLE) ×3 IMPLANT
GOWN STRL REUS W/ TWL XL LVL3 (GOWN DISPOSABLE) ×1 IMPLANT
GOWN STRL REUS W/TWL LRG LVL3 (GOWN DISPOSABLE) ×6
GOWN STRL REUS W/TWL XL LVL3 (GOWN DISPOSABLE) ×2
HEMOSTAT SNOW SURGICEL 2X4 (HEMOSTASIS) IMPLANT
KIT BASIN OR (CUSTOM PROCEDURE TRAY) ×3 IMPLANT
KIT TURNOVER KIT B (KITS) ×3 IMPLANT
LEAD CAROTID BAROSTIM 1036 (Lead) ×2 IMPLANT
MARKER SKIN DUAL TIP RULER LAB (MISCELLANEOUS) ×3 IMPLANT
NDL 18GX1X1/2 (RX/OR ONLY) (NEEDLE) ×1 IMPLANT
NEEDLE 18GX1X1/2 (RX/OR ONLY) (NEEDLE) ×3 IMPLANT
NS IRRIG 1000ML POUR BTL (IV SOLUTION) ×6 IMPLANT
PACK CAROTID (CUSTOM PROCEDURE TRAY) ×3 IMPLANT
PAD ARMBOARD 7.5X6 YLW CONV (MISCELLANEOUS) ×6 IMPLANT
POSITIONER HEAD DONUT 9IN (MISCELLANEOUS) ×3 IMPLANT
SUT ETHIBOND CT1 BRD #0 30IN (SUTURE) ×6 IMPLANT
SUT ETHILON 3 0 PS 1 (SUTURE) IMPLANT
SUT PROLENE 6 0 BV (SUTURE) ×24 IMPLANT
SUT SILK 0 FSL (SUTURE) IMPLANT
SUT VIC AB 3-0 SH 27 (SUTURE) ×4
SUT VIC AB 3-0 SH 27X BRD (SUTURE) ×2 IMPLANT
SUT VICRYL 4-0 PS2 18IN ABS (SUTURE) ×6 IMPLANT
SYR 5ML LL (SYRINGE) ×3 IMPLANT
SYR BULB IRRIG 60ML STRL (SYRINGE) ×3 IMPLANT
TOWEL GREEN STERILE (TOWEL DISPOSABLE) ×3 IMPLANT
WATER STERILE IRR 1000ML POUR (IV SOLUTION) ×3 IMPLANT

## 2021-05-27 NOTE — Op Note (Signed)
° ° °  Patient name: Allen [name] MRN: [id number] DOB: 05/16/1934 Sex: male  05/27/2021 Pre-operative Diagnosis: NYHA class III heart failure Post-operative diagnosis:  Same Surgeon:  Durene Cal Assistants:  Clinton Gallant, PA Procedure:   Braostim Neo device implant Anesthesia:  General Blood Loss:  minimal Specimens:  none  Findings:  device palced at position A  IPG SN:  [id number]  model:  [id number] Lead SN:  [id number]  mode;:  1036  Indications: This is an 87 year old gentleman with NYHA class III heart failure who is on goal-directed medical therapy with significant symptoms.  He has been approved for a Barostim Neo device.  He comes in for the procedure.  I again went over the risks and benefits of the operation with him and he wished to proceed.  Procedure:  The patient was identified in the holding area and taken to [facility] OR ROOM 16  The patient was then placed supine on the table. general anesthesia was administered.  The patient was prepped and draped in the usual sterile fashion.  A time out was called and antibiotics were administered.  A PA was necessary to expedite procedure and assist with technical details.  Ultrasound was used to identify the carotid bifurcation.  This was marked on the skin surface.  Next a 3 cm transverse incision was made in the right neck.  Cautery was used divide subcutaneous tissue and platysma muscle.  Identified the medial edge of the sternocleidomastoid and reflected the muscle laterally.  Dissection was then carried down to the anterior surface of the common carotid artery.  I then proceeded with cephalad dissection until I identified the carotid bifurcation.  I made sure not to disturb the tissue around the bifurcation.  Next attention was turned towards the upper chest.  A transverse incision was made 1 fingerbreadth below the clavicle.  Cautery was used to divide subcutaneous tissue down to the pectoral fascia.  I then created a pocket on top of the  pectoral fascia.  Next, a tonsil clamp was used to create a tunnel between the 2 incisions.  I then brought the lead through the tunnel.  It was connected to the IPG.  The IPG was placed into the pocket.  I then placed the lead on position a and began device testing.  We tested multiple sites, and got a small response which was best at position a.  We elected to secure the electrode at position a with six 6-0 Prolene sutures.  Next, the strain relief loop was sutured to the anterior surface of the common carotid artery with two 6-0 Prolene sutures.  The device was secured to the pectoral fascia with two 2-0 Ethibond sutures.  The device was then placed into the pocket.  The wounds were irrigated with antibiotic impregnated solution.  The neck incision was closed by reapproximating the platysma muscle with 3-0 Vicryl and a subcuticular closure.  The chest incision was closed by reapproximating subcutaneous tissue with 3-0 Vicryl and subcutaneous closure.  Dermabond was placed on the incisions.  Patient was successfully extubated from anesthesia and taken recovery in stable condition.   Disposition: To PACU stable.   Juleen China, M.D., [facility] [place] Office: [phone] Pager:  [phone]

## 2021-05-27 NOTE — Progress Notes (Signed)
AICD interrogated and turned back on in OR by rep. Patient received in PACU with AICD interrogation report. CRNA stated that AICD placed back on in OR.

## 2021-05-27 NOTE — H&P (Signed)
[facility] [place]   Patient name: Allen [name]           MRN: [id number]        DOB: 07-14-1933            Sex: male     REQUESTING PROVIDER:     [name]     REASON FOR CONSULT:    Barostim evaluation   HISTORY OF PRESENT ILLNESS:    Allen [name] is a 87 y.o. male, who is referred for Barostim evaluation his most recent echocardiogram showed an ejection fraction of 35% in February 2022.  He is on oxygen periodically at home.  According to him he gets around fairly well but is limited by his shortness of breath which is cardiac in origin     The patient had a ICD placed in 2020.  He has a history of coronary artery disease, status post CABG in 2017.  He is medically managed for hypertension.  He is on a statin for hypercholesterolemia.  He is a former smoker.  He is medically managed for type 2 diabetes. PAST MEDICAL HISTORY          Past Medical History:  Diagnosis Date   Biventricular ICD (implantable cardioverter-defibrillator) in place 07/17/2018   CAD (coronary artery disease)      a. 04/2016: s/p CABG with LIMA-LAD, SVG-D1, SVG-RCA, and seq SVG-OM1-OM2   Essential hypertension     Hyperlipidemia     Ischemic cardiomyopathy      LVEF 35% May 2016   LBBB (left bundle branch block)     Pneumonia due to COVID-19 virus     Sinus bradycardia     Type 2 diabetes mellitus (HCC)          FAMILY HISTORY         Family History  Problem Relation Age of Onset   Unexplained death Father     Unexplained death Mother          Old age   Coronary artery disease Brother 63      SOCIAL HISTORY:    Social History         Socioeconomic History   Marital status: Married      Spouse name: Not on file   Number of children: Not on file   Years of education: Not on file   Highest education level: Not on file  Occupational History   Occupation: [name]      Comment: Retired  Tobacco Use   Smoking status: Former      Packs/day: 0.50       Years: 56.00      Pack years: 28.00      Types: Cigarettes      Start date: 02/18/1956      Quit date: 06/13/2011      Years since quitting: 9.8   Smokeless tobacco: Never  Vaping Use   Vaping Use: Never used  Substance and Sexual Activity   Alcohol use: No      Alcohol/week: 0.0 standard drinks   Drug use: No   Sexual activity: Never  Other Topics Concern   Not on file  Social History Narrative    Retired Naval architect, 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       aspirin 81 MG EC tablet Take 81 mg by mouth daily.       atorvastatin (LIPITOR) 80 MG tablet Take 1 tablet (80 mg total) by mouth daily at 6 PM. 90 tablet 3   carvedilol (COREG) 6.25 MG tablet Take 1 tablet (6.25 mg total) by mouth 2 (two) times daily with a meal. (Patient not taking: Reported on 01/13/2021) 180 tablet 1   clopidogrel (PLAVIX) 75 MG tablet TAKE 1 TABLET EVERY DAY WITH BREAKFAST 90 tablet 1   empagliflozin (JARDIANCE) 10 MG TABS tablet Take 1 tablet (10 mg total) by mouth daily before breakfast. (Patient not taking: Reported on 01/13/2021) 30 tablet 6   furosemide (LASIX) 20 MG tablet Take 20 mg by mouth daily as needed. Weight gain of 2-3 lbs in a 24 hour period or 5 lbs in one week       Insulin Glargine (LANTUS SOLOSTAR) 100 UNIT/ML Solostar Pen Inject 20 Units into the skin at bedtime. 15 mL 11   Insulin Lispro Junior KwikPen 100 UNIT/ML SOPN         mirtazapine (REMERON) 15 MG tablet Take 15 mg by mouth daily at 12 noon.  (Patient not taking:  Reported on 01/13/2021)       nitroGLYCERIN (NITROSTAT) 0.4 MG SL tablet Place 0.4 mg under the tongue every 5 (five) minutes as needed for chest pain (If no relief after 3rd dose, proceed to the ED).       OXYGEN 3 lpm 24/7 DME- [facility]       repaglinide (PRANDIN) 2 MG tablet Take 2 mg by mouth 2 (two) times daily before a meal.  (Patient not taking: Reported on 01/13/2021)       sacubitril-valsartan (ENTRESTO) 49-51 MG Take 1 tablet by mouth 2 (two) times daily. (Patient not taking: Reported on 01/13/2021) 180 tablet 1   vitamin B-12 (CYANOCOBALAMIN) 1000 MCG tablet  (Patient not taking: Reported on 01/13/2021)        No current facility-administered medications for this visit.      REVIEW OF SYSTEMS:    [X]  denotes positive finding, [ ]  denotes negative finding Cardiac   Comments:  Chest pain or chest pressure:      Shortness of breath upon exertion: x    Short of breath when lying flat: x    Irregular heart rhythm:             Vascular      Pain in calf, thigh, or hip brought on by ambulation: x    Pain in feet at night that wakes you up from your sleep:       Blood clot in your veins:      Leg swelling:              Pulmonary      Oxygen at home: x    Productive cough:       Wheezing:              Neurologic      Sudden weakness in arms or legs:       Sudden numbness in  arms or legs:       Sudden onset of difficulty speaking or slurred speech:      Temporary loss of vision in one eye:       Problems with dizziness:              Gastrointestinal      Blood in stool:         Vomited blood:              Genitourinary      Burning when urinating:       Blood in urine:             Psychiatric      Major depression:              Hematologic      Bleeding problems:      Problems with blood clotting too easily:             Skin      Rashes or ulcers:             Constitutional      Fever or chills:        PHYSICAL EXAM:    There were no vitals filed for this  visit.   GENERAL: The patient is a well-nourished male, in no acute distress. The vital signs are documented above. CARDIAC: There is a regular rate and rhythm.  VASCULAR: I evaluated his carotid artery with the SonoSite.  There is mild calcification at the bifurcation which is in the mid neck. PULMONARY: Nonlabored respirations ABDOMEN: Soft and non-tender with normal pitched bowel sounds.  MUSCULOSKELETAL: There are no major deformities or cyanosis. NEUROLOGIC: No focal weakness or paresthesias are detected. SKIN: There are no ulcers or rashes noted. PSYCHIATRIC: The patient has a normal affect.   STUDIES:    Reviewed his duplex ultrasound with the following findings: Right Carotid: Velocities in the right ICA are consistent with a 1-39%  stenosis.   Left Carotid: Velocities in the left ICA are consistent with a 1-39%  stenosis.   Vertebrals:  Bilateral vertebral arteries demonstrate antegrade flow.  Subclavians: Normal flow hemodynamics were seen in bilateral subclavian               arteries.  ASSESSMENT and PLAN    NYHA class III heart failure: The patient is on best medical therapy and still has significant symptoms largely from his shortness of breath.  I think he would be a good candidate for a Barostim device.  I discussed the details of the operation with the patient and his family.  All questions were answered.  We discussed the risk of bleeding as well as risk of infection.  We will await insurance approval.     Durene Cal, IV, MD, FACS [facility] [phone] Pager [phone]   No interval changes PE: CV:RRR PULM:CTA Neuro: intact Plan for right sided Barostim implant.  All questions answered.  WElls Lillyauna Jenkinson

## 2021-05-27 NOTE — Anesthesia Procedure Notes (Signed)
Procedure Name: Intubation Date/Time: 05/27/2021 9:56 AM Performed by: Adria Dill, CRNA Pre-anesthesia Checklist: Patient identified, Emergency Drugs available, Suction available and Patient being monitored Patient Re-evaluated:Patient Re-evaluated prior to induction Oxygen Delivery Method: Circle system utilized Preoxygenation: Pre-oxygenation with 100% oxygen Induction Type: IV induction Ventilation: Mask ventilation without difficulty Laryngoscope Size: Miller and 3 Grade View: Grade II Tube type: Oral Tube size: 7.5 mm Number of attempts: 1 Airway Equipment and Method: Stylet and Oral airway Placement Confirmation: ETT inserted through vocal cords under direct vision, positive ETCO2 and breath sounds checked- equal and bilateral Secured at: 21 cm Tube secured with: Tape Dental Injury: Teeth and Oropharynx as per pre-operative assessment

## 2021-05-27 NOTE — Transfer of Care (Signed)
Immediate Anesthesia Transfer of Care Note  Patient: Allen [name]  Procedure(s) Performed: INSERTION OF Alexia Freestone (Right: Chest)  Patient Location: PACU  Anesthesia Type:General  Level of Consciousness: drowsy and patient cooperative  Airway & Oxygen Therapy: Patient Spontanous Breathing and Patient connected to nasal cannula oxygen  Post-op Assessment: Report given to RN and Post -op Vital signs reviewed and stable  Post vital signs: Reviewed and stable  Last Vitals:  Vitals Value Taken Time  BP 140/83 05/27/21 1123  Temp    Pulse 71 05/27/21 1125  Resp 16 05/27/21 1125  SpO2 99 % 05/27/21 1125  Vitals shown include unvalidated device data.  Last Pain:  Vitals:   05/27/21 0652  TempSrc: Oral         Complications: No notable events documented.

## 2021-05-27 NOTE — Progress Notes (Signed)
Called [facility] to have rep paged.

## 2021-05-27 NOTE — Progress Notes (Signed)
[facility] [facility] called to page rep for preop

## 2021-05-27 NOTE — Anesthesia Postprocedure Evaluation (Signed)
Anesthesia Post Note  Patient: Allen [name]  Procedure(s) Performed: INSERTION OF Alexia Freestone (Right: Chest)     Patient location during evaluation: PACU Anesthesia Type: General Level of consciousness: awake and alert Pain management: pain level controlled Vital Signs Assessment: post-procedure vital signs reviewed and stable Respiratory status: spontaneous breathing, nonlabored ventilation, respiratory function stable and patient connected to nasal cannula oxygen Cardiovascular status: blood pressure returned to baseline and stable Postop Assessment: no apparent nausea or vomiting Anesthetic complications: no Comments: Patient will use O2 concentrator for ride home.   No notable events documented.  Last Vitals:  Vitals:   05/27/21 1210 05/27/21 1240  BP: (!) 148/85   Pulse: (!) 59   Resp: 19   Temp:  (!) 36.4 C  SpO2: 96%     Last Pain:  Vitals:   05/27/21 1240  TempSrc:   PainSc: 5                  Evelene Roussin A.

## 2021-05-27 NOTE — Telephone Encounter (Signed)
Patient's wife called to report that patient was nauseas. Called patient, said he was was not nauseas, just in pain. Asked if he had taken any pain medication, patient said no - that his wife was going to pick it up. Instructed patient to take prescribed pain medication per instructions.

## 2021-05-30 ENCOUNTER — Other Ambulatory Visit: Payer: Self-pay | Admitting: Internal Medicine

## 2021-06-06 DIAGNOSIS — J449 Chronic obstructive pulmonary disease, unspecified: Secondary | ICD-10-CM | POA: Diagnosis not present

## 2021-06-15 ENCOUNTER — Other Ambulatory Visit: Payer: Self-pay | Admitting: Cardiology

## 2021-06-17 ENCOUNTER — Encounter: Payer: Medicare PPO | Admitting: Student

## 2021-06-20 ENCOUNTER — Ambulatory Visit (INDEPENDENT_AMBULATORY_CARE_PROVIDER_SITE_OTHER): Payer: 59 | Admitting: Physician Assistant

## 2021-06-20 ENCOUNTER — Encounter: Payer: Self-pay | Admitting: Physician Assistant

## 2021-06-20 ENCOUNTER — Other Ambulatory Visit: Payer: Self-pay

## 2021-06-20 VITALS — BP 110/80 | HR 88 | Temp 98.3°F | Resp 20 | Ht 62.0 in | Wt 173.5 lb

## 2021-06-20 DIAGNOSIS — I5020 Unspecified systolic (congestive) heart failure: Secondary | ICD-10-CM

## 2021-06-20 NOTE — Progress Notes (Signed)
POST OPERATIVE OFFICE NOTE    CC:  F/u for surgery  HPI:  This is a 88 y.o. male who is s/p Barostim Neo Device implant on 05/27/21  by Dr. Myra Gianotti. This was implanted for NYHA class III Heart Failure. He tolerated the surgery well and was discharged post operatively. He reports no concerns today. His incisions have healed nicely. He denies any visual changes, slurred speech, hoarseness, trouble swallowing, facial drooping, upper or lower extremity weakness or numbness.   No Known Allergies  Current Outpatient Medications  Medication Sig Dispense Refill   aspirin 81 MG EC tablet Take 81 mg by mouth daily.     atorvastatin (LIPITOR) 80 MG tablet Take 1 tablet (80 mg total) by mouth daily at 6 PM. 90 tablet 3   carvedilol (COREG) 6.25 MG tablet TAKE 1 TABLET(6.25 MG) BY MOUTH TWICE DAILY WITH A MEAL 180 tablet 1   clopidogrel (PLAVIX) 75 MG tablet TAKE 1 TABLET EVERY DAY WITH BREAKFAST 90 tablet 1   empagliflozin (JARDIANCE) 10 MG TABS tablet Take 1 tablet (10 mg total) by mouth daily before breakfast. 30 tablet 6   ENTRESTO 49-51 MG TAKE 1 TABLET TWICE DAILY 180 tablet 1   furosemide (LASIX) 20 MG tablet Take 20 mg by mouth daily as needed (Weight gain of 2-3 lbs in a 24 hour period or 5 lbs in one week).     Insulin Glargine (LANTUS SOLOSTAR) 100 UNIT/ML Solostar Pen Inject 20 Units into the skin at bedtime. 15 mL 11   mirtazapine (REMERON) 15 MG tablet Take 15 mg by mouth at bedtime.     nitroGLYCERIN (NITROSTAT) 0.4 MG SL tablet Place 0.4 mg under the tongue every 5 (five) minutes as needed for chest pain (If no relief after 3rd dose, proceed to the ED).     oxyCODONE-acetaminophen (PERCOCET/ROXICET) 5-325 MG tablet Take 1 tablet by mouth every 6 (six) hours as needed. 20 tablet 0   oxyCODONE-acetaminophen (PERCOCET/ROXICET) 5-325 MG tablet Take 1 tablet by mouth every 6 (six) hours as needed. 20 tablet 0   OXYGEN Inhale 3 L into the lungs continuous. 3 lpm 24/7 DME- [facility]      repaglinide (PRANDIN) 2 MG tablet Take 2 mg by mouth 2 (two) times daily before a meal.     vitamin B-12 (CYANOCOBALAMIN) 1000 MCG tablet Take 1,000 mcg by mouth daily.     No current facility-administered medications for this visit.     ROS:  See HPI  Physical Exam:  Vitals:   06/20/21 0900  BP: 110/80  Pulse: 88  Resp: 20  Temp: 98.3 F (36.8 C)  TempSrc: Temporal  SpO2: 90%  Weight: 173 lb 8 oz (78.7 kg)  Height: 5\' 2"  (1.575 m)   General: well appearing, well nourished  Incision:  Right neck incision and right chest wall incisions both well healed Extremities:  moving all extremities without deficits Neuro: Alert and oriented, speech coherent, smile symmetric, tongue midline, sensation intact and equal bilaterally  Assessment/Plan:  This is a 88 y.o. male who is s/p: Barostim Neo Device Implant on 05/27/21 by Dr. 05/29/21 for NYHA class III heart Failure. He has been doing well since surgery. He has no new neurological deficits. His incisions have healed nicely. - he has follow up for the Barostim Neo Device on 07/01/21 with 07/03/21 - He will follow up with Alejandro Mulling as needed if he has any questions or concerns he knows to call our office   [place], PA-C [facility] [phone]  Clinic MD:  Myra Gianotti

## 2021-06-23 ENCOUNTER — Encounter: Payer: Medicaid Other | Admitting: Physician Assistant

## 2021-06-24 ENCOUNTER — Ambulatory Visit (INDEPENDENT_AMBULATORY_CARE_PROVIDER_SITE_OTHER): Payer: 59

## 2021-06-24 DIAGNOSIS — I5022 Chronic systolic (congestive) heart failure: Secondary | ICD-10-CM

## 2021-06-24 DIAGNOSIS — Z95810 Presence of automatic (implantable) cardiac defibrillator: Secondary | ICD-10-CM

## 2021-06-24 NOTE — Progress Notes (Signed)
EPIC Encounter for ICM Monitoring  Patient Name: Allen [name] is a 88 y.o. male Date: 06/24/2021 Primary Care Physican: [name], MD Primary Cardiologist: Diona Browner Electrophysiologist: Allred Bi-V Pacing: 89%     04/22/2021 Weight:  177 lbs 06/24/2021 Weight:    AT/AF Burden <1%           Spoke with patient and heart failure questions reviewed.  Pt reports taking Furosemide for fluid symptoms this week with relief.  Barostim procedure scheduled 12/16.        CorVue thoracic impedance suggesting possible fluid accumulation starting 1/8 and returned close to baseline today, 1/13.   Prescribed:  Furosemide 20 mg Take 1 tablet by mouth daily as needed. Weight gain of 2-3 lbs in a 24 hour period or 5 lbs in one week.  Pt reports 02/28/21 he is taking Furosemide daily instead of PRN.   Labs: 01/13/2021 Creatinine 1.64, BUN 16, Potassium 4.8, Sodium 145 12/29/2020 Creatinine 2.02, BUN 24, Potassium 4.7, Sodium 142, GFR 32  10/13/2020 Creatinine 1.61, BUN 16, Potassium 4.8, Sodium 144 07/08/2020 Creatinine 1.46, BUN 20, Potassium 4.7, Sodium 142, GFR 44-50  A complete set of results can be found in Results Review.   Recommendations: No changes and encouraged to call if experiencing any fluid symptoms.   Follow-up plan: ICM clinic phone appointment on 07/25/2021.   91 day device clinic remote transmission 07/18/2021.     EP/Cardiology Office Visits: 06/27/2021 with Dr. Diona Browner.  07/01/2021 with [name], PA.     Copy of ICM check sent to Dr. Johney Frame and Dr Diona Browner as Lorain Childes for OV on Monday, 1/16.    3 month ICM trend: 06/24/2021.    12-14 Month ICM trend:     [name], RN 06/24/2021 2:36 PM

## 2021-06-26 NOTE — Progress Notes (Signed)
Cardiology Office Note  Date: 06/27/2021   ID: Allen [name], DOB Jan 08, 1934, MRN [id number]  PCP:  Neale Burly, MD  Cardiologist:  Rozann Lesches, MD Electrophysiologist:  Thompson Grayer, MD   Chief Complaint  Patient presents with   Cardiac follow-up    History of Present Illness: Allen [name] is an 88 y.o. male last seen in July 2022.  He is here for a routine visit.  Reports no major change, weight has been stable, NYHA class II-III dyspnea depending on level of activity.  He is fairly sedentary at this time.  He is status post placement of a Barostim Neo device by Dr. Trula Slade.  I reviewed the recent office note.  This has healed well.  He has a St. Jude biventricular ICD in place with follow-up by Dr. Rayann Heman. Recent thoracic impedance suggested return to baseline as of 1/13. Biventricular pacing at 89%.  He has had no device shocks or syncope.  Last creatinine 1.68 and stable.  I reviewed his medications which are outlined below.  Past Medical History:  Diagnosis Date   AICD (automatic cardioverter/defibrillator) present    Biventricular ICD (implantable cardioverter-defibrillator) in place 07/17/2018   CAD (coronary artery disease)    a. 04/2016: s/p CABG with LIMA-LAD, SVG-D1, SVG-RCA, and seq SVG-OM1-OM2   CHF (congestive heart failure) (HCC)    CKD (chronic kidney disease)    Essential hypertension    Hyperlipidemia    Ischemic cardiomyopathy    LVEF 35% May 2016   LBBB (left bundle branch block)    Pneumonia due to COVID-19 virus    Sinus bradycardia    Type 2 diabetes mellitus ([place])     Past Surgical History:  Procedure Laterality Date   BIV ICD INSERTION CRT-D  07/17/2018   BIV ICD INSERTION CRT-D N/A 07/17/2018   Procedure: BIV ICD INSERTION CRT-D;  Surgeon: Thompson Grayer, MD;  Location: [facility];  Service: Cardiovascular;  Laterality: N/A;   CARDIAC CATHETERIZATION N/A 11/02/2014   Procedure: Left Heart Cath and Coronary Angiography;   Surgeon: Belva Crome, MD;  Location: [facility];  Service: Cardiovascular;  Laterality: N/A;   CARDIAC CATHETERIZATION N/A 05/03/2016   Procedure: Left Heart Cath and Coronary Angiography;  Surgeon: Peter M Martinique, MD;  Location: [facility];  Service: Cardiovascular;  Laterality: N/A;   CORONARY ARTERY BYPASS GRAFT N/A 05/08/2016   Procedure: CORONARY ARTERY BYPASS GRAFTING (CABG) x 5;  Surgeon: Gaye Pollack, MD;  Location: [facility];  Service: Open Heart Surgery;  Laterality: N/A;   ENDOVEIN HARVEST OF GREATER SAPHENOUS VEIN  05/08/2016   Procedure: ENDOVEIN HARVEST OF GREATER SAPHENOUS VEIN;  Surgeon: Gaye Pollack, MD;  Location: [facility];  Service: Open Heart Surgery;;   TEE WITHOUT CARDIOVERSION N/A 05/08/2016   Procedure: TRANSESOPHAGEAL ECHOCARDIOGRAM (TEE);  Surgeon: Gaye Pollack, MD;  Location: [facility];  Service: Open Heart Surgery;  Laterality: N/A;    Current Outpatient Medications  Medication Sig Dispense Refill   aspirin 81 MG EC tablet Take 81 mg by mouth daily.     atorvastatin (LIPITOR) 80 MG tablet Take 1 tablet (80 mg total) by mouth daily at 6 PM. 90 tablet 3   carvedilol (COREG) 6.25 MG tablet TAKE 1 TABLET(6.25 MG) BY MOUTH TWICE DAILY WITH A MEAL 180 tablet 1   clopidogrel (PLAVIX) 75 MG tablet TAKE 1 TABLET EVERY DAY WITH BREAKFAST 90 tablet 1   empagliflozin (JARDIANCE) 10 MG TABS tablet  Take 1 tablet (10 mg total) by mouth daily before breakfast. 30 tablet 6   ENTRESTO 49-51 MG TAKE 1 TABLET TWICE DAILY 180 tablet 1   furosemide (LASIX) 20 MG tablet Take 20 mg by mouth daily as needed (Weight gain of 2-3 lbs in a 24 hour period or 5 lbs in one week).     Insulin Glargine (LANTUS SOLOSTAR) 100 UNIT/ML Solostar Pen Inject 20 Units into the skin at bedtime. 15 mL 11   mirtazapine (REMERON) 15 MG tablet Take 15 mg by mouth at bedtime.     nitroGLYCERIN (NITROSTAT) 0.4 MG SL tablet Place 0.4 mg under the tongue every 5 (five) minutes as needed for chest pain  (If no relief after 3rd dose, proceed to the ED).     oxyCODONE-acetaminophen (PERCOCET/ROXICET) 5-325 MG tablet Take 1 tablet by mouth every 6 (six) hours as needed. 20 tablet 0   oxyCODONE-acetaminophen (PERCOCET/ROXICET) 5-325 MG tablet Take 1 tablet by mouth every 6 (six) hours as needed. 20 tablet 0   OXYGEN Inhale 3 L into the lungs continuous. 3 lpm 24/7 DME- [facility]     repaglinide (PRANDIN) 2 MG tablet Take 2 mg by mouth 2 (two) times daily before a meal.     vitamin B-12 (CYANOCOBALAMIN) 1000 MCG tablet Take 1,000 mcg by mouth daily.     No current facility-administered medications for this visit.   Allergies:  Patient has no known allergies.   ROS: No palpitations.  No orthopnea or PND.  No leg swelling.  Physical Exam: VS:  BP 114/62    Pulse 92    Ht 5\' 2"  (1.575 m)    Wt 175 lb 3.2 oz (79.5 kg)    SpO2 94%    BMI 32.04 kg/m , BMI Body mass index is 32.04 kg/m.  Wt Readings from Last 3 Encounters:  06/27/21 175 lb 3.2 oz (79.5 kg)  06/20/21 173 lb 8 oz (78.7 kg)  05/24/21 173 lb 9.6 oz (78.7 kg)    General: Patient appears comfortable at rest. HEENT: Conjunctiva and lids normal, wearing a mask. Neck: Supple, no elevated JVP or carotid bruits, no thyromegaly. Lungs: Clear to auscultation, nonlabored breathing at rest. Cardiac: Regular rate and rhythm, no S3, 2/6 systolic murmur. Extremities: No pitting edema.  ECG:  An ECG dated 10/13/2020 was personally reviewed today and demonstrated:  Biventricular pacing with PVCs.  Recent Labwork: 07/08/2020: Magnesium 2.1 10/13/2020: NT-Pro BNP 935 05/24/2021: ALT 18; AST 33; BUN 21; Creatinine, Ser 1.68; Hemoglobin 17.7; Platelets 223; Potassium 5.1; Sodium 144     Component Value Date/Time   CHOL 170 11/02/2014 0213   TRIG 121 11/02/2014 0213   HDL 45 11/02/2014 0213   CHOLHDL 3.8 11/02/2014 0213   VLDL 24 11/02/2014 0213   LDLCALC 101 (H) 11/02/2014 0213    Other Studies Reviewed Today:  Echocardiogram 07/20/2020:  1.  Left ventricular ejection fraction, by estimation, is approximately  35%. The left ventricle has moderately decreased function. The left  ventricle demonstrates global hypokinesis. The left ventricular internal  cavity size was mildly dilated. There is  moderate left ventricular hypertrophy. Left ventricular diastolic  parameters are indeterminate. Abnormal global longitudinal strain of  -9.3%.   2. Right ventricular systolic function is mildly reduced. The right  ventricular size is normal. A device wire is visualized. There is normal  pulmonary artery systolic pressure. The estimated right ventricular  systolic pressure is [id number] mmHg.   3. Left atrial size was mild to moderately dilated.  4. The mitral valve is grossly normal. Mild mitral valve regurgitation.   5. The aortic valve is tricuspid. There is moderate calcification of the  aortic valve. Aortic valve regurgitation is mild to moderate. Mild to  moderate aortic valve stenosis. Aortic valve mean gradient measures 11.3  mmHg. Dimentionless index 0.41.   6. Aortic dilatation noted. There is mild dilatation of the aortic root,  measuring 40 mm.   7. The inferior vena cava is normal in size with greater than 50%  respiratory variability, suggesting right atrial pressure of 3 mmHg.   Carotid Dopplers 04/11/2021: Summary:  Right Carotid: Velocities in the right ICA are consistent with a 1-39%  stenosis.   Left Carotid: Velocities in the left ICA are consistent with a 1-39%  stenosis.   Vertebrals:  Bilateral vertebral arteries demonstrate antegrade flow.  Subclavians: Normal flow hemodynamics were seen in bilateral subclavian               arteries.   Assessment and Plan:  1.  HFrEF with chronic systolic heart failure and ischemic cardiomyopathy, clinically stable at this point.  Last LVEF 35% and RV contraction mildly reduced.  Weight has been stable as was recent thoracic impedance on current diuretic regimen.  He is status  post placement of Barostim Neo  device by Dr. Trula Slade.  Continue Coreg, [place], [place], and Lasix.  Not on Aldactone with baseline renal insufficiency and presence of SGLT2 inhibitor.  2.  St. Jude biventricular ICD in place, keep follow-up with Dr. Rayann Heman.  No recent device shocks or syncope.  Biventricular pacing 89%.  3.  CKD stage IIIb, most recent creatinine 1.68.  4.  CAD status post CABG.  No active angina at this time.  Continue aspirin and Lipitor.  He has not required any recent nitroglycerin.  5.  Aortic stenosis, mild to moderate by echocardiogram in February 2022.  We will plan a repeat echocardiogram around the time of his next visit.  Medication Adjustments/Labs and Tests Ordered: Current medicines are reviewed at length with the patient today.  Concerns regarding medicines are outlined above.   Tests Ordered: No orders of the defined types were placed in this encounter.   Medication Changes: No orders of the defined types were placed in this encounter.   Disposition:  Follow up  6 months.  Signed, Satira Sark, MD, [facility] 06/27/2021 8:42 AM    [facility] [place], [place], [place] [address] Phone: [phone]; Fax: [phone]

## 2021-06-27 ENCOUNTER — Other Ambulatory Visit: Payer: Self-pay

## 2021-06-27 ENCOUNTER — Encounter: Payer: Self-pay | Admitting: Cardiology

## 2021-06-27 ENCOUNTER — Ambulatory Visit (INDEPENDENT_AMBULATORY_CARE_PROVIDER_SITE_OTHER): Payer: 59 | Admitting: Cardiology

## 2021-06-27 VITALS — BP 114/62 | HR 92 | Ht 62.0 in | Wt 175.2 lb

## 2021-06-27 DIAGNOSIS — I255 Ischemic cardiomyopathy: Secondary | ICD-10-CM | POA: Diagnosis not present

## 2021-06-27 DIAGNOSIS — I5022 Chronic systolic (congestive) heart failure: Secondary | ICD-10-CM

## 2021-06-27 DIAGNOSIS — I5020 Unspecified systolic (congestive) heart failure: Secondary | ICD-10-CM | POA: Diagnosis not present

## 2021-06-27 DIAGNOSIS — I350 Nonrheumatic aortic (valve) stenosis: Secondary | ICD-10-CM

## 2021-06-27 DIAGNOSIS — I25119 Atherosclerotic heart disease of native coronary artery with unspecified angina pectoris: Secondary | ICD-10-CM

## 2021-06-27 NOTE — Patient Instructions (Signed)
Medication Instructions:  Your physician recommends that you continue on your current medications as directed. Please refer to the Current Medication list given to you today.  *If you need a refill on your cardiac medications before your next appointment, please call your pharmacy*   Lab Work: None If you have labs (blood work) drawn today and your tests are completely normal, you will receive your results only by: MyChart Message (if you have MyChart) OR A paper copy in the mail If you have any lab test that is abnormal or we need to change your treatment, we will call you to review the results.   Testing/Procedures: None   Follow-Up: At [facility], you and your health needs are our priority.  As part of our continuing mission to provide you with exceptional heart care, we have created designated Provider Care Teams.  These Care Teams include your primary Cardiologist (physician) and Advanced Practice Providers (APPs -  Physician Assistants and Nurse Practitioners) who all work together to provide you with the care you need, when you need it.  We recommend signing up for the patient portal called "MyChart".  Sign up information is provided on this After Visit Summary.  MyChart is used to connect with patients for Virtual Visits (Telemedicine).  Patients are able to view lab/test results, encounter notes, upcoming appointments, etc.  Non-urgent messages can be sent to your provider as well.   To learn more about what you can do with MyChart, go to [email or web address].    Your next appointment:   6 month(s)  The format for your next appointment:   In Person  Provider:   Malike McDowell, MD   Other Instructions    

## 2021-06-27 NOTE — Addendum Note (Signed)
Addended by: Roseanne Reno on: 06/27/2021 08:46 AM   Modules accepted: Orders

## 2021-07-01 ENCOUNTER — Encounter: Payer: Self-pay | Admitting: Student

## 2021-07-01 ENCOUNTER — Ambulatory Visit (INDEPENDENT_AMBULATORY_CARE_PROVIDER_SITE_OTHER): Payer: 59 | Admitting: Student

## 2021-07-01 ENCOUNTER — Other Ambulatory Visit: Payer: Self-pay

## 2021-07-01 VITALS — BP 104/62 | HR 64

## 2021-07-01 DIAGNOSIS — I255 Ischemic cardiomyopathy: Secondary | ICD-10-CM | POA: Diagnosis not present

## 2021-07-01 DIAGNOSIS — I5022 Chronic systolic (congestive) heart failure: Secondary | ICD-10-CM

## 2021-07-01 DIAGNOSIS — Z79899 Other long term (current) drug therapy: Secondary | ICD-10-CM

## 2021-07-01 DIAGNOSIS — I25119 Atherosclerotic heart disease of native coronary artery with unspecified angina pectoris: Secondary | ICD-10-CM

## 2021-07-01 DIAGNOSIS — R001 Bradycardia, unspecified: Secondary | ICD-10-CM

## 2021-07-01 NOTE — Patient Instructions (Signed)

## 2021-07-01 NOTE — Progress Notes (Signed)
Electrophysiology Office Note Date: 07/01/2021  ID:  Allen [name], DOB Aug 20, 1933, MRN [id number]  PCP: Neale Burly, MD Primary Cardiologist: Rozann Lesches, MD Electrophysiologist: Thompson Grayer, MD   CC: Barostim titration   Allen [name] is a 88 y.o. male seen today for barostim titration. he is doing well since implantation, though has not noticed much difference in his symptoms. He remains fatigued and SOB with chores such as laundry, but is able to do them.  Hasn't been fishing with the weather or since having COVID, but this is one of his goals. Site well healed.  The patients goals for barostim titration are to fish without fatigue or SOB, do more household chores, and start walking again.    Device History: Barostim (standard) implanted 05/27/2021 for Chronic systolic CHF St. Jude BiV ICD implanted 07/17/2018 for ICM, NYHA III, SSS, LBBB History of appropriate therapy: No History of AAD therapy: No    Past Medical History:  Diagnosis Date   AICD (automatic cardioverter/defibrillator) present    Biventricular ICD (implantable cardioverter-defibrillator) in place 07/17/2018   CAD (coronary artery disease)    a. 04/2016: s/p CABG with LIMA-LAD, SVG-D1, SVG-RCA, and seq SVG-OM1-OM2   CHF (congestive heart failure) ([place])    CKD (chronic kidney disease)    Essential hypertension    Hyperlipidemia    Ischemic cardiomyopathy    LVEF 35% May 2016   LBBB (left bundle branch block)    Pneumonia due to COVID-19 virus    Sinus bradycardia    Type 2 diabetes mellitus ([place])    Past Surgical History:  Procedure Laterality Date   BIV ICD INSERTION CRT-D  07/17/2018   BIV ICD INSERTION CRT-D N/A 07/17/2018   Procedure: BIV ICD INSERTION CRT-D;  Surgeon: Thompson Grayer, MD;  Location: [facility] [facility];  Service: Cardiovascular;  Laterality: N/A;   CARDIAC CATHETERIZATION N/A 11/02/2014   Procedure: Left Heart Cath and Coronary Angiography;  Surgeon: Belva Crome, MD;   Location: [facility];  Service: Cardiovascular;  Laterality: N/A;   CARDIAC CATHETERIZATION N/A 05/03/2016   Procedure: Left Heart Cath and Coronary Angiography;  Surgeon: Peter M Martinique, MD;  Location: [facility];  Service: Cardiovascular;  Laterality: N/A;   CORONARY ARTERY BYPASS GRAFT N/A 05/08/2016   Procedure: CORONARY ARTERY BYPASS GRAFTING (CABG) x 5;  Surgeon: Gaye Pollack, MD;  Location: [facility];  Service: Open Heart Surgery;  Laterality: N/A;   ENDOVEIN HARVEST OF GREATER SAPHENOUS VEIN  05/08/2016   Procedure: ENDOVEIN HARVEST OF GREATER SAPHENOUS VEIN;  Surgeon: Gaye Pollack, MD;  Location: [facility];  Service: Open Heart Surgery;;   TEE WITHOUT CARDIOVERSION N/A 05/08/2016   Procedure: TRANSESOPHAGEAL ECHOCARDIOGRAM (TEE);  Surgeon: Gaye Pollack, MD;  Location: [facility];  Service: Open Heart Surgery;  Laterality: N/A;    Current Outpatient Medications  Medication Sig Dispense Refill   aspirin 81 MG EC tablet Take 81 mg by mouth daily.     atorvastatin (LIPITOR) 80 MG tablet Take 1 tablet (80 mg total) by mouth daily at 6 PM. 90 tablet 3   carvedilol (COREG) 6.25 MG tablet TAKE 1 TABLET(6.25 MG) BY MOUTH TWICE DAILY WITH A MEAL 180 tablet 1   clopidogrel (PLAVIX) 75 MG tablet TAKE 1 TABLET EVERY DAY WITH BREAKFAST 90 tablet 1   empagliflozin (JARDIANCE) 10 MG TABS tablet Take 1 tablet (10 mg total) by mouth daily before breakfast. 30 tablet 6   ENTRESTO 49-51  MG TAKE 1 TABLET TWICE DAILY 180 tablet 1   furosemide (LASIX) 20 MG tablet Take 20 mg by mouth daily as needed (Weight gain of 2-3 lbs in a 24 hour period or 5 lbs in one week).     Insulin Glargine (LANTUS SOLOSTAR) 100 UNIT/ML Solostar Pen Inject 20 Units into the skin at bedtime. 15 mL 11   mirtazapine (REMERON) 15 MG tablet Take 15 mg by mouth at bedtime.     nitroGLYCERIN (NITROSTAT) 0.4 MG SL tablet Place 0.4 mg under the tongue every 5 (five) minutes as needed for chest pain (If no relief after 3rd dose,  proceed to the ED).     oxyCODONE-acetaminophen (PERCOCET/ROXICET) 5-325 MG tablet Take 1 tablet by mouth every 6 (six) hours as needed. 20 tablet 0   oxyCODONE-acetaminophen (PERCOCET/ROXICET) 5-325 MG tablet Take 1 tablet by mouth every 6 (six) hours as needed. 20 tablet 0   OXYGEN Inhale 3 L into the lungs continuous. 3 lpm 24/7 DME- [facility]     repaglinide (PRANDIN) 2 MG tablet Take 2 mg by mouth 2 (two) times daily before a meal.     vitamin B-12 (CYANOCOBALAMIN) 1000 MCG tablet Take 1,000 mcg by mouth daily.     No current facility-administered medications for this visit.    Allergies:   Patient has no known allergies.   Social History: Social History   Socioeconomic History   Marital status: Married    Spouse name: Not on file   Number of children: Not on file   Years of education: Not on file   Highest education level: Not on file  Occupational History   Occupation: [name]    Comment: Retired  Tobacco Use   Smoking status: Former    Packs/day: 0.50    Years: 56.00    Pack years: 28.00    Types: Cigarettes    Start date: 02/18/1956    Quit date: 06/13/2011    Years since quitting: 10.0   Smokeless tobacco: Never  Vaping Use   Vaping Use: Never used  Substance and Sexual Activity   Alcohol use: No    Alcohol/week: 0.0 standard drinks   Drug use: No   Sexual activity: Never  Other Topics Concern   Not on file  Social History Narrative   Retired Administrator, 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  Physical Exam: Vitals:   07/01/21 1104  BP: 104/62  Pulse: 64  SpO2: 99%     GEN- The patient is well appearing, alert and oriented x 3 today.   HEENT: normocephalic, atraumatic; sclera clear, conjunctiva pink; hearing intact; oropharynx clear; neck supple  Lungs- Clear to ausculation bilaterally, normal work of breathing.  No wheezes, rales, rhonchi Heart- Regular rate and rhythm, no murmurs, rubs or gallops  GI- soft, non-tender, non-distended, bowel sounds present  Extremities- no clubbing, cyanosis, or  edema  MS- no significant deformity or atrophy Skin- warm and dry, no rash or lesion; PPM pocket well healed Psych- euthymic mood, full affect Neuro- strength and sensation are intact  Barostim Interrogation- Performed personally and reviewed in detail today,  See scanned report  EKG:  EKG is not ordered today.  Recent Labs: 07/08/2020: Magnesium 2.1 10/13/2020: NT-Pro BNP 935 05/24/2021: ALT 18; BUN 21; Creatinine, Ser 1.68; Hemoglobin 17.7; Platelets 223; Potassium 5.1; Sodium 144   Wt Readings from Last 3 Encounters:  06/27/21 175 lb 3.2 oz (79.5 kg)  06/20/21 173 lb 8 oz (78.7 kg)  05/24/21 173 lb 9.6 oz (78.7 kg)     Other studies Reviewed: Additional studies/ records that were reviewed today include: Previous EP notes  Assessment and Plan:  1. Chronic systolic CHF s/p St. Jude and Barostim implantation NYHA III symptoms.   Device titrated from 1.0 millamp to 3.4 milliamp by increments of 0.4 with good blood pressure response and no stimulation.  Device impedence stable. Pt goals are to fish, walk, and chore.  Normal device function See scanned report. Will follow up in 2 weeks to continue titration with goal of 6-8 milliamps for chronic settings.  S/p AV optimization 10/27/2019. He felt somewhat better and has maintained this. V-V optimization 02/2020. QRS 176 > 122 ms after reprogramming with  positive/RBBB V1 and biphasic lead 1. Previous optimization imaging was reviewed and felt appropriate to increase PAV delay at this time, with intrinsic A to R of 300 ms.   2. Sinus bradycardia Stable post CRT    3. HTN Stable on current regimen    4. Vertiginous dizziness Encouraged  PCP follow up.   Current medicines are reviewed at length with the patient today.   The patient does not have concerns regarding his medicines.  The following changes were made today:  none   Disposition:   Follow up with EP APP in 2 Weeks for further barostim titration.   Jacalyn Lefevre, PA-C  07/01/2021 8:26 AM  [facility] [address] [place] [address] [phone] (office) [phone] (fax)

## 2021-07-15 ENCOUNTER — Encounter: Payer: Self-pay | Admitting: Student

## 2021-07-15 ENCOUNTER — Other Ambulatory Visit: Payer: Self-pay

## 2021-07-15 ENCOUNTER — Ambulatory Visit (INDEPENDENT_AMBULATORY_CARE_PROVIDER_SITE_OTHER): Payer: 59 | Admitting: Student

## 2021-07-15 VITALS — BP 120/68 | HR 55 | Ht 62.0 in | Wt 174.8 lb

## 2021-07-15 DIAGNOSIS — R001 Bradycardia, unspecified: Secondary | ICD-10-CM

## 2021-07-15 DIAGNOSIS — I10 Essential (primary) hypertension: Secondary | ICD-10-CM

## 2021-07-15 DIAGNOSIS — I5022 Chronic systolic (congestive) heart failure: Secondary | ICD-10-CM

## 2021-07-15 NOTE — Progress Notes (Signed)
Electrophysiology Office Note Date: 07/15/2021  ID:  Allen [name], DOB 1933-11-28, MRN [id number]  PCP: [name], MD Primary Cardiologist: [name], MD Electrophysiologist: Hillis Range, MD   CC: Barostim titration   Allen [name] is a 88 y.o. male seen today for barostim titration.   At last visit he was programmed from 1.0 to 3.4 ma without stim.   Hasn't noticed much difference in his symptoms still. Still fatigued and SOB with chores, but can push through. One of his goals remains to be able to fish without difficulty. More so due to the weather. He has had an improvement in his dizziness, but has also been given exercises from his PCP.  The patients goals for barostim titration are to fish without fatigue or SOB, do more household chores, and start walking again.    Device History: Barostim (standard) implanted 05/27/2021 for Chronic systolic CHF St. Jude BiV ICD implanted 07/17/2018 for ICM, NYHA III, SSS, LBBB History of appropriate therapy: No History of AAD therapy: No    Past Medical History:  Diagnosis Date   AICD (automatic cardioverter/defibrillator) present    Biventricular ICD (implantable cardioverter-defibrillator) in place 07/17/2018   CAD (coronary artery disease)    a. 04/2016: s/p CABG with LIMA-LAD, SVG-D1, SVG-RCA, and seq SVG-OM1-OM2   CHF (congestive heart failure) (HCC)    CKD (chronic kidney disease)    Essential hypertension    Hyperlipidemia    Ischemic cardiomyopathy    LVEF 35% May 2016   LBBB (left bundle branch block)    Pneumonia due to COVID-19 virus    Sinus bradycardia    Type 2 diabetes mellitus (HCC)    Past Surgical History:  Procedure Laterality Date   BIV ICD INSERTION CRT-D  07/17/2018   BIV ICD INSERTION CRT-D N/A 07/17/2018   Procedure: BIV ICD INSERTION CRT-D;  Surgeon: Hillis Range, MD;  Location: [facility] INVASIVE CV LAB;  Service: Cardiovascular;  Laterality: N/A;   CARDIAC CATHETERIZATION N/A 11/02/2014    Procedure: Left Heart Cath and Coronary Angiography;  Surgeon: Lyn Records, MD;  Location: [facility] INVASIVE CV LAB;  Service: Cardiovascular;  Laterality: N/A;   CARDIAC CATHETERIZATION N/A 05/03/2016   Procedure: Left Heart Cath and Coronary Angiography;  Surgeon: Peter M Swaziland, MD;  Location: [facility] INVASIVE CV LAB;  Service: Cardiovascular;  Laterality: N/A;   CORONARY ARTERY BYPASS GRAFT N/A 05/08/2016   Procedure: CORONARY ARTERY BYPASS GRAFTING (CABG) x 5;  Surgeon: Alleen Borne, MD;  Location: [facility] OR;  Service: Open Heart Surgery;  Laterality: N/A;   ENDOVEIN HARVEST OF GREATER SAPHENOUS VEIN  05/08/2016   Procedure: ENDOVEIN HARVEST OF GREATER SAPHENOUS VEIN;  Surgeon: Alleen Borne, MD;  Location: [facility] OR;  Service: Open Heart Surgery;;   TEE WITHOUT CARDIOVERSION N/A 05/08/2016   Procedure: TRANSESOPHAGEAL ECHOCARDIOGRAM (TEE);  Surgeon: Alleen Borne, MD;  Location: [facility] OR;  Service: Open Heart Surgery;  Laterality: N/A;    Current Outpatient Medications  Medication Sig Dispense Refill   aspirin 81 MG EC tablet Take 81 mg by mouth daily.     atorvastatin (LIPITOR) 80 MG tablet Take 1 tablet (80 mg total) by mouth daily at 6 PM. 90 tablet 3   carvedilol (COREG) 6.25 MG tablet TAKE 1 TABLET(6.25 MG) BY MOUTH TWICE DAILY WITH A MEAL 180 tablet 1   clopidogrel (PLAVIX) 75 MG tablet TAKE 1 TABLET EVERY DAY WITH BREAKFAST 90 tablet 1   empagliflozin (JARDIANCE) 10 MG TABS tablet  Take 1 tablet (10 mg total) by mouth daily before breakfast. 30 tablet 6   ENTRESTO 49-51 MG TAKE 1 TABLET TWICE DAILY 180 tablet 1   furosemide (LASIX) 20 MG tablet Take 20 mg by mouth daily as needed (Weight gain of 2-3 lbs in a 24 hour period or 5 lbs in one week).     Insulin Glargine (LANTUS SOLOSTAR) 100 UNIT/ML Solostar Pen Inject 20 Units into the skin at bedtime. 15 mL 11   mirtazapine (REMERON) 15 MG tablet Take 15 mg by mouth at bedtime.     nitroGLYCERIN (NITROSTAT) 0.4 MG SL tablet Place 0.4 mg under the  tongue every 5 (five) minutes as needed for chest pain (If no relief after 3rd dose, proceed to the ED).     oxyCODONE-acetaminophen (PERCOCET/ROXICET) 5-325 MG tablet Take 1 tablet by mouth every 6 (six) hours as needed. 20 tablet 0   oxyCODONE-acetaminophen (PERCOCET/ROXICET) 5-325 MG tablet Take 1 tablet by mouth every 6 (six) hours as needed. 20 tablet 0   OXYGEN Inhale 3 L into the lungs continuous. 3 lpm 24/7 DME- [facility]     repaglinide (PRANDIN) 2 MG tablet Take 2 mg by mouth 2 (two) times daily before a meal.     vitamin B-12 (CYANOCOBALAMIN) 1000 MCG tablet Take 1,000 mcg by mouth daily.     No current facility-administered medications for this visit.    Allergies:   Patient has no known allergies.   Social History: Social History   Socioeconomic History   Marital status: Married    Spouse name: Not on file   Number of children: Not on file   Years of education: Not on file   Highest education level: Not on file  Occupational History   Occupation: [employer]    Comment: Retired  Tobacco Use   Smoking status: Former    Packs/day: 0.50    Years: 56.00    Pack years: 28.00    Types: Cigarettes    Start date: 02/18/1956    Quit date: 06/13/2011    Years since quitting: 10.0   Smokeless tobacco: Never  Vaping Use   Vaping Use: Never used  Substance and Sexual Activity   Alcohol use: No    Alcohol/week: 0.0 standard drinks   Drug use: No   Sexual activity: Never  Other Topics Concern   Not on file  Social History Narrative   Retired Naval architecttruck driver, 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  Physical Exam: Vitals:   07/15/21 0821 07/15/21 0841  BP: 118/60 120/68  Pulse: (!) 55   SpO2: (!) 86%   Weight: 174 lb 12.8 oz (79.3 kg)   Height: 5\' 2"  (1.575 m)      General:  Well appearing. No resp difficulty. HEENT: Normal Neck: Supple. JVP 5-6. Carotids 2+ bilat; no bruits. No thyromegaly or nodule noted. Cor: PMI  nondisplaced. RRR, No M/G/R noted Lungs: CTAB, normal effort. Abdomen: Soft, non-tender, non-distended, no HSM. No bruits or masses. +BS   Extremities: No cyanosis, clubbing, or rash. R and LLE no edema.  Neuro: Alert & orientedx3, cranial nerves grossly intact. moves all 4 extremities w/o difficulty. Affect pleasant   Barostim Interrogation- Performed personally and reviewed in detail today,  See scanned report  EKG:  EKG is not ordered today.  Recent Labs: 10/13/2020: NT-Pro BNP 935 05/24/2021: ALT 18; BUN 21; Creatinine, Ser 1.68; Hemoglobin 17.7; Platelets 223; Potassium 5.1; Sodium 144   Wt Readings from Last 3 Encounters:  07/15/21 174 lb 12.8 oz (79.3 kg)  06/27/21 175 lb 3.2 oz (79.5 kg)  06/20/21 173 lb 8 oz (78.7 kg)     Other studies Reviewed: Additional studies/ records that were reviewed today include: Previous EP notes  Assessment and Plan:  1. Chronic systolic CHF s/p St. Jude and Barostim implantation NYHA III symptoms.   Device titrated from 3.4 millamp to 6.0 milliamp by increments of 0.4 with stable BP and no stimulation.  Device impedence stable Pt goals are to fish, walk, and chore.  Normal device function See scanned report. Will follow up in 2-4 weeks to continue titration with goal of 6-8 milliamps for chronic settings.  S/p AV optimization 10/27/2019. He felt somewhat better and has maintained this. V-V optimization 02/2020. QRS 176 > 122 ms after reprogramming with positive/RBBB  V1 and biphasic lead 1. Previous optimization imaging was reviewed and felt appropriate to increase PAV delay at this time, with intrinsic A to R of 300 ms.   2. Sinus bradycardia Stable post CRT    3. HTN Stable on current regimen    4. Vertiginous dizziness Encouraged  PCP follow up.   Current medicines are reviewed at length with the patient today.   The patient does not have concerns regarding his medicines.  The following changes were made today:  none   Disposition:   Follow up with EP APP in 2-4 Weeks for further barostim titration.   Dustin Flock, PA-C  07/15/2021 8:27 AM  [facility] [address] [place] [address] [phone] (office) [phone] (fax)

## 2021-07-15 NOTE — Patient Instructions (Addendum)
Medication Instructions:  Your physician recommends that you continue on your current medications as directed. Please refer to the Current Medication list given to you today.  *If you need a refill on your cardiac medications before your next appointment, please call your pharmacy*   Lab Work: TODAY: BMET, ProBNP  If you have labs (blood work) drawn today and your tests are completely normal, you will receive your results only by: MyChart Message (if you have MyChart) OR A paper copy in the mail If you have any lab test that is abnormal or we need to change your treatment, we will call you to review the results.   Follow-Up: At [facility], you and your health needs are our priority.  As part of our continuing mission to provide you with exceptional heart care, we have created designated Provider Care Teams.  These Care Teams include your primary Cardiologist (physician) and Advanced Practice Providers (APPs -  Physician Assistants and Nurse Practitioners) who all work together to provide you with the care you need, when you need it.   Your next appointment:   08/09/2021

## 2021-07-18 ENCOUNTER — Ambulatory Visit (INDEPENDENT_AMBULATORY_CARE_PROVIDER_SITE_OTHER): Payer: Medicare PPO

## 2021-07-18 DIAGNOSIS — I255 Ischemic cardiomyopathy: Secondary | ICD-10-CM

## 2021-07-18 LAB — CUP PACEART REMOTE DEVICE CHECK
Battery Remaining Longevity: 49 mo
Battery Remaining Percentage: 55 %
Battery Voltage: 2.95 V
Brady Statistic AP VP Percent: 86 %
Brady Statistic AP VS Percent: 2 %
Brady Statistic AS VP Percent: 2.8 %
Brady Statistic AS VS Percent: 1 %
Brady Statistic RA Percent Paced: 75 %
Date Time Interrogation Session: 20230206020017
HighPow Impedance: 70 Ohm
HighPow Impedance: 70 Ohm
Implantable Lead Implant Date: 20200205
Implantable Lead Implant Date: 20200205
Implantable Lead Implant Date: 20200205
Implantable Lead Location: 753858
Implantable Lead Location: 753859
Implantable Lead Location: 753860
Implantable Pulse Generator Implant Date: 20200205
Lead Channel Impedance Value: 1000 Ohm
Lead Channel Impedance Value: 440 Ohm
Lead Channel Impedance Value: 510 Ohm
Lead Channel Pacing Threshold Amplitude: 0.625 V
Lead Channel Pacing Threshold Amplitude: 1.25 V
Lead Channel Pacing Threshold Amplitude: 1.75 V
Lead Channel Pacing Threshold Pulse Width: 0.5 ms
Lead Channel Pacing Threshold Pulse Width: 0.5 ms
Lead Channel Pacing Threshold Pulse Width: 0.5 ms
Lead Channel Sensing Intrinsic Amplitude: 12 mV
Lead Channel Sensing Intrinsic Amplitude: 4.7 mV
Lead Channel Setting Pacing Amplitude: 2 V
Lead Channel Setting Pacing Amplitude: 2.5 V
Lead Channel Setting Pacing Amplitude: 3.25 V
Lead Channel Setting Pacing Pulse Width: 0.5 ms
Lead Channel Setting Pacing Pulse Width: 0.5 ms
Lead Channel Setting Sensing Sensitivity: 0.5 mV
Pulse Gen Serial Number: 9880571

## 2021-07-20 NOTE — Progress Notes (Signed)
Remote ICD transmission.   

## 2021-07-21 LAB — BASIC METABOLIC PANEL
BUN/Creatinine Ratio: 12 (ref 10–24)
BUN: 19 mg/dL (ref 8–27)
CO2: 24 mmol/L (ref 20–29)
Calcium: 10.7 mg/dL — ABNORMAL HIGH (ref 8.6–10.2)
Chloride: 106 mmol/L (ref 96–106)
Creatinine, Ser: 1.55 mg/dL — ABNORMAL HIGH (ref 0.76–1.27)
Glucose: 138 mg/dL — ABNORMAL HIGH (ref 70–99)
Potassium: 4.7 mmol/L (ref 3.5–5.2)
Sodium: 144 mmol/L (ref 134–144)
eGFR: 44 mL/min/{1.73_m2} — ABNORMAL LOW (ref >59)

## 2021-07-21 LAB — PRO B NATRIURETIC PEPTIDE: NT-Pro BNP: 1863 pg/mL — ABNORMAL HIGH (ref 0–486)

## 2021-07-22 ENCOUNTER — Other Ambulatory Visit: Payer: Self-pay | Admitting: Cardiology

## 2021-07-25 ENCOUNTER — Ambulatory Visit (INDEPENDENT_AMBULATORY_CARE_PROVIDER_SITE_OTHER): Payer: 59

## 2021-07-25 DIAGNOSIS — I5020 Unspecified systolic (congestive) heart failure: Secondary | ICD-10-CM | POA: Diagnosis not present

## 2021-07-25 DIAGNOSIS — Z95810 Presence of automatic (implantable) cardiac defibrillator: Secondary | ICD-10-CM | POA: Diagnosis not present

## 2021-07-27 NOTE — Progress Notes (Signed)
EPIC Encounter for ICM Monitoring  Patient Name: Allen [name] is a 88 y.o. male Date: 07/27/2021 Primary Care Physican: [name], MD Primary Cardiologist: Diona Browner Electrophysiologist: Allred Bi-V Pacing: 90%     07/27/2021 Weight: 174 lbs   AT/AF Burden <1%           Spoke with patient and heart failure questions reviewed.  Pt asymptomatic for fluid accumulation.  Reports feeling well at this time and voices no complaints.  He is planning on having some dental work done in the future.  Discussed updating DPR at 2/28 OV.         CorVue thoracic impedance suggesting normal fluid levels.  Pt has Barostim.   Prescribed:  Furosemide 20 mg Take 1 tablet by mouth daily as needed. Weight gain of 2-3 lbs in a 24 hour period or 5 lbs in one week.  Pt reports 02/28/21 he is taking Furosemide daily instead of PRN. Jardiance 10 mg take 1 tablet before breakfast   Labs: 07/15/2021 Creatinine 1.55, BUN 19, Potassium 4.7, Sodium 144 05/24/2021 Creatinine 1.68, BUN 21, Potassium 5.1, Sodium 144, GFR 40 01/13/2021 Creatinine 1.64, BUN 16, Potassium 4.8, Sodium 145 12/29/2020 Creatinine 2.02, BUN 24, Potassium 4.7, Sodium 142, GFR 32  10/13/2020 Creatinine 1.61, BUN 16, Potassium 4.8, Sodium 144 07/08/2020 Creatinine 1.46, BUN 20, Potassium 4.7, Sodium 142, GFR 44-50  A complete set of results can be found in Results Review.   Recommendations: No changes and encouraged to call if experiencing any fluid symptoms.   Follow-up plan: ICM clinic phone appointment on 08/29/2021.   91 day device clinic remote transmission 10/17/2021.     EP/Cardiology Office Visits: 12/27/2021 with Dr. Diona Browner.  08/09/2021 with [name], PA.     Copy of ICM check sent to Dr. Johney Frame.  3 month ICM trend: 07/25/2021.    12-14 Month ICM trend:     [name], RN 07/27/2021 10:16 AM

## 2021-08-09 ENCOUNTER — Ambulatory Visit (INDEPENDENT_AMBULATORY_CARE_PROVIDER_SITE_OTHER): Payer: 59 | Admitting: Student

## 2021-08-09 ENCOUNTER — Encounter: Payer: Self-pay | Admitting: Student

## 2021-08-09 ENCOUNTER — Other Ambulatory Visit: Payer: Self-pay

## 2021-08-09 VITALS — BP 118/64 | HR 55 | Ht 62.0 in | Wt 173.2 lb

## 2021-08-09 DIAGNOSIS — I447 Left bundle-branch block, unspecified: Secondary | ICD-10-CM

## 2021-08-09 DIAGNOSIS — R001 Bradycardia, unspecified: Secondary | ICD-10-CM | POA: Diagnosis not present

## 2021-08-09 DIAGNOSIS — I5020 Unspecified systolic (congestive) heart failure: Secondary | ICD-10-CM

## 2021-08-09 DIAGNOSIS — Z95810 Presence of automatic (implantable) cardiac defibrillator: Secondary | ICD-10-CM | POA: Diagnosis not present

## 2021-08-09 LAB — CUP PACEART INCLINIC DEVICE CHECK
Battery Remaining Longevity: 45 mo
Brady Statistic RA Percent Paced: 77 %
Brady Statistic RV Percent Paced: 90 %
Date Time Interrogation Session: 20230228124439
HighPow Impedance: 66.375
Implantable Lead Implant Date: 20200205
Implantable Lead Implant Date: 20200205
Implantable Lead Implant Date: 20200205
Implantable Lead Location: 753858
Implantable Lead Location: 753859
Implantable Lead Location: 753860
Implantable Pulse Generator Implant Date: 20200205
Lead Channel Impedance Value: 1062.5 Ohm
Lead Channel Impedance Value: 462.5 Ohm
Lead Channel Impedance Value: 525 Ohm
Lead Channel Pacing Threshold Amplitude: 0.625 V
Lead Channel Pacing Threshold Amplitude: 1 V
Lead Channel Pacing Threshold Amplitude: 1 V
Lead Channel Pacing Threshold Amplitude: 1.25 V
Lead Channel Pacing Threshold Amplitude: 1.25 V
Lead Channel Pacing Threshold Pulse Width: 0.5 ms
Lead Channel Pacing Threshold Pulse Width: 0.5 ms
Lead Channel Pacing Threshold Pulse Width: 0.5 ms
Lead Channel Pacing Threshold Pulse Width: 0.5 ms
Lead Channel Pacing Threshold Pulse Width: 0.5 ms
Lead Channel Sensing Intrinsic Amplitude: 12 mV
Lead Channel Sensing Intrinsic Amplitude: 3.2 mV
Lead Channel Setting Pacing Amplitude: 2 V
Lead Channel Setting Pacing Amplitude: 2 V
Lead Channel Setting Pacing Amplitude: 2.5 V
Lead Channel Setting Pacing Pulse Width: 0.5 ms
Lead Channel Setting Pacing Pulse Width: 0.5 ms
Lead Channel Setting Sensing Sensitivity: 0.5 mV
Pulse Gen Serial Number: 9880571

## 2021-08-09 NOTE — Progress Notes (Signed)
Electrophysiology Office Note Date: 08/09/2021  ID:  Allen [name], DOB 01-01-1934, MRN [id number]  PCP: [name], MD Primary Cardiologist: [name], MD Electrophysiologist: Hillis Range, MD   CC: Barostim titration   Allen [name] is a 88 y.o. male seen today for barostim titration.   At last visit he was programmed from 3.4 to 6.0 ma without stim.   Symptomatically; He has not noticed much of a difference. Remains fatigued and SOB with moderate exertion. He is excited about the prospect of spring and fishing.  He is prescribed continuous O2, but at times tries to "go without it".   The patients goals for barostim titration are to fish without fatigue or SOB, do more household chores, and start walking again.    Device History: Barostim (standard) implanted 05/27/2021 for Chronic systolic CHF St. Jude BiV ICD implanted 07/17/2018 for ICM, NYHA III, SSS, LBBB History of appropriate therapy: No History of AAD therapy: No    Past Medical History:  Diagnosis Date   AICD (automatic cardioverter/defibrillator) present    Biventricular ICD (implantable cardioverter-defibrillator) in place 07/17/2018   CAD (coronary artery disease)    a. 04/2016: s/p CABG with LIMA-LAD, SVG-D1, SVG-RCA, and seq SVG-OM1-OM2   CHF (congestive heart failure) (HCC)    CKD (chronic kidney disease)    Essential hypertension    Hyperlipidemia    Ischemic cardiomyopathy    LVEF 35% May 2016   LBBB (left bundle branch block)    Pneumonia due to COVID-19 virus    Sinus bradycardia    Type 2 diabetes mellitus (HCC)    Past Surgical History:  Procedure Laterality Date   BIV ICD INSERTION CRT-D  07/17/2018   BIV ICD INSERTION CRT-D N/A 07/17/2018   Procedure: BIV ICD INSERTION CRT-D;  Surgeon: Hillis Range, MD;  Location: [facility] INVASIVE CV LAB;  Service: Cardiovascular;  Laterality: N/A;   CARDIAC CATHETERIZATION N/A 11/02/2014   Procedure: Left Heart Cath and Coronary Angiography;   Surgeon: Lyn Records, MD;  Location: [facility] INVASIVE CV LAB;  Service: Cardiovascular;  Laterality: N/A;   CARDIAC CATHETERIZATION N/A 05/03/2016   Procedure: Left Heart Cath and Coronary Angiography;  Surgeon: Peter M Swaziland, MD;  Location: [facility] INVASIVE CV LAB;  Service: Cardiovascular;  Laterality: N/A;   CORONARY ARTERY BYPASS GRAFT N/A 05/08/2016   Procedure: CORONARY ARTERY BYPASS GRAFTING (CABG) x 5;  Surgeon: Alleen Borne, MD;  Location: [facility] OR;  Service: Open Heart Surgery;  Laterality: N/A;   ENDOVEIN HARVEST OF GREATER SAPHENOUS VEIN  05/08/2016   Procedure: ENDOVEIN HARVEST OF GREATER SAPHENOUS VEIN;  Surgeon: Alleen Borne, MD;  Location: [facility] OR;  Service: Open Heart Surgery;;   TEE WITHOUT CARDIOVERSION N/A 05/08/2016   Procedure: TRANSESOPHAGEAL ECHOCARDIOGRAM (TEE);  Surgeon: Alleen Borne, MD;  Location: [facility] OR;  Service: Open Heart Surgery;  Laterality: N/A;    Current Outpatient Medications  Medication Sig Dispense Refill   aspirin 81 MG EC tablet Take 81 mg by mouth daily.     atorvastatin (LIPITOR) 80 MG tablet Take 1 tablet (80 mg total) by mouth daily at 6 PM. 90 tablet 3   carvedilol (COREG) 6.25 MG tablet TAKE 1 TABLET(6.25 MG) BY MOUTH TWICE DAILY WITH A MEAL 180 tablet 1   clopidogrel (PLAVIX) 75 MG tablet TAKE 1 TABLET EVERY DAY WITH BREAKFAST 90 tablet 1   ENTRESTO 49-51 MG TAKE 1 TABLET TWICE DAILY 180 tablet 1   furosemide (LASIX) 20 MG tablet  Take 20 mg by mouth daily as needed (Weight gain of 2-3 lbs in a 24 hour period or 5 lbs in one week).     HYDROcodone-acetaminophen (NORCO/VICODIN) 5-325 MG tablet Take 1 tablet by mouth 3 (three) times daily as needed.     ibuprofen (ADVIL) 800 MG tablet Take 800 mg by mouth every 6 (six) hours as needed.     Insulin Glargine (LANTUS SOLOSTAR) 100 UNIT/ML Solostar Pen Inject 20 Units into the skin at bedtime. 15 mL 11   ipratropium (ATROVENT) 0.03 % nasal spray Place into both nostrils.     JARDIANCE 10 MG TABS tablet TAKE 1  TABLET(10 MG) BY MOUTH DAILY BEFORE BREAKFAST 30 tablet 6   mirtazapine (REMERON) 15 MG tablet Take 15 mg by mouth at bedtime.     nitroGLYCERIN (NITROSTAT) 0.4 MG SL tablet Place 0.4 mg under the tongue every 5 (five) minutes as needed for chest pain (If no relief after 3rd dose, proceed to the ED).     oxyCODONE-acetaminophen (PERCOCET/ROXICET) 5-325 MG tablet Take 1 tablet by mouth every 6 (six) hours as needed. 20 tablet 0   oxyCODONE-acetaminophen (PERCOCET/ROXICET) 5-325 MG tablet Take 1 tablet by mouth every 6 (six) hours as needed. 20 tablet 0   OXYGEN Inhale 3 L into the lungs continuous. 3 lpm 24/7 DME- [facility]     repaglinide (PRANDIN) 2 MG tablet Take 2 mg by mouth 2 (two) times daily before a meal.     vitamin B-12 (CYANOCOBALAMIN) 1000 MCG tablet Take 1,000 mcg by mouth daily.     No current facility-administered medications for this visit.    Allergies:   Patient has no known allergies.   Social History: Social History   Socioeconomic History   Marital status: Married    Spouse name: Not on file   Number of children: Not on file   Years of education: Not on file   Highest education level: Not on file  Occupational History   Occupation: [name]    Comment: Retired  Tobacco Use   Smoking status: Former    Packs/day: 0.50    Years: 56.00    Pack years: 28.00    Types: Cigarettes    Start date: 02/18/1956    Quit date: 06/13/2011    Years since quitting: 10.1   Smokeless tobacco: Never  Vaping Use   Vaping Use: Never used  Substance and Sexual Activity   Alcohol use: No    Alcohol/week: 0.0 standard drinks   Drug use: No   Sexual activity: Never  Other Topics Concern   Not on file  Social History Narrative   Retired Naval architect, 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  Physical Exam: Vitals:   08/09/21 1024  BP: 118/64  Pulse: (!) 55  SpO2: 90%  Weight: 173 lb 3.2 oz (78.6 kg)  Height: 5\' 2"  (  1.575 m)   Wt Readings from Last 3 Encounters:  08/09/21 173 lb 3.2 oz (78.6 kg)  07/15/21 174 lb 12.8 oz (79.3 kg)  06/27/21 175 lb 3.2 oz (79.5 kg)      General: Pleasant, NAD. No resp difficulty Psych: Normal affect. HEENT:  Normal, without mass or lesion.         Neck: Supple, no bruits or JVD. Carotids 2+. No lymphadenopathy/thyromegaly appreciated. Heart: PMI nondisplaced. RRR no s3, s4, or murmurs. Lungs:  Resp regular and unlabored, CTA. Abdomen: Soft, non-tender, non-distended, No HSM, BS + x 4.   Extremities: No clubbing, cyanosis or edema. DP/PT/Radials 2+ and equal bilaterally. Neuro: Alert and oriented X 3. Moves all extremities spontaneously.   Barostim Interrogation- Performed personally and reviewed in detail today,  See scanned report  EKG:  EKG is not ordered today.  Recent Labs: 05/24/2021: ALT 18; Hemoglobin 17.7; Platelets 223 07/15/2021: BUN 19; Creatinine, Ser 1.55; NT-Pro BNP 1,863; Potassium 4.7; Sodium 144   Wt Readings from Last 3 Encounters:  08/09/21 173 lb 3.2 oz (78.6 kg)  07/15/21 174 lb 12.8 oz (79.3 kg)  06/27/21 175 lb 3.2 oz (79.5 kg)     Other studies Reviewed: Additional studies/ records that were reviewed today include: Previous EP notes  Assessment and Plan:  1. Chronic systolic CHF s/p St. Jude and Barostim implantation NYHA III symptoms.   Device titrated from 6.0 millamp to 9.0 milliamp by increments of 0.4 with stable BP. He had stimulation at 10 described  as a fullness that made him need to cough.  Device impedence stable Pt goals are to fish, walk, and chore.  See scanned report. S/p AV optimization 10/27/2019. He felt somewhat better and has maintained this. V-V optimization 02/2020. QRS 176 > 122 ms after reprogramming with positive/RBBB V1 and biphasic lead 1. Previous optimization imaging was reviewed and felt appropriate to increase PAV delay at this time, with intrinsic A to R of 300 ms. Atrial threshold stable with manual testing.  Threshold had been driven up by auto capture and AMS with testing.  I turned this to "OFF" today.  Encouraged more judicious use of his prn lasix.  BNP today.   2. Sinus bradycardia Stable post CRT    3. HTN Stable on current regimen    4. Vertiginous dizziness Encouraged  PCP follow up.   5. Chronic hypoxic respiratory failure  Current medicines are reviewed at length with the patient today.   The patient does not have concerns regarding his medicines.  The following changes were made today:  none   Disposition:   Follow up with EP APP in 2-4 Weeks for further barostim titration.   Dustin Flock, PA-C  08/09/2021 10:39 AM  [facility] [address] [place] [address] [phone] (office) [phone] (fax)

## 2021-08-09 NOTE — Patient Instructions (Signed)
Medication Instructions:  Your physician recommends that you continue on your current medications as directed. Please refer to the Current Medication list given to you today.  *If you need a refill on your cardiac medications before your next appointment, please call your pharmacy*   Lab Work: TODAY: BMET, BNP  If you have labs (blood work) drawn today and your tests are completely normal, you will receive your results only by: MyChart Message (if you have MyChart) OR A paper copy in the mail If you have any lab test that is abnormal or we need to change your treatment, we will call you to review the results.   Follow-Up: At [facility], you and your health needs are our priority.  As part of our continuing mission to provide you with exceptional heart care, we have created designated Provider Care Teams.  These Care Teams include your primary Cardiologist (physician) and Advanced Practice Providers (APPs -  Physician Assistants and Nurse Practitioners) who all work together to provide you with the care you need, when you need it.    Your next appointment:   As scheduled 

## 2021-08-10 LAB — BASIC METABOLIC PANEL
BUN/Creatinine Ratio: 13 (ref 10–24)
BUN: 21 mg/dL (ref 8–27)
CO2: 23 mmol/L (ref 20–29)
Calcium: 10.1 mg/dL (ref 8.6–10.2)
Chloride: 110 mmol/L — ABNORMAL HIGH (ref 96–106)
Creatinine, Ser: 1.62 mg/dL — ABNORMAL HIGH (ref 0.76–1.27)
Glucose: 137 mg/dL — ABNORMAL HIGH (ref 70–99)
Potassium: 5.2 mmol/L (ref 3.5–5.2)
Sodium: 145 mmol/L — ABNORMAL HIGH (ref 134–144)
eGFR: 41 mL/min/{1.73_m2} — ABNORMAL LOW (ref >59)

## 2021-08-10 LAB — PRO B NATRIURETIC PEPTIDE: NT-Pro BNP: 1113 pg/mL — ABNORMAL HIGH (ref 0–486)

## 2021-09-01 ENCOUNTER — Telehealth: Payer: Self-pay

## 2021-09-01 NOTE — Telephone Encounter (Signed)
I spoke with the patient and he agreed to send missed ICM transmission when he get home. ?

## 2021-09-02 NOTE — Progress Notes (Signed)
No ICM remote transmission received for 08/29/2021 and next ICM transmission scheduled for 09/26/2021.   ?

## 2021-09-16 NOTE — Progress Notes (Signed)
? ? ? ?Electrophysiology Office Note ?Date: 09/21/2021 ? ?ID:  Allen [name], DOB 07/13/1933, MRN [id number] ? ?PCP: Toma DeitersHasanaj, Xaje A, MD ?Primary Cardiologist: Nona DellSamuel McDowell, MD ?Electrophysiologist: Hillis RangeJames Allred, MD  ? ?CC: Barostim titration  ? ?Allen [name] is a 88 y.o. male seen today  for barostim titration.  ? ?At last visit he was programmed from 6.0 to 9.0 ma. He had stimulation at 10 ma described as an urge to cough.  ? ?Since then, pt continues to not notice much of a difference. He continues to not use his O2 until he gets SOB. He usually gets around the house and does ADLs without difficulty. Can push a cart around a grocery store if a motorized one is not available.   ? ?The patients goals for barostim titration are to fish without fatigue or SOB, do more household chores, and start walking again.   ? ?Device History: ?Barostim (standard) implanted 05/27/2021 for Chronic systolic CHF ?St. Jude BiV ICD implanted 07/17/2018 for ICM, NYHA III, SSS, LBBB ?History of appropriate therapy: No ?History of AAD therapy: No  ? ? ?Past Medical History:  ?Diagnosis Date  ? AICD (automatic cardioverter/defibrillator) present   ? Biventricular ICD (implantable cardioverter-defibrillator) in place 07/17/2018  ? CAD (coronary artery disease)   ? a. 04/2016: s/p CABG with LIMA-LAD, SVG-D1, SVG-RCA, and seq SVG-OM1-OM2  ? CHF (congestive heart failure) (HCC)   ? CKD (chronic kidney disease)   ? Essential hypertension   ? Hyperlipidemia   ? Ischemic cardiomyopathy   ? LVEF 35% May 2016  ? LBBB (left bundle branch block)   ? Pneumonia due to COVID-19 virus   ? Sinus bradycardia   ? Type 2 diabetes mellitus (HCC)   ? ?Past Surgical History:  ?Procedure Laterality Date  ? BIV ICD INSERTION CRT-D  07/17/2018  ? BIV ICD INSERTION CRT-D N/A 07/17/2018  ? Procedure: BIV ICD INSERTION CRT-D;  Surgeon: Hillis RangeAllred, James, MD;  Location: [facility];  Service: Cardiovascular;  Laterality: N/A;  ? CARDIAC CATHETERIZATION N/A  11/02/2014  ? Procedure: Left Heart Cath and Coronary Angiography;  Surgeon: Lyn RecordsHenry W Smith, MD;  [facility];  Service: Cardiovascular;  Laterality: N/A;  ? CARDIAC CATHETERIZATION N/A 05/03/2016  ? Procedure: Left Heart Cath and Coronary Angiography;  Surgeon: Peter M SwazilandJordan, MD;  [facility];  Service: Cardiovascular;  Laterality: N/A;  ? CORONARY ARTERY BYPASS GRAFT N/A 05/08/2016  ? Procedure: CORONARY ARTERY BYPASS GRAFTING (CABG) x 5;  Surgeon: Alleen BorneBryan K Bartle, MD;  Location: [facility] OR;  Service: Open Heart Surgery;  Laterality: N/A;  ? ENDOVEIN HARVEST OF GREATER SAPHENOUS VEIN  05/08/2016  ? Procedure: ENDOVEIN HARVEST OF GREATER SAPHENOUS VEIN;  Surgeon: Alleen BorneBryan K Bartle, MD;  Location: [facility] OR;  Service: Open Heart Surgery;;  ? TEE WITHOUT CARDIOVERSION N/A 05/08/2016  ? Procedure: TRANSESOPHAGEAL ECHOCARDIOGRAM (TEE);  Surgeon: Alleen BorneBryan K Bartle, MD;  [facility];  Service: Open Heart Surgery;  Laterality: N/A;  ? ? ?Current Outpatient Medications  ?Medication Sig Dispense Refill  ? aspirin 81 MG EC tablet Take 81 mg by mouth daily.    ? atorvastatin (LIPITOR) 80 MG tablet Take 1 tablet (80 mg total) by mouth daily at 6 PM. 90 tablet 3  ? carvedilol (COREG) 6.25 MG tablet TAKE 1 TABLET(6.25 MG) BY MOUTH TWICE DAILY WITH A MEAL 180 tablet 1  ? clopidogrel (PLAVIX) 75 MG tablet TAKE 1 TABLET EVERY DAY WITH BREAKFAST 90 tablet 1  ?  ENTRESTO 49-51 MG TAKE 1 TABLET TWICE DAILY 180 tablet 1  ? furosemide (LASIX) 20 MG tablet Take 20 mg by mouth daily as needed (Weight gain of 2-3 lbs in a 24 hour period or 5 lbs in one week).    ? ibuprofen (ADVIL) 800 MG tablet Take 800 mg by mouth every 6 (six) hours as needed.    ? Insulin Glargine (LANTUS SOLOSTAR) 100 UNIT/ML Solostar Pen Inject 20 Units into the skin at bedtime. 15 mL 11  ? ipratropium (ATROVENT) 0.03 % nasal spray Place into both nostrils.    ? JARDIANCE 10 MG TABS tablet TAKE 1 TABLET(10 MG) BY MOUTH DAILY BEFORE BREAKFAST 30  tablet 6  ? mirtazapine (REMERON) 15 MG tablet Take 15 mg by mouth at bedtime.    ? nitroGLYCERIN (NITROSTAT) 0.4 MG SL tablet Place 0.4 mg under the tongue every 5 (five) minutes as needed for chest pain (If no relief after 3rd dose, proceed to the ED).    ? OXYGEN Inhale 3 L into the lungs continuous. 3 lpm 24/7 ?DME- [facility]    ? repaglinide (PRANDIN) 2 MG tablet Take 2 mg by mouth 2 (two) times daily before a meal.    ? vitamin B-12 (CYANOCOBALAMIN) 1000 MCG tablet Take 1,000 mcg by mouth daily.    ? HYDROcodone-acetaminophen (NORCO/VICODIN) 5-325 MG tablet Take 1 tablet by mouth 3 (three) times daily as needed. (Patient not taking: Reported on 09/21/2021)    ? oxyCODONE-acetaminophen (PERCOCET/ROXICET) 5-325 MG tablet Take 1 tablet by mouth every 6 (six) hours as needed. (Patient not taking: Reported on 09/21/2021) 20 tablet 0  ? oxyCODONE-acetaminophen (PERCOCET/ROXICET) 5-325 MG tablet Take 1 tablet by mouth every 6 (six) hours as needed. (Patient not taking: Reported on 09/21/2021) 20 tablet 0  ? ?No current facility-administered medications for this visit.  ? ? ?Allergies:   Patient has no known allergies.  ? ?Social History: ?Social History  ? ?Socioeconomic History  ? Marital status: Married  ?  Spouse name: Not on file  ? Number of children: Not on file  ? Years of education: Not on file  ? Highest education level: Not on file  ?Occupational History  ? Occupation: [name]  ?  Comment: Retired  ?Tobacco Use  ? Smoking status: Former  ?  Packs/day: 0.50  ?  Years: 56.00  ?  Pack years: 28.00  ?  Types: Cigarettes  ?  Start date: 02/18/1956  ?  Quit date: 06/13/2011  ?  Years since quitting: 10.2  ? Smokeless tobacco: Never  ?Vaping Use  ? Vaping Use: Never used  ?Substance and Sexual Activity  ? Alcohol use: No  ?  Alcohol/week: 0.0 standard drinks  ? Drug use: No  ? Sexual activity: Never  ?Other Topics Concern  ? Not on file  ?Social History Narrative  ? 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 ?Social Determinants of Health  ? ?Financial Resource Strain: Not on file  ?Food Insecurity: Not on file  ?Transportation Needs: Not on file  ?Physical Activity: Not on file  ?Stress: Not on file  ?Social Connections: Not on file  ?Intimate Partner Violence: Not on file  ? ? ?Family History: ?Family History  ?Problem Relation Age of Onset  ? Unexplained death Father   ? Unexplained death Mother   ?     Old age  ? Coronary artery disease Brother 84  ? ? ?Review of systems complete and found to be negative unless  listed in HPI.   ? ?Physical Exam: ?Vitals:  ? 09/21/21 0951  ?BP: 120/82  ?Pulse: (!) 47  ?SpO2: 91%  ?Weight: 174 lb 9.6 oz (79.2 kg)  ?Height: 5\' 8"  (1.727 m)  ? ? ?Wt Readings from Last 3 Encounters:  ?09/21/21 174 lb 9.6 oz (79.2 kg)  ?08/09/21 173 lb 3.2 oz (78.6 kg)  ?07/15/21 174 lb 12.8 oz (79.3 kg)  ? ?General: Pleasant, NAD. No resp difficulty ?Psych: Normal affect. ?HEENT:  Normal, without mass or lesion.         ?Neck: Supple, no bruits or JVD. Carotids 2+. No lymphadenopathy/thyromegaly appreciated. ?Heart: PMI nondisplaced. RRR no s3, s4, or murmurs. ?Lungs:  Resp regular and unlabored, CTA. ?Abdomen: Soft, non-tender, non-distended, No HSM, BS + x 4.   ?Extremities: No clubbing, cyanosis or edema. DP/PT/Radials 2+ and equal bilaterally. ?Neuro: Alert and oriented X 3. Moves all extremities spontaneously.  ? ?09/12/21- Performed personally and reviewed in detail today,  See scanned report ? ?EKG:  EKG is not ordered today. ? ?Recent Labs: ?05/24/2021: ALT 18; Hemoglobin 17.7; Platelets 223 ?08/09/2021: BUN 21; Creatinine, Ser 1.62; NT-Pro BNP 1,113; Potassium 5.2; Sodium 145  ? ?Wt Readings from Last 3 Encounters:  ?09/21/21 174 lb 9.6 oz (79.2 kg)  ?08/09/21 173 lb 3.2 oz (78.6 kg)  ?07/15/21 174 lb 12.8 oz (79.3 kg)  ?  ? ?Other studies Reviewed: ?Additional studies/ records that were reviewed today include: Previous EP notes ? ?Assessment and Plan: ? ?1. Chronic systolic CHF  s/p St. Jude and Barostim implantation ?NYHA II-III symptoms.   ?Will consider 9.0 final programming given stim at higher level.  ?Device impedence stable.  ?Pt goals are to fish, walk, and chore.  ?See scanned rep

## 2021-09-21 ENCOUNTER — Encounter: Payer: Self-pay | Admitting: Student

## 2021-09-21 ENCOUNTER — Ambulatory Visit (INDEPENDENT_AMBULATORY_CARE_PROVIDER_SITE_OTHER): Payer: 59 | Admitting: Student

## 2021-09-21 VITALS — BP 120/82 | HR 47 | Ht 68.0 in | Wt 174.6 lb

## 2021-09-21 DIAGNOSIS — R001 Bradycardia, unspecified: Secondary | ICD-10-CM | POA: Diagnosis not present

## 2021-09-21 DIAGNOSIS — I5020 Unspecified systolic (congestive) heart failure: Secondary | ICD-10-CM | POA: Diagnosis not present

## 2021-09-21 DIAGNOSIS — R42 Dizziness and giddiness: Secondary | ICD-10-CM

## 2021-09-21 NOTE — Patient Instructions (Signed)
Medication Instructions:  ?Your physician recommends that you continue on your current medications as directed. Please refer to the Current Medication list given to you today. ? ?*If you need a refill on your cardiac medications before your next appointment, please call your pharmacy* ? ? ?Lab Work: ?TODAY: BMET, BNP ? ?If you have labs (blood work) drawn today and your tests are completely normal, you will receive your results only by: ?MyChart Message (if you have MyChart) OR ?A paper copy in the mail ?If you have any lab test that is abnormal or we need to change your treatment, we will call you to review the results. ? ? ?Follow-Up: ?At [facility], you and your health needs are our priority.  As part of our continuing mission to provide you with exceptional heart care, we have created designated Provider Care Teams.  These Care Teams include your primary Cardiologist (physician) and Advanced Practice Providers (APPs -  Physician Assistants and Nurse Practitioners) who all work together to provide you with the care you need, when you need it. ? ?We recommend signing up for the patient portal called "MyChart".  Sign up information is provided on this After Visit Summary.  MyChart is used to connect with patients for Virtual Visits (Telemedicine).  Patients are able to view lab/test results, encounter notes, upcoming appointments, etc.  Non-urgent messages can be sent to your provider as well.   ?To learn more about what you can do with MyChart, go to [email or web address].   ? ?Your next appointment:   ?11/30/2021 ? ?Important Information About Sugar ? ? ? ? ?  ?

## 2021-09-22 LAB — BASIC METABOLIC PANEL
BUN/Creatinine Ratio: 10 (ref 10–24)
BUN: 17 mg/dL (ref 8–27)
CO2: 23 mmol/L (ref 20–29)
Calcium: 10.5 mg/dL — ABNORMAL HIGH (ref 8.6–10.2)
Chloride: 107 mmol/L — ABNORMAL HIGH (ref 96–106)
Creatinine, Ser: 1.7 mg/dL — ABNORMAL HIGH (ref 0.76–1.27)
Glucose: 136 mg/dL — ABNORMAL HIGH (ref 70–99)
Potassium: 4.8 mmol/L (ref 3.5–5.2)
Sodium: 143 mmol/L (ref 134–144)
eGFR: 39 mL/min/{1.73_m2} — ABNORMAL LOW (ref >59)

## 2021-09-22 LAB — PRO B NATRIURETIC PEPTIDE: NT-Pro BNP: 691 pg/mL — ABNORMAL HIGH (ref 0–486)

## 2021-09-26 ENCOUNTER — Ambulatory Visit (INDEPENDENT_AMBULATORY_CARE_PROVIDER_SITE_OTHER): Payer: 59

## 2021-09-26 DIAGNOSIS — I5022 Chronic systolic (congestive) heart failure: Secondary | ICD-10-CM | POA: Diagnosis not present

## 2021-09-26 DIAGNOSIS — Z95810 Presence of automatic (implantable) cardiac defibrillator: Secondary | ICD-10-CM | POA: Diagnosis not present

## 2021-09-26 NOTE — Progress Notes (Signed)
EPIC Encounter for ICM Monitoring ? ?Patient Name: Allen [name] is a 88 y.o. male ?Date: 09/26/2021 ?Primary Care Physican: [name], MD ?Primary Cardiologist: Diona Browner ?Electrophysiologist: Allred ?Bi-V Pacing: 89%     ?09/21/2021 Weight: 174 lbs ?  ?AT/AF Burden <1%   ?  ?      Attempted call to patient and unable to reach.  Left message to return call. Transmission reviewed.  ?  ?     CorVue thoracic impedance suggesting possible fluid accumulation starting 4/10 and returned close to baseline normal.  Pt has Barostim. ?  ?Prescribed:  ?Furosemide 20 mg Take 1 tablet by mouth daily as needed. Weight gain of 2-3 lbs in a 24 hour period or 5 lbs in one week.  Pt reports 02/28/21 he is taking Furosemide daily instead of PRN. ?Jardiance 10 mg take 1 tablet before breakfast ?  ?Labs: ?09/21/2021 Creatinine 1.62, BUN 21, Potassium 5.2, Sodium 145, GFR 41 ?08/09/2021 Creatinine 1.55, BUN 19, Potassium 4.7, Sodium 144, GFR 44 ?07/15/2021 Creatinine 1.55, BUN 19, Potassium 4.7, Sodium 144 ?05/24/2021 Creatinine 1.68, BUN 21, Potassium 5.1, Sodium 144, GFR 40 ?01/13/2021 Creatinine 1.64, BUN 16, Potassium 4.8, Sodium 145 ?12/29/2020 Creatinine 2.02, BUN 24, Potassium 4.7, Sodium 142, GFR 32  ?10/13/2020 Creatinine 1.61, BUN 16, Potassium 4.8, Sodium 144 ?07/08/2020 Creatinine 1.46, BUN 20, Potassium 4.7, Sodium 142, GFR 44-50  ?A complete set of results can be found in Results Review. ?  ?Recommendations: Unable to reach.   ?  ?Follow-up plan: ICM clinic phone appointment on 10/03/2021 to recheck fluid levels.   91 day device clinic remote transmission 10/17/2021.   ?  ?EP/Cardiology Office Visits: 12/27/2021 with Dr. Diona Browner.  11/30/2021 with [name], PA.   ?  ?Copy of ICM check sent to Dr. Johney Frame. ? ?3 month ICM trend: 09/26/2021. ? ? ? ?12-14 Month ICM trend:  ? ? ? ?[name], RN ?09/26/2021 ?7:51 AM ? ?

## 2021-09-27 ENCOUNTER — Telehealth: Payer: Self-pay

## 2021-09-27 NOTE — Telephone Encounter (Signed)
Remote ICM transmission received.  Attempted call to patient regarding ICM remote transmission and left message per DPR to return call.   

## 2021-10-03 ENCOUNTER — Ambulatory Visit (INDEPENDENT_AMBULATORY_CARE_PROVIDER_SITE_OTHER): Payer: 59

## 2021-10-03 DIAGNOSIS — Z95810 Presence of automatic (implantable) cardiac defibrillator: Secondary | ICD-10-CM

## 2021-10-03 DIAGNOSIS — I5022 Chronic systolic (congestive) heart failure: Secondary | ICD-10-CM

## 2021-10-04 NOTE — Progress Notes (Signed)
EPIC Encounter for ICM Monitoring ? ?Patient Name: Allen [name] is a 88 y.o. male ?Date: 10/04/2021 ?Primary Care Physican: Neale Burly, MD ?Primary Cardiologist: Domenic Polite ?Electrophysiologist: Allred ?Bi-V Pacing: 90%     ?09/21/2021 Weight: 174 lbs ?10/04/2021 Weight: 174-175 lbs  ?  ?AT/AF Burden <1%   ?  ?      Spoke with patient and heart failure questions reviewed.  Pt asymptomatic for fluid accumulation, no swelling, no SOB or weight gain.  Reports feeling well at this time and voices no complaints.  Discussed diet and importance of limiting salt.  ?  ?     CorVue thoracic impedance suggesting possible fluid accumulation starting 4/10.    Pt has Barostim. ?  ?Prescribed:  ?Furosemide 20 mg Take 1 tablet by mouth daily as needed. Weight gain of 2-3 lbs in a 24 hour period or 5 lbs in one week.  Pt reports 10/04/2021 he takes Furosemide daily instead of PRN. ?Jardiance 10 mg take 1 tablet before breakfast ?  ?Labs: ?09/21/2021 Creatinine 1.62, BUN 21, Potassium 5.2, Sodium 145, GFR 41 ?08/09/2021 Creatinine 1.55, BUN 19, Potassium 4.7, Sodium 144, GFR 44 ?07/15/2021 Creatinine 1.55, BUN 19, Potassium 4.7, Sodium 144 ?05/24/2021 Creatinine 1.68, BUN 21, Potassium 5.1, Sodium 144, GFR 40 ?01/13/2021 Creatinine 1.64, BUN 16, Potassium 4.8, Sodium 145 ?12/29/2020 Creatinine 2.02, BUN 24, Potassium 4.7, Sodium 142, GFR 32  ?10/13/2020 Creatinine 1.61, BUN 16, Potassium 4.8, Sodium 144 ?07/08/2020 Creatinine 1.46, BUN 20, Potassium 4.7, Sodium 142, GFR 44-50  ?A complete set of results can be found in Results Review. ?  ?Recommendations: Advised to review food labels and limit salt intake.  Pt is already taking Furosemide 20 mg daily instead of PRN.   ?  ?Follow-up plan: ICM clinic phone appointment on 10/10/2021 to recheck fluid levels.   91 day device clinic remote transmission 10/17/2021.   ?  ?EP/Cardiology Office Visits: 12/27/2021 with Dr. Domenic Polite.  11/30/2021 with [name], PA.   ?  ?Copy of ICM check sent  to Dr. Rayann Heman and Dr Domenic Polite for review and recommendations.    ? ?3 month ICM trend: 10/03/2021. ? ? ? ?12-14 Month ICM trend:  ? ? ? ?Rosalene Billings, RN ?10/04/2021 ?12:56 PM ? ?

## 2021-10-10 ENCOUNTER — Ambulatory Visit (INDEPENDENT_AMBULATORY_CARE_PROVIDER_SITE_OTHER): Payer: 59

## 2021-10-10 DIAGNOSIS — Z95810 Presence of automatic (implantable) cardiac defibrillator: Secondary | ICD-10-CM

## 2021-10-10 DIAGNOSIS — I5022 Chronic systolic (congestive) heart failure: Secondary | ICD-10-CM

## 2021-10-12 NOTE — Progress Notes (Signed)
EPIC Encounter for ICM Monitoring ? ?Patient Name: Allen [name] is a 88 y.o. male ?Date: 10/12/2021 ?Primary Care Physican: [name], MD ?Primary Cardiologist: Diona Browner ?Electrophysiologist: Allred ?Bi-V Pacing: 90%     ?09/21/2021 Weight: 174 lbs ?10/04/2021 Weight: 174-175 lbs  ?  ?AT/AF Burden <1%   ?  ?      Spoke with patient and heart failure questions reviewed.  Pt asymptomatic for fluid accumulation.  He adjusted diet and fluid levels returned to normal. He feels like the area around device and barostim device are sore at times but denies ay open skin areas, swelling, redness or local area fever.  Advised device areas will be checked at 6/21 OV but encouraged to call back if area around device changes.   ?  ?     CorVue thoracic impedance suggesting fluid levels returned to normal after adjusting diet.    Pt has Barostim. ?  ?Prescribed:  ?Furosemide 20 mg Take 1 tablet by mouth daily as needed. Weight gain of 2-3 lbs in a 24 hour period or 5 lbs in one week.  Pt reports 10/04/2021 he takes Furosemide daily instead of PRN. ?Jardiance 10 mg take 1 tablet before breakfast ?  ?Labs: ?09/21/2021 Creatinine 1.62, BUN 21, Potassium 5.2, Sodium 145, GFR 41 ?08/09/2021 Creatinine 1.55, BUN 19, Potassium 4.7, Sodium 144, GFR 44 ?07/15/2021 Creatinine 1.55, BUN 19, Potassium 4.7, Sodium 144 ?05/24/2021 Creatinine 1.68, BUN 21, Potassium 5.1, Sodium 144, GFR 40 ?01/13/2021 Creatinine 1.64, BUN 16, Potassium 4.8, Sodium 145 ?12/29/2020 Creatinine 2.02, BUN 24, Potassium 4.7, Sodium 142, GFR 32  ?10/13/2020 Creatinine 1.61, BUN 16, Potassium 4.8, Sodium 144 ?07/08/2020 Creatinine 1.46, BUN 20, Potassium 4.7, Sodium 142, GFR 44-50  ?A complete set of results can be found in Results Review. ?  ?Recommendations: No changes and encouraged to call if experiencing any fluid symptoms. ?  ?Follow-up plan: ICM clinic phone appointment on 11/08/2021.   91 day device clinic remote transmission 10/17/2021.   ?  ?EP/Cardiology  Office Visits: 12/27/2021 with Dr. Diona Browner.  11/30/2021 with [name], PA.   ?  ?Copy of ICM check sent to Dr. Johney Frame. ? ?3 month ICM trend: 10/10/2021. ? ? ? ?12-14 Month ICM trend:  ? ? ? ?[name], RN ?10/12/2021 ?1:53 PM ? ?

## 2021-10-17 ENCOUNTER — Ambulatory Visit (INDEPENDENT_AMBULATORY_CARE_PROVIDER_SITE_OTHER): Payer: Medicare PPO

## 2021-10-17 DIAGNOSIS — I255 Ischemic cardiomyopathy: Secondary | ICD-10-CM

## 2021-10-17 LAB — CUP PACEART REMOTE DEVICE CHECK
Battery Remaining Longevity: 46 mo
Battery Remaining Percentage: 52 %
Battery Voltage: 2.95 V
Brady Statistic AP VP Percent: 88 %
Brady Statistic AP VS Percent: 2 %
Brady Statistic AS VP Percent: 2.6 %
Brady Statistic AS VS Percent: 1 %
Brady Statistic RA Percent Paced: 81 %
Date Time Interrogation Session: 20230508020026
HighPow Impedance: 71 Ohm
HighPow Impedance: 71 Ohm
Implantable Lead Implant Date: 20200205
Implantable Lead Implant Date: 20200205
Implantable Lead Implant Date: 20200205
Implantable Lead Location: 753858
Implantable Lead Location: 753859
Implantable Lead Location: 753860
Implantable Pulse Generator Implant Date: 20200205
Lead Channel Impedance Value: 1050 Ohm
Lead Channel Impedance Value: 460 Ohm
Lead Channel Impedance Value: 510 Ohm
Lead Channel Pacing Threshold Amplitude: 0.75 V
Lead Channel Pacing Threshold Amplitude: 1 V
Lead Channel Pacing Threshold Amplitude: 1.25 V
Lead Channel Pacing Threshold Pulse Width: 0.5 ms
Lead Channel Pacing Threshold Pulse Width: 0.5 ms
Lead Channel Pacing Threshold Pulse Width: 0.5 ms
Lead Channel Sensing Intrinsic Amplitude: 12 mV
Lead Channel Sensing Intrinsic Amplitude: 3.6 mV
Lead Channel Setting Pacing Amplitude: 2 V
Lead Channel Setting Pacing Amplitude: 2 V
Lead Channel Setting Pacing Amplitude: 2.5 V
Lead Channel Setting Pacing Pulse Width: 0.5 ms
Lead Channel Setting Pacing Pulse Width: 0.5 ms
Lead Channel Setting Sensing Sensitivity: 0.5 mV
Pulse Gen Serial Number: 9880571

## 2021-11-02 NOTE — Progress Notes (Signed)
Remote ICD transmission.   

## 2021-11-08 ENCOUNTER — Ambulatory Visit (INDEPENDENT_AMBULATORY_CARE_PROVIDER_SITE_OTHER): Payer: 59

## 2021-11-08 DIAGNOSIS — Z95810 Presence of automatic (implantable) cardiac defibrillator: Secondary | ICD-10-CM | POA: Diagnosis not present

## 2021-11-08 DIAGNOSIS — I5022 Chronic systolic (congestive) heart failure: Secondary | ICD-10-CM | POA: Diagnosis not present

## 2021-11-08 NOTE — Progress Notes (Signed)
EPIC Encounter for ICM Monitoring  Patient Name: Allen [name] is a 88 y.o. male Date: 11/08/2021 Primary Care Physican: [name], MD Primary Cardiologist: Diona Browner Electrophysiologist: Allred Bi-V Pacing: 92%     09/21/2021 Weight: 174 lbs 10/04/2021 Weight: 174-175 lbs  11/08/2021 Weight: 175 lbs   AT/AF Burden <1%           Spoke with patient and heart failure questions reviewed.  Pt asymptomatic for fluid accumulation.  Reports feeling well at this time and voices no complaints.         CorVue thoracic impedance suggesting possible fluid accumulation starting 5/20 with exception of 5/26 & 5/27 at baseline.    Pt has Barostim.   Prescribed:  Furosemide 20 mg Take 1 tablet by mouth daily as needed. Weight gain of 2-3 lbs in a 24 hour period or 5 lbs in one week.  Pt reports 11/08/2021 he takes Furosemide daily instead of PRN. Jardiance 10 mg take 1 tablet before breakfast   Labs: 09/21/2021 Creatinine 1.62, BUN 21, Potassium 5.2, Sodium 145, GFR 41 08/09/2021 Creatinine 1.55, BUN 19, Potassium 4.7, Sodium 144, GFR 44 07/15/2021 Creatinine 1.55, BUN 19, Potassium 4.7, Sodium 144 05/24/2021 Creatinine 1.68, BUN 21, Potassium 5.1, Sodium 144, GFR 40 01/13/2021 Creatinine 1.64, BUN 16, Potassium 4.8, Sodium 145 12/29/2020 Creatinine 2.02, BUN 24, Potassium 4.7, Sodium 142, GFR 32  10/13/2020 Creatinine 1.61, BUN 16, Potassium 4.8, Sodium 144 07/08/2020 Creatinine 1.46, BUN 20, Potassium 4.7, Sodium 142, GFR 44-50  A complete set of results can be found in Results Review.   Recommendations:   Advised to limit salt and fluid intake.  Encouraged to call if develops an fluid symptoms.   Follow-up plan: ICM clinic phone appointment on 11/15/2021 to recheck fluid levels.   91 day device clinic remote transmission 01/16/2022.     EP/Cardiology Office Visits: 12/27/2021 with Dr. Diona Browner.  11/30/2021 with [name], PA.     Copy of ICM check sent to Dr. Johney Frame.  Copy to Dr Diona Browner for  review and recommendations if needed.    3 month ICM trend: 11/08/2021.    12-14 Month ICM trend:     [name], RN 11/08/2021 12:41 PM

## 2021-11-15 ENCOUNTER — Ambulatory Visit (INDEPENDENT_AMBULATORY_CARE_PROVIDER_SITE_OTHER): Payer: 59 | Admitting: Student

## 2021-11-15 ENCOUNTER — Ambulatory Visit (INDEPENDENT_AMBULATORY_CARE_PROVIDER_SITE_OTHER): Payer: 59

## 2021-11-15 ENCOUNTER — Encounter: Payer: Self-pay | Admitting: Student

## 2021-11-15 VITALS — BP 130/76 | HR 63 | Ht 68.0 in | Wt 175.4 lb

## 2021-11-15 DIAGNOSIS — I5022 Chronic systolic (congestive) heart failure: Secondary | ICD-10-CM | POA: Diagnosis not present

## 2021-11-15 DIAGNOSIS — Z95810 Presence of automatic (implantable) cardiac defibrillator: Secondary | ICD-10-CM

## 2021-11-15 DIAGNOSIS — I5020 Unspecified systolic (congestive) heart failure: Secondary | ICD-10-CM

## 2021-11-15 DIAGNOSIS — I255 Ischemic cardiomyopathy: Secondary | ICD-10-CM

## 2021-11-15 DIAGNOSIS — R001 Bradycardia, unspecified: Secondary | ICD-10-CM | POA: Diagnosis not present

## 2021-11-15 LAB — CUP PACEART INCLINIC DEVICE CHECK
Battery Remaining Longevity: 43 mo
Brady Statistic RA Percent Paced: 82 %
Brady Statistic RV Percent Paced: 92 %
Date Time Interrogation Session: 20230606122355
HighPow Impedance: 67.5 Ohm
Implantable Lead Implant Date: 20200205
Implantable Lead Implant Date: 20200205
Implantable Lead Implant Date: 20200205
Implantable Lead Location: 753858
Implantable Lead Location: 753859
Implantable Lead Location: 753860
Implantable Pulse Generator Implant Date: 20200205
Lead Channel Impedance Value: 1037.5 Ohm
Lead Channel Impedance Value: 450 Ohm
Lead Channel Impedance Value: 475 Ohm
Lead Channel Pacing Threshold Amplitude: 0.75 V
Lead Channel Pacing Threshold Amplitude: 1 V
Lead Channel Pacing Threshold Amplitude: 1 V
Lead Channel Pacing Threshold Amplitude: 1.5 V
Lead Channel Pacing Threshold Amplitude: 1.5 V
Lead Channel Pacing Threshold Pulse Width: 0.5 ms
Lead Channel Pacing Threshold Pulse Width: 0.5 ms
Lead Channel Pacing Threshold Pulse Width: 0.5 ms
Lead Channel Pacing Threshold Pulse Width: 0.5 ms
Lead Channel Pacing Threshold Pulse Width: 0.5 ms
Lead Channel Sensing Intrinsic Amplitude: 12 mV
Lead Channel Sensing Intrinsic Amplitude: 3.4 mV
Lead Channel Setting Pacing Amplitude: 2 V
Lead Channel Setting Pacing Amplitude: 2 V
Lead Channel Setting Pacing Amplitude: 2.5 V
Lead Channel Setting Pacing Pulse Width: 0.5 ms
Lead Channel Setting Pacing Pulse Width: 0.5 ms
Lead Channel Setting Sensing Sensitivity: 0.5 mV
Pulse Gen Serial Number: 9880571

## 2021-11-15 NOTE — Progress Notes (Signed)
Electrophysiology Office Note Date: 11/15/2021  ID:  Allen [name], DOB 03-15-1934, MRN [id number]  PCP: [name], MD Primary Cardiologist: [name], MD Electrophysiologist: Hillis Range, MD   CC: Barostim titration   Allen [name] is a 88 y.o. male seen today  for barostim titration.   At previous visit he was programmed from 6.0 to 9.0 ma. He had stimulation at 10 ma described as an urge to cough.   Since last visit pt feels about the same. He does not feel like he has received any significant benefit from BAT. He wears his O2 constantly at home, and gets around a lot better than when out in public. He has an unwieldy portable O2 concentrator that is difficult to carry for prolonged periods.  In this setting, he continues to not use his O2 UNTIL he gets SOB. He usually gets around the house and does ADLs without difficulty. Can push a cart around a grocery store if a motorized one is not available.    The patients goals for barostim titration are to fish without fatigue or SOB, do more household chores, and start walking again.    Device History: Barostim (standard) implanted 05/27/2021 for Chronic systolic CHF St. Jude BiV ICD implanted 07/17/2018 for ICM, NYHA III, SSS, LBBB History of appropriate therapy: No History of AAD therapy: No    Past Medical History:  Diagnosis Date   AICD (automatic cardioverter/defibrillator) present    Biventricular ICD (implantable cardioverter-defibrillator) in place 07/17/2018   CAD (coronary artery disease)    a. 04/2016: s/p CABG with LIMA-LAD, SVG-D1, SVG-RCA, and seq SVG-OM1-OM2   CHF (congestive heart failure) (HCC)    CKD (chronic kidney disease)    Essential hypertension    Hyperlipidemia    Ischemic cardiomyopathy    LVEF 35% May 2016   LBBB (left bundle branch block)    Pneumonia due to COVID-19 virus    Sinus bradycardia    Type 2 diabetes mellitus (HCC)    Past Surgical History:  Procedure Laterality Date    BIV ICD INSERTION CRT-D  07/17/2018   BIV ICD INSERTION CRT-D N/A 07/17/2018   Procedure: BIV ICD INSERTION CRT-D;  Surgeon: Hillis Range, MD;  [facility]: [facility] INVASIVE CV LAB;  Service: Cardiovascular;  Laterality: N/A;   CARDIAC CATHETERIZATION N/A 11/02/2014   Procedure: Left Heart Cath and Coronary Angiography;  Surgeon: Lyn Records, MD;  Location: [facility] INVASIVE CV LAB;  Service: Cardiovascular;  Laterality: N/A;   CARDIAC CATHETERIZATION N/A 05/03/2016   Procedure: Left Heart Cath and Coronary Angiography;  Surgeon: Peter M Swaziland, MD;  Location: [facility] INVASIVE CV LAB;  Service: Cardiovascular;  Laterality: N/A;   CORONARY ARTERY BYPASS GRAFT N/A 05/08/2016   Procedure: CORONARY ARTERY BYPASS GRAFTING (CABG) x 5;  Surgeon: Alleen Borne, MD;  Location: [facility] OR;  Service: Open Heart Surgery;  Laterality: N/A;   ENDOVEIN HARVEST OF GREATER SAPHENOUS VEIN  05/08/2016   Procedure: ENDOVEIN HARVEST OF GREATER SAPHENOUS VEIN;  Surgeon: Alleen Borne, MD;  Location: [facility] OR;  Service: Open Heart Surgery;;   TEE WITHOUT CARDIOVERSION N/A 05/08/2016   Procedure: TRANSESOPHAGEAL ECHOCARDIOGRAM (TEE);  Surgeon: Alleen Borne, MD;  Location: [facility] OR;  Service: Open Heart Surgery;  Laterality: N/A;    Current Outpatient Medications  Medication Sig Dispense Refill   aspirin 81 MG EC tablet Take 81 mg by mouth daily.     atorvastatin (LIPITOR) 80 MG tablet Take 1 tablet (80 mg total)  by mouth daily at 6 PM. 90 tablet 3   carvedilol (COREG) 6.25 MG tablet TAKE 1 TABLET(6.25 MG) BY MOUTH TWICE DAILY WITH A MEAL 180 tablet 1   clopidogrel (PLAVIX) 75 MG tablet TAKE 1 TABLET EVERY DAY WITH BREAKFAST 90 tablet 1   ENTRESTO 49-51 MG TAKE 1 TABLET TWICE DAILY 180 tablet 1   furosemide (LASIX) 20 MG tablet Take 20 mg by mouth daily as needed (Weight gain of 2-3 lbs in a 24 hour period or 5 lbs in one week).     HYDROcodone-acetaminophen (NORCO/VICODIN) 5-325 MG tablet Take 1 tablet by mouth 3 (three) times daily as  needed.     ibuprofen (ADVIL) 800 MG tablet Take 800 mg by mouth every 6 (six) hours as needed.     Insulin Glargine (LANTUS SOLOSTAR) 100 UNIT/ML Solostar Pen Inject 20 Units into the skin at bedtime. 15 mL 11   ipratropium (ATROVENT) 0.03 % nasal spray Place into both nostrils.     JARDIANCE 10 MG TABS tablet TAKE 1 TABLET(10 MG) BY MOUTH DAILY BEFORE BREAKFAST 30 tablet 6   mirtazapine (REMERON) 15 MG tablet Take 15 mg by mouth at bedtime.     nitroGLYCERIN (NITROSTAT) 0.4 MG SL tablet Place 0.4 mg under the tongue every 5 (five) minutes as needed for chest pain (If no relief after 3rd dose, proceed to the ED).     oxyCODONE-acetaminophen (PERCOCET/ROXICET) 5-325 MG tablet Take 1 tablet by mouth every 6 (six) hours as needed. 20 tablet 0   oxyCODONE-acetaminophen (PERCOCET/ROXICET) 5-325 MG tablet Take 1 tablet by mouth every 6 (six) hours as needed. 20 tablet 0   OXYGEN Inhale 3 L into the lungs continuous. 3 lpm 24/7 DME- [facility]     repaglinide (PRANDIN) 2 MG tablet Take 2 mg by mouth 2 (two) times daily before a meal.     vitamin B-12 (CYANOCOBALAMIN) 1000 MCG tablet Take 1,000 mcg by mouth daily.     No current facility-administered medications for this visit.    Allergies:   Patient has no known allergies.   Social History: Social History   Socioeconomic History   Marital status: Married    Spouse name: Not on file   Number of children: Not on file   Years of education: Not on file   Highest education level: Not on file  Occupational History   Occupation: [name]    Comment: Retired  Tobacco Use   Smoking status: Former    Packs/day: 0.50    Years: 56.00    Pack years: 28.00    Types: Cigarettes    Start date: 02/18/1956    Quit date: 06/13/2011    Years since quitting: 10.4   Smokeless tobacco: Never  Vaping Use   Vaping Use: Never used  Substance and Sexual Activity   Alcohol use: No    Alcohol/week: 0.0 standard drinks   Drug use: No   Sexual activity: Never   Other Topics Concern   Not on file  Social History Narrative   Retired Naval architect, 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    Physical Exam: Vitals:   11/15/21 1058  BP: 130/76  Pulse: 63  SpO2: 92%  Weight: 175 lb 6.4 oz (79.6 kg)  Height: 5\' 8"  (1.727 m)     Wt Readings from Last 3 Encounters:  11/15/21 175 lb 6.4 oz (79.6 kg)  09/21/21 174 lb 9.6 oz (79.2 kg)  08/09/21 173 lb 3.2 oz (78.6 kg)   General: Pleasant, NAD. No resp difficulty Psych: Normal affect. HEENT:  Normal, without mass or lesion.         Neck: Supple, no bruits or JVD. Carotids 2+. No lymphadenopathy/thyromegaly appreciated. Heart: PMI nondisplaced. RRR no s3, s4, or murmurs. Lungs:  Resp regular and unlabored, CTA. Abdomen: Soft, non-tender, non-distended, No HSM, BS + x 4.   Extremities: No clubbing, cyanosis or edema. DP/PT/Radials 2+ and equal bilaterally. Neuro: Alert and oriented X 3. Moves all extremities spontaneously.   Barostim Interrogation- Performed personally and reviewed in detail today,  See scanned report  EKG:  EKG is not ordered today  Recent Labs: 05/24/2021: ALT 18; Hemoglobin 17.7; Platelets 223 09/21/2021: BUN 17; Creatinine, Ser 1.70; NT-Pro BNP 691; Potassium 4.8; Sodium 143   Wt Readings from Last 3 Encounters:  11/15/21 175 lb 6.4 oz (79.6 kg)  09/21/21 174 lb 9.6 oz (79.2 kg)  08/09/21 173 lb 3.2 oz (78.6 kg)     Other studies Reviewed: Additional studies/  records that were reviewed today include: Previous EP notes  Assessment and Plan:  1. Chronic systolic CHF s/p St. Jude and Barostim implantation NYHA II-III symptoms.   Will consider 9.0 final programming given stim at higher level. Suspect his symptoms are more driven by his COPD. Encouraged continuous O2 use as ordered.  Device impedence stable.  Pt goals are to fish, walk, and chore.  See scanned report. S/p AV optimization 10/27/2019. He felt somewhat better and has maintained this. V-V optimization 02/2020. QRS 176 > 122 ms after reprogramming with positive/RBBB V1 and biphasic lead 1. Previous optimization imaging was reviewed and felt appropriate to increase PAV delay at this time, with intrinsic A to R of 300 ms. Encouraged more judicious use of his prn lasix.   Labs last visit OK. Declines today.    2. Sinus bradycardia Stable post CRT    3. HTN Stable on current regimen.    4. Vertiginous dizziness PCP follows.   5. Chronic hypoxic respiratory failure Intermittent O2 use  Current medicines are reviewed at length with the patient today.   The patient does not have concerns regarding his medicines.  The following changes were made today:  none  Disposition:   Follow up with EP APP in 6 months for chronic check.   Dustin FlockSigned, Sandra Tellefsen Andrew Madailein Londo, PA-C  11/15/2021 12:27 PM  [facility] [address] [place] [address] [phone] (office) [phone] (fax)

## 2021-11-15 NOTE — Patient Instructions (Signed)

## 2021-11-16 NOTE — Progress Notes (Signed)
EPIC Encounter for ICM Monitoring  Patient Name: Allen [name] is a 88 y.o. male Date: 11/16/2021 Primary Care Physican: [name], MD Primary Cardiologist: Diona Browner Electrophysiologist: Allred Bi-V Pacing: 92%     09/21/2021 Weight: 174 lbs 10/04/2021 Weight: 174-175 lbs  11/08/2021 Weight: 175 lbs   AT/AF Burden <1%           Spoke with patient and heart failure questions reviewed.  Pt asymptomatic for fluid accumulation.          CorVue thoracic impedance suggesting fluid levels returned to normal.    Pt has Barostim.   Prescribed:  Furosemide 20 mg Take 1 tablet by mouth daily as needed. Weight gain of 2-3 lbs in a 24 hour period or 5 lbs in one week.  Pt reports 11/08/2021 he takes Furosemide daily instead of PRN. Jardiance 10 mg take 1 tablet before breakfast   Labs: 09/21/2021 Creatinine 1.62, BUN 21, Potassium 5.2, Sodium 145, GFR 41 08/09/2021 Creatinine 1.55, BUN 19, Potassium 4.7, Sodium 144, GFR 44 07/15/2021 Creatinine 1.55, BUN 19, Potassium 4.7, Sodium 144 A complete set of results can be found in Results Review.   Recommendations:   Encouraged to call if develops an fluid symptoms.   Follow-up plan: ICM clinic phone appointment on 12/19/2021.   91 day device clinic remote transmission 01/16/2022.     EP/Cardiology Office Visits: 12/27/2021 with Dr. Diona Browner.  Recall 05/14/2022 with [name], PA.     Copy of ICM check sent to Dr. Johney Frame.   3 month ICM trend: 11/15/2021.    12-14 Month ICM trend:     [name], RN 11/16/2021 9:16 AM

## 2021-11-30 ENCOUNTER — Encounter: Payer: 59 | Admitting: Student

## 2021-12-05 ENCOUNTER — Other Ambulatory Visit: Payer: Self-pay | Admitting: Internal Medicine

## 2021-12-05 ENCOUNTER — Other Ambulatory Visit: Payer: Self-pay | Admitting: Cardiology

## 2021-12-05 ENCOUNTER — Telehealth: Payer: Self-pay | Admitting: Cardiology

## 2021-12-05 MED ORDER — CLOPIDOGREL BISULFATE 75 MG PO TABS: ORAL_TABLET | ORAL | 1 refills | Status: DC

## 2021-12-15 ENCOUNTER — Other Ambulatory Visit: Payer: 59

## 2021-12-19 ENCOUNTER — Ambulatory Visit (INDEPENDENT_AMBULATORY_CARE_PROVIDER_SITE_OTHER): Payer: 59

## 2021-12-19 DIAGNOSIS — I5022 Chronic systolic (congestive) heart failure: Secondary | ICD-10-CM

## 2021-12-19 DIAGNOSIS — Z95810 Presence of automatic (implantable) cardiac defibrillator: Secondary | ICD-10-CM | POA: Diagnosis not present

## 2021-12-20 ENCOUNTER — Ambulatory Visit (INDEPENDENT_AMBULATORY_CARE_PROVIDER_SITE_OTHER): Payer: 59

## 2021-12-20 DIAGNOSIS — I350 Nonrheumatic aortic (valve) stenosis: Secondary | ICD-10-CM

## 2021-12-20 DIAGNOSIS — I255 Ischemic cardiomyopathy: Secondary | ICD-10-CM

## 2021-12-20 LAB — ECHOCARDIOGRAM COMPLETE
AR max vel: 1.28 cm2
AV Area VTI: 1.54 cm2
AV Area mean vel: 1.38 cm2
AV Mean grad: 9.7 mmHg
AV Peak grad: 18.2 mmHg
AV Vena cont: 0.36 cm
Ao pk vel: 2.13 m/s
Area-P 1/2: 2.08 cm2
Calc EF: 50.3 %
MV M vel: 4.66 m/s
MV Peak grad: 86.9 mmHg
P 1/2 time: 924 msec
S' Lateral: 3.94 cm
Single Plane A2C EF: 54.4 %
Single Plane A4C EF: 45.8 %

## 2021-12-20 NOTE — Progress Notes (Signed)
EPIC Encounter for ICM Monitoring  Patient Name: Allen [name] is a 88 y.o. male Date: 12/20/2021 Primary Care Physican: [name], MD Primary Cardiologist: Diona Browner Electrophysiologist: Allred Bi-V Pacing: 96%     12/20/2021 Weight: 175 lbs   AT/AF Burden <1%           Spoke with patient and heart failure questions reviewed.  Pt asymptomatic for fluid accumulation.          CorVue thoracic impedance suggesting normal fluid levels.    Pt has Barostim.   Prescribed:  Furosemide 20 mg Take 1 tablet by mouth daily as needed. Weight gain of 2-3 lbs in a 24 hour period or 5 lbs in one week.  Pt reports 11/08/2021 he takes Furosemide daily instead of PRN. Jardiance 10 mg take 1 tablet before breakfast   Labs: 09/21/2021 Creatinine 1.62, BUN 21, Potassium 5.2, Sodium 145, GFR 41 08/09/2021 Creatinine 1.55, BUN 19, Potassium 4.7, Sodium 144, GFR 44 07/15/2021 Creatinine 1.55, BUN 19, Potassium 4.7, Sodium 144 A complete set of results can be found in Results Review.   Recommendations:   No changes and encouraged to call if experiencing any fluid symptoms.   Follow-up plan: ICM clinic phone appointment on 01/23/2022.   91 day device clinic remote transmission 01/16/2022.     EP/Cardiology Office Visits: 12/27/2021 with Dr. Diona Browner.  Recall 05/14/2022 with [name], PA.     Copy of ICM check sent to Dr. Johney Frame.   3 month ICM trend: 12/19/2021.    12-14 Month ICM trend:     [name], RN 12/20/2021 3:36 PM

## 2021-12-26 NOTE — Progress Notes (Unsigned)
Cardiology Office Note  Date: 12/27/2021   ID: Allen [name], DOB 04/22/1934, MRN [id number]  PCP:  [name], MD  Cardiologist:  [name], MD Electrophysiologist:  Hillis Range, MD   Chief Complaint  Patient presents with   Cardiac follow-up    History of Present Illness: Allen [name] is an 88 y.o. male last seen in January.  He is here for a routine visit.  Reports stable NYHA class II-III dyspnea depending on level of activity.  He has been more sedentary of late.  Uses his supplemental oxygen intermittently during the daytime, consistently at nighttime.  He went to his sister's funeral yesterday, he his last surviving sibling from his family.  He is status post placement of a Barostim Neo device by Dr. Myra Gianotti.  ECG was attempted today but there was significant interference from [facility] device.  He has a St. Jude biventricular ICD in place with follow-up by Dr. Johney Frame. Recent thoracic impedance was normal. Biventricular pacing at 92%. I reviewed the recent EP office note from June.  His weight is stable.  Reports no fluctuation in current diuretic regimen.  Recent echocardiogram showed LVEF 35-40%, mild mitral regurgitation, and mild aortic stenosis, and moderate aortic regurgitation.  I reviewed his medications and we are requesting his most recent lab work from PCP.  Past Medical History:  Diagnosis Date   AICD (automatic cardioverter/defibrillator) present    Biventricular ICD (implantable cardioverter-defibrillator) in place 07/17/2018   CAD (coronary artery disease)    a. 04/2016: s/p CABG with LIMA-LAD, SVG-D1, SVG-RCA, and seq SVG-OM1-OM2   CHF (congestive heart failure) (HCC)    CKD (chronic kidney disease)    Essential hypertension    Hyperlipidemia    Ischemic cardiomyopathy    LVEF 35% May 2016   LBBB (left bundle branch block)    Pneumonia due to COVID-19 virus    Sinus bradycardia    Type 2 diabetes mellitus (HCC)     Past Surgical  History:  Procedure Laterality Date   BIV ICD INSERTION CRT-D  07/17/2018   BIV ICD INSERTION CRT-D N/A 07/17/2018   Procedure: BIV ICD INSERTION CRT-D;  Surgeon: Hillis Range, MD;  Location: [facility] INVASIVE CV LAB;  Service: Cardiovascular;  Laterality: N/A;   CARDIAC CATHETERIZATION N/A 11/02/2014   Procedure: Left Heart Cath and Coronary Angiography;  Surgeon: Lyn Records, MD;  Location: [facility] INVASIVE CV LAB;  Service: Cardiovascular;  Laterality: N/A;   CARDIAC CATHETERIZATION N/A 05/03/2016   Procedure: Left Heart Cath and Coronary Angiography;  Surgeon: Peter M Swaziland, MD;  Location: [facility] INVASIVE CV LAB;  Service: Cardiovascular;  Laterality: N/A;   CORONARY ARTERY BYPASS GRAFT N/A 05/08/2016   Procedure: CORONARY ARTERY BYPASS GRAFTING (CABG) x 5;  Surgeon: Alleen Borne, MD;  Location: [facility] OR;  Service: Open Heart Surgery;  Laterality: N/A;   ENDOVEIN HARVEST OF GREATER SAPHENOUS VEIN  05/08/2016   Procedure: ENDOVEIN HARVEST OF GREATER SAPHENOUS VEIN;  Surgeon: Alleen Borne, MD;  Location: [facility] OR;  Service: Open Heart Surgery;;   TEE WITHOUT CARDIOVERSION N/A 05/08/2016   Procedure: TRANSESOPHAGEAL ECHOCARDIOGRAM (TEE);  Surgeon: Alleen Borne, MD;  [facility] OR;  Service: Open Heart Surgery;  Laterality: N/A;    Current Outpatient Medications  Medication Sig Dispense Refill   aspirin 81 MG EC tablet Take 81 mg by mouth daily.     atorvastatin (LIPITOR) 80 MG tablet Take 1 tablet (80 mg total) by mouth daily at 6 PM. 90  tablet 3   clopidogrel (PLAVIX) 75 MG tablet TAKE 1 TABLET EVERY DAY WITH BREAKFAST 90 tablet 1   ENTRESTO 49-51 MG TAKE 1 TABLET BY MOUTH TWICE DAILY 180 tablet 3   furosemide (LASIX) 20 MG tablet Take 20 mg by mouth daily as needed (Weight gain of 2-3 lbs in a 24 hour period or 5 lbs in one week).     HYDROcodone-acetaminophen (NORCO/VICODIN) 5-325 MG tablet Take 1 tablet by mouth 3 (three) times daily as needed.     ibuprofen (ADVIL) 800 MG tablet Take 800 mg by  mouth every 6 (six) hours as needed.     Insulin Glargine (LANTUS SOLOSTAR) 100 UNIT/ML Solostar Pen Inject 20 Units into the skin at bedtime. 15 mL 11   ipratropium (ATROVENT) 0.03 % nasal spray Place into both nostrils.     JARDIANCE 10 MG TABS tablet TAKE 1 TABLET(10 MG) BY MOUTH DAILY BEFORE BREAKFAST 30 tablet 6   mirtazapine (REMERON) 15 MG tablet Take 15 mg by mouth at bedtime.     nitroGLYCERIN (NITROSTAT) 0.4 MG SL tablet Place 0.4 mg under the tongue every 5 (five) minutes as needed for chest pain (If no relief after 3rd dose, proceed to the ED).     oxyCODONE-acetaminophen (PERCOCET/ROXICET) 5-325 MG tablet Take 1 tablet by mouth every 6 (six) hours as needed. 20 tablet 0   oxyCODONE-acetaminophen (PERCOCET/ROXICET) 5-325 MG tablet Take 1 tablet by mouth every 6 (six) hours as needed. 20 tablet 0   OXYGEN Inhale 3 L into the lungs continuous. 3 lpm 24/7 DME- [facility]     repaglinide (PRANDIN) 2 MG tablet Take 2 mg by mouth 2 (two) times daily before a meal.     vitamin B-12 (CYANOCOBALAMIN) 1000 MCG tablet Take 1,000 mcg by mouth daily.     carvedilol (COREG) 6.25 MG tablet TAKE 1 TABLET(6.25 MG) BY MOUTH TWICE DAILY WITH A MEAL 180 tablet 3   No current facility-administered medications for this visit.   Allergies:  Patient has no known allergies.   ROS:  No syncope.  Physical Exam: VS:  BP 116/78 (BP Location: Right Arm, Patient Position: Sitting, Cuff Size: Normal)   Pulse 61   Ht 5\' 2"  (1.575 m)   Wt 176 lb (79.8 kg)   SpO2 (!) 89%   BMI 32.19 kg/m , BMI Body mass index is 32.19 kg/m.  Wt Readings from Last 3 Encounters:  12/27/21 176 lb (79.8 kg)  11/15/21 175 lb 6.4 oz (79.6 kg)  09/21/21 174 lb 9.6 oz (79.2 kg)    General: Patient appears comfortable at rest. HEENT: Conjunctiva and lids normal, oropharynx clear. Neck: Supple, no elevated JVP or carotid bruits, no thyromegaly. Lungs: Clear to auscultation, nonlabored breathing at rest. Cardiac: Regular rate and  rhythm, no S3, 2/6 systolic murmur, no pericardial rub. Extremities: No pitting edema.  ECG:  An ECG dated 10/13/2021 was personally reviewed today and demonstrated:  Sinus rhythm with prolonged PR interval, biventricular pacing and PVCs.  Recent Labwork: 05/24/2021: ALT 18; AST 33; Hemoglobin 17.7; Platelets 223 09/21/2021: BUN 17; Creatinine, Ser 1.70; NT-Pro BNP 691; Potassium 4.8; Sodium 143   Other Studies Reviewed Today:  Echocardiogram 12/20/2021:  1. Left ventricular ejection fraction, by estimation, is 35 to 40%. The  left ventricle has moderately decreased function. The left ventricle  demonstrates global hypokinesis. There is severe left ventricular  hypertrophy. Left ventricular diastolic  parameters are consistent with Grade I diastolic dysfunction (impaired  relaxation).   2. Right ventricular  systolic function is normal. The right ventricular  size is normal.   3. The mitral valve is abnormal. Mild mitral valve regurgitation. No  evidence of mitral stenosis.   4. The tricuspid valve is abnormal.   5. The aortic valve is tricuspid. There is moderate calcification of the  aortic valve. There is moderate thickening of the aortic valve. Aortic  valve regurgitation is moderate. Mild aortic valve stenosis.   6. Aortic dilatation noted. There is mild dilatation of the ascending  aorta, measuring 39 mm.   7. The inferior vena cava is normal in size with greater than 50%  respiratory variability, suggesting right atrial pressure of 3 mmHg.   Assessment and Plan:  1.  HFrEF with ischemic cardiomyopathy, LVEF stable at 35 to 40% by recent echocardiogram and RV contraction normal.  Recent thoracic impedance is normal and his weight has not changed significantly.  Continues with NYHA class II-III dyspnea also in the setting of chronic hypoxic respiratory failure with supplemental oxygen use.  Continue Coreg, Entresto, Lasix, and Jardiance.  He is not on Aldactone given CKD stage IIIb.   He has a Barostim device in place as well with continued follow-up by EP.  Requesting recent lab work from PCP.  2.  St. Jude biventricular ICD in place with follow-up by Dr. Johney Frame.  No device shocks, recent biventricular pacing at 92%.  Thoracic impedance normal.  3.  CKD stage IIIb, requesting lab work from PCP.  4.  Multivessel CAD status post CABG.  No active angina symptoms.  Continue aspirin and Lipitor.  5.  Mild aortic stenosis with moderate aortic regurgitation.  Medication Adjustments/Labs and Tests Ordered: Current medicines are reviewed at length with the patient today.  Concerns regarding medicines are outlined above.   Tests Ordered: No orders of the defined types were placed in this encounter.   Medication Changes: No orders of the defined types were placed in this encounter.   Disposition:  Follow up  6 months.  Signed, Jonelle Sidle, MD, [facility] 12/27/2021 9:05 AM    [facility] [address] [place], [place], [place] [address] Phone: [phone]; Fax: [phone]

## 2021-12-27 ENCOUNTER — Ambulatory Visit (INDEPENDENT_AMBULATORY_CARE_PROVIDER_SITE_OTHER): Payer: 59 | Admitting: Cardiology

## 2021-12-27 ENCOUNTER — Encounter: Payer: Self-pay | Admitting: Cardiology

## 2021-12-27 VITALS — BP 116/78 | HR 61 | Ht 62.0 in | Wt 176.0 lb

## 2021-12-27 DIAGNOSIS — N1832 Chronic kidney disease, stage 3b: Secondary | ICD-10-CM

## 2021-12-27 DIAGNOSIS — I5020 Unspecified systolic (congestive) heart failure: Secondary | ICD-10-CM

## 2021-12-27 DIAGNOSIS — I25119 Atherosclerotic heart disease of native coronary artery with unspecified angina pectoris: Secondary | ICD-10-CM

## 2021-12-27 NOTE — Patient Instructions (Signed)

## 2022-01-16 ENCOUNTER — Ambulatory Visit (INDEPENDENT_AMBULATORY_CARE_PROVIDER_SITE_OTHER): Payer: 59

## 2022-01-16 DIAGNOSIS — I5022 Chronic systolic (congestive) heart failure: Secondary | ICD-10-CM | POA: Diagnosis not present

## 2022-01-17 LAB — CUP PACEART REMOTE DEVICE CHECK
Battery Remaining Longevity: 43 mo
Battery Remaining Percentage: 49 %
Battery Voltage: 2.95 V
Brady Statistic AP VP Percent: 93 %
Brady Statistic AP VS Percent: 1 %
Brady Statistic AS VP Percent: 2.8 %
Brady Statistic AS VS Percent: 1 %
Brady Statistic RA Percent Paced: 89 %
Date Time Interrogation Session: 20230807020022
HighPow Impedance: 64 Ohm
HighPow Impedance: 64 Ohm
Implantable Lead Implant Date: 20200205
Implantable Lead Implant Date: 20200205
Implantable Lead Implant Date: 20200205
Implantable Lead Location: 753858
Implantable Lead Location: 753859
Implantable Lead Location: 753860
Implantable Pulse Generator Implant Date: 20200205
Lead Channel Impedance Value: 1075 Ohm
Lead Channel Impedance Value: 450 Ohm
Lead Channel Impedance Value: 540 Ohm
Lead Channel Pacing Threshold Amplitude: 0.625 V
Lead Channel Pacing Threshold Amplitude: 1 V
Lead Channel Pacing Threshold Amplitude: 1.5 V
Lead Channel Pacing Threshold Pulse Width: 0.5 ms
Lead Channel Pacing Threshold Pulse Width: 0.5 ms
Lead Channel Pacing Threshold Pulse Width: 0.5 ms
Lead Channel Sensing Intrinsic Amplitude: 12 mV
Lead Channel Sensing Intrinsic Amplitude: 2.8 mV
Lead Channel Setting Pacing Amplitude: 2 V
Lead Channel Setting Pacing Amplitude: 2 V
Lead Channel Setting Pacing Amplitude: 2.5 V
Lead Channel Setting Pacing Pulse Width: 0.5 ms
Lead Channel Setting Pacing Pulse Width: 0.5 ms
Lead Channel Setting Sensing Sensitivity: 0.5 mV
Pulse Gen Serial Number: 9880571

## 2022-01-23 ENCOUNTER — Ambulatory Visit (INDEPENDENT_AMBULATORY_CARE_PROVIDER_SITE_OTHER): Payer: 59

## 2022-01-23 DIAGNOSIS — I5022 Chronic systolic (congestive) heart failure: Secondary | ICD-10-CM

## 2022-01-23 DIAGNOSIS — Z95810 Presence of automatic (implantable) cardiac defibrillator: Secondary | ICD-10-CM

## 2022-01-25 NOTE — Progress Notes (Signed)
EPIC Encounter for ICM Monitoring  Patient Name: Allen [name] is a 88 y.o. male Date: 01/25/2022 Primary Care Physican: [name], MD Primary Cardiologist: Diona Browner Electrophysiologist: Allred Bi-V Pacing: 96%     01/25/2022 Weight: 173 lbs   AT/AF Burden <1%           Spoke with patient and heart failure questions reviewed.  Pt asymptomatic for fluid accumulation.  Reports feeling well at this time and voices no complaints.         CorVue thoracic impedance suggesting normal fluid levels.    Pt has Barostim.   Prescribed:  Furosemide 20 mg Take 1 tablet by mouth daily as needed. Weight gain of 2-3 lbs in a 24 hour period or 5 lbs in one week.  Pt reports 01/25/2022 he takes Furosemide daily instead of PRN. Jardiance 10 mg take 1 tablet before breakfast   Labs: 09/21/2021 Creatinine 1.62, BUN 21, Potassium 5.2, Sodium 145, GFR 41 08/09/2021 Creatinine 1.55, BUN 19, Potassium 4.7, Sodium 144, GFR 44 07/15/2021 Creatinine 1.55, BUN 19, Potassium 4.7, Sodium 144 A complete set of results can be found in Results Review.   Recommendations:   No changes and encouraged to call if experiencing any fluid symptoms.   Follow-up plan: ICM clinic phone appointment on 02/27/2022.   91 day device clinic remote transmission 04/17/2022.     EP/Cardiology Office Visits: 06/29/2022 with Dr. Diona Browner.  Recall 05/14/2022 with [name], PA.     Copy of ICM check sent to Dr. Johney Frame.   3 month ICM trend: 01/23/2022.    12-14 Month ICM trend:     [name], RN 01/25/2022 8:49 AM

## 2022-02-07 ENCOUNTER — Telehealth: Payer: Self-pay | Admitting: Student

## 2022-02-07 NOTE — Telephone Encounter (Signed)
Discussed with patient. Had Xray 8/22 and has severe arthritis. Unrelated to Barostim.   Offered sooner appointment and he would like to just "have it looked at".   Please schedule with me in the the upcoming weeks.

## 2022-02-07 NOTE — Telephone Encounter (Signed)
Patient has barostim device

## 2022-02-07 NOTE — Telephone Encounter (Signed)
Patient states that every since he had the device put in his neck, he has had problems turning neck. Feels like he has a bad crook on both sides. Wants to know what to do and if someone could give him a call back

## 2022-02-15 NOTE — Progress Notes (Signed)
Remote ICD transmission.   

## 2022-02-21 ENCOUNTER — Ambulatory Visit: Payer: 59 | Admitting: Student

## 2022-02-27 ENCOUNTER — Ambulatory Visit (INDEPENDENT_AMBULATORY_CARE_PROVIDER_SITE_OTHER): Payer: 59

## 2022-02-27 DIAGNOSIS — I5022 Chronic systolic (congestive) heart failure: Secondary | ICD-10-CM | POA: Diagnosis not present

## 2022-02-27 DIAGNOSIS — Z95810 Presence of automatic (implantable) cardiac defibrillator: Secondary | ICD-10-CM | POA: Diagnosis not present

## 2022-02-27 NOTE — Progress Notes (Addendum)
Electrophysiology Office Note Date: 03/07/2022  ID:  DEMAJE CUARTAS, DOB 1934-04-02, MRN [id number]  PCP: Neale Burly, MD Primary Cardiologist: Rozann Lesches, MD Electrophysiologist: Thompson Grayer, MD   CC: Barostim titration   Allen [name] is a 88 y.o. male seen today for visit due to chronic neck pain s/p barostim titration.   At previous visit he was programmed from 6.0 to 9.0 ma. He had stimulation at 10 ma described as an urge to cough.   In work up of neck pain, XR Cervical Spine AP and Lateral showed : Mild broad-based reversal of normal lordosis. There is rightward  lean. Diffuse degenerative disc disease from C3-C4 through C6-C7.  There is mild multilevel facet hypertrophy. No evidence of fracture  or focal bone abnormality. Right-sided stimulator coiled in the neck  at the level of C4-C5. There is no prevertebral soft tissue  thickening. Carotid calcifications are seen.  Since last cards visit reports doing OK overall. He has continued SOB but does not use his portable O2 when out and about. He has previously stated he waits to put it on until he is SOB. At home he has a larger stationary take and doesn't have as much issue with ADLs. Currently he is very limited from arthritic back and neck pain. R shoulder pains him when reaching up to shave.   The patients goals for barostim titration are to fish without fatigue or SOB, do more household chores, and start walking again.  He is able to walk without assistance currently, but back pain limiting.   Device History: Barostim (standard) implanted 05/27/2021 for Chronic systolic CHF St. Jude BiV ICD implanted 07/17/2018 for ICM, NYHA III, SSS, LBBB History of appropriate therapy: No History of AAD therapy: No    Past Medical History:  Diagnosis Date   AICD (automatic cardioverter/defibrillator) present    Biventricular ICD (implantable cardioverter-defibrillator) in place 07/17/2018   CAD (coronary artery  disease)    a. 04/2016: s/p CABG with LIMA-LAD, SVG-D1, SVG-RCA, and seq SVG-OM1-OM2   CHF (congestive heart failure) ([place])    CKD (chronic kidney disease)    Essential hypertension    Hyperlipidemia    Ischemic cardiomyopathy    LVEF 35% May 2016   LBBB (left bundle branch block)    Pneumonia due to COVID-19 virus    Sinus bradycardia    Type 2 diabetes mellitus ([facility])    Past Surgical History:  Procedure Laterality Date   BIV ICD INSERTION CRT-D  07/17/2018   BIV ICD INSERTION CRT-D N/A 07/17/2018   Procedure: BIV ICD INSERTION CRT-D;  Surgeon: Thompson Grayer, MD;  Location: [facility];  Service: Cardiovascular;  Laterality: N/A;   CARDIAC CATHETERIZATION N/A 11/02/2014   Procedure: Left Heart Cath and Coronary Angiography;  Surgeon: Belva Crome, MD;  Location: [facility];  Service: Cardiovascular;  Laterality: N/A;   CARDIAC CATHETERIZATION N/A 05/03/2016   Procedure: Left Heart Cath and Coronary Angiography;  Surgeon: Peter M Martinique, MD;  Location: [facility];  Service: Cardiovascular;  Laterality: N/A;   CORONARY ARTERY BYPASS GRAFT N/A 05/08/2016   Procedure: CORONARY ARTERY BYPASS GRAFTING (CABG) x 5;  Surgeon: Gaye Pollack, MD;  Location: [facility];  Service: Open Heart Surgery;  Laterality: N/A;   ENDOVEIN HARVEST OF GREATER SAPHENOUS VEIN  05/08/2016   Procedure: ENDOVEIN HARVEST OF GREATER SAPHENOUS VEIN;  Surgeon: Gaye Pollack, MD;  Location: [facility];  Service: Open Heart Surgery;;  TEE WITHOUT CARDIOVERSION N/A 05/08/2016   Procedure: TRANSESOPHAGEAL ECHOCARDIOGRAM (TEE);  Surgeon: Gaye Pollack, MD;  Location: [facility];  Service: Open Heart Surgery;  Laterality: N/A;    Current Outpatient Medications  Medication Sig Dispense Refill   aspirin 81 MG EC tablet Take 81 mg by mouth daily.     atorvastatin (LIPITOR) 80 MG tablet Take 1 tablet (80 mg total) by mouth daily at 6 PM. 90 tablet 3   carvedilol (COREG) 6.25 MG tablet TAKE 1 TABLET(6.25 MG) BY  MOUTH TWICE DAILY WITH A MEAL 180 tablet 3   clopidogrel (PLAVIX) 75 MG tablet TAKE 1 TABLET BY MOUTH EVERY DAY WITH BREAKFAST 90 tablet 1   ENTRESTO 49-51 MG TAKE 1 TABLET BY MOUTH TWICE DAILY 180 tablet 3   furosemide (LASIX) 20 MG tablet Take 20 mg by mouth daily as needed (Weight gain of 2-3 lbs in a 24 hour period or 5 lbs in one week).     ibuprofen (ADVIL) 800 MG tablet Take 800 mg by mouth every 6 (six) hours as needed.     Insulin Glargine (LANTUS SOLOSTAR) 100 UNIT/ML Solostar Pen Inject 20 Units into the skin at bedtime. 15 mL 11   ipratropium (ATROVENT) 0.03 % nasal spray Place into both nostrils.     JARDIANCE 10 MG TABS tablet TAKE 1 TABLET(10 MG) BY MOUTH DAILY BEFORE BREAKFAST 30 tablet 6   methocarbamol (ROBAXIN) 500 MG tablet Take 500 mg by mouth daily.     mirtazapine (REMERON) 15 MG tablet Take 15 mg by mouth at bedtime.     nitroGLYCERIN (NITROSTAT) 0.4 MG SL tablet Place 0.4 mg under the tongue every 5 (five) minutes as needed for chest pain (If no relief after 3rd dose, proceed to the ED).     OXYGEN Inhale 3 L into the lungs continuous. 3 lpm 24/7 DME- [facility]     repaglinide (PRANDIN) 2 MG tablet Take 2 mg by mouth 2 (two) times daily before a meal.     vitamin B-12 (CYANOCOBALAMIN) 1000 MCG tablet Take 1,000 mcg by mouth daily.     No current facility-administered medications for this visit.    Allergies:   Patient has no known allergies.   Social History: Social History   Socioeconomic History   Marital status: Married    Spouse name: Not on file   Number of children: Not on file   Years of education: Not on file   Highest education level: Not on file  Occupational History   Occupation: [name]    Comment: Retired  Tobacco Use   Smoking status: Former    Packs/day: 0.50    Years: 56.00    Total pack years: 28.00    Types: Cigarettes    Start date: 02/18/1956    Quit date: 06/13/2011    Years since quitting: 10.7   Smokeless tobacco: Never  Vaping Use    Vaping Use: Never used  Substance and Sexual Activity   Alcohol use: No    Alcohol/week: 0.0 standard drinks of alcohol   Drug use: No   Sexual activity: Never  Other Topics Concern   Not on file  Social History Narrative   Retired Administrator, 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    Physical Exam: Vitals:   03/07/22 0916 03/07/22 0917  BP: 128/78   Pulse: 63   SpO2:  92%  Weight: 175 lb (79.4 kg)   Height: 5\' 2"  (1.575 m)       Wt Readings from Last 3 Encounters:  03/07/22 175 lb (79.4 kg)  12/27/21 176 lb (79.8 kg)  11/15/21 175 lb 6.4 oz (79.6 kg)   General: Pleasant, NAD. No resp difficulty Psych: Normal affect. HEENT:  Normal, without mass or lesion.         Neck: Supple, no bruits or JVD. Carotids 2+. No lymphadenopathy/thyromegaly appreciated. Heart: PMI nondisplaced. RRR no s3, s4, or murmurs. Lungs:  Resp regular and unlabored, CTA. Abdomen: Soft, non-tender, non-distended, No HSM, BS + x 4.   Extremities: No clubbing, cyanosis or edema. DP/PT/Radials 2+ and equal bilaterally. Neuro: Alert and oriented X 3. Moves all extremities spontaneously.   Barostim Interrogation- Performed personally and reviewed in detail today,  See scanned report ICD: Not interrogated today. Full Office interrogation 11/15/2021 and most recent remote 01/16/2022   EKG:  EKG is ordered today Personal review shows A-V dual paced at 62 bpm  Recent Labs: 05/24/2021: ALT 18;  Hemoglobin 17.7; Platelets 223 09/21/2021: BUN 17; Creatinine, Ser 1.70; NT-Pro BNP 691; Potassium 4.8; Sodium 143   Wt Readings from Last 3 Encounters:  03/07/22 175 lb (79.4 kg)  12/27/21 176 lb (79.8 kg)  11/15/21 175 lb 6.4 oz (79.6 kg)     Other studies Reviewed: Additional studies/ records that were reviewed today include: Previous EP notes  Assessment and Plan:  1. Chronic systolic CHF s/p St. Jude and Barostim implantation NYHA II-III symptoms, confounded by his COPD.  Device programmed to 7.6 ma today with some relief of tenderness around his surgical site.  Device impedence stable. Improved battery life with adjustments today.  Pt goals are to fish, walk, and chore.  See scanned report S/p AV optimization 10/27/2019. He felt somewhat better and has maintained this. V-V optimization 02/2020. QRS 176 > 122 ms after reprogramming with positive/RBBB V1 and biphasic lead 1. Previous optimization imaging was reviewed and felt appropriate to increase PAV delay at this time, with intrinsic A to R of 300 ms. Encouraged more judicious use of his prn lasix.     2. Sinus bradycardia Stable post CRT    3. HTN Stable on current regimen    4. Vertiginous dizziness PCP follows.   5. Chronic hypoxic respiratory failure Intermittent O2 use  Current medicines are reviewed at length with the patient today.   The patient does not have concerns regarding his medicines.  The following changes were made today:  None  Disposition:   Follow up with EP APP in 3 months for Barostim check given changes today.   Jacalyn Lefevre, PA-C  03/07/2022 9:21 AM  [facility] [address]  [place] [address] [phone] (office) [phone] (fax)

## 2022-02-27 NOTE — Progress Notes (Unsigned)
EPIC Encounter for ICM Monitoring  Patient Name: Allen [name] is a 88 y.o. male Date: 02/27/2022 Primary Care Physican: Neale Burly, MD Primary Cardiologist: Domenic Polite Electrophysiologist: Mealor Bi-V Pacing: 96%     01/25/2022 Weight: 173 lbs   AT/AF Burden <1%           Spoke with patient and heart failure questions reviewed.  Pt reports ankle swelling during decreased impedance and has improved but not resolved.   He does not follow low salt diet.        CorVue thoracic impedance suggesting possible fluid accumulation starting 9/9 with exception of 1 day at baseline on 9/16.    Pt has Barostim.   Prescribed:  Furosemide 20 mg Take 1 tablet by mouth daily as needed. Weight gain of 2-3 lbs in a 24 hour period or 5 lbs in one week.  Pt taking Furosemide daily instead of PRN. Jardiance 10 mg take 1 tablet before breakfast   Labs: 09/21/2021 Creatinine 1.62, BUN 21, Potassium 5.2, Sodium 145, GFR 41 08/09/2021 Creatinine 1.55, BUN 19, Potassium 4.7, Sodium 144, GFR 44 07/15/2021 Creatinine 1.55, BUN 19, Potassium 4.7, Sodium 144 A complete set of results can be found in Results Review.   Recommendations:   Recommendation to limit salt intake to 2000 mg daily and fluid intake to 64 oz daily.  Encouraged to call if experiencing any fluid symptoms.    Follow-up plan: ICM clinic phone appointment on 03/06/2022 to recheck fluid levels.   91 day device clinic remote transmission 04/17/2022.     EP/Cardiology Office Visits: 06/29/2022 with Dr. Domenic Polite.  03/07/2022 with [name], PA.     Copy of ICM check sent to Dr. Myles Gip.    3 month ICM trend: 02/27/2022.    12-14 Month ICM trend:     Rosalene Billings, RN 02/27/2022 7:51 AM

## 2022-03-06 ENCOUNTER — Ambulatory Visit (INDEPENDENT_AMBULATORY_CARE_PROVIDER_SITE_OTHER): Payer: 59

## 2022-03-06 DIAGNOSIS — Z95810 Presence of automatic (implantable) cardiac defibrillator: Secondary | ICD-10-CM

## 2022-03-06 DIAGNOSIS — I5022 Chronic systolic (congestive) heart failure: Secondary | ICD-10-CM

## 2022-03-06 NOTE — Progress Notes (Unsigned)
EPIC Encounter for ICM Monitoring  Patient Name: Allen [name] is a 88 y.o. male Date: 03/06/2022 Primary Care Physican: Neale Burly, MD Primary Cardiologist: Allen [name] Electrophysiologist: Mealor Bi-V Pacing: 96%     01/25/2022 Weight: 173 lbs   AT/AF Burden <1%           Spoke with patient and heart failure questions reviewed.  Transmission results reviewed.  Pt asymptomatic for fluid accumulation.  Reports feeling well at this time and voices no complaints.          CorVue thoracic impedance suggesting fluid levels returned to baseline after recommendation to limit salt intake.    Pt has Barostim.   Prescribed:  Furosemide 20 mg Take 1 tablet by mouth daily as needed. Weight gain of 2-3 lbs in a 24 hour period or 5 lbs in one week.  Pt taking Furosemide daily instead of PRN. Jardiance 10 mg take 1 tablet before breakfast   Labs: 09/21/2021 Creatinine 1.62, BUN 21, Potassium 5.2, Sodium 145, GFR 41 08/09/2021 Creatinine 1.55, BUN 19, Potassium 4.7, Sodium 144, GFR 44 07/15/2021 Creatinine 1.55, BUN 19, Potassium 4.7, Sodium 144 A complete set of results can be found in Results Review.   Recommendations:   No changes and encouraged to call if experiencing any fluid symptoms.   Follow-up plan: ICM clinic phone appointment on 04/03/2022.   91 day device clinic remote transmission 04/17/2022.     EP/Cardiology Office Visits: 06/29/2022 with Dr. Domenic [name].  03/07/2022 with [name], PA.     Copy of ICM check sent to Dr. Myles Gip.    3 month ICM trend: 03/06/2022.    12-14 Month ICM trend:     Rosalene Billings, RN 03/06/2022 8:14 AM

## 2022-03-07 ENCOUNTER — Ambulatory Visit: Payer: 59 | Attending: Student | Admitting: Student

## 2022-03-07 ENCOUNTER — Encounter: Payer: Self-pay | Admitting: Student

## 2022-03-07 ENCOUNTER — Other Ambulatory Visit: Payer: Self-pay | Admitting: Cardiology

## 2022-03-07 VITALS — BP 128/78 | HR 63 | Ht 62.0 in | Wt 175.0 lb

## 2022-03-07 DIAGNOSIS — I5020 Unspecified systolic (congestive) heart failure: Secondary | ICD-10-CM

## 2022-03-07 DIAGNOSIS — I5022 Chronic systolic (congestive) heart failure: Secondary | ICD-10-CM

## 2022-03-07 DIAGNOSIS — Z95810 Presence of automatic (implantable) cardiac defibrillator: Secondary | ICD-10-CM

## 2022-03-07 DIAGNOSIS — I25119 Atherosclerotic heart disease of native coronary artery with unspecified angina pectoris: Secondary | ICD-10-CM | POA: Diagnosis not present

## 2022-03-07 DIAGNOSIS — R001 Bradycardia, unspecified: Secondary | ICD-10-CM

## 2022-03-07 NOTE — Patient Instructions (Signed)
Medication Instructions:  Your physician recommends that you continue on your current medications as directed. Please refer to the Current Medication list given to you today.  *If you need a refill on your cardiac medications before your next appointment, please call your pharmacy*   Lab Work: None If you have labs (blood work) drawn today and your tests are completely normal, you will receive your results only by: [facility] (if you have MyChart) OR A paper copy in the mail If you have any lab test that is abnormal or we need to change your treatment, we will call you to review the results.   Follow-Up: At [facility], you and your health needs are our priority.  As part of our continuing mission to provide you with exceptional heart care, we have created designated Provider Care Teams.  These Care Teams include your primary Cardiologist (physician) and Advanced Practice Providers (APPs -  Physician Assistants and Nurse Practitioners) who all work together to provide you with the care you need, when you need it.  Your next appointment:   06/02/2022

## 2022-03-30 NOTE — Progress Notes (Signed)
Electrophysiology Office Note Date: 04/04/2022  ID:  Allen [name], DOB [birth year], MRN [id number]  PCP: [name], MD Primary Cardiologist: [name], MD Electrophysiologist: [name], MD   CC: Routine ICD follow-up  Allen [name] is a 88 y.o. male seen today for [name], MD for barostim follow up.   At last visit pt was concerned about chronic neck pain, though work up prior to that showed severe cervical arthritis. Device down-titrated from 9.0 to 7.4 ma with some improvement of site tenderness.  He has been feeling about the same. Has had some relief with down titration of his device, but still remains very stiff into his shoulder. Explained this is likely from his severe cervical stenosis  The patients goals for barostim titration are to fish without fatigue or SOB, do more household chores, and start walking again.  He is able to walk without assistance currently, but back/neck arthritis/pain limiting   Device History: Barostim (standard) implanted 05/27/2021 for Chronic systolic CHF St. Jude BiV ICD implanted 07/17/2018 for ICM, NYHA III, SSS, LBBB History of appropriate therapy: No History of AAD therapy: No   Past Medical History:  Diagnosis Date   AICD (automatic cardioverter/defibrillator) present    Biventricular ICD (implantable cardioverter-defibrillator) in place 07/17/2018   CAD (coronary artery disease)    a. 04/2016: s/p CABG with LIMA-LAD, SVG-D1, SVG-RCA, and seq SVG-OM1-OM2   CHF (congestive heart failure) ([facility])    CKD (chronic kidney disease)    Essential hypertension    Hyperlipidemia    Ischemic cardiomyopathy    LVEF 35% May 2016   LBBB (left bundle branch block)    Pneumonia due to COVID-19 virus    Sinus bradycardia    Type 2 diabetes mellitus ([facility])    Past Surgical History:  Procedure Laterality Date   BIV ICD INSERTION CRT-D  07/17/2018   BIV ICD INSERTION CRT-D N/A 07/17/2018   Procedure: BIV ICD INSERTION  CRT-D;  Surgeon: Hillis Range, MD;  Location: [facility];  Service: Cardiovascular;  Laterality: N/A;   CARDIAC CATHETERIZATION N/A 11/02/2014   Procedure: Left Heart Cath and Coronary Angiography;  Surgeon: Lyn Records, MD;  Location: [facility];  Service: Cardiovascular;  Laterality: N/A;   CARDIAC CATHETERIZATION N/A 05/03/2016   Procedure: Left Heart Cath and Coronary Angiography;  Surgeon: Peter M Swaziland, MD;  Location: [facility];  Service: Cardiovascular;  Laterality: N/A;   CORONARY ARTERY BYPASS GRAFT N/A 05/08/2016   Procedure: CORONARY ARTERY BYPASS GRAFTING (CABG) x 5;  Surgeon: Alleen Borne, MD;  Location: [facility];  Service: Open Heart Surgery;  Laterality: N/A;   ENDOVEIN HARVEST OF GREATER SAPHENOUS VEIN  05/08/2016   Procedure: ENDOVEIN HARVEST OF GREATER SAPHENOUS VEIN;  Surgeon: Alleen Borne, MD;  Location: [facility];  Service: Open Heart Surgery;;   TEE WITHOUT CARDIOVERSION N/A 05/08/2016   Procedure: TRANSESOPHAGEAL ECHOCARDIOGRAM (TEE);  Surgeon: Alleen Borne, MD;  Location: [facility] OR;  Service: Open Heart Surgery;  Laterality: N/A;    Current Outpatient Medications  Medication Sig Dispense Refill   aspirin 81 MG EC tablet Take 81 mg by mouth daily.     atorvastatin (LIPITOR) 80 MG tablet Take 1 tablet (80 mg total) by mouth daily at 6 PM. 90 tablet 3   carvedilol (COREG) 6.25 MG tablet TAKE 1 TABLET(6.25 MG) BY MOUTH TWICE DAILY WITH A MEAL 180 tablet 3   clopidogrel (PLAVIX) 75 MG tablet TAKE  1 TABLET BY MOUTH EVERY DAY WITH BREAKFAST 90 tablet 1   ENTRESTO 49-51 MG TAKE 1 TABLET BY MOUTH TWICE DAILY 180 tablet 3   furosemide (LASIX) 20 MG tablet Take 20 mg by mouth daily as needed (Weight gain of 2-3 lbs in a 24 hour period or 5 lbs in one week).     ibuprofen (ADVIL) 800 MG tablet Take 800 mg by mouth every 6 (six) hours as needed.     Insulin Glargine (LANTUS SOLOSTAR) 100 UNIT/ML Solostar Pen Inject 20 Units into the skin at bedtime. 15 mL 11    ipratropium (ATROVENT) 0.03 % nasal spray Place into both nostrils.     JARDIANCE 10 MG TABS tablet TAKE 1 TABLET(10 MG) BY MOUTH DAILY BEFORE BREAKFAST 30 tablet 6   methocarbamol (ROBAXIN) 500 MG tablet Take 500 mg by mouth daily.     nitroGLYCERIN (NITROSTAT) 0.4 MG SL tablet Place 0.4 mg under the tongue every 5 (five) minutes as needed for chest pain (If no relief after 3rd dose, proceed to the ED).     OXYGEN Inhale 3 L into the lungs continuous. 3 lpm 24/7 DME- [facility]     repaglinide (PRANDIN) 2 MG tablet Take 2 mg by mouth 2 (two) times daily before a meal.     vitamin B-12 (CYANOCOBALAMIN) 1000 MCG tablet Take 1,000 mcg by mouth daily.     No current facility-administered medications for this visit.    Allergies:   Patient has no known allergies.   Social History: Social History   Socioeconomic History   Marital status: Married    Spouse name: Not on file   Number of children: Not on file   Years of education: Not on file   Highest education level: Not on file  Occupational History   Occupation: [name]    Comment: Retired  Tobacco Use   Smoking status: Former    Packs/day: 0.50    Years: 56.00    Total pack years: 28.00    Types: Cigarettes    Start date: 02/18/1956    Quit date: 06/13/2011    Years since quitting: 10.8   Smokeless tobacco: Never  Vaping Use   Vaping Use: Never used  Substance and Sexual Activity   Alcohol use: No    Alcohol/week: 0.0 standard drinks of alcohol   Drug use: No   Sexual activity: Never  Other Topics Concern   Not on file  Social History Narrative   Retired Administrator, 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   Physical Exam: Vitals:   04/04/22 0958  BP: 122/68  Pulse: 73  SpO2: 94%  Weight: 169 lb 12.8 oz (77 kg)  Height: 5\' 2"  (1.575 m)     GEN- The patient is well appearing, alert and oriented x 3 today.   HEENT: normocephalic, atraumatic; sclera clear, conjunctiva pink; hearing  intact; oropharynx clear; neck supple, no JVP Lymph- no cervical lymphadenopathy Lungs- Clear to ausculation bilaterally, normal work of breathing.  No wheezes, rales, rhonchi Heart- Regular  rate and rhythm, no murmurs, rubs or gallops, PMI not laterally displaced GI- soft, non-tender, non-distended, bowel sounds present, no hepatosplenomegaly Extremities- no clubbing or cyanosis. No peripheral edema; DP/PT/radial pulses 2+ bilaterally MS- no significant deformity or atrophy Skin- warm and dry, no rash or lesion; ICD pocket well healed Psych- euthymic mood, full affect Neuro- strength and sensation are intact  ICD interrogation- reviewed in detail today,  See PACEART report  EKG:  EKG is not ordered today.  Recent Labs: 05/24/2021: ALT 18; Hemoglobin 17.7; Platelets 223 09/21/2021: BUN 17; Creatinine, Ser 1.70; NT-Pro BNP 691; Potassium 4.8; Sodium 143   Wt Readings from Last 3 Encounters:  04/04/22 169 lb 12.8 oz (77 kg)  03/07/22 175 lb (79.4 kg)  12/27/21 176 lb (79.8 kg)     Other studies Reviewed: Additional studies/ records that were reviewed today include: Previous EP office notes.   Assessment and Plan:  1. Chronic systolic CHF s/p St. Jude and Barostim implantation NYHA III symptoms, confounded by his COPD.  Device programmed 7.6 ma and not adjusted today.  Device impedence stable last visit. Improved battery life with adjustments last visit. Pt goals are to fish, walk,  and chore.  S/p AV optimization 10/27/2019. He felt somewhat better and has maintained this. V-V optimization 02/2020. QRS 176 > 122 ms after reprogramming with positive/RBBB V1 and biphasic lead 1. Previous optimization imaging was reviewed and felt appropriate to increase PAV delay at this time, with intrinsic A to R of 300 ms. Encouraged more judicious use of his prn lasix.     2. Sinus bradycardia Stable post CRT    3. HTN Stable on current regimen    4. Vertiginous dizziness PCP follows.    5. Chronic hypoxic respiratory failure Intermittent O2 use, Again encouraged persistent usage.   6. C-spine arthritis Very limiting and he is having a hard time separating these symptoms from his BAT device. I do not think further down titration is likely to help with these symptoms, but am happy to do so if he wishes.   Current medicines are reviewed at length with the patient today.    Disposition:   Follow up with EP APP in 6 months    Signed, Shirley Friar, PA-C  04/04/2022 11:11 AM  [facility] [address] [place] [address] [phone] (office) [phone] (fax)

## 2022-04-03 ENCOUNTER — Ambulatory Visit (INDEPENDENT_AMBULATORY_CARE_PROVIDER_SITE_OTHER): Payer: 59

## 2022-04-03 DIAGNOSIS — Z95810 Presence of automatic (implantable) cardiac defibrillator: Secondary | ICD-10-CM | POA: Diagnosis not present

## 2022-04-03 DIAGNOSIS — I5022 Chronic systolic (congestive) heart failure: Secondary | ICD-10-CM

## 2022-04-03 NOTE — Progress Notes (Signed)
EPIC Encounter for ICM Monitoring  Patient Name: Allen [name] is a 88 y.o. male Date: 04/03/2022 Primary Care Physican: Neale Burly, MD Primary Cardiologist: Domenic Polite Electrophysiologist: Mealor Bi-V Pacing: 96%     01/25/2022 Weight: 173 lbs   AT/AF Burden <1%           Transmission reviewed.          CorVue thoracic impedance suggesting possible dryness but trending close to baseline.    Pt has Barostim.   Prescribed:  Furosemide 20 mg Take 1 tablet by mouth daily as needed. Weight gain of 2-3 lbs in a 24 hour period or 5 lbs in one week.  Pt taking Furosemide daily instead of PRN. Jardiance 10 mg take 1 tablet before breakfast   Labs: 09/21/2021 Creatinine 1.62, BUN 21, Potassium 5.2, Sodium 145, GFR 41 08/09/2021 Creatinine 1.55, BUN 19, Potassium 4.7, Sodium 144, GFR 44 07/15/2021 Creatinine 1.55, BUN 19, Potassium 4.7, Sodium 144 A complete set of results can be found in Results Review.   Recommendations:   Recommendations will be given at 10/24 OV.     Follow-up plan: ICM clinic phone appointment on 05/08/2022.   91 day device clinic remote transmission 04/17/2022.     EP/Cardiology Office Visits: 06/29/2022 with Dr. Domenic Polite.  04/04/2022 with [name], PA.     Copy of ICM check sent to Dr. Myles Gip.  3 month ICM trend: 04/03/2022.    12-14 Month ICM trend:     Rosalene Billings, RN 04/03/2022 5:19 PM

## 2022-04-04 ENCOUNTER — Ambulatory Visit: Payer: 59 | Attending: Student | Admitting: Student

## 2022-04-04 ENCOUNTER — Encounter: Payer: Self-pay | Admitting: Student

## 2022-04-04 VITALS — BP 122/68 | HR 73 | Ht 62.0 in | Wt 169.8 lb

## 2022-04-04 DIAGNOSIS — R001 Bradycardia, unspecified: Secondary | ICD-10-CM

## 2022-04-04 DIAGNOSIS — I10 Essential (primary) hypertension: Secondary | ICD-10-CM | POA: Diagnosis not present

## 2022-04-04 DIAGNOSIS — I25119 Atherosclerotic heart disease of native coronary artery with unspecified angina pectoris: Secondary | ICD-10-CM | POA: Diagnosis not present

## 2022-04-04 DIAGNOSIS — I5022 Chronic systolic (congestive) heart failure: Secondary | ICD-10-CM

## 2022-04-04 LAB — CUP PACEART INCLINIC DEVICE CHECK
Battery Remaining Longevity: 39 mo
Brady Statistic RA Percent Paced: 90 %
Brady Statistic RV Percent Paced: 96 %
Date Time Interrogation Session: 20231024110534
HighPow Impedance: 72 Ohm
Implantable Lead Connection Status: 753985
Implantable Lead Connection Status: 753985
Implantable Lead Connection Status: 753985
Implantable Lead Implant Date: 20200205
Implantable Lead Implant Date: 20200205
Implantable Lead Implant Date: 20200205
Implantable Lead Location: 753858
Implantable Lead Location: 753859
Implantable Lead Location: 753860
Implantable Pulse Generator Implant Date: 20200205
Lead Channel Impedance Value: 1037.5 Ohm
Lead Channel Impedance Value: 487.5 Ohm
Lead Channel Impedance Value: 525 Ohm
Lead Channel Pacing Threshold Amplitude: 0.625 V
Lead Channel Pacing Threshold Amplitude: 1 V
Lead Channel Pacing Threshold Amplitude: 1 V
Lead Channel Pacing Threshold Amplitude: 1.25 V
Lead Channel Pacing Threshold Amplitude: 1.25 V
Lead Channel Pacing Threshold Pulse Width: 0.5 ms
Lead Channel Pacing Threshold Pulse Width: 0.5 ms
Lead Channel Pacing Threshold Pulse Width: 0.5 ms
Lead Channel Pacing Threshold Pulse Width: 0.5 ms
Lead Channel Pacing Threshold Pulse Width: 0.5 ms
Lead Channel Sensing Intrinsic Amplitude: 12 mV
Lead Channel Sensing Intrinsic Amplitude: 3.8 mV
Lead Channel Setting Pacing Amplitude: 2 V
Lead Channel Setting Pacing Amplitude: 2 V
Lead Channel Setting Pacing Amplitude: 2.5 V
Lead Channel Setting Pacing Pulse Width: 0.5 ms
Lead Channel Setting Pacing Pulse Width: 0.5 ms
Lead Channel Setting Sensing Sensitivity: 0.5 mV
Pulse Gen Serial Number: 9880571

## 2022-04-04 NOTE — Patient Instructions (Signed)
Medication Instructions:  Your physician recommends that you continue on your current medications as directed. Please refer to the Current Medication list given to you today.  *If you need a refill on your cardiac medications before your next appointment, please call your pharmacy*   Lab Work: None If you have labs (blood work) drawn today and your tests are completely normal, you will receive your results only by: MyChart Message (if you have MyChart) OR A paper copy in the mail If you have any lab test that is abnormal or we need to change your treatment, we will call you to review the results.   Follow-Up: At  [facility], you and your health needs are our priority.  As part of our continuing mission to provide you with exceptional heart care, we have created designated Provider Care Teams.  These Care Teams include your primary Cardiologist (physician) and Advanced Practice Providers (APPs -  Physician Assistants and Nurse Practitioners) who all work together to provide you with the care you need, when you need it.   Your next appointment:   6 month(s)  The format for your next appointment:   In Person  Provider:   Michael "Andy" Tillery, PA-C    Important Information About Sugar       

## 2022-04-09 IMAGING — DX DG CHEST 2V
2 series · 2 of 2 positions shown · non-contrast
Comparison: 06/28/2018

CLINICAL DATA: Dyspnea on exertion

EXAM:
CHEST - 2 VIEW

[chest pa]
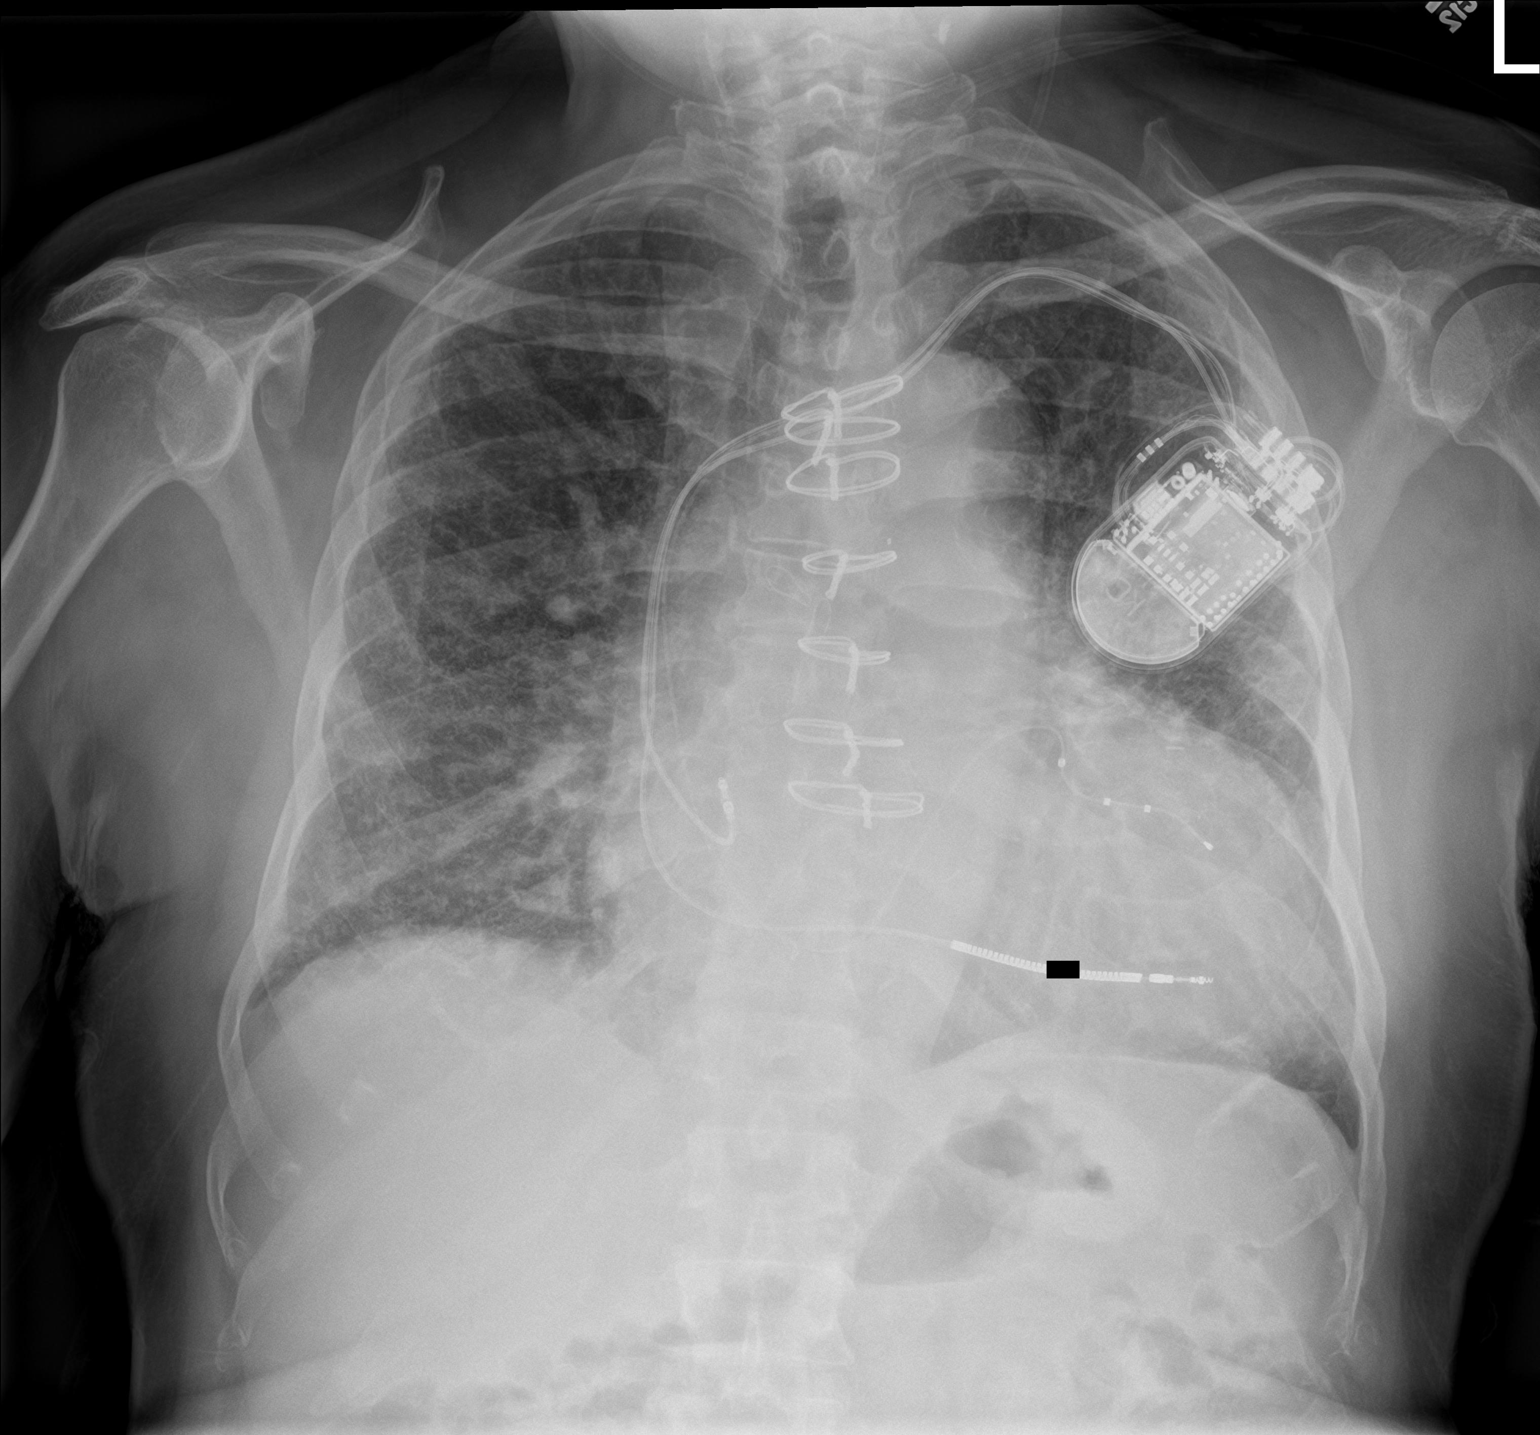

[chest lat]
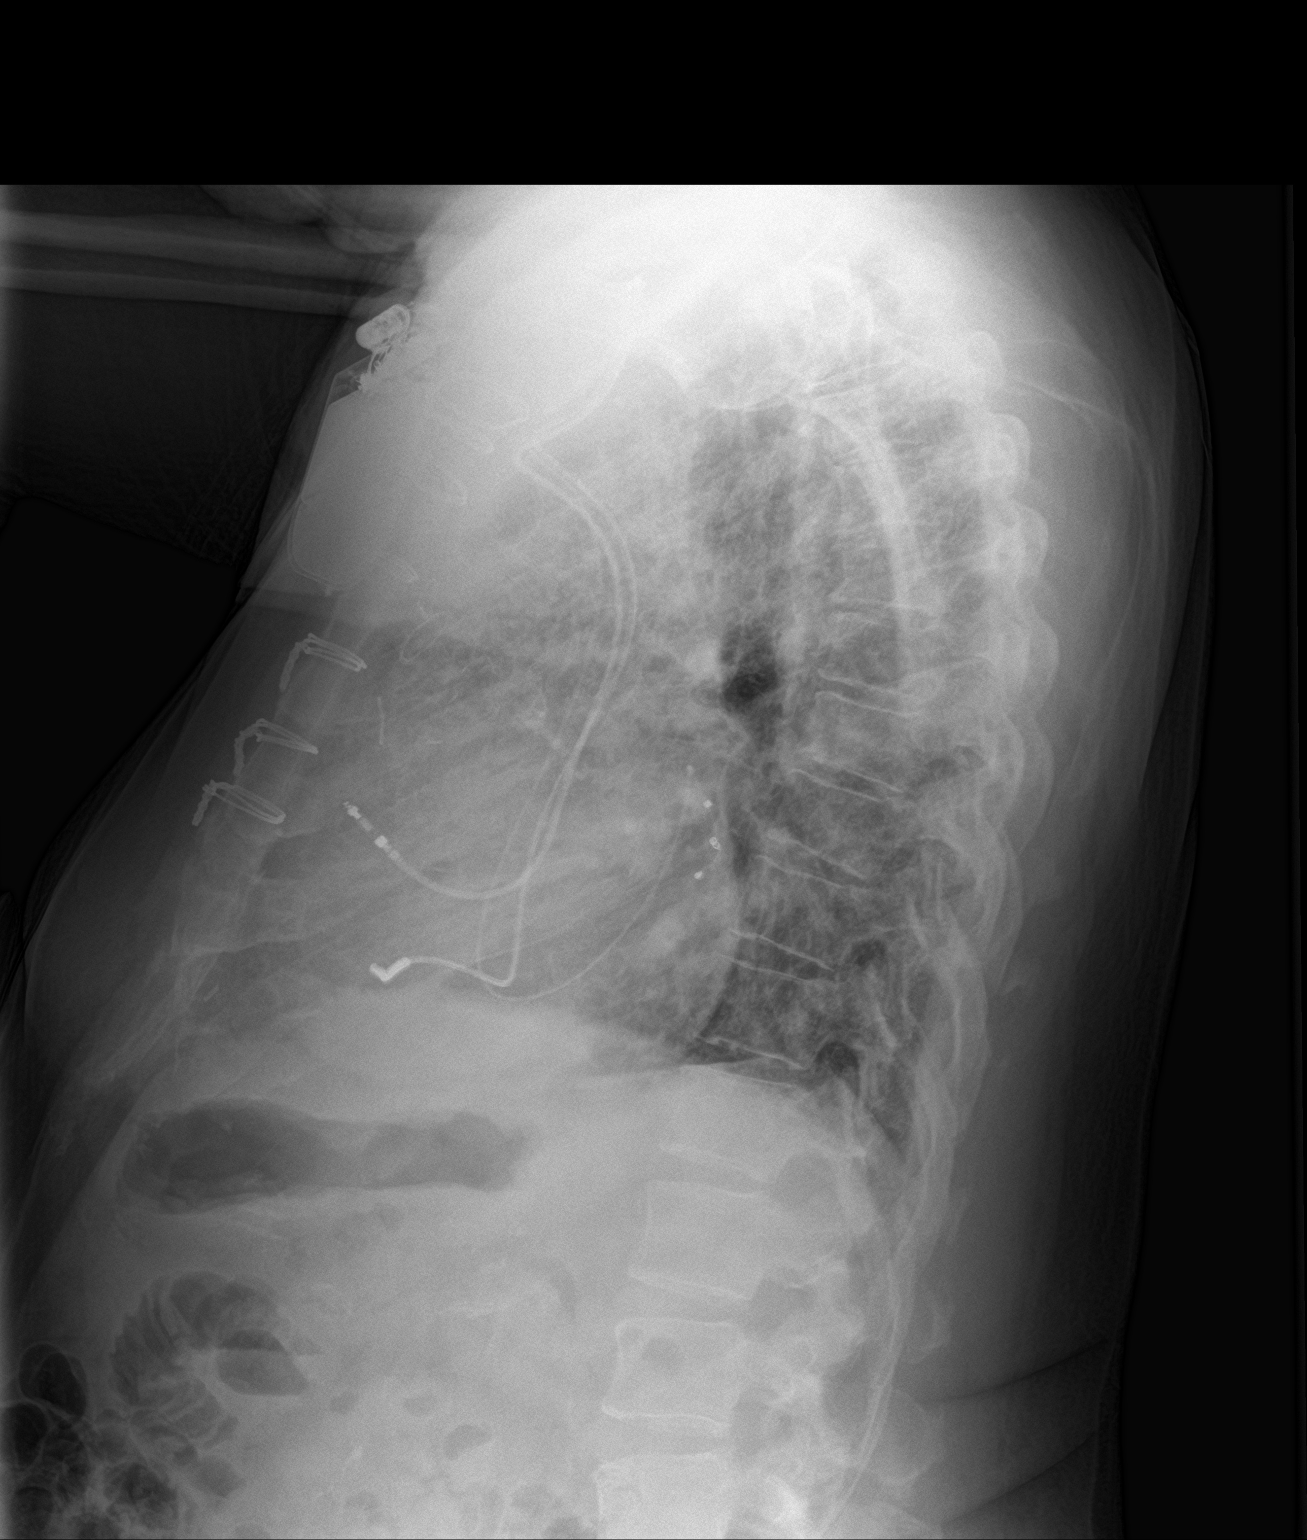

[2 of 2 positions shown; findings below may reference images not displayed]

FINDINGS: Diffuse bilateral interstitial and patchy alveolar airspace
opacities. No pleural effusion or pneumothorax. Stable cardiomegaly.
Prior CABG. 3 lead cardiac pacemaker. No acute osseous abnormality.
IMPRESSION: Mild CHF.

## 2022-04-17 ENCOUNTER — Ambulatory Visit (INDEPENDENT_AMBULATORY_CARE_PROVIDER_SITE_OTHER): Payer: Medicare PPO

## 2022-04-17 DIAGNOSIS — I5022 Chronic systolic (congestive) heart failure: Secondary | ICD-10-CM

## 2022-04-18 LAB — CUP PACEART REMOTE DEVICE CHECK
Battery Remaining Longevity: 38 mo
Battery Remaining Percentage: 46 %
Battery Voltage: 2.93 V
Brady Statistic AP VP Percent: 89 %
Brady Statistic AP VS Percent: 1.2 %
Brady Statistic AS VP Percent: 8.7 %
Brady Statistic AS VS Percent: 1 %
Brady Statistic RA Percent Paced: 88 %
Date Time Interrogation Session: 20231106010022
HighPow Impedance: 69 Ohm
HighPow Impedance: 69 Ohm
Implantable Lead Connection Status: 753985
Implantable Lead Connection Status: 753985
Implantable Lead Connection Status: 753985
Implantable Lead Implant Date: 20200205
Implantable Lead Implant Date: 20200205
Implantable Lead Implant Date: 20200205
Implantable Lead Location: 753858
Implantable Lead Location: 753859
Implantable Lead Location: 753860
Implantable Pulse Generator Implant Date: 20200205
Lead Channel Impedance Value: 1075 Ohm
Lead Channel Impedance Value: 450 Ohm
Lead Channel Impedance Value: 530 Ohm
Lead Channel Pacing Threshold Amplitude: 0.625 V
Lead Channel Pacing Threshold Amplitude: 1 V
Lead Channel Pacing Threshold Amplitude: 1.25 V
Lead Channel Pacing Threshold Pulse Width: 0.5 ms
Lead Channel Pacing Threshold Pulse Width: 0.5 ms
Lead Channel Pacing Threshold Pulse Width: 0.5 ms
Lead Channel Sensing Intrinsic Amplitude: 11.7 mV
Lead Channel Sensing Intrinsic Amplitude: 3.3 mV
Lead Channel Setting Pacing Amplitude: 2 V
Lead Channel Setting Pacing Amplitude: 2 V
Lead Channel Setting Pacing Amplitude: 2.5 V
Lead Channel Setting Pacing Pulse Width: 0.5 ms
Lead Channel Setting Pacing Pulse Width: 0.5 ms
Lead Channel Setting Sensing Sensitivity: 0.5 mV
Pulse Gen Serial Number: 9880571

## 2022-05-08 ENCOUNTER — Ambulatory Visit (INDEPENDENT_AMBULATORY_CARE_PROVIDER_SITE_OTHER): Payer: 59

## 2022-05-08 DIAGNOSIS — Z95810 Presence of automatic (implantable) cardiac defibrillator: Secondary | ICD-10-CM | POA: Diagnosis not present

## 2022-05-08 DIAGNOSIS — I5022 Chronic systolic (congestive) heart failure: Secondary | ICD-10-CM

## 2022-05-10 NOTE — Progress Notes (Signed)
EPIC Encounter for ICM Monitoring  Patient Name: Allen [name] is a 88 y.o. male Date: 05/10/2022 Primary Care Physican: [name], MD Primary Cardiologist: Diona Browner Electrophysiologist: Mealor Bi-V Pacing: 98%     01/25/2022 Weight: 173 lbs 05/10/2022 Weight: 173 lbs   AT/AF Burden <1%           Spoke with patient and heart failure questions reviewed.  Transmission results reviewed.  Pt asymptomatic for fluid accumulation.  He reports having pain after having barostim implanted.          CorVue thoracic impedance suggesting normal fluid levels with exception of possible fluid accumulation from 11/4-11/15.    Pt has Barostim.   Prescribed:  Furosemide 20 mg Take 1 tablet by mouth daily as needed. Weight gain of 2-3 lbs in a 24 hour period or 5 lbs in one week.  Pt taking Furosemide daily instead of PRN. Jardiance 10 mg take 1 tablet before breakfast   Labs: 09/21/2021 Creatinine 1.62, BUN 21, Potassium 5.2, Sodium 145, GFR 41 08/09/2021 Creatinine 1.55, BUN 19, Potassium 4.7, Sodium 144, GFR 44 07/15/2021 Creatinine 1.55, BUN 19, Potassium 4.7, Sodium 144 A complete set of results can be found in Results Review.   Recommendations:   No changes and encouraged to call if experiencing any fluid symptoms.   Follow-up plan: ICM clinic phone appointment on 06/19/2022.   91 day device clinic remote transmission 07/17/2022.     EP/Cardiology Office Visits: 06/29/2022 with Dr. Diona Browner.  Recall 10/01/2022 with [name], PA.     Copy of ICM check sent to Dr. Nelly Laurence.   3 month ICM trend: 05/08/2022.    12-14 Month ICM trend:     [name], RN 05/10/2022 9:35 AM

## 2022-05-16 NOTE — Progress Notes (Signed)
Remote ICD transmission.   

## 2022-06-02 ENCOUNTER — Encounter: Payer: 59 | Admitting: Student

## 2022-06-19 ENCOUNTER — Ambulatory Visit (INDEPENDENT_AMBULATORY_CARE_PROVIDER_SITE_OTHER): Payer: 59

## 2022-06-19 DIAGNOSIS — Z95810 Presence of automatic (implantable) cardiac defibrillator: Secondary | ICD-10-CM

## 2022-06-19 DIAGNOSIS — I5022 Chronic systolic (congestive) heart failure: Secondary | ICD-10-CM | POA: Diagnosis not present

## 2022-06-19 NOTE — Progress Notes (Signed)
EPIC Encounter for ICM Monitoring  Patient Name: Allen [name] is a 89 y.o. male Date: 06/19/2022 Primary Care Physican: Neale Burly, MD Primary Cardiologist: Domenic Polite Electrophysiologist: Mealor Bi-V Pacing: 98%     01/25/2022 Weight: 173 lbs 05/10/2022 Weight: 173 lbs 06/19/2022 Weight: 173-174 lbs   AT/AF Burden <1%           Spoke with patient and heart failure questions reviewed.  Transmission results reviewed.  Pt reports he knows he has fluid because his clothes are fitting tighter.          CorVue thoracic impedance suggesting possible fluid accumulation starting 12/30.    Pt has Barostim.   Prescribed:  Furosemide 20 mg Take 1 tablet by mouth daily as needed. Weight gain of 2-3 lbs in a 24 hour period or 5 lbs in one week.  Pt taking Furosemide daily instead of PRN. Jardiance 10 mg take 1 tablet before breakfast   Labs: 09/21/2021 Creatinine 1.62, BUN 21, Potassium 5.2, Sodium 145, GFR 41 08/09/2021 Creatinine 1.55, BUN 19, Potassium 4.7, Sodium 144, GFR 44 07/15/2021 Creatinine 1.55, BUN 19, Potassium 4.7, Sodium 144 A complete set of results can be found in Results Review.   Recommendations:   Advised to limit salt intake.  Copy sent to Dr Domenic Polite for review and recommendations.  Pt takes Furosemide daily instead of PRN.   Follow-up plan: ICM clinic phone appointment on 06/26/2022 to recheck fluid levels.   91 day device clinic remote transmission 07/17/2022.     EP/Cardiology Office Visits: 06/29/2022 with Dr. Domenic Polite.  Recall 10/01/2022 with [name], PA.     Copy of ICM check sent to Dr. Myles Gip.   3 month ICM trend: 06/19/2022.    12-14 Month ICM trend:     Rosalene Billings, RN 06/19/2022 4:47 PM

## 2022-06-26 ENCOUNTER — Ambulatory Visit (INDEPENDENT_AMBULATORY_CARE_PROVIDER_SITE_OTHER): Payer: 59

## 2022-06-26 DIAGNOSIS — Z95810 Presence of automatic (implantable) cardiac defibrillator: Secondary | ICD-10-CM

## 2022-06-26 DIAGNOSIS — I5022 Chronic systolic (congestive) heart failure: Secondary | ICD-10-CM

## 2022-06-27 NOTE — Progress Notes (Signed)
EPIC Encounter for ICM Monitoring  Patient Name: Allen [name] is a 89 y.o. male Date: 06/27/2022 Primary Care Physican: Neale Burly, MD Primary Cardiologist: Domenic Polite Electrophysiologist: Mealor Bi-V Pacing: 98%     01/25/2022 Weight: 173 lbs 05/10/2022 Weight: 173 lbs 06/19/2022 Weight: 173-174 lbs   AT/AF Burden <1%           Spoke with patient and heart failure questions reviewed.  Transmission results reviewed.  Pt asymptomatic for fluid accumulation.  Reports feeling well at this time and voices no complaints.            CorVue thoracic impedance suggesting fluid levels returned to normal after taking PRN Furosemide    Pt has Barostim.   Prescribed:  Furosemide 20 mg Take 1 tablet by mouth daily as needed. Weight gain of 2-3 lbs in a 24 hour period or 5 lbs in one week.   Jardiance 10 mg take 1 tablet before breakfast   Labs: 09/21/2021 Creatinine 1.62, BUN 21, Potassium 5.2, Sodium 145, GFR 41 08/09/2021 Creatinine 1.55, BUN 19, Potassium 4.7, Sodium 144, GFR 44 07/15/2021 Creatinine 1.55, BUN 19, Potassium 4.7, Sodium 144 A complete set of results can be found in Results Review.   Recommendations:   No changes and encouraged to call if experiencing any fluid symptoms.   Follow-up plan: ICM clinic phone appointment on 07/24/2022.   91 day device clinic remote transmission 07/17/2022.     EP/Cardiology Office Visits: 06/29/2022 with Dr. Domenic Polite.  Recall 10/01/2022 with [name], PA.     Copy of ICM check sent to Dr. Myles Gip.   3 month ICM trend: 06/27/2022.    12-14 Month ICM trend:     Rosalene Billings, RN 06/27/2022 8:28 AM

## 2022-06-28 NOTE — Progress Notes (Signed)
Cardiology Office Note  Date: 06/29/2022   ID: [name], DOB 06/05/1934, MRN [id number]  PCP:  Allen Deiters, MD  Cardiologist:  [name], MD Electrophysiologist:  [name], MD   Chief Complaint  Patient presents with   Cardiac follow-up    History of Present Illness: Allen [name] is an 89 y.o. male last seen in July 2023.  He has had interval follow-up with EP.  He is here for a routine visit.  Reports stable NYHA class II-III symptoms as before.  He does not describe any palpitations or syncope, no angina or nitroglycerin use.  St. Jude biventricular ICD in place with follow-up by Dr. Nelly [name] at this point.  Recent device interrogation indicated normal function.  Recent thoracic impedance indicated controlled fluid levels with as needed Lasix use  Barostim Neo device in place per Dr. Myra [name].  I went over his cardiac regimen which is otherwise stable and outlined below.  He will see his PCP in the next few months for repeat lab work.  Reports no change in current diuretic regimen, tends to avoid Lasix use over the weekends.   Past Medical History:  Diagnosis Date   AICD (automatic cardioverter/defibrillator) present    Biventricular ICD (implantable cardioverter-defibrillator) in place 07/17/2018   CAD (coronary artery disease)    a. 04/2016: s/p CABG with LIMA-LAD, SVG-D1, SVG-RCA, and seq SVG-OM1-OM2   CHF (congestive heart failure) (HCC)    CKD (chronic kidney disease)    Essential hypertension    Hyperlipidemia    Ischemic cardiomyopathy    LVEF 35% May 2016   LBBB (left bundle branch block)    Pneumonia due to COVID-19 virus    Sinus bradycardia    Type 2 diabetes mellitus (HCC)     Current Outpatient Medications  Medication Sig Dispense Refill   aspirin 81 MG EC tablet Take 81 mg by mouth daily.     atorvastatin (LIPITOR) 80 MG tablet Take 1 tablet (80 mg total) by mouth daily at 6 PM. 90 tablet 3   carvedilol (COREG) 6.25 MG tablet  TAKE 1 TABLET(6.25 MG) BY MOUTH TWICE DAILY WITH A MEAL 180 tablet 3   clopidogrel (PLAVIX) 75 MG tablet TAKE 1 TABLET BY MOUTH EVERY DAY WITH BREAKFAST 90 tablet 1   ENTRESTO 49-51 MG TAKE 1 TABLET BY MOUTH TWICE DAILY 180 tablet 3   furosemide (LASIX) 20 MG tablet Take 20 mg by mouth daily as needed (Weight gain of 2-3 lbs in a 24 hour period or 5 lbs in one week).     ibuprofen (ADVIL) 800 MG tablet Take 800 mg by mouth every 6 (six) hours as needed.     Insulin Glargine (LANTUS SOLOSTAR) 100 UNIT/ML Solostar Pen Inject 20 Units into the skin at bedtime. 15 mL 11   ipratropium (ATROVENT) 0.03 % nasal spray Place into both nostrils.     JARDIANCE 10 MG TABS tablet TAKE 1 TABLET(10 MG) BY MOUTH DAILY BEFORE BREAKFAST 30 tablet 6   methocarbamol (ROBAXIN) 500 MG tablet Take 500 mg by mouth daily.     nitroGLYCERIN (NITROSTAT) 0.4 MG SL tablet Place 0.4 mg under the tongue every 5 (five) minutes as needed for chest pain (If no relief after 3rd dose, proceed to the ED).     OXYGEN Inhale 3 L into the lungs continuous. 3 lpm 24/7 DME- [facility]     repaglinide (PRANDIN) 2 MG tablet Take 2 mg by mouth 2 (two) times daily before  a meal.     vitamin B-12 (CYANOCOBALAMIN) 1000 MCG tablet Take 1,000 mcg by mouth daily.     No current facility-administered medications for this visit.   Allergies:  Patient has no known allergies.   ROS: No syncope, no orthopnea or PND.  Uses oxygen at nighttime.  Physical Exam: VS:  BP 132/80   Pulse 85   Ht 5\' 2"  (1.575 m)   Wt 179 lb 12.8 oz (81.6 kg)   SpO2 92%   BMI 32.89 kg/m , BMI Body mass index is 32.89 kg/m.  Wt Readings from Last 3 Encounters:  06/29/22 179 lb 12.8 oz (81.6 kg)  04/04/22 169 lb 12.8 oz (77 kg)  03/07/22 175 lb (79.4 kg)    General: Patient appears comfortable at rest. HEENT: Conjunctiva and lids normal. Neck: Supple, no elevated JVP or carotid bruits. Lungs: Clear to auscultation, nonlabored breathing at rest. Cardiac: Regular  rate and rhythm, no S3, 2/6 systolic murmur. Extremities: No pitting edema.  ECG:  An ECG dated 03/07/2022 was personally reviewed today and demonstrated:  Dual-chamber pacing.  Recent Labwork: 09/21/2021: BUN 17; Creatinine, Ser 1.70; NT-Pro BNP 691; Potassium 4.8; Sodium 143  June 2023: BUN 22, creatinine 1.48, potassium 5.1, cholesterol 136, triglycerides 80, HDL 41, LDL 79, hemoglobin A1c 6.9%  Other Studies Reviewed Today:  Echocardiogram 12/20/2021:  1. Left ventricular ejection fraction, by estimation, is 35 to 40%. The  left ventricle has moderately decreased function. The left ventricle  demonstrates global hypokinesis. There is severe left ventricular  hypertrophy. Left ventricular diastolic  parameters are consistent with Grade I diastolic dysfunction (impaired  relaxation).   2. Right ventricular systolic function is normal. The right ventricular  size is normal.   3. The mitral valve is abnormal. Mild mitral valve regurgitation. No  evidence of mitral stenosis.   4. The tricuspid valve is abnormal.   5. The aortic valve is tricuspid. There is moderate calcification of the  aortic valve. There is moderate thickening of the aortic valve. Aortic  valve regurgitation is moderate. Mild aortic valve stenosis.   6. Aortic dilatation noted. There is mild dilatation of the ascending  aorta, measuring 39 mm.   7. The inferior vena cava is normal in size with greater than 50%  respiratory variability, suggesting right atrial pressure of 3 mmHg.   Assessment and Plan:  1.  HFrEF with ischemic cardiomyopathy, LVEF 35 to 40% as of July 2023.  Stable with NYHA class II-III dyspnea also complicated by chronic hypoxic respiratory failure with intermittent oxygen use.  Weight is up although recent thoracic impedance suggested good fluid status.  Plan to continue current regimen including Coreg, Entresto, Jardiance, and Lasix.  We have held off Aldactone with CKD stage IIIb and high normal  potassium levels.  2.  St. Jude biventricular ICD in place with follow-up by Dr. Myles [name].  No recent device shocks or syncope.  3.  Multivessel CAD status post CABG.  He does not report any angina or nitroglycerin use.  Continue aspirin and Lipitor.  4.  Degenerative calcific aortic stenosis, mild by echocardiogram in July of last year and associated with moderate aortic regurgitation and mildly dilated ascending aorta of 39 mm.  Continue surveillance over time.  5.  CKD stage IIIb, last creatinine 1.48.  Potassium 5.1.  Medication Adjustments/Labs and Tests Ordered: Current medicines are reviewed at length with the patient today.  Concerns regarding medicines are outlined above.   Tests Ordered: No orders of the defined types  were placed in this encounter.   Medication Changes: No orders of the defined types were placed in this encounter.   Disposition:  Follow up  6 months.  Signed, Satira Sark, MD, [facility] 06/29/2022 10:03 AM    [facility] [place], [place], [place] [address] Phone: [phone]; Fax: [phone]

## 2022-06-29 ENCOUNTER — Ambulatory Visit: Payer: 59 | Attending: Cardiology | Admitting: Cardiology

## 2022-06-29 ENCOUNTER — Encounter: Payer: Self-pay | Admitting: Cardiology

## 2022-06-29 VITALS — BP 132/80 | HR 85 | Ht 62.0 in | Wt 179.8 lb

## 2022-06-29 DIAGNOSIS — I5020 Unspecified systolic (congestive) heart failure: Secondary | ICD-10-CM | POA: Diagnosis not present

## 2022-06-29 DIAGNOSIS — N1832 Chronic kidney disease, stage 3b: Secondary | ICD-10-CM

## 2022-06-29 DIAGNOSIS — I25119 Atherosclerotic heart disease of native coronary artery with unspecified angina pectoris: Secondary | ICD-10-CM

## 2022-06-29 NOTE — Patient Instructions (Signed)

## 2022-07-17 ENCOUNTER — Ambulatory Visit: Payer: 59

## 2022-07-17 DIAGNOSIS — I447 Left bundle-branch block, unspecified: Secondary | ICD-10-CM | POA: Diagnosis not present

## 2022-07-18 LAB — CUP PACEART REMOTE DEVICE CHECK
Battery Remaining Longevity: 36 mo
Battery Remaining Percentage: 43 %
Battery Voltage: 2.93 V
Brady Statistic AP VP Percent: 92 %
Brady Statistic AP VS Percent: 1 %
Brady Statistic AS VP Percent: 5.3 %
Brady Statistic AS VS Percent: 1 %
Brady Statistic RA Percent Paced: 91 %
Date Time Interrogation Session: 20240205020016
HighPow Impedance: 68 Ohm
HighPow Impedance: 68 Ohm
Implantable Lead Connection Status: 753985
Implantable Lead Connection Status: 753985
Implantable Lead Connection Status: 753985
Implantable Lead Implant Date: 20200205
Implantable Lead Implant Date: 20200205
Implantable Lead Implant Date: 20200205
Implantable Lead Location: 753858
Implantable Lead Location: 753859
Implantable Lead Location: 753860
Implantable Pulse Generator Implant Date: 20200205
Lead Channel Impedance Value: 1050 Ohm
Lead Channel Impedance Value: 450 Ohm
Lead Channel Impedance Value: 510 Ohm
Lead Channel Pacing Threshold Amplitude: 0.625 V
Lead Channel Pacing Threshold Amplitude: 1 V
Lead Channel Pacing Threshold Amplitude: 1.25 V
Lead Channel Pacing Threshold Pulse Width: 0.5 ms
Lead Channel Pacing Threshold Pulse Width: 0.5 ms
Lead Channel Pacing Threshold Pulse Width: 0.5 ms
Lead Channel Sensing Intrinsic Amplitude: 12 mV
Lead Channel Sensing Intrinsic Amplitude: 4 mV
Lead Channel Setting Pacing Amplitude: 2 V
Lead Channel Setting Pacing Amplitude: 2 V
Lead Channel Setting Pacing Amplitude: 2.5 V
Lead Channel Setting Pacing Pulse Width: 0.5 ms
Lead Channel Setting Pacing Pulse Width: 0.5 ms
Lead Channel Setting Sensing Sensitivity: 0.5 mV
Pulse Gen Serial Number: 9880571

## 2022-07-19 ENCOUNTER — Telehealth: Payer: Self-pay | Admitting: Cardiology

## 2022-07-19 MED ORDER — CLOPIDOGREL BISULFATE 75 MG PO TABS
ORAL_TABLET | ORAL | 2 refills | Status: DC
Start: 2022-07-19 — End: 2023-07-02

## 2022-07-19 NOTE — Telephone Encounter (Signed)
*  STAT* If patient is at the pharmacy, call can be transferred to refill team.   1. Which medications need to be refilled? (please list name of each medication and dose if known)   clopidogrel (PLAVIX) 75 MG tablet    2. Which pharmacy/location (including street and city if local pharmacy) is medication to be sent to?  [id number] - [place]   3. Do they need a 30 day or 90 day supply?  [place]

## 2022-07-24 ENCOUNTER — Ambulatory Visit: Payer: 59 | Attending: Cardiovascular Disease

## 2022-07-24 DIAGNOSIS — Z95810 Presence of automatic (implantable) cardiac defibrillator: Secondary | ICD-10-CM

## 2022-07-24 DIAGNOSIS — I5022 Chronic systolic (congestive) heart failure: Secondary | ICD-10-CM | POA: Diagnosis not present

## 2022-07-27 NOTE — Progress Notes (Signed)
EPIC Encounter for ICM Monitoring  Patient Name: Allen [name] is a 89 y.o. male Date: 07/27/2022 Primary Care Physican: Neale Burly, MD Primary Cardiologist: Domenic Polite Electrophysiologist: Mealor Bi-V Pacing: 98%     01/25/2022 Weight: 173 lbs 05/10/2022 Weight: 173 lbs 06/19/2022 Weight: 173-174 lbs 07/27/2022 Weight: 173 lbs   AT/AF Burden <1%           Spoke with patient and heart failure questions reviewed.  Transmission results reviewed.  Pt asymptomatic for fluid accumulation.  Reports feeling well at this time and voices no complaints.             CorVue thoracic impedance suggesting normal fluid levels.    Pt has Barostim.   Prescribed:  Furosemide 20 mg Take 1 tablet by mouth daily as needed. Weight gain of 2-3 lbs in a 24 hour period or 5 lbs in one week.   Jardiance 10 mg take 1 tablet before breakfast   Labs: 09/21/2021 Creatinine 1.62, BUN 21, Potassium 5.2, Sodium 145, GFR 41 08/09/2021 Creatinine 1.55, BUN 19, Potassium 4.7, Sodium 144, GFR 44 07/15/2021 Creatinine 1.55, BUN 19, Potassium 4.7, Sodium 144 A complete set of results can be found in Results Review.   Recommendations:  No changes and encouraged to call if experiencing any fluid symptoms.   Follow-up plan: ICM clinic phone appointment on 08/28/2022.   91 day device clinic remote transmission 10/16/2022.     EP/Cardiology Office Visits:  12/13/2022 with Dr. Domenic Polite.  Recall 10/01/2022 with [name], PA.     Copy of ICM check sent to Dr. Myles Gip.   3 month ICM trend: 07/24/2022.    12-14 Month ICM trend:     Rosalene Billings, RN 07/27/2022 9:36 AM

## 2022-08-17 IMAGING — DX DG CHEST 2V
2 series · 2 of 2 positions shown · non-contrast
Comparison: 09/29/2019

CLINICAL DATA: Shortness of breath for 3 weeks, history open heart
surgery, pacemaker, hypertension, diabetes mellitus, ischemic
cardiomyopathy

EXAM:
CHEST - 2 VIEW

[chest pa]
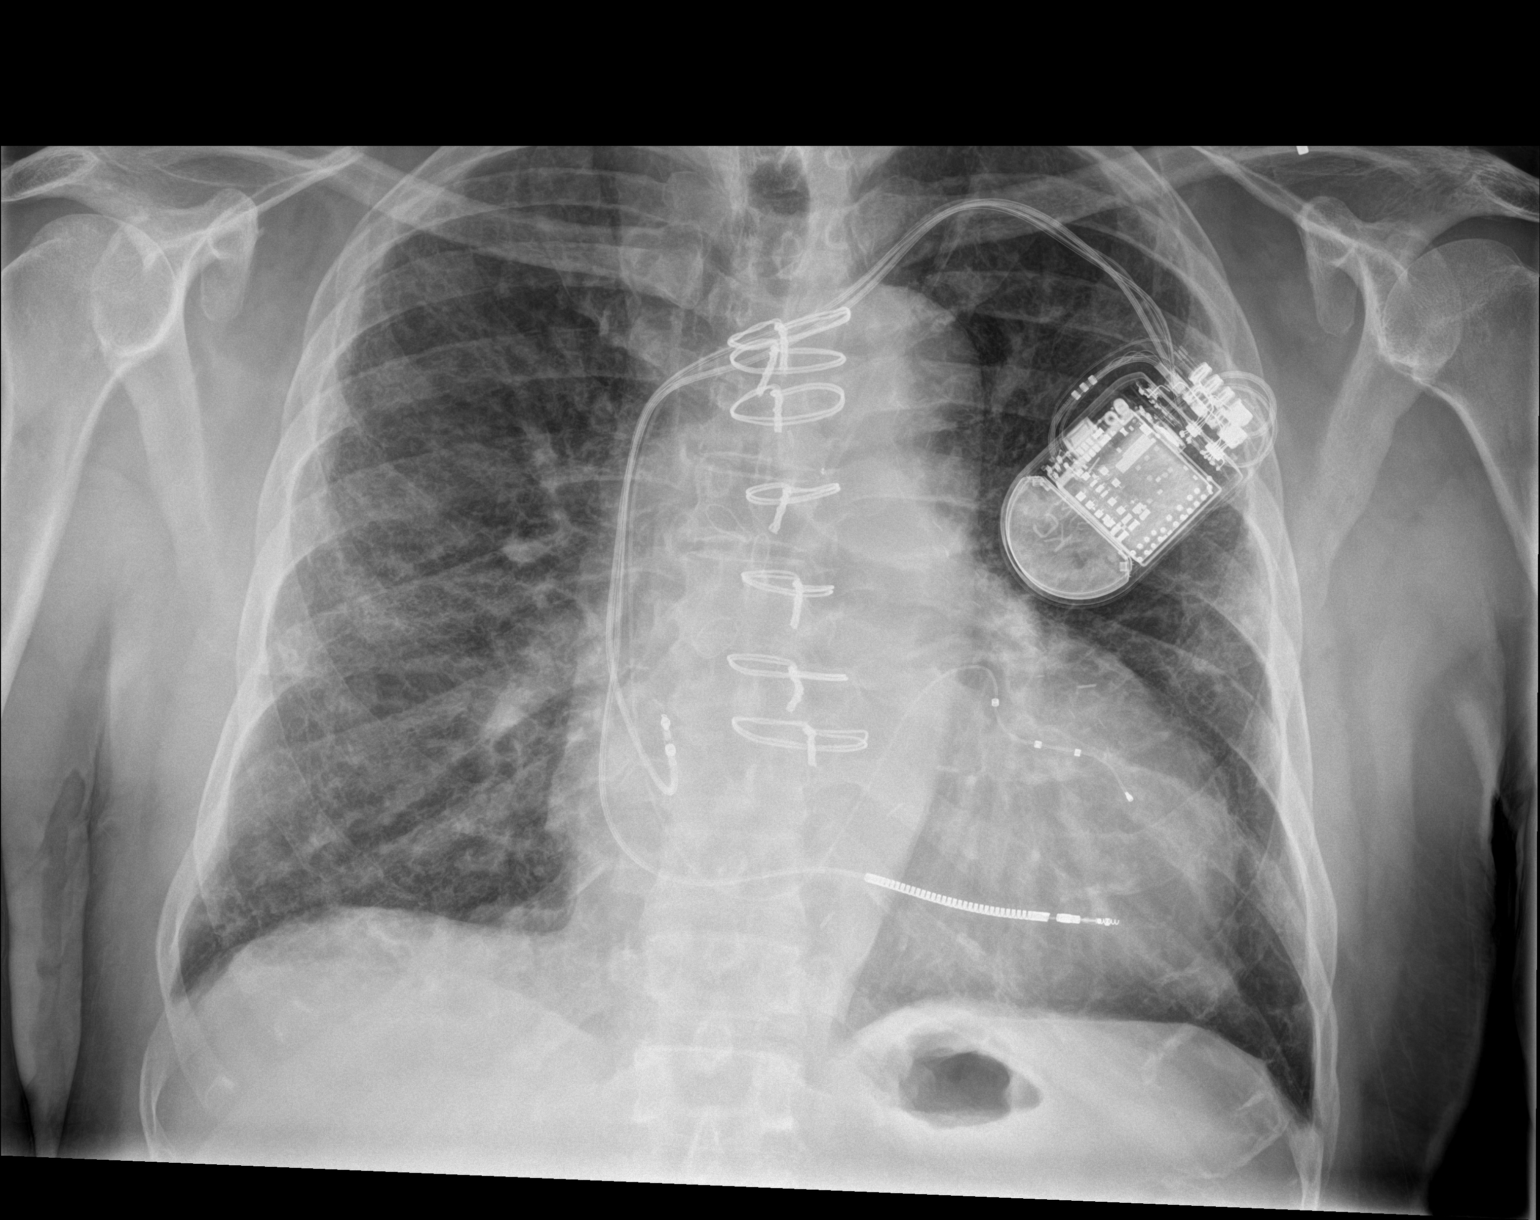

[chest lat]
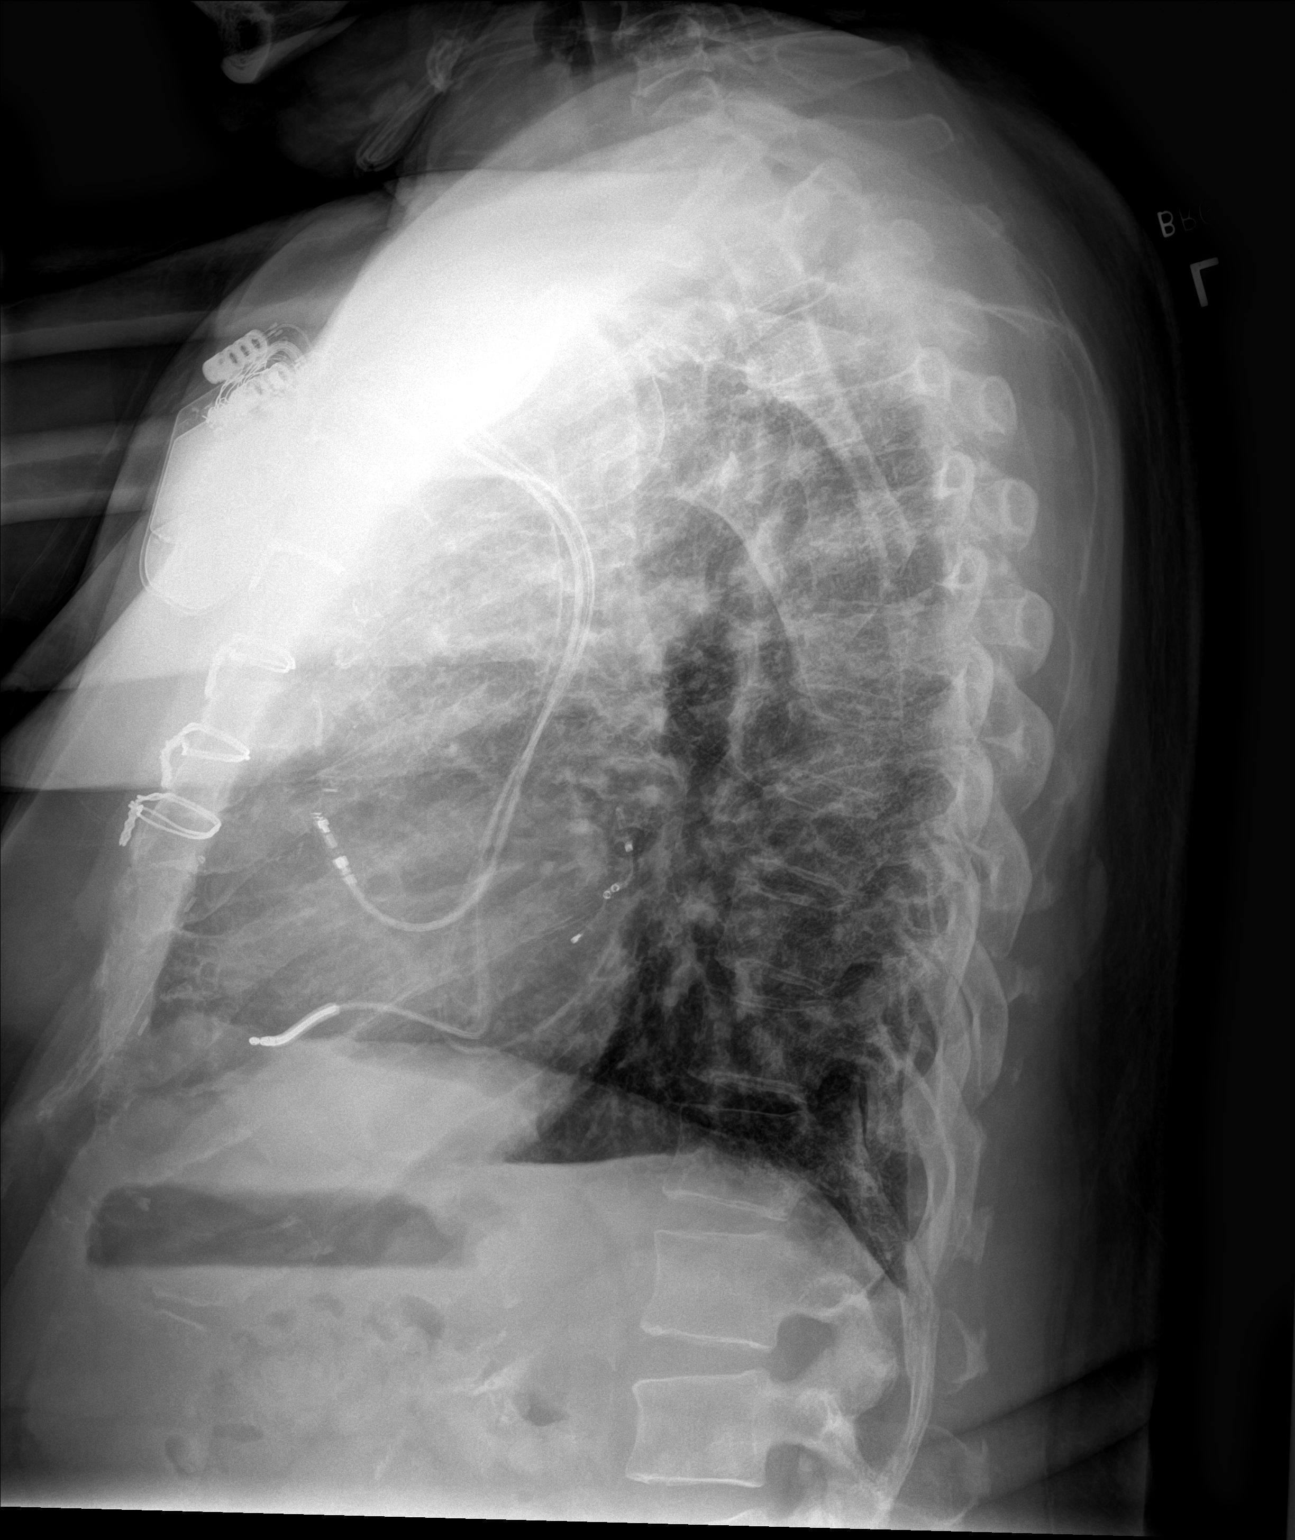

[2 of 2 positions shown; findings below may reference images not displayed]

FINDINGS: LEFT subclavian ICD with leads projecting at RIGHT atrium, RIGHT
ventricle, and coronary sinus, unchanged.

Enlargement of cardiac silhouette post CABG.

Pulmonary vascular congestion.

Mediastinal contours normal.

Persistent accentuation of interstitial markings throughout both
lungs RIGHT greater than LEFT question mild pulmonary edema.

No pleural effusion or pneumothorax.

Osseous structures unremarkable.
IMPRESSION: Post CABG and ICD placement.

Enlargement of cardiac silhouette with vascular congestion and
probable mild persistent pulmonary edema.

## 2022-08-28 ENCOUNTER — Ambulatory Visit: Payer: 59 | Attending: Cardiovascular Disease

## 2022-08-28 DIAGNOSIS — Z95810 Presence of automatic (implantable) cardiac defibrillator: Secondary | ICD-10-CM | POA: Diagnosis not present

## 2022-08-28 DIAGNOSIS — I5022 Chronic systolic (congestive) heart failure: Secondary | ICD-10-CM | POA: Diagnosis not present

## 2022-08-28 NOTE — Progress Notes (Unsigned)
EPIC Encounter for ICM Monitoring  Patient Name: Allen [name] is a 89 y.o. male Date: 08/28/2022 Primary Care Physican: Neale Burly, MD Primary Cardiologist: Domenic Polite Electrophysiologist: Mealor Bi-V Pacing: 98%     01/25/2022 Weight: 173 lbs 05/10/2022 Weight: 173 lbs 06/19/2022 Weight: 173-174 lbs 07/27/2022 Weight: 173 lbs   AT/AF Burden <1%           Attempted call to patient and he requested a call back later today.  Transmission reviewed.           CorVue thoracic impedance suggesting possible fluid accumulation starting 3/15.    Pt has Barostim.   Prescribed:  Furosemide 20 mg Take 1 tablet by mouth daily as needed. Weight gain of 2-3 lbs in a 24 hour period or 5 lbs in one week.   Jardiance 10 mg take 1 tablet before breakfast   Labs: 09/21/2021 Creatinine 1.62, BUN 21, Potassium 5.2, Sodium 145, GFR 41 08/09/2021 Creatinine 1.55, BUN 19, Potassium 4.7, Sodium 144, GFR 44 07/15/2021 Creatinine 1.55, BUN 19, Potassium 4.7, Sodium 144 A complete set of results can be found in Results Review.   Recommendations:  Unable to reach.     Follow-up plan: ICM clinic phone appointment on 09/04/2022 to recheck fluid levels.   91 day device clinic remote transmission 10/16/2022.     EP/Cardiology Office Visits:  12/13/2022 with Dr. Domenic Polite.  Recall 10/01/2022 with [name], PA.     Copy of ICM check sent to Dr. Myles Gip.   3 month ICM trend: 08/28/2022.    12-14 Month ICM trend:     Rosalene Billings, RN 08/28/2022 12:36 PM

## 2022-08-29 NOTE — Progress Notes (Signed)
Spoke with patient and heart failure questions reviewed.  Transmission results reviewed.  Pt reports he usually Furosemide daily instead of PRN and missed several dosages over the weekend due to events.  He resumed daily dose yesterday and feeling fine.  Weight remains stable but not sure of exact reading.  Advised will recheck fluid levels next week.

## 2022-08-30 NOTE — Progress Notes (Signed)
Remote ICD transmission.   

## 2022-09-04 ENCOUNTER — Ambulatory Visit: Payer: 59 | Attending: Cardiovascular Disease

## 2022-09-04 DIAGNOSIS — Z95810 Presence of automatic (implantable) cardiac defibrillator: Secondary | ICD-10-CM

## 2022-09-04 DIAGNOSIS — I5022 Chronic systolic (congestive) heart failure: Secondary | ICD-10-CM

## 2022-09-05 NOTE — Progress Notes (Signed)
EPIC Encounter for ICM Monitoring  Patient Name: Allen [name] is a 89 y.o. male Date: 09/05/2022 Primary Care Physican: Neale Burly, MD Primary Cardiologist: Domenic Polite Electrophysiologist: Mealor Bi-V Pacing: 98%     06/19/2022 Weight: 173-174 lbs 07/27/2022 Weight: 173 lbs 09/05/2022 Weight: 173 lbs   AT/AF Burden <1%           Spoke with patient and heart failure questions reviewed.  Transmission results reviewed.  Pt asymptomatic for fluid accumulation.  Reports feeling well at this time and voices no complaints.          CorVue thoracic impedance suggesting fluid levels returned to normal.    Pt has Barostim.   Prescribed:  Furosemide 20 mg Take 1 tablet by mouth daily as needed. Weight gain of 2-3 lbs in a 24 hour period or 5 lbs in one week.   Jardiance 10 mg take 1 tablet before breakfast   Labs: 09/21/2021 Creatinine 1.62, BUN 21, Potassium 5.2, Sodium 145, GFR 41 08/09/2021 Creatinine 1.55, BUN 19, Potassium 4.7, Sodium 144, GFR 44 07/15/2021 Creatinine 1.55, BUN 19, Potassium 4.7, Sodium 144 A complete set of results can be found in Results Review.   Recommendations: No changes and encouraged to call if experiencing any fluid symptoms.   Follow-up plan: ICM clinic phone appointment on 10/02/2022.   91 day device clinic remote transmission 10/16/2022.     EP/Cardiology Office Visits:  12/13/2022 with Dr. Domenic Polite.  Recall 10/01/2022 with [name], PA.     Copy of ICM check sent to Dr. Myles Gip.   3 month ICM trend: 09/04/2022.    12-14 Month ICM trend:     Rosalene Billings, RN 09/05/2022 2:51 PM

## 2022-10-02 ENCOUNTER — Ambulatory Visit: Payer: 59 | Attending: Cardiovascular Disease

## 2022-10-02 DIAGNOSIS — I5022 Chronic systolic (congestive) heart failure: Secondary | ICD-10-CM

## 2022-10-02 DIAGNOSIS — Z95810 Presence of automatic (implantable) cardiac defibrillator: Secondary | ICD-10-CM

## 2022-10-06 NOTE — Progress Notes (Signed)
EPIC Encounter for ICM Monitoring  Patient Name: Allen [name] is a 89 y.o. male Date: 10/06/2022 Primary Care Physican: [name], MD Primary Cardiologist: Diona Browner Electrophysiologist: Mealor Bi-V Pacing: 98%     06/19/2022 Weight: 173-174 lbs 07/27/2022 Weight: 173 lbs 09/05/2022 Weight: 173 lbs   AT/AF Burden <1%           Spoke with patient and heart failure questions reviewed.  Transmission results reviewed.  Pt reports chronically Nikesha Kwasny of breath but no worse than usual.           CorVue thoracic impedance suggesting normal fluid levels with exception of possible fluid accumulation from 4/4-4/12.    Pt has Barostim.   Prescribed:  Furosemide 20 mg Take 1 tablet by mouth daily as needed. Weight gain of 2-3 lbs in a 24 hour period or 5 lbs in one week.   Jardiance 10 mg take 1 tablet before breakfast   Labs: 09/21/2021 Creatinine 1.62, BUN 21, Potassium 5.2, Sodium 145, GFR 41 08/09/2021 Creatinine 1.55, BUN 19, Potassium 4.7, Sodium 144, GFR 44 07/15/2021 Creatinine 1.55, BUN 19, Potassium 4.7, Sodium 144 A complete set of results can be found in Results Review.   Recommendations: No changes and encouraged to call if experiencing any fluid symptoms.   Follow-up plan: ICM clinic phone appointment on 11/07/2022.   91 day device clinic remote transmission 10/16/2022.     EP/Cardiology Office Visits:  12/13/2022 with Dr. Diona Browner.  10/09/2022 with [name], PA.     Copy of ICM check sent to Dr. Nelly Laurence.   3 month ICM trend: 10/02/2022.    12-14 Month ICM trend:     [name], RN 10/06/2022 8:32 AM

## 2022-10-09 ENCOUNTER — Ambulatory Visit: Payer: 59 | Admitting: Student

## 2022-10-16 ENCOUNTER — Ambulatory Visit (INDEPENDENT_AMBULATORY_CARE_PROVIDER_SITE_OTHER): Payer: 59

## 2022-10-16 DIAGNOSIS — I5022 Chronic systolic (congestive) heart failure: Secondary | ICD-10-CM | POA: Diagnosis not present

## 2022-10-16 DIAGNOSIS — I447 Left bundle-branch block, unspecified: Secondary | ICD-10-CM

## 2022-10-17 LAB — CUP PACEART REMOTE DEVICE CHECK
Battery Remaining Longevity: 34 mo
Battery Remaining Percentage: 40 %
Battery Voltage: 2.93 V
Brady Statistic AP VP Percent: 93 %
Brady Statistic AP VS Percent: 1 %
Brady Statistic AS VP Percent: 5.3 %
Brady Statistic AS VS Percent: 1 %
Brady Statistic RA Percent Paced: 92 %
Date Time Interrogation Session: 20240506020025
HighPow Impedance: 61 Ohm
HighPow Impedance: 61 Ohm
Implantable Lead Connection Status: 753985
Implantable Lead Connection Status: 753985
Implantable Lead Connection Status: 753985
Implantable Lead Implant Date: 20200205
Implantable Lead Implant Date: 20200205
Implantable Lead Implant Date: 20200205
Implantable Lead Location: 753858
Implantable Lead Location: 753859
Implantable Lead Location: 753860
Implantable Pulse Generator Implant Date: 20200205
Lead Channel Impedance Value: 1025 Ohm
Lead Channel Impedance Value: 450 Ohm
Lead Channel Impedance Value: 490 Ohm
Lead Channel Pacing Threshold Amplitude: 0.625 V
Lead Channel Pacing Threshold Amplitude: 1 V
Lead Channel Pacing Threshold Amplitude: 1.25 V
Lead Channel Pacing Threshold Pulse Width: 0.5 ms
Lead Channel Pacing Threshold Pulse Width: 0.5 ms
Lead Channel Pacing Threshold Pulse Width: 0.5 ms
Lead Channel Sensing Intrinsic Amplitude: 12 mV
Lead Channel Sensing Intrinsic Amplitude: 3.5 mV
Lead Channel Setting Pacing Amplitude: 2 V
Lead Channel Setting Pacing Amplitude: 2 V
Lead Channel Setting Pacing Amplitude: 2.5 V
Lead Channel Setting Pacing Pulse Width: 0.5 ms
Lead Channel Setting Pacing Pulse Width: 0.5 ms
Lead Channel Setting Sensing Sensitivity: 0.5 mV
Pulse Gen Serial Number: 9880571

## 2022-11-06 NOTE — Progress Notes (Unsigned)
  Cardiology Office Note:   Date:  11/07/2022  ID:  KYAIR VOLOSHIN, DOB 1933/12/21, MRN [id number]  Primary Cardiologist: [name], MD Electrophysiologist: [name], MD   History of Present Illness:   Allen [name] is a 89 y.o. male seen today for routine electrophysiology followup. Since last being seen in our clinic the patient reports doing about the same.  he denies chest pain, palpitations, dyspnea, PND, orthopnea, nausea, vomiting, dizziness, syncope, edema, weight gain, or early satiety.   Device History: Barostim (standard) implanted 05/27/2021 for Chronic systolic CHF St. Jude BiV ICD implanted 07/17/2018 for ICM, NYHA III, SSS, LBBB  Review of systems complete and found to be negative unless listed in HPI.    Studies Reviewed:    EKG is not ordered today. EKG from 03/07/2022 reviewed which showed AV dual paced at 62 bpm  Barostim Interrogation- Performed personally and reviewed in detail today,  See scanned report      Physical Exam:   VS:  BP 122/64   Pulse 61   Ht 5\' 2"  (1.575 m)   Wt 167 lb 3.2 oz (75.8 kg)   SpO2 90%   BMI 30.58 kg/m    Wt Readings from Last 3 Encounters:  11/07/22 167 lb 3.2 oz (75.8 kg)  06/29/22 179 lb 12.8 oz (81.6 kg)  04/04/22 169 lb 12.8 oz (77 kg)     GEN: Well nourished, well developed in no acute distress NECK: No JVD; No carotid bruits CARDIAC: Regular rate and rhythm, no murmurs, rubs, gallops RESPIRATORY:  Clear to auscultation without rales, wheezing or rhonchi  ABDOMEN: Soft, non-tender, non-distended EXTREMITIES:  No edema; No deformity   ASSESSMENT AND PLAN:    Chronic systolic CHF s/p St. Jude and Barostim implantation NYHA III symptoms chronically, confounded by his COPD  Device programmed 7.6 ma chronically.  Device impedence stable. Pt goals are to continue to increase physical activity.   S/p AV optimization 10/27/2019. He felt somewhat better and has maintained this. V-V optimization 02/2020. QRS 176 >  122 ms after reprogramming with positive/RBBB V1 and biphasic lead 1. Previous optimization imaging was reviewed and felt appropriate to increase PAV delay at this time, with intrinsic A to R of 300 ms. Normal ICD function 03/2022. Will do full check at next visit.  See scanned report.  HTN Stable on current regimen   Vertiginous dizziness PCP follows  Chronic hypoxic respiratory failure Encouraged consistent O2 use.   C-spine arthritis Very limiting.  Not felt to be related to BAT (Pre-dates)    Disposition:   Follow up with EP APP in in 6 months  Signed, Graciella Freer, PA-C

## 2022-11-07 ENCOUNTER — Ambulatory Visit (INDEPENDENT_AMBULATORY_CARE_PROVIDER_SITE_OTHER): Payer: 59 | Admitting: Student

## 2022-11-07 ENCOUNTER — Ambulatory Visit: Payer: 59 | Attending: Cardiovascular Disease

## 2022-11-07 ENCOUNTER — Encounter: Payer: Self-pay | Admitting: Student

## 2022-11-07 VITALS — BP 122/64 | HR 61 | Ht 62.0 in | Wt 167.2 lb

## 2022-11-07 DIAGNOSIS — I5022 Chronic systolic (congestive) heart failure: Secondary | ICD-10-CM | POA: Diagnosis not present

## 2022-11-07 DIAGNOSIS — I25119 Atherosclerotic heart disease of native coronary artery with unspecified angina pectoris: Secondary | ICD-10-CM

## 2022-11-07 DIAGNOSIS — I10 Essential (primary) hypertension: Secondary | ICD-10-CM

## 2022-11-07 DIAGNOSIS — R001 Bradycardia, unspecified: Secondary | ICD-10-CM

## 2022-11-07 DIAGNOSIS — I447 Left bundle-branch block, unspecified: Secondary | ICD-10-CM

## 2022-11-07 DIAGNOSIS — Z95810 Presence of automatic (implantable) cardiac defibrillator: Secondary | ICD-10-CM | POA: Diagnosis not present

## 2022-11-07 NOTE — Patient Instructions (Signed)
Medication Instructions:  Your physician recommends that you continue on your current medications as directed. Please refer to the Current Medication list given to you today.  *If you need a refill on your cardiac medications before your next appointment, please call your pharmacy*  Lab Work: BMET, CBC--TODAY If you have labs (blood work) drawn today and your tests are completely normal, you will receive your results only by: MyChart Message (if you have MyChart) OR A paper copy in the mail If you have any lab test that is abnormal or we need to change your treatment, we will call you to review the results.  Follow-Up: At [facility], you and your health needs are our priority.  As part of our continuing mission to provide you with exceptional heart care, we have created designated Provider Care Teams.  These Care Teams include your primary Cardiologist (physician) and Advanced Practice Providers (APPs -  Physician Assistants and Nurse Practitioners) who all work together to provide you with the care you need, when you need it.  Your next appointment:   6 month(s)  Provider:   Casimiro Needle "[name], PA-C

## 2022-11-08 LAB — BASIC METABOLIC PANEL
BUN/Creatinine Ratio: 12 (ref 10–24)
BUN: 18 mg/dL (ref 8–27)
CO2: 21 mmol/L (ref 20–29)
Calcium: 10 mg/dL (ref 8.6–10.2)
Chloride: 108 mmol/L — ABNORMAL HIGH (ref 96–106)
Creatinine, Ser: 1.5 mg/dL — ABNORMAL HIGH (ref 0.76–1.27)
Glucose: 74 mg/dL (ref 70–99)
Potassium: 4.4 mmol/L (ref 3.5–5.2)
Sodium: 144 mmol/L (ref 134–144)
eGFR: 45 mL/min/{1.73_m2} — ABNORMAL LOW (ref >59)

## 2022-11-08 LAB — CBC
Hematocrit: 53.5 % — ABNORMAL HIGH (ref 37.5–51.0)
Hemoglobin: 17.3 g/dL (ref 13.0–17.7)
MCH: 30.8 pg (ref 26.6–33.0)
MCHC: 32.3 g/dL (ref 31.5–35.7)
MCV: 95 fL (ref 79–97)
Platelets: 178 10*3/uL (ref 150–450)
RBC: 5.62 x10E6/uL (ref 4.14–5.80)
RDW: 12.9 % (ref 11.6–15.4)
WBC: 7.2 10*3/uL (ref 3.4–10.8)

## 2022-11-10 NOTE — Progress Notes (Signed)
EPIC Encounter for ICM Monitoring  Patient Name: Allen [name] is a 89 y.o. male Date: 11/10/2022 Primary Care Physican: [name], MD Primary Cardiologist: Diona Browner Electrophysiologist: Mealor Bi-V Pacing: 98%     06/19/2022 Weight: 173-174 lbs 07/27/2022 Weight: 173 lbs 09/05/2022 Weight: 173 lbs 11/07/2022 Office Weight: 167 lbs   AT/AF Burden <1%           Spoke with patient and heart failure questions reviewed.  Transmission results reviewed.  Pt asymptomatic for fluid accumulation.  Reports feeling well at this time and voices no complaints.  Did not report any symptoms during decreased impedance.        CorVue thoracic impedance suggesting normal fluid levels with exception of possible fluid accumulation from 5/5-5/16 and 4/21-4/30.    Pt has Barostim.   Prescribed:  Furosemide 20 mg Take 1 tablet by mouth daily as needed. Weight gain of 2-3 lbs in a 24 hour period or 5 lbs in one week.   Jardiance 10 mg take 1 tablet before breakfast   Labs: 09/21/2021 Creatinine 1.62, BUN 21, Potassium 5.2, Sodium 145, GFR 41 08/09/2021 Creatinine 1.55, BUN 19, Potassium 4.7, Sodium 144, GFR 44 07/15/2021 Creatinine 1.55, BUN 19, Potassium 4.7, Sodium 144 A complete set of results can be found in Results Review.   Recommendations:  Recommendation to limit salt intake to 2000 mg daily and fluid intake to 64 oz daily.  Encouraged to call if experiencing any fluid symptoms.    Follow-up plan: ICM clinic phone appointment on 12/11/2022.   91 day device clinic remote transmission 01/15/2023.     EP/Cardiology Office Visits:  12/13/2022 with Dr. Diona Browner.  Recall 05/06/2023 with [name], PA.     Copy of ICM check sent to Dr. Nelly Laurence.   3 month ICM trend: 08/09/2022.    12-14 Month ICM trend:     [name], RN 11/10/2022 12:26 PM

## 2022-11-13 NOTE — Progress Notes (Signed)
Remote ICD transmission.   

## 2022-12-11 ENCOUNTER — Ambulatory Visit: Payer: 59 | Attending: Cardiovascular Disease

## 2022-12-11 DIAGNOSIS — I5022 Chronic systolic (congestive) heart failure: Secondary | ICD-10-CM | POA: Diagnosis not present

## 2022-12-11 DIAGNOSIS — Z95810 Presence of automatic (implantable) cardiac defibrillator: Secondary | ICD-10-CM

## 2022-12-11 NOTE — Progress Notes (Signed)
EPIC Encounter for ICM Monitoring  Patient Name: Allen [name] is a 89 y.o. male Date: 12/11/2022 Primary Care Physican: [name], MD Primary Cardiologist: Diona Browner Electrophysiologist: Mealor Bi-V Pacing: 98%     06/19/2022 Weight: 173-174 lbs 07/27/2022 Weight: 173 lbs 09/05/2022 Weight: 173 lbs 11/07/2022 Office Weight: 167 lbs 7/1/20245 Weight: 167 lbs   AT/AF Burden <1%           Spoke with patient and heart failure questions reviewed.  Transmission results reviewed.  Pt asymptomatic for fluid accumulation.  Reports feeling well at this time and voices no complaints.  He was not able to take PRN Furosemide over the last couple of weeks due to back trouble.         CorVue thoracic impedance suggesting normal fluid levels with exception of possible fluid accumulation from 6/5-6/11 and 6/23-6/30.    Pt has Barostim.   Prescribed:  Furosemide 20 mg Take 1 tablet by mouth daily as needed. Weight gain of 2-3 lbs in a 24 hour period or 5 lbs in one week.   Jardiance 10 mg take 1 tablet before breakfast   Labs: 11/07/2022 Creatinine 1.50, BUN 18, Potassium 4.4, Sodium 144, GFR 45 A complete set of results can be found in Results Review.   Recommendations: He will take PRN Furosemide as needed for fluid.    Follow-up plan: ICM clinic phone appointment on 01/16/2023.   91 day device clinic remote transmission 01/15/2023.     EP/Cardiology Office Visits:  12/13/2022 with Dr. Diona Browner.  Recall 05/06/2023 with [name], PA.     Copy of ICM check sent to Dr. Nelly Laurence.   3 month ICM trend: 12/11/2022.    12-14 Month ICM trend:     [name], RN 12/11/2022 4:09 PM

## 2022-12-12 NOTE — Progress Notes (Deleted)
    Cardiology Office Note  Date: 12/12/2022   ID: Allen [name], DOB 12-09-1933, MRN [id number]  History of Present Illness: Allen [name] is an 89 y.o. male last seen in January.  Saint Jude biventricular ICD in place with follow-up by Dr. Nelly Laurence.  Device check in May revealed normal function.  Recent thoracic impedance showed normal fluid levels with some fluctuations in June.  Barostim Neo device in place with follow-up by Dr. Myra Gianotti.  Physical Exam: VS:  There were no vitals taken for this visit., BMI There is no height or weight on file to calculate BMI.  Wt Readings from Last 3 Encounters:  11/07/22 167 lb 3.2 oz (75.8 kg)  06/29/22 179 lb 12.8 oz (81.6 kg)  04/04/22 169 lb 12.8 oz (77 kg)    General: Patient appears comfortable at rest. HEENT: Conjunctiva and lids normal, oropharynx clear with moist mucosa. Neck: Supple, no elevated JVP or carotid bruits, no thyromegaly. Lungs: Clear to auscultation, nonlabored breathing at rest. Cardiac: Regular rate and rhythm, no S3 or significant systolic murmur, no pericardial rub. Abdomen: Soft, nontender, no hepatomegaly, bowel sounds present, no guarding or rebound. Extremities: No pitting edema, distal pulses 2+. Skin: Warm and dry. Musculoskeletal: No kyphosis. Neuropsychiatric: Alert and oriented x3, affect grossly appropriate.  ECG:  An ECG dated 03/07/2022 was personally reviewed today and demonstrated:  Dual-chamber paced rhythm.  Labwork: 11/07/2022: BUN 18; Creatinine, Ser 1.50; Hemoglobin 17.3; Platelets 178; Potassium 4.4; Sodium 144  March 2024: Cholesterol 143, triglycerides 79, HDL 46, LDL 82  Other Studies Reviewed Today:  Echocardiogram 12/20/2021:  1. Left ventricular ejection fraction, by estimation, is 35 to 40%. The  left ventricle has moderately decreased function. The left ventricle  demonstrates global hypokinesis. There is severe left ventricular  hypertrophy. Left ventricular diastolic  parameters  are consistent with Grade I diastolic dysfunction (impaired  relaxation).   2. Right ventricular systolic function is normal. The right ventricular  size is normal.   3. The mitral valve is abnormal. Mild mitral valve regurgitation. No  evidence of mitral stenosis.   4. The tricuspid valve is abnormal.   5. The aortic valve is tricuspid. There is moderate calcification of the  aortic valve. There is moderate thickening of the aortic valve. Aortic  valve regurgitation is moderate. Mild aortic valve stenosis.   6. Aortic dilatation noted. There is mild dilatation of the ascending  aorta, measuring 39 mm.   7. The inferior vena cava is normal in size with greater than 50%  respiratory variability, suggesting right atrial pressure of 3 mmHg.   Assessment and Plan:  1.  HFrEF with ischemic cardiomyopathy, LVEF 35 to 40% by echocardiogram in July 2023.  2.  Multivessel CAD status post CABG in 2017 with LIMA to LAD, SVG to first diagonal, SVG to RCA, and sequential SVG to first and second obtuse marginal.  3.  St. Jude biventricular ICD in place with follow-up with Dr. Nelly Laurence.  4.  Degenerative calcific aortic stenosis, mild by echocardiogram in July 2023 with mean AV gradient approximately 10 mmHg.  5.  CKD stage IIIb, recent creatinine 1.5.  6.  Mixed hyperlipidemia, LDL 82 in March.  Disposition:  Follow up {follow up:15908}  Signed, Jonelle Sidle, M.D., F.A.C.C. [facility]

## 2022-12-13 ENCOUNTER — Ambulatory Visit: Payer: 59 | Admitting: Cardiology

## 2023-01-03 ENCOUNTER — Telehealth: Payer: Self-pay | Admitting: Cardiology

## 2023-01-03 ENCOUNTER — Other Ambulatory Visit: Payer: Self-pay

## 2023-01-03 MED ORDER — EMPAGLIFLOZIN 10 MG PO TABS: 10.0000 mg | ORAL_TABLET | Freq: Every day | ORAL | 2 refills | Status: DC

## 2023-01-03 MED ORDER — ENTRESTO 49-51 MG PO TABS: 1.0000 | ORAL_TABLET | Freq: Two times a day (BID) | ORAL | 3 refills | Status: DC

## 2023-01-03 MED ORDER — CARVEDILOL 6.25 MG PO TABS: 6.2500 mg | ORAL_TABLET | Freq: Two times a day (BID) | ORAL | 3 refills | Status: DC

## 2023-01-03 NOTE — Telephone Encounter (Signed)
Refilled

## 2023-01-03 NOTE — Telephone Encounter (Signed)
Pt's medication was sent to pt's pharmacy as requested. Confirmation received.  °

## 2023-01-03 NOTE — Telephone Encounter (Signed)
*  STAT* If patient is at the pharmacy, call can be transferred to refill team.   1. Which medications need to be refilled? (please list name of each medication and dose if known) Jardiance and Carvedilol   2. Would you like to learn more about the convenience, safety, & potential cost savings by using the [facility]?      3. Are you open to using the [facility] (Type [facility].    Which pharmacy/location (including street and city if local pharmacy) is medication to be sent to?  [facility] [address], [place]   5. Do they need a 30 day or 90 day supply? 90 days and refills

## 2023-01-15 ENCOUNTER — Ambulatory Visit (INDEPENDENT_AMBULATORY_CARE_PROVIDER_SITE_OTHER): Payer: 59

## 2023-01-15 DIAGNOSIS — I255 Ischemic cardiomyopathy: Secondary | ICD-10-CM

## 2023-01-15 DIAGNOSIS — I5022 Chronic systolic (congestive) heart failure: Secondary | ICD-10-CM

## 2023-01-16 ENCOUNTER — Ambulatory Visit: Payer: 59 | Attending: Cardiovascular Disease

## 2023-01-16 ENCOUNTER — Ambulatory Visit: Payer: 59 | Admitting: Nurse Practitioner

## 2023-01-16 DIAGNOSIS — I5022 Chronic systolic (congestive) heart failure: Secondary | ICD-10-CM | POA: Diagnosis not present

## 2023-01-16 DIAGNOSIS — Z95810 Presence of automatic (implantable) cardiac defibrillator: Secondary | ICD-10-CM | POA: Diagnosis not present

## 2023-01-16 NOTE — Progress Notes (Signed)
EPIC Encounter for ICM Monitoring  Patient Name: Allen [name] is a 89 y.o. male Date: 01/16/2023 Primary Care Physican: [name], MD Primary Cardiologist: Diona Browner Electrophysiologist: Mealor Bi-V Pacing: 98%     06/19/2022 Weight: 173-174 lbs 07/27/2022 Weight: 173 lbs 09/05/2022 Weight: 173 lbs 11/07/2022 Office Weight: 167 lbs 7/1/20245 Weight: 167 lbs 01/16/2023 Weight: 169 lbs   AT/AF Burden <1%           Spoke with patient and heart failure questions reviewed.  Transmission results reviewed.  Pt asymptomatic for fluid accumulation but has gained a couple of pounds        CorVue thoracic impedance suggesting possible fluid accumulation starting 8/3.    Pt has Barostim.   Prescribed:  Furosemide 20 mg Take 1 tablet by mouth daily as needed. Weight gain of 2-3 lbs in a 24 hour period or 5 lbs in one week.   Jardiance 10 mg take 1 tablet before breakfast   Labs: 11/07/2022 Creatinine 1.50, BUN 18, Potassium 4.4, Sodium 144, GFR 45 A complete set of results can be found in Results Review.   Recommendations:   He will take PRN Furosemide x 2 days.  He will call and reschedule appointment that was missed 8/6 with Sharlene Dory in the [place] office.    Follow-up plan: ICM clinic phone appointment on 01/23/2023 to recheck fluid levels.   91 day device clinic remote transmission 04/16/2023.     EP/Cardiology Office Visits:  Recall 05/06/2023 with [name], PA.     Copy of ICM check sent to Dr. Nelly Laurence and Dr Diona Browner as Lorain Childes.   3 month ICM trend: 01/16/2023.    12-14 Month ICM trend:     [name], RN 01/16/2023 1:49 PM

## 2023-01-16 NOTE — Progress Notes (Deleted)
  Cardiology Office Note:  .   Date:  01/16/2023  ID:  [name], DOB 10-27-1933, MRN [id number] PCP: [name], MD  [facility] Providers Cardiologist:  [name], MD Electrophysiologist:  [name], MD { Click to update primary MD,subspecialty MD or APP then REFRESH:1}   History of Present Illness: .   Allen [name] is a 89 y.o. male with a PMH of HFrEF/ICM, s/p St. Jude and Barostim implantation, CAD, s/p CABG in 2017, aortic stenosis with aortic regurgitation, aortic dilatation, T2DM, CKD stage 3b, chronic hypoxic respiratory failure, HTN, arthritis, who presents today for 6 month follow-up.   Last seen by Dr. Diona Browner on June 29, 2022.  Was overall doing well from a cardiac perspective at the time.  Today he presents for scheduled follow-up.  He states  ROS: ***  Studies Reviewed: .        *** Risk Assessment/Calculations:   {Does this patient have ATRIAL FIBRILLATION?[phone]} No BP recorded.  {Refresh Note OR Click here to enter BP  :1}***       Physical Exam:   VS:  There were no vitals taken for this visit.   Wt Readings from Last 3 Encounters:  11/07/22 167 lb 3.2 oz (75.8 kg)  06/29/22 179 lb 12.8 oz (81.6 kg)  04/04/22 169 lb 12.8 oz (77 kg)    GEN: Well nourished, well developed in no acute distress NECK: No JVD; No carotid bruits CARDIAC: ***RRR, no murmurs, rubs, gallops RESPIRATORY:  Clear to auscultation without rales, wheezing or rhonchi  ABDOMEN: Soft, non-tender, non-distended EXTREMITIES:  No edema; No deformity   ASSESSMENT AND PLAN: .   ***    {Are you ordering a CV Procedure (e.g. stress test, cath, DCCV, TEE, etc)?   Press F2        :[id number]}  Dispo: ***  Signed, Sharlene Dory, NP

## 2023-01-23 ENCOUNTER — Ambulatory Visit: Payer: 59 | Attending: Cardiovascular Disease

## 2023-01-23 DIAGNOSIS — I5022 Chronic systolic (congestive) heart failure: Secondary | ICD-10-CM

## 2023-01-23 DIAGNOSIS — Z95810 Presence of automatic (implantable) cardiac defibrillator: Secondary | ICD-10-CM

## 2023-01-24 NOTE — Progress Notes (Signed)
EPIC Encounter for ICM Monitoring  Patient Name: Allen [name] is a 89 y.o. male Date: 01/24/2023 Primary Care Physican: [name], MD Primary Cardiologist: Diona Browner Electrophysiologist: Mealor Bi-V Pacing: 99%     06/19/2022 Weight: 173-174 lbs 07/27/2022 Weight: 173 lbs 09/05/2022 Weight: 173 lbs 11/07/2022 Office Weight: 167 lbs 7/1/20245 Weight: 167 lbs 01/16/2023 Weight: 169 lbs 01/24/2023 Weight: 168 lbs   AT/AF Burden <1%           Spoke with patient and heart failure questions reviewed.  Transmission results reviewed.  Pt asymptomatic for fluid accumulation.        CorVue thoracic impedance suggesting fluid levels returned to normal after taking PRN Furosemide x 2 days    Pt has Barostim.   Prescribed:  Furosemide 20 mg Take 1 tablet by mouth daily as needed. Weight gain of 2-3 lbs in a 24 hour period or 5 lbs in one week.   Jardiance 10 mg take 1 tablet before breakfast   Labs: 11/07/2022 Creatinine 1.50, BUN 18, Potassium 4.4, Sodium 144, GFR 45 A complete set of results can be found in Results Review.   Recommendations:  No changes and encouraged to call if experiencing any fluid symptoms.  Advised to call and reschedule appointment that was missed 8/6 with Sharlene Dory in the [place] office.    Follow-up plan: ICM clinic phone appointment on 02/19/2023.   91 day device clinic remote transmission 04/16/2023.     EP/Cardiology Office Visits:  Recall 05/06/2023 with [name], PA.     Copy of ICM check sent to Dr. Nelly Laurence.   3 month ICM trend: 01/23/2023.    12-14 Month ICM trend:     [name], RN 01/24/2023 12:50 PM

## 2023-01-26 NOTE — Progress Notes (Signed)
Remote ICD transmission.   

## 2023-02-19 ENCOUNTER — Ambulatory Visit: Payer: 59 | Attending: Cardiovascular Disease

## 2023-02-19 DIAGNOSIS — I5022 Chronic systolic (congestive) heart failure: Secondary | ICD-10-CM

## 2023-02-19 DIAGNOSIS — Z95810 Presence of automatic (implantable) cardiac defibrillator: Secondary | ICD-10-CM | POA: Diagnosis not present

## 2023-02-23 ENCOUNTER — Telehealth: Payer: Self-pay | Admitting: Cardiology

## 2023-02-23 MED ORDER — FUROSEMIDE 20 MG PO TABS: 20.0000 mg | ORAL_TABLET | Freq: Every day | ORAL | 2 refills | Status: AC | PRN

## 2023-02-23 NOTE — Telephone Encounter (Signed)
*  STAT* If patient is at the pharmacy, call can be transferred to refill team.   1. Which medications need to be refilled? (please list name of each medication and dose if known) Furosemide    2. Would you like to learn more about the convenience, safety, & potential cost savings by using the [facility]?     3. Are you open to using the [facility] (Type [facility]. .   4. Which pharmacy/location (including street and city if local pharmacy) is medication to be sent to?[facility]  [address], [place]   5. Do they need a 30 day or 90 day supply? 90 days and refills

## 2023-02-23 NOTE — Telephone Encounter (Signed)
Sent to pharmacy 

## 2023-02-23 NOTE — Progress Notes (Signed)
EPIC Encounter for ICM Monitoring  Patient Name: Allen [name] is a 89 y.o. male Date: 02/23/2023 Primary Care Physican: Allen Deiters, MD Primary Cardiologist: Allen [name] Electrophysiologist: Allen [name] Bi-V Pacing: 99%     06/19/2022 Weight: 173-174 lbs 07/27/2022 Weight: 173 lbs 09/05/2022 Weight: 173 lbs 11/07/2022 Office Weight: 167 lbs 7/1/20245 Weight: 167 lbs 01/16/2023 Weight: 169 lbs 01/24/2023 Weight: 168 lbs   AT/AF Burden <1%           Spoke with patient and heart failure questions reviewed.  Transmission results reviewed.  Pt asymptomatic for fluid accumulation.        CorVue thoracic impedance suggesting fluid levels returned to normal after taking PRN Furosemide x 2 days  8/17-8/28  Pt has Barostim.   Prescribed:  Furosemide 20 mg Take 1 tablet by mouth daily as needed. Weight gain of 2-3 lbs in a 24 hour period or 5 lbs in one week.   Jardiance 10 mg take 1 tablet before breakfast   Labs: 11/07/2022 Creatinine 1.50, BUN 18, Potassium 4.4, Sodium 144, GFR 45 A complete set of results can be found in Results Review.   Recommendations:  No changes and encouraged to call if experiencing any fluid symptoms.    Follow-up plan: ICM clinic phone appointment on 03/26/2023.   91 day device clinic remote transmission 04/16/2023.     EP/Cardiology Office Visits:  Recall 05/06/2023 with Allen Saber, PA.     Copy of ICM check sent to Dr. Nelly [name].   3 month ICM trend: 02/19/2023.    12-14 Month ICM trend:     Allen Soda, RN 02/23/2023 12:55 PM

## 2023-03-26 ENCOUNTER — Ambulatory Visit: Payer: 59 | Attending: Cardiovascular Disease

## 2023-03-26 DIAGNOSIS — Z95810 Presence of automatic (implantable) cardiac defibrillator: Secondary | ICD-10-CM | POA: Diagnosis not present

## 2023-03-26 DIAGNOSIS — I5022 Chronic systolic (congestive) heart failure: Secondary | ICD-10-CM

## 2023-03-26 NOTE — Progress Notes (Signed)
EPIC Encounter for ICM Monitoring  Patient Name: Allen [name] is a 89 y.o. male Date: 03/26/2023 Primary Care Physican: [name], MD Primary Cardiologist: Diona Browner Electrophysiologist: Mealor Bi-V Pacing: 99%     06/19/2022 Weight: 173-174 lbs 07/27/2022 Weight: 173 lbs 09/05/2022 Weight: 173 lbs 11/07/2022 Office Weight: 167 lbs 7/1/20245 Weight: 167 lbs 01/16/2023 Weight: 169 lbs 01/24/2023 Weight: 168 lbs 03/26/2023 Weight: 168-169 lbs   AT/AF Burden <1%           Spoke with patient and heart failure questions reviewed.  Transmission results reviewed.  Pt asymptomatic for fluid accumulation.  He is feeling fine.          CorVue thoracic impedance suggesting possible fluid accumulation starting 10/8.    Pt has Barostim.   Prescribed:  Furosemide 20 mg Take 1 tablet by mouth daily as needed. Weight gain of 2-3 lbs in a 24 hour period or 5 lbs in one week.   Jardiance 10 mg take 1 tablet before breakfast   Labs: 11/07/2022 Creatinine 1.50, BUN 18, Potassium 4.4, Sodium 144, GFR 45 A complete set of results can be found in Results Review.   Recommendations:  Advised to take PRN Lasix x 5 days to resolve fluid accumulation and monitor for fluid symptoms.   Follow-up plan: ICM clinic phone appointment on 04/02/2023 to recheck fluid levels.   91 day device clinic remote transmission 04/16/2023.     EP/Cardiology Office Visits:  Recall 05/06/2023 with [name], PA.     Copy of ICM check sent to Dr. Nelly Laurence and Dr Diona Browner for review.   3 month ICM trend: 03/26/2023.    12-14 Month ICM trend:     [name], RN 03/26/2023 2:00 PM

## 2023-04-02 ENCOUNTER — Ambulatory Visit: Payer: 59 | Attending: Cardiovascular Disease

## 2023-04-02 DIAGNOSIS — Z95810 Presence of automatic (implantable) cardiac defibrillator: Secondary | ICD-10-CM

## 2023-04-02 DIAGNOSIS — I5022 Chronic systolic (congestive) heart failure: Secondary | ICD-10-CM

## 2023-04-04 NOTE — Progress Notes (Signed)
EPIC Encounter for ICM Monitoring  Patient Name: Allen [name] is a 89 y.o. male Date: 04/04/2023 Primary Care Physican: [name], MD Primary Cardiologist: Diona Browner Electrophysiologist: Mealor Bi-V Pacing: 99%     06/19/2022 Weight: 173-174 lbs 07/27/2022 Weight: 173 lbs 09/05/2022 Weight: 173 lbs 11/07/2022 Office Weight: 167 lbs 7/1/20245 Weight: 167 lbs 01/16/2023 Weight: 169 lbs 01/24/2023 Weight: 168 lbs 03/26/2023 Weight: 168-169 lbs   AT/AF Burden <1%           Spoke with patient and heart failure questions reviewed.  Transmission results reviewed.  Pt asymptomatic for fluid accumulation.  Reports feeling well at this time and voices no complaints.            CorVue thoracic impedance suggesting fluid levels returned to normal after taking PRN lasix x 5 days.    Pt has Barostim.   Prescribed:  Furosemide 20 mg Take 1 tablet by mouth daily as needed.  Weight gain of 2-3 lbs in a 24 hour period or 5 lbs in one week.   04/04/2023 Reports usually takes Lasix couple times a week. Jardiance 10 mg take 1 tablet before breakfast   Labs: 11/07/2022 Creatinine 1.50, BUN 18, Potassium 4.4, Sodium 144, GFR 45 A complete set of results can be found in Results Review.   Recommendations:  No changes and encouraged to call if experiencing any fluid symptoms.   Follow-up plan: ICM clinic phone appointment on 04/30/2023.   91 day device clinic remote transmission 04/16/2023.     EP/Cardiology Office Visits:  05/07/2023 with [name], PA.     Copy of ICM check sent to Dr. Nelly Laurence   3 month ICM trend: 04/02/2023.    12-14 Month ICM trend:     [name], RN 04/04/2023 8:38 AM

## 2023-04-16 ENCOUNTER — Ambulatory Visit (INDEPENDENT_AMBULATORY_CARE_PROVIDER_SITE_OTHER): Payer: 59

## 2023-04-16 DIAGNOSIS — I255 Ischemic cardiomyopathy: Secondary | ICD-10-CM

## 2023-04-16 DIAGNOSIS — I5022 Chronic systolic (congestive) heart failure: Secondary | ICD-10-CM

## 2023-04-16 LAB — CUP PACEART REMOTE DEVICE CHECK
Battery Remaining Longevity: 29 mo
Battery Remaining Percentage: 33 %
Battery Voltage: 2.92 V
Brady Statistic AP VP Percent: 93 %
Brady Statistic AP VS Percent: 1 %
Brady Statistic AS VP Percent: 5.6 %
Brady Statistic AS VS Percent: 1 %
Brady Statistic RA Percent Paced: 92 %
Date Time Interrogation Session: 20241104010017
HighPow Impedance: 66 Ohm
HighPow Impedance: 66 Ohm
Implantable Lead Connection Status: 753985
Implantable Lead Connection Status: 753985
Implantable Lead Connection Status: 753985
Implantable Lead Implant Date: 20200205
Implantable Lead Implant Date: 20200205
Implantable Lead Implant Date: 20200205
Implantable Lead Location: 753858
Implantable Lead Location: 753859
Implantable Lead Location: 753860
Implantable Pulse Generator Implant Date: 20200205
Lead Channel Impedance Value: 1025 Ohm
Lead Channel Impedance Value: 450 Ohm
Lead Channel Impedance Value: 510 Ohm
Lead Channel Pacing Threshold Amplitude: 0.625 V
Lead Channel Pacing Threshold Amplitude: 1 V
Lead Channel Pacing Threshold Amplitude: 1.25 V
Lead Channel Pacing Threshold Pulse Width: 0.5 ms
Lead Channel Pacing Threshold Pulse Width: 0.5 ms
Lead Channel Pacing Threshold Pulse Width: 0.5 ms
Lead Channel Sensing Intrinsic Amplitude: 11.7 mV
Lead Channel Sensing Intrinsic Amplitude: 3.5 mV
Lead Channel Setting Pacing Amplitude: 2 V
Lead Channel Setting Pacing Amplitude: 2 V
Lead Channel Setting Pacing Amplitude: 2.5 V
Lead Channel Setting Pacing Pulse Width: 0.5 ms
Lead Channel Setting Pacing Pulse Width: 0.5 ms
Lead Channel Setting Sensing Sensitivity: 0.5 mV
Pulse Gen Serial Number: 9880571

## 2023-05-01 ENCOUNTER — Ambulatory Visit: Payer: 59 | Attending: Cardiovascular Disease

## 2023-05-01 DIAGNOSIS — I5022 Chronic systolic (congestive) heart failure: Secondary | ICD-10-CM

## 2023-05-01 DIAGNOSIS — Z95810 Presence of automatic (implantable) cardiac defibrillator: Secondary | ICD-10-CM | POA: Diagnosis not present

## 2023-05-01 NOTE — Progress Notes (Signed)
EPIC Encounter for ICM Monitoring  Patient Name: Allen [name] is a 89 y.o. male Date: 05/01/2023 Primary Care Physican: [name], MD Primary Cardiologist: Diona Browner Electrophysiologist: Mealor Bi-V Pacing: 99%     06/19/2022 Weight: 173-174 lbs 07/27/2022 Weight: 173 lbs 09/05/2022 Weight: 173 lbs 11/07/2022 Office Weight: 167 lbs 7/1/20245 Weight: 167 lbs 01/16/2023 Weight: 169 lbs 01/24/2023 Weight: 168 lbs 03/26/2023 Weight: 168-169 lbs   AT/AF Burden <1%           Attempted call to patient and unable to reach.   Transmission reviewed.           CorVue thoracic impedance suggesting possible fluid accumulation starting 11/16.    Pt has Barostim.   Prescribed:  Furosemide 20 mg Take 1 tablet by mouth daily as needed. Weight gain of 2-3 lbs in a 24 hour period or 5 lbs in one week.   Jardiance 10 mg take 1 tablet before breakfast   Labs: 11/07/2022 Creatinine 1.50, BUN 18, Potassium 4.4, Sodium 144, GFR 45 A complete set of results can be found in Results Review.   Recommendations:  Unable to reach.   Will advise to take PRN Lasix x 1 day if patient is reached.    Follow-up plan: ICM clinic phone appointment on 06/04/2023 (will be seen in the office 11/25).   91 day device clinic remote transmission 07/16/2023.     EP/Cardiology Office Visits:  05/07/2023 with [name], PA.     Copy of ICM check sent to Dr. Nelly Laurence.   3 month ICM trend: 05/01/2023.    12-14 Month ICM trend:     [name], RN 05/01/2023 10:32 AM

## 2023-05-04 NOTE — Progress Notes (Unsigned)
  Cardiology Office Note:   Date:  05/07/2023  ID:  Allen [name], DOB 1933-09-22, MRN [id number]  Primary Cardiologist: [name], MD Electrophysiologist: [name], MD   History of Present Illness:   Allen [name] is a 89 y.o. male with h/o chronic systolic CHF, HTN, vertiginous, chronic hypoxic respiratory failure, and C-spine arthritis seen today for routine electrophysiology followup.   Since last being seen in our clinic the patient reports doing about the same. Not very active. Uses portable O2 and a large tank while at home. SOB with minimal activity. Denies edema. Frustrated at the "size" of his barostim and ICD generators. Stable pockets on exam. No new complaints otherwise. No chest pain or syncope.  Review of systems complete and found to be negative unless listed in HPI.    EP Information / Studies Reviewed:    EKG is ordered today. Personal review as below.  Device History: Barostim (standard) implanted 05/27/2021 for Chronic systolic CHF St. Jude BiV ICD implanted 07/17/2018 for ICM, NYHA III, SSS, LBBB  Barostim Interrogation- Performed personally and reviewed in detail today,  See scanned report  ICD/PPM interrogation - Performed personally and reviewed in detail today.    Physical Exam:   VS:  BP 118/86 (BP Location: Left Arm, Patient Position: Sitting, Cuff Size: Normal)   Pulse 65   Ht 5\' 2"  (1.575 m)   Wt 167 lb 12.8 oz (76.1 kg)   SpO2 94%   BMI 30.69 kg/m    Wt Readings from Last 3 Encounters:  05/07/23 167 lb 12.8 oz (76.1 kg)  11/07/22 167 lb 3.2 oz (75.8 kg)  06/29/22 179 lb 12.8 oz (81.6 kg)     GEN: Well nourished, well developed in no acute distress NECK: No JVD; No carotid bruits CARDIAC: Regular rate and rhythm, no murmurs, rubs, gallops RESPIRATORY:  Diminished throughout.  ABDOMEN: Soft, non-tender, non-distended EXTREMITIES:  No edema; No deformity   ASSESSMENT AND PLAN:    Chronic systolic CHF s/p St. Jude and Barostim  implantation NYHA III symptoms confounded by COPD and O2 dependence.  Device programmed at 7.6 ma chronically.  Device impedence stable, Battery to 09/2025 Normal device function See scanned report. S/p AV optimization 10/27/2019. He felt somewhat better and has maintained this. V-V optimization 02/2020. QRS 176 > 122 ms after reprogramming with positive/RBBB V1 and biphasic lead 1. Previous optimization imaging was reviewed and felt appropriate to increase PAV delay at this time, with intrinsic A to R of 300 ms.  HTN Stable on current regimen   Vertiginous dizziness PCP follows  Chronic hypoxic respiratory failure Encouraged consistent O2 use Suspect this is the primary contributor to his dyspnea.   C-spine arthritis Very limiting, not felt to be related to BAT (pre-dates)  Disposition:   Follow up with EP APP in in 6 months  Signed, Graciella Freer, PA-C

## 2023-05-07 ENCOUNTER — Ambulatory Visit: Payer: 59 | Attending: Student | Admitting: Student

## 2023-05-07 ENCOUNTER — Encounter: Payer: Self-pay | Admitting: Student

## 2023-05-07 VITALS — BP 118/86 | HR 65 | Ht 62.0 in | Wt 167.8 lb

## 2023-05-07 DIAGNOSIS — Z95810 Presence of automatic (implantable) cardiac defibrillator: Secondary | ICD-10-CM | POA: Diagnosis not present

## 2023-05-07 DIAGNOSIS — I255 Ischemic cardiomyopathy: Secondary | ICD-10-CM

## 2023-05-07 DIAGNOSIS — I447 Left bundle-branch block, unspecified: Secondary | ICD-10-CM | POA: Diagnosis not present

## 2023-05-07 DIAGNOSIS — I5022 Chronic systolic (congestive) heart failure: Secondary | ICD-10-CM

## 2023-05-07 DIAGNOSIS — I25119 Atherosclerotic heart disease of native coronary artery with unspecified angina pectoris: Secondary | ICD-10-CM

## 2023-05-07 LAB — CUP PACEART INCLINIC DEVICE CHECK
Battery Remaining Longevity: 27 mo
Brady Statistic RA Percent Paced: 92 %
Brady Statistic RV Percent Paced: 99 %
Date Time Interrogation Session: 20241125110830
HighPow Impedance: 64.125
Implantable Lead Connection Status: 753985
Implantable Lead Connection Status: 753985
Implantable Lead Connection Status: 753985
Implantable Lead Implant Date: 20200205
Implantable Lead Implant Date: 20200205
Implantable Lead Implant Date: 20200205
Implantable Lead Location: 753858
Implantable Lead Location: 753859
Implantable Lead Location: 753860
Implantable Pulse Generator Implant Date: 20200205
Lead Channel Impedance Value: 1050 Ohm
Lead Channel Impedance Value: 475 Ohm
Lead Channel Impedance Value: 550 Ohm
Lead Channel Pacing Threshold Amplitude: 0.625 V
Lead Channel Pacing Threshold Amplitude: 1.25 V
Lead Channel Pacing Threshold Amplitude: 1.25 V
Lead Channel Pacing Threshold Amplitude: 1.25 V
Lead Channel Pacing Threshold Amplitude: 1.25 V
Lead Channel Pacing Threshold Pulse Width: 0.5 ms
Lead Channel Pacing Threshold Pulse Width: 0.5 ms
Lead Channel Pacing Threshold Pulse Width: 0.5 ms
Lead Channel Pacing Threshold Pulse Width: 0.5 ms
Lead Channel Pacing Threshold Pulse Width: 0.5 ms
Lead Channel Sensing Intrinsic Amplitude: 12 mV
Lead Channel Sensing Intrinsic Amplitude: 3.9 mV
Lead Channel Setting Pacing Amplitude: 2 V
Lead Channel Setting Pacing Amplitude: 2.5 V
Lead Channel Setting Pacing Amplitude: 2.5 V
Lead Channel Setting Pacing Pulse Width: 0.5 ms
Lead Channel Setting Pacing Pulse Width: 0.5 ms
Lead Channel Setting Sensing Sensitivity: 0.5 mV
Pulse Gen Serial Number: 9880571

## 2023-05-07 NOTE — Patient Instructions (Signed)
Medication Instructions:  Your physician recommends that you continue on your current medications as directed. Please refer to the Current Medication list given to you today.  *If you need a refill on your cardiac medications before your next appointment, please call your pharmacy*  Lab Work: None ordered If you have labs (blood work) drawn today and your tests are completely normal, you will receive your results only by: MyChart Message (if you have MyChart) OR A paper copy in the mail If you have any lab test that is abnormal or we need to change your treatment, we will call you to review the results  Follow-Up: At [facility], you and your health needs are our priority.  As part of our continuing mission to provide you with exceptional heart care, we have created designated Provider Care Teams.  These Care Teams include your primary Cardiologist (physician) and Advanced Practice Providers (APPs -  Physician Assistants and Nurse Practitioners) who all work together to provide you with the care you need, when you need it.  Your next appointment:   6 month(s)  Provider:   Michael "Andy" Tillery, PA-C  

## 2023-05-07 NOTE — Progress Notes (Signed)
Remote ICD transmission.   

## 2023-06-04 ENCOUNTER — Ambulatory Visit: Payer: 59 | Attending: Cardiovascular Disease

## 2023-06-04 DIAGNOSIS — Z95810 Presence of automatic (implantable) cardiac defibrillator: Secondary | ICD-10-CM

## 2023-06-04 DIAGNOSIS — I5022 Chronic systolic (congestive) heart failure: Secondary | ICD-10-CM | POA: Diagnosis not present

## 2023-06-04 NOTE — Progress Notes (Signed)
EPIC Encounter for ICM Monitoring  Patient Name: Allen [name] is a 89 y.o. male Date: 06/04/2023 Primary Care Physican: [name], MD Primary Cardiologist: Diona Browner Electrophysiologist: Mealor Bi-V Pacing: 99%     06/19/2022 Weight: 173-174 lbs 07/27/2022 Weight: 173 lbs 09/05/2022 Weight: 173 lbs 11/07/2022 Office Weight: 167 lbs 7/1/20245 Weight: 167 lbs 01/16/2023 Weight: 169 lbs 01/24/2023 Weight: 168 lbs 03/26/2023 Weight: 168-169 lbs   AT/AF Burden <1%           Spoke with patient and heart failure questions reviewed.  Transmission results reviewed.  Pt asymptomatic for fluid accumulation.  Reports feeling well at this time and voices no complaints.           CorVue thoracic impedance suggesting possible fluid accumulation starting 11/16.    Pt has Barostim.   Prescribed:  Furosemide 20 mg Take 1 tablet by mouth daily as needed. Weight gain of 2-3 lbs in a 24 hour period or 5 lbs in one week.   Jardiance 10 mg take 1 tablet before breakfast   Labs: 11/07/2022 Creatinine 1.50, BUN 18, Potassium 4.4, Sodium 144, GFR 45 A complete set of results can be found in Results Review.   Recommendations:  Pt will take PRN Lasix twice this week and then back to PRN.   Follow-up plan: ICM clinic phone appointment on 06/11/2023 to recheck fluid levels.   91 day device clinic remote transmission 07/16/2023.     EP/Cardiology Office Visits:  Recall 11/04/2023 with [name], PA.     Copy of ICM check sent to Dr. Nelly Laurence.   3 month ICM trend: 06/04/2023.    12-14 Month ICM trend:     [name], RN 06/04/2023 4:21 PM

## 2023-06-11 ENCOUNTER — Ambulatory Visit: Payer: 59 | Attending: Cardiovascular Disease

## 2023-06-11 DIAGNOSIS — Z95810 Presence of automatic (implantable) cardiac defibrillator: Secondary | ICD-10-CM

## 2023-06-11 DIAGNOSIS — I5022 Chronic systolic (congestive) heart failure: Secondary | ICD-10-CM

## 2023-07-01 ENCOUNTER — Other Ambulatory Visit: Payer: Self-pay | Admitting: Cardiology

## 2023-07-02 NOTE — Telephone Encounter (Signed)
This is a Educational psychologist pt. Dr. Diona Browner sees this pt. Please address

## 2023-07-09 ENCOUNTER — Ambulatory Visit: Payer: 59 | Attending: Cardiovascular Disease

## 2023-07-09 DIAGNOSIS — I5022 Chronic systolic (congestive) heart failure: Secondary | ICD-10-CM | POA: Diagnosis not present

## 2023-07-09 DIAGNOSIS — Z95810 Presence of automatic (implantable) cardiac defibrillator: Secondary | ICD-10-CM

## 2023-07-11 NOTE — Progress Notes (Signed)
EPIC Encounter for ICM Monitoring  Patient Name: Allen [name] is a 89 y.o. male Date: 07/11/2023 Primary Care Physican: [name], MD Primary Cardiologist: Diona Browner Electrophysiologist: Mealor Bi-V Pacing: >99%     7/1/20245 Weight: 167 lbs 01/16/2023 Weight: 169 lbs 01/24/2023 Weight: 168 lbs 03/26/2023 Weight: 168-169 lbs 06/14/2023 Weight: 168 lbs   AT/AF Burden <1%           Spoke with patient and heart failure questions reviewed.  Transmission results reviewed.  Pt asymptomatic for fluid accumulation.  Reports feeling well at this time and voices no complaints.             CorVue thoracic impedance suggesting possible fluid accumulation starting 1/16-1/20.    Pt has Barostim.   Prescribed:  Furosemide 20 mg Take 1 tablet by mouth daily as needed. Weight gain of 2-3 lbs in a 24 hour period or 5 lbs in one week.  Takes every other day except for Saturday and Sunday Jardiance 10 mg take 1 tablet before breakfast   Labs: 11/07/2022 Creatinine 1.50, BUN 18, Potassium 4.4, Sodium 144, GFR 45 A complete set of results can be found in Results Review.   Recommendations:  No changes and encouraged to call if experiencing any fluid symptoms.   Follow-up plan: ICM clinic phone appointment on 08/13/2023.   91 day device clinic remote transmission 07/16/2023.     EP/Cardiology Office Visits:  Recall 11/04/2023 with [name], PA.     Copy of ICM check sent to Dr. Nelly Laurence.   3 month ICM trend: 07/09/2023.    12-14 Month ICM trend:     [name], RN 07/11/2023 1:45 PM

## 2023-07-16 ENCOUNTER — Ambulatory Visit (INDEPENDENT_AMBULATORY_CARE_PROVIDER_SITE_OTHER): Payer: Medicare PPO

## 2023-07-16 DIAGNOSIS — I255 Ischemic cardiomyopathy: Secondary | ICD-10-CM

## 2023-07-16 DIAGNOSIS — I5022 Chronic systolic (congestive) heart failure: Secondary | ICD-10-CM | POA: Diagnosis not present

## 2023-07-16 LAB — CUP PACEART REMOTE DEVICE CHECK
Battery Remaining Longevity: 25 mo
Battery Remaining Percentage: 31 %
Battery Voltage: 2.9 V
Brady Statistic AP VP Percent: 96 %
Brady Statistic AP VS Percent: 1 %
Brady Statistic AS VP Percent: 3.7 %
Brady Statistic AS VS Percent: 1 %
Brady Statistic RA Percent Paced: 95 %
Date Time Interrogation Session: 20250203020017
HighPow Impedance: 68 Ohm
HighPow Impedance: 68 Ohm
Implantable Lead Connection Status: 753985
Implantable Lead Connection Status: 753985
Implantable Lead Connection Status: 753985
Implantable Lead Implant Date: 20200205
Implantable Lead Implant Date: 20200205
Implantable Lead Implant Date: 20200205
Implantable Lead Location: 753858
Implantable Lead Location: 753859
Implantable Lead Location: 753860
Implantable Pulse Generator Implant Date: 20200205
Lead Channel Impedance Value: 1050 Ohm
Lead Channel Impedance Value: 480 Ohm
Lead Channel Impedance Value: 530 Ohm
Lead Channel Pacing Threshold Amplitude: 0.625 V
Lead Channel Pacing Threshold Amplitude: 1.25 V
Lead Channel Pacing Threshold Amplitude: 1.25 V
Lead Channel Pacing Threshold Pulse Width: 0.5 ms
Lead Channel Pacing Threshold Pulse Width: 0.5 ms
Lead Channel Pacing Threshold Pulse Width: 0.5 ms
Lead Channel Sensing Intrinsic Amplitude: 12 mV
Lead Channel Sensing Intrinsic Amplitude: 3.3 mV
Lead Channel Setting Pacing Amplitude: 2 V
Lead Channel Setting Pacing Amplitude: 2.5 V
Lead Channel Setting Pacing Amplitude: 2.5 V
Lead Channel Setting Pacing Pulse Width: 0.5 ms
Lead Channel Setting Pacing Pulse Width: 0.5 ms
Lead Channel Setting Sensing Sensitivity: 0.5 mV
Pulse Gen Serial Number: 9880571

## 2023-07-25 ENCOUNTER — Encounter: Payer: Self-pay | Admitting: Cardiovascular Disease

## 2023-08-13 ENCOUNTER — Ambulatory Visit: Payer: 59 | Attending: Cardiovascular Disease

## 2023-08-13 DIAGNOSIS — I5022 Chronic systolic (congestive) heart failure: Secondary | ICD-10-CM

## 2023-08-13 DIAGNOSIS — Z95810 Presence of automatic (implantable) cardiac defibrillator: Secondary | ICD-10-CM

## 2023-08-15 NOTE — Progress Notes (Signed)
 EPIC Encounter for ICM Monitoring  Patient Name: Allen [name] is a 89 y.o. male Date: 08/15/2023 Primary Care Physican: [name], MD Primary Cardiologist: Diona Browner Electrophysiologist: Mealor Bi-V Pacing: >99%     7/1/20245 Weight: 167 lbs 01/16/2023 Weight: 169 lbs 01/24/2023 Weight: 168 lbs 03/26/2023 Weight: 168-169 lbs 06/14/2023 Weight: 168 lbs 08/15/2023 Weight: 168 lbs   AT/AF Burden <1%           Spoke with patient and heart failure questions reviewed.  Transmission results reviewed.  Pt asymptomatic for fluid accumulation.  Reports feeling well at this time and voices no complaints.             CorVue thoracic impedance suggesting normal fluid levels with the exception of possible fluid accumulation from 2/15-2/20.    Pt has Barostim.   Prescribed:  Furosemide 20 mg Take 1 tablet by mouth daily as needed. Weight gain of 2-3 lbs in a 24 hour period or 5 lbs in one week.  Takes every other day except for Saturday and Sunday Jardiance 10 mg take 1 tablet before breakfast   Labs: 11/07/2022 Creatinine 1.50, BUN 18, Potassium 4.4, Sodium 144, GFR 45 A complete set of results can be found in Results Review.   Recommendations:  No changes and encouraged to call if experiencing any fluid symptoms.   Follow-up plan: ICM clinic phone appointment on 09/17/2023.   91 day device clinic remote transmission 10/15/2023.     EP/Cardiology Office Visits:  Recall 11/04/2023 with [name], PA.     Copy of ICM check sent to Dr. Nelly Laurence.    3 month ICM trend: 08/13/2023.    12-14 Month ICM trend:     [name], RN 08/15/2023 4:31 PM

## 2023-08-21 NOTE — Progress Notes (Signed)
 Remote ICD transmission.

## 2023-09-17 ENCOUNTER — Ambulatory Visit: Attending: Cardiovascular Disease

## 2023-09-17 DIAGNOSIS — Z95810 Presence of automatic (implantable) cardiac defibrillator: Secondary | ICD-10-CM

## 2023-09-17 DIAGNOSIS — I5022 Chronic systolic (congestive) heart failure: Secondary | ICD-10-CM | POA: Diagnosis not present

## 2023-09-18 NOTE — Progress Notes (Signed)
 EPIC Encounter for ICM Monitoring  Patient Name: Allen [name] is a 89 y.o. male Date: 09/18/2023 Primary Care Physican: [name], MD Primary Cardiologist: Diona Browner Electrophysiologist: Mealor Bi-V Pacing: >99%     7/1/20245 Weight: 167 lbs 01/16/2023 Weight: 169 lbs 01/24/2023 Weight: 168 lbs 03/26/2023 Weight: 168-169 lbs 06/14/2023 Weight: 168 lbs 08/15/2023 Weight: 168 lbs   AT/AF Burden <1%           Spoke with patient and heart failure questions reviewed.  Transmission results reviewed.  Pt asymptomatic for fluid accumulation.  He ate chicken over the weekend that was really salty.            CorVue thoracic impedance suggesting possible fluid accumulation starting 4/5.    Pt has Barostim.   Prescribed:  Furosemide 20 mg Take 1 tablet by mouth daily as needed. Weight gain of 2-3 lbs in a 24 hour period or 5 lbs in one week.  Takes every other day except for Saturday and Sunday Jardiance 10 mg take 1 tablet before breakfast   Labs: 11/07/2022 Creatinine 1.50, BUN 18, Potassium 4.4, Sodium 144, GFR 45 A complete set of results can be found in Results Review.   Recommendations:  Advised to limit salt intake. Will recheck fluid levels next week.    Follow-up plan: ICM clinic phone appointment on 09/26/2023 to recheck fluid levels.   91 day device clinic remote transmission 10/15/2023.     EP/Cardiology Office Visits:  Recall 11/04/2023 with [name], PA.     Copy of ICM check sent to Dr. Nelly Laurence.     3 month ICM trend: 09/17/2023.    12-14 Month ICM trend:     [name], RN 09/18/2023 8:09 AM

## 2023-09-26 ENCOUNTER — Ambulatory Visit (INDEPENDENT_AMBULATORY_CARE_PROVIDER_SITE_OTHER)

## 2023-09-26 DIAGNOSIS — I5022 Chronic systolic (congestive) heart failure: Secondary | ICD-10-CM

## 2023-09-26 DIAGNOSIS — Z95810 Presence of automatic (implantable) cardiac defibrillator: Secondary | ICD-10-CM

## 2023-09-26 NOTE — Progress Notes (Signed)
 EPIC Encounter for ICM Monitoring  Patient Name: Allen [name] is a 89 y.o. male Date: 09/26/2023 Primary Care Physican: Veda Gerald, MD Primary Cardiologist: Londa Rival Electrophysiologist: Mealor Bi-V Pacing: 99%     7/1/20245 Weight: 167 lbs 01/16/2023 Weight: 169 lbs 01/24/2023 Weight: 168 lbs 03/26/2023 Weight: 168-169 lbs 06/14/2023 Weight: 168 lbs 08/15/2023 Weight: 168 lbs   AT/AF Burden <1%           Spoke with patient and currently in ER due to stomach upset and generally not feeling well.              CorVue thoracic impedance suggesting possible fluid accumulation starting 4/12 and returned close to baseline.    Pt has Barostim.   Prescribed:  Furosemide 20 mg Take 1 tablet by mouth daily as needed. Weight gain of 2-3 lbs in a 24 hour period or 5 lbs in one week.  Takes every other day except for Saturday and Sunday. Jardiance 10 mg take 1 tablet before breakfast   Labs: 11/07/2022 Creatinine 1.50, BUN 18, Potassium 4.4, Sodium 144, GFR 45 A complete set of results can be found in Results Review.   Recommendations:  Advised to limit salt intake. Will recheck fluid levels next week.    Follow-up plan: ICM clinic phone appointment on 10/03/2023 to recheck fluid levels.  91 day device clinic remote transmission 10/15/2023.     EP/Cardiology Office Visits:  Recall 11/04/2023 with Michaelle Adolphus, PA.     Copy of ICM check sent to Dr. Arlester Ladd.      3 month ICM trend: 09/26/2023.    12-14 Month ICM trend:     Almyra Jain, RN 09/26/2023 5:02 PM

## 2023-10-03 ENCOUNTER — Ambulatory Visit: Attending: Cardiovascular Disease

## 2023-10-03 ENCOUNTER — Other Ambulatory Visit: Payer: Self-pay | Admitting: Cardiology

## 2023-10-03 DIAGNOSIS — Z95810 Presence of automatic (implantable) cardiac defibrillator: Secondary | ICD-10-CM

## 2023-10-03 DIAGNOSIS — I5022 Chronic systolic (congestive) heart failure: Secondary | ICD-10-CM

## 2023-10-03 NOTE — Progress Notes (Signed)
 EPIC Encounter for ICM Monitoring  Patient Name: Allen [name] is a 89 y.o. male Date: 10/03/2023 Primary Care Physican: Veda Gerald, MD Primary Cardiologist: Londa Rival Electrophysiologist: Mealor Bi-V Pacing: 99%     7/1/20245 Weight: 167 lbs 01/16/2023 Weight: 169 lbs 01/24/2023 Weight: 168 lbs 03/26/2023 Weight: 168-169 lbs 06/14/2023 Weight: 168 lbs 08/15/2023 Weight: 168 lbs   AT/AF Burden <1%           Spoke with patient and heart failure questions reviewed.  Transmission results reviewed.  Pt asymptomatic for fluid accumulation.  He is feeling better since ED Visit and back at baseline.        CorVue thoracic impedance suggesting possible fluid accumulation starting 4/12 and returned close to baseline.    Pt has Barostim.   Prescribed:  Furosemide  20 mg Take 1 tablet by mouth daily as needed. Weight gain of 2-3 lbs in a 24 hour period or 5 lbs in one week.  Takes every day except for Saturday and Sunday. Jardiance  10 mg take 1 tablet before breakfast   Labs: 09/26/2023 Creatinine 1.75, BUN 23, Potassium 4.7, Sodium 143, GFR 37 A complete set of results can be found in Results Review.   Recommendations: No changes and encouraged to call if experiencing any fluid symptoms.   Follow-up plan: ICM clinic phone appointment on 10/22/2023.  91 day device clinic remote transmission 10/15/2023.     EP/Cardiology Office Visits:  11/06/2023 with Michaelle Adolphus, PA.     Copy of ICM check sent to Dr. Arlester Ladd.     3 month ICM trend: 10/03/2023.    12-14 Month ICM trend:     Almyra Jain, RN 10/03/2023 1:03 PM

## 2023-10-15 ENCOUNTER — Ambulatory Visit (INDEPENDENT_AMBULATORY_CARE_PROVIDER_SITE_OTHER): Payer: Medicare PPO

## 2023-10-15 DIAGNOSIS — I5022 Chronic systolic (congestive) heart failure: Secondary | ICD-10-CM

## 2023-10-15 DIAGNOSIS — I255 Ischemic cardiomyopathy: Secondary | ICD-10-CM

## 2023-10-16 LAB — CUP PACEART REMOTE DEVICE CHECK
Battery Remaining Longevity: 23 mo
Battery Remaining Percentage: 27 %
Battery Voltage: 2.9 V
Brady Statistic AP VP Percent: 95 %
Brady Statistic AP VS Percent: 1 %
Brady Statistic AS VP Percent: 3.7 %
Brady Statistic AS VS Percent: 1 %
Brady Statistic RA Percent Paced: 94 %
Date Time Interrogation Session: 20250505020016
HighPow Impedance: 75 Ohm
HighPow Impedance: 75 Ohm
Implantable Lead Connection Status: 753985
Implantable Lead Connection Status: 753985
Implantable Lead Connection Status: 753985
Implantable Lead Implant Date: 20200205
Implantable Lead Implant Date: 20200205
Implantable Lead Implant Date: 20200205
Implantable Lead Location: 753858
Implantable Lead Location: 753859
Implantable Lead Location: 753860
Implantable Pulse Generator Implant Date: 20200205
Lead Channel Impedance Value: 1100 Ohm
Lead Channel Impedance Value: 460 Ohm
Lead Channel Impedance Value: 510 Ohm
Lead Channel Pacing Threshold Amplitude: 0.625 V
Lead Channel Pacing Threshold Amplitude: 1.25 V
Lead Channel Pacing Threshold Amplitude: 1.25 V
Lead Channel Pacing Threshold Pulse Width: 0.5 ms
Lead Channel Pacing Threshold Pulse Width: 0.5 ms
Lead Channel Pacing Threshold Pulse Width: 0.5 ms
Lead Channel Sensing Intrinsic Amplitude: 12 mV
Lead Channel Sensing Intrinsic Amplitude: 3.2 mV
Lead Channel Setting Pacing Amplitude: 2 V
Lead Channel Setting Pacing Amplitude: 2.5 V
Lead Channel Setting Pacing Amplitude: 2.5 V
Lead Channel Setting Pacing Pulse Width: 0.5 ms
Lead Channel Setting Pacing Pulse Width: 0.5 ms
Lead Channel Setting Sensing Sensitivity: 0.5 mV
Pulse Gen Serial Number: 9880571

## 2023-10-22 ENCOUNTER — Ambulatory Visit: Attending: Cardiovascular Disease

## 2023-10-22 DIAGNOSIS — Z95810 Presence of automatic (implantable) cardiac defibrillator: Secondary | ICD-10-CM

## 2023-10-22 DIAGNOSIS — I5022 Chronic systolic (congestive) heart failure: Secondary | ICD-10-CM

## 2023-10-23 NOTE — Progress Notes (Signed)
 EPIC Encounter for ICM Monitoring  Patient Name: Allen [name] is a 89 y.o. male Date: 10/23/2023 Primary Care Physican: Veda Gerald, MD Primary Cardiologist: Londa Rival Electrophysiologist: Mealor Bi-V Pacing: 99%     7/1/20245 Weight: 167 lbs 01/16/2023 Weight: 169 lbs 01/24/2023 Weight: 168 lbs 03/26/2023 Weight: 168-169 lbs 06/14/2023 Weight: 168 lbs 08/15/2023 Weight: 168 lbs 10/23/2023 Weight: 169 lbs   AT/AF Burden <1%           Spoke with patient and heart failure questions reviewed.  Transmission results reviewed.  Pt asymptomatic for fluid accumulation.  He is feeling okay at this time.        CorVue thoracic impedance suggesting possible fluid accumulation starting 5/9.    Pt has Barostim.   Prescribed:  Furosemide  20 mg Take 1 tablet by mouth daily as needed. Weight gain of 2-3 lbs in a 24 hour period or 5 lbs in one week.  Takes every day except for Saturday and Sunday. Jardiance  10 mg take 1 tablet before breakfast   Labs: 09/26/2023 Creatinine 1.75, BUN 23, Potassium 4.7, Sodium 143, GFR 37 A complete set of results can be found in Results Review.   Recommendations:   Advised to take 1 extra Lasix  tomorrow only then return to 1 a day.   Follow-up plan: ICM clinic phone appointment on 10/31/2023 to recheck fluid levels.  91 day device clinic remote transmission 01/14/2024.     EP/Cardiology Office Visits:  11/06/2023 with Michaelle Adolphus, PA.     Copy of ICM check sent to Dr. Arlester Ladd.     3 month ICM trend: 10/22/2023.    12-14 Month ICM trend:     Almyra Jain, RN 10/23/2023 3:11 PM

## 2023-10-26 ENCOUNTER — Ambulatory Visit: Payer: Self-pay | Admitting: Cardiovascular Disease

## 2023-10-31 ENCOUNTER — Ambulatory Visit: Attending: Cardiovascular Disease

## 2023-10-31 DIAGNOSIS — Z95810 Presence of automatic (implantable) cardiac defibrillator: Secondary | ICD-10-CM

## 2023-10-31 DIAGNOSIS — I5022 Chronic systolic (congestive) heart failure: Secondary | ICD-10-CM

## 2023-10-31 NOTE — Progress Notes (Signed)
 EPIC Encounter for ICM Monitoring  Patient Name: Allen Allen is a 89 y.o. male Date: 10/31/2023 Primary Care Physican: Veda Gerald, MD Primary Cardiologist: Londa Rival Electrophysiologist: Mealor Bi-V Pacing: 99%     7/1/20245 Weight: 167 lbs 01/16/2023 Weight: 169 lbs 01/24/2023 Weight: 168 lbs 03/26/2023 Weight: 168-169 lbs 06/14/2023 Weight: 168 lbs 08/15/2023 Weight: 168 lbs 10/23/2023 Weight: 169 lbs   AT/AF Burden <1%           Spoke with patient and heart failure questions reviewed.  Transmission results reviewed.  Pt asymptomatic for fluid accumulation.  Reports feeling well at this time and voices no complaints.          CorVue thoracic impedance suggesting possible fluid accumulation starting 5/9 and returned to baseline 5/13.    Pt has Barostim.   Prescribed:  Furosemide  20 mg Take 1 tablet by mouth daily as needed. Weight gain of 2-3 lbs in a 24 hour period or 5 lbs in one week.  Takes every day except for Saturday and Sunday. Jardiance  10 mg take 1 tablet before breakfast   Labs: 09/26/2023 Creatinine 1.75, BUN 23, Potassium 4.7, Sodium 143, GFR 37 A complete set of results can be found in Results Review.   Recommendations:   No changes and encouraged to call if experiencing any fluid symptoms.   Follow-up plan: ICM clinic phone appointment on 11/26/2023.  91 day device clinic remote transmission 01/14/2024.     EP/Cardiology Office Visits:  11/06/2023 with Michaelle Adolphus, PA.     Copy of ICM check sent to Dr. Arlester Ladd.     3 month ICM trend: 10/31/2023.    12-14 Month ICM trend:     Almyra Jain, RN 10/31/2023 7:19 AM

## 2023-11-05 NOTE — Progress Notes (Unsigned)
  Electrophysiology Office Note:   ID:  Allen [name], DOB [birth year], MRN [id number]  Primary Cardiologist: Teddie Favre, MD Electrophysiologist: Efraim Grange, MD  {Click to update primary MD,subspecialty MD or APP then REFRESH:1}    History of Present Illness:   Allen [name] is a 89 y.o. male with h/o chronic systolic CHF, HTN, vertiginous, chronic hypoxic respiratory failure, and C-spine arthritis seen today for routine electrophysiology followup.   Since last being seen in our clinic the patient reports doing ***.  he denies chest pain, palpitations, dyspnea, PND, orthopnea, nausea, vomiting, dizziness, syncope, edema, weight gain, or early satiety.   Review of systems complete and found to be negative unless listed in HPI.   EP Information / Studies Reviewed:    EKG is not ordered today. EKG from 04/2023 reviewed which showed ***       ICD Interrogation-  reviewed in detail today,  See PACEART report.  Arrhythmia/Device History Barostim (standard) implanted 05/27/2021 for Chronic systolic CHF St. Jude BiV ICD implanted 07/17/2018 for ICM, NYHA III, SSS, LBBB   Barostim interrogation - Performed personally and reviewed in detail today, See scanned report  ICD/PPM interrogation - Most recent remote reviewed from 10/15/2023   Physical Exam:   VS:  There were no vitals taken for this visit.   Wt Readings from Last 3 Encounters:  05/07/23 167 lb 12.8 oz (76.1 kg)  11/07/22 167 lb 3.2 oz (75.8 kg)  06/29/22 179 lb 12.8 oz (81.6 kg)     GEN: No acute distress *** NECK: No JVD; No carotid bruits CARDIAC: {EPRHYTHM:28826}, no murmurs, rubs, gallops RESPIRATORY:  Clear to auscultation without rales, wheezing or rhonchi  ABDOMEN: Soft, non-tender, non-distended EXTREMITIES:  {EDEMA LEVEL:28147::"No"} edema; No deformity   ASSESSMENT AND PLAN:    Chronic systolic CHF  s/p Abbott CRT-D and Barostim implantation NYHA *** symptoms Device programmed at 7.6 ma  chronically Volume status *** Stable on an appropriate medical regimen Normal ICD function See Pace Art report No changes today  HTN Stable on current regimen   Vertiginous dizziness PCP follows  Chronic hypoxic respiratory failure Encouraged consistent O2 use  C-spine arthritis Pre-dated BAT and not felt to be related  Disposition:   Follow up with {EPPROVIDERS:28135} {EPFOLLOW UP:28173}   Signed, Tylene Galla, PA-C

## 2023-11-06 ENCOUNTER — Encounter: Payer: Self-pay | Admitting: Student

## 2023-11-06 ENCOUNTER — Ambulatory Visit: Attending: Student | Admitting: Student

## 2023-11-06 VITALS — BP 124/72 | HR 74 | Ht 62.0 in | Wt 166.2 lb

## 2023-11-06 DIAGNOSIS — I255 Ischemic cardiomyopathy: Secondary | ICD-10-CM

## 2023-11-06 DIAGNOSIS — I10 Essential (primary) hypertension: Secondary | ICD-10-CM

## 2023-11-06 DIAGNOSIS — Z95810 Presence of automatic (implantable) cardiac defibrillator: Secondary | ICD-10-CM | POA: Diagnosis not present

## 2023-11-06 DIAGNOSIS — I5022 Chronic systolic (congestive) heart failure: Secondary | ICD-10-CM

## 2023-11-06 DIAGNOSIS — I447 Left bundle-branch block, unspecified: Secondary | ICD-10-CM | POA: Diagnosis not present

## 2023-11-06 DIAGNOSIS — J9611 Chronic respiratory failure with hypoxia: Secondary | ICD-10-CM

## 2023-11-06 DIAGNOSIS — I25119 Atherosclerotic heart disease of native coronary artery with unspecified angina pectoris: Secondary | ICD-10-CM

## 2023-11-06 NOTE — Patient Instructions (Signed)
 Medication Instructions:  No medication changes today. *If you need a refill on your cardiac medications before your next appointment, please call your pharmacy*  Lab Work: No labwork ordered today. If you have labs (blood work) drawn today and your tests are completely normal, you will receive your results only by: MyChart Message (if you have MyChart) OR A paper copy in the mail If you have any lab test that is abnormal or we need to change your treatment, we will call you to review the results.  Testing/Procedures: No testing ordered today  Follow-Up: At [facility], you and your health needs are our priority.  As part of our continuing mission to provide you with exceptional heart care, our providers are all part of one team.  This team includes your primary Cardiologist (physician) and Advanced Practice Providers or APPs (Physician Assistants and Nurse Practitioners) who all work together to provide you with the care you need, when you need it.  Your next appointment:   6 month(s)  Provider:    Joycelyn Noa" Percell Boyers, PA-C   We recommend signing up for the patient portal called "MyChart".  Sign up information is provided on this After Visit Summary.  MyChart is used to connect with patients for Virtual Visits (Telemedicine).  Patients are able to view lab/test results, encounter notes, upcoming appointments, etc.  Non-urgent messages can be sent to your provider as well.   To learn more about what you can do with MyChart, go to [email or web address].

## 2023-11-26 ENCOUNTER — Ambulatory Visit: Attending: Cardiovascular Disease

## 2023-11-26 DIAGNOSIS — Z95810 Presence of automatic (implantable) cardiac defibrillator: Secondary | ICD-10-CM | POA: Diagnosis not present

## 2023-11-26 DIAGNOSIS — I5022 Chronic systolic (congestive) heart failure: Secondary | ICD-10-CM

## 2023-11-27 NOTE — Progress Notes (Signed)
 EPIC Encounter for ICM Monitoring  Patient Name: Allen [name] is a 89 y.o. male Date: 11/27/2023 Primary Care Physican: Veda Gerald, MD Primary Cardiologist: Londa Rival Electrophysiologist: Mealor Bi-V Pacing: 99%     7/1/20245 Weight: 167 lbs 01/16/2023 Weight: 169 lbs 01/24/2023 Weight: 168 lbs 03/26/2023 Weight: 168-169 lbs 06/14/2023 Weight: 168 lbs 08/15/2023 Weight: 168 lbs 10/23/2023 Weight: 169 lbs   AT/AF Burden <1%           Spoke with patient and heart failure questions reviewed.  Transmission results reviewed.  Pt reports a little swelling around the top of his sock but will take Furosemide  today.   Reports feeling well at this time and voices no complaints.          CorVue thoracic impedance starting downward trend on 6/16 suggesting possible fluid accumulation.  Also suggesting possible fluid accumulation from 5/31-6/9.   Pt has Barostim.   Prescribed:  Furosemide  20 mg Take 1 tablet by mouth daily as needed. Weight gain of 2-3 lbs in a 24 hour period or 5 lbs in one week.  Takes every day except for Saturday and Sunday. Jardiance  10 mg take 1 tablet before breakfast   Labs: 09/26/2023 Creatinine 1.75, BUN 23, Potassium 4.7, Sodium 143, GFR 37 A complete set of results can be found in Results Review.   Recommendations:  No changes and encouraged to call if experiencing any fluid symptoms.   Follow-up plan: ICM clinic phone appointment on 01/15/2024.  91 day device clinic remote transmission 01/14/2024.     EP/Cardiology Office Visits:  Recall 05/04/2024 with Dr Arlester Ladd.     Copy of ICM check sent to Dr. Arlester Ladd.    3 month ICM trend: 11/26/2023.    12-14 Month ICM trend:     Almyra Jain, RN 11/27/2023 7:25 AM

## 2023-12-03 NOTE — Progress Notes (Signed)
 Remote ICD transmission.

## 2023-12-03 NOTE — Addendum Note (Signed)
 Addended by: TAWNI DRILLING D on: 12/03/2023 09:20 AM   Modules accepted: Orders

## 2023-12-29 ENCOUNTER — Other Ambulatory Visit: Payer: Self-pay | Admitting: Student

## 2023-12-29 ENCOUNTER — Other Ambulatory Visit: Payer: Self-pay | Admitting: Cardiology

## 2024-01-01 MED ORDER — EMPAGLIFLOZIN 10 MG PO TABS: 10.0000 mg | ORAL_TABLET | Freq: Every day | ORAL | 0 refills | Status: DC

## 2024-01-14 ENCOUNTER — Telehealth: Payer: Self-pay | Admitting: Student

## 2024-01-14 ENCOUNTER — Ambulatory Visit (INDEPENDENT_AMBULATORY_CARE_PROVIDER_SITE_OTHER): Payer: Medicare PPO

## 2024-01-14 DIAGNOSIS — I5022 Chronic systolic (congestive) heart failure: Secondary | ICD-10-CM

## 2024-01-14 NOTE — Telephone Encounter (Signed)
 Not sure if there is someone I should send this to who is more familiar with the device to assess.

## 2024-01-14 NOTE — Telephone Encounter (Signed)
 Spoke to patient who reports discomfort at [facility] site for a month. Denies redness, swelling, drainage at site or skin related issues.   Patient is agreeable for apt to see Jodie. Advised pt scheduling will contact him for apt.   Patient was appreciative of call back.

## 2024-01-14 NOTE — Telephone Encounter (Signed)
 Pt niece called in stating pt has been complaining about his neck where barostim was placed please advise. She states he is not complaining of any other symptoms.

## 2024-01-15 ENCOUNTER — Ambulatory Visit: Attending: Cardiovascular Disease

## 2024-01-15 DIAGNOSIS — I5022 Chronic systolic (congestive) heart failure: Secondary | ICD-10-CM

## 2024-01-15 DIAGNOSIS — Z95810 Presence of automatic (implantable) cardiac defibrillator: Secondary | ICD-10-CM

## 2024-01-16 LAB — CUP PACEART REMOTE DEVICE CHECK
Battery Remaining Longevity: 22 mo
Battery Remaining Percentage: 26 %
Battery Voltage: 2.89 V
Brady Statistic AP VP Percent: 95 %
Brady Statistic AP VS Percent: 1 %
Brady Statistic AS VP Percent: 3.6 %
Brady Statistic AS VS Percent: 1 %
Brady Statistic RA Percent Paced: 94 %
Date Time Interrogation Session: 20250804020015
HighPow Impedance: 70 Ohm
HighPow Impedance: 70 Ohm
Implantable Lead Connection Status: 753985
Implantable Lead Connection Status: 753985
Implantable Lead Connection Status: 753985
Implantable Lead Implant Date: 20200205
Implantable Lead Implant Date: 20200205
Implantable Lead Implant Date: 20200205
Implantable Lead Location: 753858
Implantable Lead Location: 753859
Implantable Lead Location: 753860
Implantable Pulse Generator Implant Date: 20200205
Lead Channel Impedance Value: 1025 Ohm
Lead Channel Impedance Value: 460 Ohm
Lead Channel Impedance Value: 530 Ohm
Lead Channel Pacing Threshold Amplitude: 0.625 V
Lead Channel Pacing Threshold Amplitude: 1.25 V
Lead Channel Pacing Threshold Amplitude: 1.25 V
Lead Channel Pacing Threshold Pulse Width: 0.5 ms
Lead Channel Pacing Threshold Pulse Width: 0.5 ms
Lead Channel Pacing Threshold Pulse Width: 0.5 ms
Lead Channel Sensing Intrinsic Amplitude: 12 mV
Lead Channel Sensing Intrinsic Amplitude: 3 mV
Lead Channel Setting Pacing Amplitude: 2 V
Lead Channel Setting Pacing Amplitude: 2.5 V
Lead Channel Setting Pacing Amplitude: 2.5 V
Lead Channel Setting Pacing Pulse Width: 0.5 ms
Lead Channel Setting Pacing Pulse Width: 0.5 ms
Lead Channel Setting Sensing Sensitivity: 0.5 mV
Pulse Gen Serial Number: 9880571

## 2024-01-18 NOTE — Progress Notes (Signed)
 EPIC Encounter for ICM Monitoring  Patient Name: Allen [name] is a 89 y.o. male Date: 01/18/2024 Primary Care Physican: Orpha Yancey LABOR, MD Primary Cardiologist: Debera Electrophysiologist: Mealor Bi-V Pacing: 99%     7/1/20245 Weight: 167 lbs 01/16/2023 Weight: 169 lbs 01/24/2023 Weight: 168 lbs 03/26/2023 Weight: 168-169 lbs 06/14/2023 Weight: 168 lbs 08/15/2023 Weight: 168 lbs 10/23/2023 Weight: 169 lbs 01/18/2024 Weight: 169-170 lbs   AT/AF [place] <1%           Spoke with patient and heart failure questions reviewed.  Transmission results reviewed.  Pt has pain from the Barostim device to top of his head.  He went to ED on 8/7 and received pain pills but it is not helping the pain.  8/4 phone note shows patient's niece contacted cardiology and he has appt with Jodie Passey, PA for follow up on the Barostim.     No fluid sx noted during decreased impedance.         CorVue thoracic impedance suggesting normal fluid levels with the exception of possible fluid accumulation from 7/7-7/16 and 7/25-7/30.   Pt has Barostim.   Prescribed:  Furosemide  20 mg Take 1 tablet by mouth daily as needed. Weight gain of 2-3 lbs in a 24 hour period or 5 lbs in one week.  Takes every day except for Saturday and Sunday. Jardiance  10 mg take 1 tablet before breakfast   Labs: 09/26/2023 Creatinine 1.75, BUN 23, Potassium 4.7, Sodium 143, GFR 37 A complete set of results can be found in Results Review.   Recommendations:  Advised to use ER if needed over the weekend if pain gets worse.  Copy sent to Jodie Passey, PA a message for any further recommendations.     Follow-up plan: ICM clinic phone appointment on 02/18/2024.  91 day device clinic remote transmission 04/14/2024.     EP/Cardiology Office Visits:  02/15/2024 with Jodie Passey, PA.  Recall 05/04/2024 with Dr Nancey.     Copy of ICM check sent to Dr. Nancey.    3 month ICM trend: 01/14/2024.    12-14 Month ICM trend:     Mitzie GORMAN Garner,  RN 01/18/2024 5:16 PM

## 2024-01-21 ENCOUNTER — Telehealth: Payer: Self-pay

## 2024-01-21 NOTE — Progress Notes (Signed)
 Spoke with patient.  He went to ED at [facility] 8/7 and he reports tests came back negative.  He has pain pills he is taking for pain that travels to his head that are now helping some.  Explained Andy's recommendation and he would like to be seen as soon as can to check the Barostim device.  Advised will call back with any options available.

## 2024-01-21 NOTE — Progress Notes (Signed)
  Received: 3 days ago Lesia Ozell Barter, PA-C  Livvy Spilman, Mitzie RAMAN, RN We can try to get him a visit in EP APP, device, or HF clinic next week to turn him down.    They will need to contact Rockey Posey or [employer] for industry support; I'm out all week.    Pts can sometimes have symptoms when the device is first implanted but I have not personally seen someone this far out develop new symptoms.  If it is felt like there is a problem with the neck incision he may need to see Vascular.

## 2024-01-21 NOTE — Telephone Encounter (Signed)
 Contacted Barostim Rep [place] ([place] area) who stated she will reach out to the [place] rep who will contact our office to coordinate a day/time to bring patient into office to make programming changes.

## 2024-01-21 NOTE — Progress Notes (Signed)
 Discussed with Anette Shutter, RN device clinic regarding barostim adjustment.  Pt has device clinic office visit with Barostim rep to make barostim adjustments if needed.

## 2024-01-21 NOTE — Telephone Encounter (Signed)
 Spoke with Anette Shutter RN in device clinic.  Will make patient a device clinic appt and contact rep to program changes needed for Barostim.

## 2024-01-21 NOTE — Telephone Encounter (Signed)
 ICM call to patient regarding Jodie Passey PA recommendations regarding the pain he continues to have stong pain starting in the Barostim area to the top of his head (complaints previously addressed in 8/4 phone note by device clinc).  Pt has OV scheduled 9/5 with Jodie regarding patient's complaints.    Spoke with patient today.  He reported ED visit over the weekend regarding same complaint and all tests were negaive.  He was given pain med which is helping some at times.   Advised of Jodie recommendations from [facility] note last week (see 8/5 ICM note).    Advised can try to get him an earlier visit with EP APP, device, or HF clinic this week to turn Barostim down. Will need to contact Rockey Posey or [employer] for industry support since Jodie is out of the office this week.    If it is felt like there is a problem with the neck incision he may need to see Vascular.    Pt would like to be seen this week to check the Barostim device.  Advised will call back regarding an office appt.

## 2024-01-22 ENCOUNTER — Ambulatory Visit: Payer: Self-pay | Admitting: Cardiovascular Disease

## 2024-01-22 NOTE — Telephone Encounter (Signed)
 Device clinic apt made 01/23/24 and rep will be present.

## 2024-01-23 ENCOUNTER — Telehealth: Payer: Self-pay

## 2024-01-23 ENCOUNTER — Encounter

## 2024-01-23 NOTE — Telephone Encounter (Signed)
 Outreach made to Pt.  Advised need to reschedule device check scheduled for today 01/23/2024 for later this afternoon.  Pt unable to come this afternoon.  Unable to come any other days this week.  Rescheduled Pt for 01/28/2024 at 12:00 pm.  Will let Barostim rep know.

## 2024-01-28 ENCOUNTER — Ambulatory Visit: Attending: Cardiology

## 2024-01-28 DIAGNOSIS — I517 Cardiomegaly: Secondary | ICD-10-CM

## 2024-01-28 NOTE — Progress Notes (Signed)
 Patient seen in device clinic by [facility] rep Rockey.

## 2024-01-29 ENCOUNTER — Other Ambulatory Visit: Payer: Self-pay | Admitting: Cardiology

## 2024-02-08 ENCOUNTER — Encounter: Payer: Self-pay | Admitting: Urology

## 2024-02-08 ENCOUNTER — Ambulatory Visit (INDEPENDENT_AMBULATORY_CARE_PROVIDER_SITE_OTHER): Admitting: Urology

## 2024-02-08 VITALS — BP 137/82 | HR 86

## 2024-02-08 DIAGNOSIS — N481 Balanitis: Secondary | ICD-10-CM | POA: Diagnosis not present

## 2024-02-08 DIAGNOSIS — N471 Phimosis: Secondary | ICD-10-CM | POA: Diagnosis not present

## 2024-02-08 DIAGNOSIS — Z412 Encounter for routine and ritual male circumcision: Secondary | ICD-10-CM

## 2024-02-08 MED ORDER — CLOTRIMAZOLE-BETAMETHASONE 1-0.05 % EX CREA
1.0000 | TOPICAL_CREAM | Freq: Two times a day (BID) | CUTANEOUS | 3 refills | Status: DC
Start: 2024-02-08 — End: 2024-04-23

## 2024-02-08 NOTE — Progress Notes (Signed)
 02/08/2024 10:30 AM   Allen [name] 04-13-1934 [id number]  Referring provider: Orpha Yancey LABOR, MD [address] [place],  [place] [address]  Penile irritation   HPI: Allen [name] is a 89yo here for evaluation of penile irritation. For the past 8-9 months he has noted difficulty retracting his foreskin which makes it difficult for him to urinate. He has intermittent cracking of his foreskin. He has irriation of his glans   PMH: Past Medical History:  Diagnosis Date   AICD (automatic cardioverter/defibrillator) present    Biventricular ICD (implantable cardioverter-defibrillator) in place 07/17/2018   CAD (coronary artery disease)    a. 04/2016: s/p CABG with LIMA-LAD, SVG-D1, SVG-RCA, and seq SVG-OM1-OM2   CHF (congestive heart failure) (HCC)    CKD (chronic kidney disease)    Essential hypertension    Hyperlipidemia    Ischemic cardiomyopathy    LVEF 35% May 2016   LBBB (left bundle branch block)    Pneumonia due to COVID-19 virus    Sinus bradycardia    Type 2 diabetes mellitus (HCC)     Surgical History: Past Surgical History:  Procedure Laterality Date   BIV ICD INSERTION CRT-D  07/17/2018   BIV ICD INSERTION CRT-D N/A 07/17/2018   Procedure: BIV ICD INSERTION CRT-D;  Surgeon: Kelsie Agent, MD;  Location: [facility] INVASIVE CV LAB;  Service: Cardiovascular;  Laterality: N/A;   CARDIAC CATHETERIZATION N/A 11/02/2014   Procedure: Left Heart Cath and Coronary Angiography;  Surgeon: Victory LELON Sharps, MD;  Location: [facility] INVASIVE CV LAB;  Service: Cardiovascular;  Laterality: N/A;   CARDIAC CATHETERIZATION N/A 05/03/2016   Procedure: Left Heart Cath and Coronary Angiography;  Surgeon: Peter M [place], MD;  Location[facility] INVASIVE CV LAB;  Service: Cardiovascular;  Laterality: N/A;   CORONARY ARTERY BYPASS GRAFT N/A 05/08/2016   Procedure: CORONARY ARTERY BYPASS GRAFTING (CABG) x 5;  Surgeon: Dorise MARLA Fellers, MD;  Location: [facility] OR;  Service: Open Heart Surgery;  Laterality: N/A;   ENDOVEIN  HARVEST OF GREATER SAPHENOUS VEIN  05/08/2016   Procedure: ENDOVEIN HARVEST OF GREATER SAPHENOUS VEIN;  Surgeon: Dorise MARLA Fellers, MD;  Location: [facility] OR;  Service: Open Heart Surgery;;   TEE WITHOUT CARDIOVERSION N/A 05/08/2016   Procedure: TRANSESOPHAGEAL ECHOCARDIOGRAM (TEE);  Surgeon: Dorise MARLA Fellers, MD;  Location: [facility] OR;  Service: Open Heart Surgery;  Laterality: N/A;    Home Medications:  Allergies as of 02/08/2024   No Known Allergies      Medication List        Accurate as of February 08, 2024 10:30 AM. If you have any questions, ask your nurse or doctor.          aspirin  EC 81 MG tablet Take 81 mg by mouth daily.   atorvastatin  80 MG tablet Commonly known as: LIPITOR  Take 1 tablet (80 mg total) by mouth daily at 6 PM.   carvedilol  6.25 MG tablet Commonly known as: COREG  TAKE 1 TABLET BY MOUTH TWICE A DAY WITH FOOD   clopidogrel  75 MG tablet Commonly known as: PLAVIX  TAKE 1 TABLET BY MOUTH EVERY DAY WITH BREAKFAST   cyanocobalamin 1000 MCG tablet Commonly known as: VITAMIN B12 Take 1,000 mcg by mouth daily.   empagliflozin  10 MG Tabs tablet Commonly known as: Jardiance  Take 1 tablet (10 mg total) by mouth daily before breakfast.   Entresto  49-51 MG Generic drug: sacubitril -valsartan  TAKE 1 TABLET BY MOUTH TWICE A DAY   furosemide  20 MG tablet Commonly known as: LASIX  Take 1  tablet (20 mg total) by mouth daily as needed (Weight gain of 2-3 lbs in a 24 hour period or 5 lbs in one week).   ibuprofen 800 MG tablet Commonly known as: ADVIL Take 800 mg by mouth every 6 (six) hours as needed.   insulin  glargine 100 UNIT/ML Solostar Pen Commonly known as: Lantus  SoloStar Inject 20 Units into the skin at bedtime.   ipratropium 0.03 % nasal spray Commonly known as: ATROVENT Place into both nostrils.   methocarbamol 500 MG tablet Commonly known as: ROBAXIN Take 500 mg by mouth daily.   nitroGLYCERIN  0.4 MG SL tablet Commonly known as: NITROSTAT  Place 0.4  mg under the tongue every 5 (five) minutes as needed for chest pain (If no relief after 3rd dose, proceed to the ED).   OXYGEN  Inhale 3 L into the lungs continuous. 3 lpm 24/7 DME- [employer]   repaglinide 2 MG tablet Commonly known as: PRANDIN Take 2 mg by mouth 2 (two) times daily before a meal.        Allergies: No Known Allergies  Family History: Family History  Problem Relation Age of Onset   Unexplained death Father    Unexplained death Mother        Old age   Coronary artery disease Brother 26    Social History:  reports that he quit smoking about 12 years ago. His smoking use included cigarettes. He started smoking about 68 years ago. He has a 28 pack-year smoking history. He has never used smokeless tobacco. He reports that he does not drink alcohol and does not use drugs.  ROS: All other review of systems were reviewed and are negative except what is noted above in HPI  Physical Exam: BP 137/82   Pulse 86   Constitutional:  Alert and oriented, No acute distress. HEENT: [place] AT, moist mucus membranes.  Trachea midline, no masses. Cardiovascular: No clubbing, cyanosis, or edema. Respiratory: Normal respiratory effort, no increased work of breathing. GI: Abdomen is soft, nontender, nondistended, no abdominal masses GU: No CVA tenderness. Uncircumcised phallus. Balanitis present. No masses/lesions on penis, testis, scrotum.  Lymph: No cervical or inguinal lymphadenopathy. Skin: No rashes, bruises or suspicious lesions. Neurologic: Grossly intact, no focal deficits, moving all 4 extremities. Psychiatric: Normal mood and affect.  Laboratory Data: Lab Results  Component Value Date   WBC 7.2 11/07/2022   HGB 17.3 11/07/2022   HCT 53.5 (H) 11/07/2022   MCV 95 11/07/2022   PLT 178 11/07/2022    Lab Results  Component Value Date   CREATININE 1.50 (H) 11/07/2022    No results found for: PSA  No results found for: TESTOSTERONE  Lab Results  Component Value  Date   HGBA1C 7.3 (H) 05/24/2021    Urinalysis    Component Value Date/Time   COLORURINE YELLOW 05/24/2021 1124   APPEARANCEUR CLEAR 05/24/2021 1124   LABSPEC 1.020 05/24/2021 1124   PHURINE 6.0 05/24/2021 1124   GLUCOSEU >=500 (A) 05/24/2021 1124   HGBUR TRACE (A) 05/24/2021 1124   BILIRUBINUR NEGATIVE 05/24/2021 1124   KETONESUR NEGATIVE 05/24/2021 1124   PROTEINUR NEGATIVE 05/24/2021 1124   NITRITE NEGATIVE 05/24/2021 1124   LEUKOCYTESUR NEGATIVE 05/24/2021 1124    Lab Results  Component Value Date   BACTERIA NONE SEEN 05/24/2021    Pertinent Imaging:  No results found for this or any previous visit.  No results found for this or any previous visit.  No results found for this or any previous visit.  No results found  for this or any previous visit.  No results found for this or any previous visit.  No results found for this or any previous visit.  No results found for this or any previous visit.  No results found for this or any previous visit.   Assessment & Plan:    1. Encounter for circumcision (Primary) -The risks/benefits/alternatives to circumcision was explained to the patient and he understands and wishes to proceed with surgery - Urinalysis, Routine w reflex microscopic   No follow-ups on file.  Belvie Clara, MD  [facility] [employer]

## 2024-02-08 NOTE — Patient Instructions (Signed)
 Balanitis  Balanitis is swelling and irritation of the head of the penis (glans penis). Balanitis occurs most often among males who have not had their foreskin removed (uncircumcised). In uncircumcised males, the condition may also cause inflammation of the skin around the foreskin. Balanitis sometimes causes scarring of the penis or foreskin, which can require surgery. This condition may develop because of an infection or another medical condition. Untreated balanitis can increase the risk of penile cancer. What are the causes? Common causes of this condition include: Irritation and lack of airflow due to fluid (smegma) that can build up on the glans penis. Poor personal hygiene, especially in uncircumcised males. Not cleaning the glans penis and foreskin well can result in a buildup of bacteria, viruses, and yeast, which can lead to infection and inflammation. Other causes include: Chemical irritation from products such as soaps or shower gels, especially those that have fragrance. Chemical irritation can also be caused by condoms, personal lubricants, petroleum jelly, spermicides, fabric softeners, or laundry detergents. Skin conditions, such as eczema, dermatitis, and psoriasis. Allergies to medicines, such as tetracycline and sulfa drugs. What increases the risk? The following factors may make you more likely to develop this condition: Being an uncircumcised male. Having diabetes. Having other medical conditions, including liver cirrhosis, congestive heart failure, or kidney disease. Having infections, such as candidiasis, HPV (human papillomavirus), herpes simplex, gonorrhea, or syphilis. Having a tight foreskin that is difficult to pull back (retract) past the glans penis. Being severely obese. History of reactive arthritis. What are the signs or symptoms? Symptoms of this condition include: Discharge from under the foreskin, and pain or difficulty retracting the foreskin. A bad smell  or itchiness on the penis. Tenderness, redness, and swelling of the glans penis. A rash or sores on the glans penis or foreskin. Inability to get an erection due to pain. Trouble urinating. Scarring of the penis or foreskin, in some cases. How is this diagnosed? This condition may be diagnosed based on a physical exam and tests of a swab of discharge to check for bacterial or fungal infection. You may also have blood tests to check for: Viruses that can cause balanitis. A high blood sugar (glucose) level. This could be a sign of diabetes, which can increase the risk of balanitis. How is this treated? Treatment for this condition depends on the cause. Treatment may include: Improving personal hygiene. Your health care provider may recommend sitting in a bath of warm water that is deep enough to cover your hips and buttocks (sitz bath). Medicines such as: Creams or ointments to reduce swelling (steroids) or to treat an infection. Antibiotic medicine. Antifungal medicine. Having surgery to remove or cut the foreskin (circumcision). This may be done if you have scarring on the foreskin that makes it difficult to retract. Controlling other medical problems that may be causing your condition or making it worse. Follow these instructions at home: Medicines Take over-the-counter and prescription medicines only as told by your health care provider. If you were prescribed an antibiotic medicine, use it as told by your health care provider. Do not stop using the antibiotic even if you start to feel better. General instructions Do not have sex until the condition clears up, or until your health care provider approves. Keep your penis clean and dry. Take sitz baths as recommended by your health care provider. Avoid products that irritate your skin or make symptoms worse, such as soaps and shower gels that have fragrance. Keep all follow-up visits. This is  important. Contact a health care provider  if: Your symptoms get worse or do not improve with home care. You develop chills or a fever. You have trouble urinating. You cannot retract your foreskin. Get help right away if: You develop severe pain. You are unable to urinate. Summary Balanitis is swelling and irritation of the head of the penis (glans penis). This condition is most common among uncircumcised males. Balanitis causes pain, redness, and swelling of the glans penis. Good personal hygiene is important. Treatment may include improving personal hygiene and applying creams or ointments. Contact a health care provider if your symptoms get worse or do not improve with home care. This information is not intended to replace advice given to you by your health care provider. Make sure you discuss any questions you have with your health care provider. Document Revised: 11/10/2020 Document Reviewed: 11/10/2020 Elsevier Patient Education  2024 [employer].

## 2024-02-14 NOTE — Progress Notes (Deleted)
  Cardiology Office Note:   Date:  02/14/2024  ID:  Allen [name], DOB 1934-06-10, MRN [id number]  Primary Cardiologist: Allen Sierras, MD Electrophysiologist: Eulas FORBES Furbish, MD   History of Present Illness:   Allen [name] is a 89 y.o. male with h/o chronic systolic CHF, HTN, vertiginous, chronic hypoxic respiratory failure, and C-spine arthritis seen today for acute visit due to on-going neck discomfort.    Patient reports ***.  he denies chest pain, palpitations, dyspnea, PND, orthopnea, nausea, vomiting, dizziness, syncope, edema, weight gain, or early satiety.     Review of systems complete and found to be negative unless listed in HPI.    EP Information / Studies Reviewed:    EKG is ordered today. Personal review as below.  ***  Arrhythmia/Device History Barostim (standard) implanted 05/27/2021 for Chronic systolic CHF St. Jude BiV ICD implanted 07/17/2018 for ICM, NYHA III, SSS, LBBB    Barostim Interrogation- Performed personally and reviewed in detail today,  See scanned report  ICD/PPM interrogation - Not performed today. See last [employer] report    Physical Exam:   VS:  There were no vitals taken for this visit.   Wt Readings from Last 3 Encounters:  11/06/23 166 lb 3.2 oz (75.4 kg)  05/07/23 167 lb 12.8 oz (76.1 kg)  11/07/22 167 lb 3.2 oz (75.8 kg)     GEN: Well nourished, well developed in no acute distress NECK: No JVD; No carotid bruits CARDIAC: {EPRHYTHM:28826}, no murmurs, rubs, gallops RESPIRATORY:  Clear to auscultation without rales, wheezing or rhonchi  ABDOMEN: Soft, non-tender, non-distended EXTREMITIES:  {EDEMA LEVEL:28147::No} edema; No deformity   ASSESSMENT AND PLAN:    Chronic systolic CHF s/p Abbott Defibrillator and Barostim implantation NYHA *** symptoms. ***  Device {Blank single:19197::titrated from *** millamp to *** milliamp by increments of 0.4 with good blood pressure response and no stimulation.,programmed at *** for  chronic settings}  Device impedence stable. Pt goals are to ***  Normal device function See scanned report. Will follow up in *** weeks to continue titration with goal of 6-8 milliamps for chronic settings.   HTN Stable on current regimen   Vertiginous dizziness PCP follows  Chronic hypoxic respiratory failure Encourage consistent O2 use.  C-spine arthritis Neck discomfort Chronic component confounds Disposition:   Follow up with {EPPROVIDERS:28135::EP Team} in {EPFOLLOW UP:28173}  Signed, Ozell Prentice Passey, PA-C

## 2024-02-15 ENCOUNTER — Telehealth: Payer: Self-pay | Admitting: *Deleted

## 2024-02-15 ENCOUNTER — Ambulatory Visit: Attending: Student | Admitting: Student

## 2024-02-15 DIAGNOSIS — Z95810 Presence of automatic (implantable) cardiac defibrillator: Secondary | ICD-10-CM

## 2024-02-15 DIAGNOSIS — I25119 Atherosclerotic heart disease of native coronary artery with unspecified angina pectoris: Secondary | ICD-10-CM

## 2024-02-15 DIAGNOSIS — I5022 Chronic systolic (congestive) heart failure: Secondary | ICD-10-CM

## 2024-02-15 DIAGNOSIS — I447 Left bundle-branch block, unspecified: Secondary | ICD-10-CM

## 2024-02-15 DIAGNOSIS — I255 Ischemic cardiomyopathy: Secondary | ICD-10-CM

## 2024-02-15 NOTE — Telephone Encounter (Signed)
 I s/w the pt and we talked about needing a tele preop appt. Pt asked for me to please s/w his niece Reena as she handles all of his appts'. I called Reena (DPR). I explained to Reena what we were needing to do. She agrees to help her uncle, the pt with the tele preop appt 03/06/24. Med rec and consent are done. Reena asked if we could call her # as her uncle as a flip phone. I will change the # to call her # [phone].     Patient Consent for Virtual Visit        Allen [name] has provided verbal consent on 02/15/2024 for a virtual visit (video or telephone).   CONSENT FOR VIRTUAL VISIT FOR:  Allen [name]  By participating in this virtual visit I agree to the following:  I hereby voluntarily request, consent and authorize [facility] and its employed or contracted physicians, physician assistants, nurse practitioners or other licensed health care professionals (the Practitioner), to provide me with telemedicine health care services (the "Services) as deemed necessary by the treating Practitioner. I acknowledge and consent to receive the Services by the Practitioner via telemedicine. I understand that the telemedicine visit will involve communicating with the Practitioner through live audiovisual communication technology and the disclosure of certain medical information by electronic transmission. I acknowledge that I have been given the opportunity to request an in-person assessment or other available alternative prior to the telemedicine visit and am voluntarily participating in the telemedicine visit.  I understand that I have the right to withhold or withdraw my consent to the use of telemedicine in the course of my care at any time, without affecting my right to future care or treatment, and that the Practitioner or I may terminate the telemedicine visit at any time. I understand that I have the right to inspect all information obtained and/or recorded in the course of the  telemedicine visit and may receive copies of available information for a reasonable fee.  I understand that some of the potential risks of receiving the Services via telemedicine include:  Delay or interruption in medical evaluation due to technological equipment failure or disruption; Information transmitted may not be sufficient (e.g. poor resolution of images) to allow for appropriate medical decision making by the Practitioner; and/or  In rare instances, security protocols could fail, causing a breach of personal health information.  Furthermore, I acknowledge that it is my responsibility to provide information about my medical history, conditions and care that is complete and accurate to the best of my ability. I acknowledge that Practitioner's advice, recommendations, and/or decision may be based on factors not within their control, such as incomplete or inaccurate data provided by me or distortions of diagnostic images or specimens that may result from electronic transmissions. I understand that the practice of medicine is not an exact science and that Practitioner makes no warranties or guarantees regarding treatment outcomes. I acknowledge that a copy of this consent can be made available to me via my patient portal [facility] MyChart), or I can request a printed copy by calling the office of [facility].    I understand that my insurance will be billed for this visit.   I have read or had this consent read to me. I understand the contents of this consent, which adequately explains the benefits and risks of the Services being provided via telemedicine.  I have been provided ample opportunity to ask questions regarding this consent  and the Services and have had my questions answered to my satisfaction. I give my informed consent for the services to be provided through the use of telemedicine in my medical care

## 2024-02-15 NOTE — Telephone Encounter (Signed)
 I s/w the pt and we talked about needing a tele preop appt. Pt asked for me to please s/w his niece Reena as she handles all of his appts'. I called Reena (DPR). I explained to Reena what we were needing to do. She agrees to help her uncle, the pt with the tele preop appt 03/06/24. Med rec and consent are done. Reena asked if we could call her # as her uncle as a flip phone. I will change the # to call her # [phone].

## 2024-02-15 NOTE — Telephone Encounter (Signed)
   Pre-operative Risk Assessment    Patient Name: Allen [name]  DOB: March 31, 1934 MRN: [id number]   Date of last office visit: 11/06/23 JODIE PASSEY, [facility] Date of next office visit: 02/15/24 JODIE PASSEY, PAC; LOOKS TO BE THE PT MISSED APPT    Request for Surgical Clearance    Procedure:  CIRCUMCISION  Date of Surgery:  Clearance 03/20/24                                Surgeon:  DR. SHERRILEE Surgeon's Group or Practice Name:  [facility] [place]  Phone number:  [phone] Fax number:  [phone]   Type of Clearance Requested:   - Medical  - Pharmacy:  Hold Clopidogrel  (Plavix ) x 5 DAYS PRIOR   Type of Anesthesia:  General    Additional requests/questions:    [name]   02/15/2024, 3:57 PM

## 2024-02-15 NOTE — Telephone Encounter (Signed)
   Name: Allen [name]  DOB: 03/09/1934  MRN: [id number]  Primary Cardiologist: Jayson Sierras, MD   Preoperative team, please contact this patient and set up a phone call appointment for further preoperative risk assessment. Please obtain consent and complete medication review. Thank you for your help.  I confirm that guidance regarding antiplatelet and oral anticoagulation therapy has been completed and, if necessary, noted below.  Per office protocol, if patient is without any new symptoms or concerns at the time of their virtual visit, he may hold Plavix  for 5  days prior to procedure. Please resume Plavix  as soon as possible postprocedure, at the discretion of the surgeon.    I also confirmed the patient resides in the state of [place] . As per [facility] Medical Board telemedicine laws, the patient must reside in the state in which the provider is licensed.   [name], NP 02/15/2024, 4:11 PM [facility]

## 2024-02-18 ENCOUNTER — Ambulatory Visit: Attending: Cardiovascular Disease

## 2024-02-18 DIAGNOSIS — Z95810 Presence of automatic (implantable) cardiac defibrillator: Secondary | ICD-10-CM

## 2024-02-18 DIAGNOSIS — I5022 Chronic systolic (congestive) heart failure: Secondary | ICD-10-CM | POA: Diagnosis not present

## 2024-02-19 NOTE — Progress Notes (Signed)
 EPIC Encounter for ICM Monitoring  Patient Name: Allen [name] is a 89 y.o. male Date: 02/19/2024 Primary Care Physican: Orpha Yancey LABOR, MD Primary Cardiologist: Debera Electrophysiologist: Mealor Bi-V Pacing: 99%     7/1/20245 Weight: 167 lbs 01/16/2023 Weight: 169 lbs 01/24/2023 Weight: 168 lbs 03/26/2023 Weight: 168-169 lbs 06/14/2023 Weight: 168 lbs 08/15/2023 Weight: 168 lbs 10/23/2023 Weight: 169 lbs 01/18/2024 Weight: 169-170 lbs 02/19/2024 Weight: 166 lbs   AT/AF Burden <1%           Spoke with patient and heart failure questions reviewed.  Transmission results reviewed.  Pt asymptomatic for fluid accumulation.  Reports feeling well at this time and voices no complaints.          CorVue thoracic impedance suggesting normal fluid levels within the last month.   Pt has Barostim.  Barostim was adjusted 01/28/2024 and has not had any pain since that time.   Prescribed:  Furosemide  20 mg Take 1 tablet by mouth daily as needed. Weight gain of 2-3 lbs in a 24 hour period or 5 lbs in one week.  Takes every day except for Saturday and Sunday. Jardiance  10 mg take 1 tablet before breakfast   Labs: 01/17/2024 Creatinine 1.54, BUN 19, Potassium 5.1, Sodium 145, GFR 43 09/26/2023 Creatinine 1.75, BUN 23, Potassium 4.7, Sodium 143, GFR 37 A complete set of results can be found in Results Review.   Recommendations:  No changes and encouraged to call if experiencing any fluid symptoms.    Follow-up plan: ICM clinic phone appointment on 03/31/2024.  91 day device clinic remote transmission 04/14/2024.     EP/Cardiology Office Visits:    Recall 05/04/2024 with Dr Nancey.     Copy of ICM check sent to Dr. Nancey.     3 month ICM trend: 02/18/2024.    12-14 Month ICM trend:     Mitzie GORMAN Garner, RN 02/19/2024 4:19 PM

## 2024-03-06 ENCOUNTER — Ambulatory Visit: Attending: Cardiology | Admitting: Emergency Medicine

## 2024-03-06 NOTE — Progress Notes (Signed)
 I have reviewed the schedules for [facility] office and there are no openings in time for pt's procedure. I will send a message to the [facility] team to see if they can get the pt in in time for preop clearance. Surgery is planned for 03/20/24.

## 2024-03-06 NOTE — Telephone Encounter (Signed)
 Patient was scheduled for televisit today.  He lives in [place], [place] . As per [facility] Medical Board telemedicine laws, the patient must reside in the state in which the provider is licensed.   He will need an office visit for further evaluation.

## 2024-03-06 NOTE — Telephone Encounter (Signed)
 I have reviewed the schedules for [facility] office and there are no openings in time for pt's procedure. I will send a message to the [facility] team to see if they can get the pt in in time for preop clearance. Surgery is planned for 03/20/24.

## 2024-03-06 NOTE — Progress Notes (Deleted)
 Virtual Visit via Telephone Note   Because of Allen [name] co-morbid illnesses, he is at least at moderate risk for complications without adequate follow up.  This format is felt to be most appropriate for this patient at this time.  Due to technical limitations with video connection (technology), today's appointment will be conducted as an audio only telehealth visit, and Allen [name] verbally agreed to proceed in this manner.   All issues noted in this document were discussed and addressed.  No physical exam could be performed with this format.  Evaluation Performed:  Preoperative cardiovascular risk assessment _____________   Date:  03/06/2024   Patient ID:  Allen [name], DOB May 12, 1934, MRN [id number] Patient Location:  Home Provider location:   Office  Primary Care Provider:  Orpha Yancey LABOR, MD Primary Cardiologist:  Allen Sierras, MD  Chief Complaint / Patient Profile   89 y.o. y/o male with a h/o chronic systolic CHF, s/p biventricular ICD, multivessel CAD s/p CABG, hypertension, vertiginous, chronic hypoxic respiratory failure, and C-spine arthritis  who is pending *** and presents today for telephonic preoperative cardiovascular risk assessment.  History of Present Illness    Allen [name] is a 89 y.o. male who presents via audio/video conferencing for a telehealth visit today.  Pt was last seen in cardiology clinic on 11/06/2023 by Ozell Passey, PA.  At that time Allen [name] was doing well.  The patient is now pending procedure as outlined above. Since his last visit, he ***  Past Medical History    Past Medical History:  Diagnosis Date   AICD (automatic cardioverter/defibrillator) present    Biventricular ICD (implantable cardioverter-defibrillator) in place 07/17/2018   CAD (coronary artery disease)    a. 04/2016: s/p CABG with LIMA-LAD, SVG-D1, SVG-RCA, and seq SVG-OM1-OM2   CHF (congestive heart failure) (HCC)    CKD (chronic kidney disease)     Essential hypertension    Hyperlipidemia    Ischemic cardiomyopathy    LVEF 35% May 2016   LBBB (left bundle branch block)    Pneumonia due to COVID-19 virus    Sinus bradycardia    Type 2 diabetes mellitus (HCC)    Past Surgical History:  Procedure Laterality Date   BIV ICD INSERTION CRT-D  07/17/2018   BIV ICD INSERTION CRT-D N/A 07/17/2018   Procedure: BIV ICD INSERTION CRT-D;  Surgeon: Kelsie Agent, MD;  Location: [facility] INVASIVE CV LAB;  Service: Cardiovascular;  Laterality: N/A;   CARDIAC CATHETERIZATION N/A 11/02/2014   Procedure: Left Heart Cath and Coronary Angiography;  Surgeon: Victory LELON Sharps, MD;  Location: [facility] INVASIVE CV LAB;  Service: Cardiovascular;  Laterality: N/A;   CARDIAC CATHETERIZATION N/A 05/03/2016   Procedure: Left Heart Cath and Coronary Angiography;  Surgeon: Peter M Swaziland, MD;  Location: [facility] INVASIVE CV LAB;  Service: Cardiovascular;  Laterality: N/A;   CORONARY ARTERY BYPASS GRAFT N/A 05/08/2016   Procedure: CORONARY ARTERY BYPASS GRAFTING (CABG) x 5;  Surgeon: Dorise MARLA Fellers, MD;  Location: [facility] OR;  Service: Open Heart Surgery;  Laterality: N/A;   ENDOVEIN HARVEST OF GREATER SAPHENOUS VEIN  05/08/2016   Procedure: ENDOVEIN HARVEST OF GREATER SAPHENOUS VEIN;  Surgeon: Dorise MARLA Fellers, MD;  Location: [facility] OR;  Service: Open Heart Surgery;;   TEE WITHOUT CARDIOVERSION N/A 05/08/2016   Procedure: TRANSESOPHAGEAL ECHOCARDIOGRAM (TEE);  Surgeon: Dorise MARLA Fellers, MD;  Location: [facility] OR;  Service: Open Heart Surgery;  Laterality: N/A;    Allergies  No Known Allergies  Home  Medications    Prior to Admission medications   Medication Sig Start Date End Date Taking? Authorizing Provider  aspirin  81 MG EC tablet Take 81 mg by mouth daily.    [provider]  atorvastatin  (LIPITOR ) 80 MG tablet Take 1 tablet (80 mg total) by mouth daily at 6 PM. 11/26/14   Micky Reyes BIRCH, MD  carvedilol  (COREG ) 6.25 MG tablet TAKE 1 TABLET BY MOUTH TWICE A DAY WITH FOOD 01/31/24    Debera Allen MATSU, MD  clopidogrel  (PLAVIX ) 75 MG tablet TAKE 1 TABLET BY MOUTH EVERY DAY WITH BREAKFAST 07/02/23   Debera Allen MATSU, MD  clotrimazole -betamethasone  (LOTRISONE ) cream Apply 1 Application topically 2 (two) times daily. 02/08/24   McKenzie, Belvie CROME, MD  empagliflozin  (JARDIANCE ) 10 MG TABS tablet Take 1 tablet (10 mg total) by mouth daily before breakfast. 01/01/24   Debera Allen MATSU, MD  ENTRESTO  49-51 MG TAKE 1 TABLET BY MOUTH TWICE A DAY 01/01/24   Bensimhon, Toribio SAUNDERS, MD  furosemide  (LASIX ) 20 MG tablet Take 1 tablet (20 mg total) by mouth daily as needed (Weight gain of 2-3 lbs in a 24 hour period or 5 lbs in one week). 02/23/23   Debera Allen MATSU, MD  ibuprofen (ADVIL) 800 MG tablet Take 800 mg by mouth every 6 (six) hours as needed. 08/03/21   [provider]  Insulin  Glargine (LANTUS  SOLOSTAR) 100 UNIT/ML Solostar Pen Inject 20 Units into the skin at bedtime. 05/16/16   Gold, Wayne E, PA-C  ipratropium (ATROVENT) 0.03 % nasal spray Place into both nostrils. 06/27/21   [provider]  methocarbamol (ROBAXIN) 500 MG tablet Take 500 mg by mouth daily. 01/31/22   [provider]  nitroGLYCERIN  (NITROSTAT ) 0.4 MG SL tablet Place 0.4 mg under the tongue every 5 (five) minutes as needed for chest pain (If no relief after 3rd dose, proceed to the ED).    [provider]  OXYGEN  Inhale 3 L into the lungs continuous. 3 lpm 24/7 DME- [employer]    [provider]  repaglinide (PRANDIN) 2 MG tablet Take 2 mg by mouth 2 (two) times daily before a meal. 07/02/19   [provider]  vitamin B-12 (CYANOCOBALAMIN) 1000 MCG tablet Take 1,000 mcg by mouth daily. 07/14/19   [provider]    Physical Exam    Vital Signs:  Allen [name] does not have vital signs available for review today.***  Given telephonic nature of communication, physical exam is limited. AAOx3. NAD. Normal affect.  Speech and respirations are  unlabored.  Accessory Clinical Findings    None  Assessment & Plan    1.  Preoperative Cardiovascular Risk Assessment:  The patient was advised that if he develops new symptoms prior to surgery to contact our office to arrange for a follow-up visit, and he verbalized understanding.  He may hold Plavix  for 5 days prior to procedure. Please resume Plavix  as soon as possible postprocedure, at the discretion of the surgeon.    A copy of this note will be routed to requesting surgeon.  Time:   Today, I have spent *** minutes with the patient with telehealth technology discussing medical history, symptoms, and management plan.     Lum CROME Louis, NP  03/06/2024, 12:36 PM

## 2024-03-06 NOTE — Progress Notes (Signed)
 Preoperative televisit was scheduled for patient.  He currently resides in [place], [place] . As per [facility] Medical Board telemedicine laws, the patient must reside in the state in which the provider is licensed.  He will require an office visit for cardiac preoperative clearance for upcoming procedure.

## 2024-03-07 NOTE — Telephone Encounter (Signed)
 Pt has been scheduled to see Dr. Debera 03/11/24 for preop clearance.    RE: NEEDS IN OFFICE APPT FOR PREOP CLEARANCE Received: Today Gretel Darryle GORMAN Wynetta Niels CHRISTELLA, CMA Scheduled with mcdowell will call pt with appt 03/11/24 @ 11 am

## 2024-03-10 NOTE — Progress Notes (Signed)
 Remote ICD Transmission

## 2024-03-11 ENCOUNTER — Ambulatory Visit: Attending: Cardiology | Admitting: Cardiology

## 2024-03-11 ENCOUNTER — Encounter: Payer: Self-pay | Admitting: Cardiology

## 2024-03-11 VITALS — BP 130/78 | HR 60 | Ht 62.0 in | Wt 167.4 lb

## 2024-03-11 DIAGNOSIS — I255 Ischemic cardiomyopathy: Secondary | ICD-10-CM | POA: Diagnosis not present

## 2024-03-11 DIAGNOSIS — Z95810 Presence of automatic (implantable) cardiac defibrillator: Secondary | ICD-10-CM | POA: Diagnosis not present

## 2024-03-11 DIAGNOSIS — I5022 Chronic systolic (congestive) heart failure: Secondary | ICD-10-CM | POA: Diagnosis not present

## 2024-03-11 DIAGNOSIS — I5020 Unspecified systolic (congestive) heart failure: Secondary | ICD-10-CM

## 2024-03-11 DIAGNOSIS — Z01810 Encounter for preprocedural cardiovascular examination: Secondary | ICD-10-CM

## 2024-03-11 DIAGNOSIS — I10 Essential (primary) hypertension: Secondary | ICD-10-CM

## 2024-03-11 DIAGNOSIS — N1832 Chronic kidney disease, stage 3b: Secondary | ICD-10-CM

## 2024-03-11 NOTE — Patient Instructions (Signed)
Medication Instructions:  Your physician recommends that you continue on your current medications as directed. Please refer to the Current Medication list given to you today.   Labwork: None today   Testing/Procedures: None today  Follow-Up: 6 months in the [place] office  Any Other Special Instructions Will Be Listed Below (If Applicable).  If you need a refill on your cardiac medications before your next appointment, please call your pharmacy.

## 2024-03-11 NOTE — Progress Notes (Signed)
 Cardiology Office Note  Date: 03/11/2024   ID: Allen [name], DOB 1934-02-01, MRN [id number]  History of Present Illness: Allen [name] is an 89 y.o. male that I last saw in January 2024.  He has had interval follow-up with EP, I reviewed the most recent note from May per Mr. Tillery PA-C.  He presents to the office today for preoperative assessment, plan for circumcision with request to hold Plavix  5 days prior.  He is here today with family members.  From a cardiac perspective, he reports stable NYHA class II-III dyspnea, no angina, no palpitations or syncope.  No unusual weight gain or leg swelling.  St. Jude biventricular ICD in place.  He continues to follow-up with Dr. Nancey.  Device interrogation in August revealed normal function.  He has had no device shocks.  We reviewed his medications.  He reports compliance with current regimen.  RCRI perioperative cardiac risk index is 3 points, 10% chance of major adverse cardiac event.  Proposed surgery however is low risk.  Physical Exam: VS:  BP 130/78 (BP Location: Right Arm, Cuff Size: Normal)   Pulse 60   Ht 5' 2 (1.575 m)   Wt 167 lb 6.4 oz (75.9 kg)   SpO2 92%   BMI 30.62 kg/m , BMI Body mass index is 30.62 kg/m.  Wt Readings from Last 3 Encounters:  03/11/24 167 lb 6.4 oz (75.9 kg)  11/06/23 166 lb 3.2 oz (75.4 kg)  05/07/23 167 lb 12.8 oz (76.1 kg)    General: Patient appears comfortable at rest. HEENT: Conjunctiva and lids normal. Neck: Supple, no elevated JVP or carotid bruits. Lungs: Clear to auscultation, nonlabored breathing at rest. Cardiac: Regular rate and rhythm, no S3, 2/6 systolic murmur. Extremities: No pitting edema.  ECG:  An ECG dated 05/07/2023 was personally reviewed today and demonstrated:  Dual-chamber pacing.  Labwork:  April 2025: TSH 3.383, cholesterol 137, triglycerides 97, HDL 46, LDL 73 August 2025: Hemoglobin 18.7, platelets 151, potassium 5.1, BUN 19, creatinine 1.54, GFR 43,  magnesium  2.1  Other Studies Reviewed Today:  No interval cardiac testing for review today.  Assessment and Plan:  1.  Preoperative cardiac evaluation prior to elective circumcision.  Surgical request indicates plan for general anesthesia, however question if other forms of sedation would be considered.  He may hold Plavix  5 days prior as requested, otherwise continue cardiac regimen.  Clinically stable with no ACS or heart failure in the last 6 months.  RCRI perioperative cardiac event risk index indicates approximately 10% chance of major adverse cardiac event, intermediate risk.  2.  Multivessel CAD status post CABG in 2017 with LIMA to LAD, SVG to first diagonal, SVG to RCA, and sequential SVG to 1st and 2nd obtuse marginal.  No angina at current level of activity.  Continue observation on medical therapy which otherwise includes aspirin  81 mg daily, Lipitor  80 mg daily, and as needed nitroglycerin .  3.  HFrEF with ischemic cardiomyopathy.  LVEF 35 to 40% with normal RV contraction by echocardiogram in July 2023.  Clinically stable with chronic NYHA class II-III dyspnea, no fluid retention.  Currently on Coreg  6.25 mg twice daily, Jardiance  10 mg daily, Entresto  49/51 mg twice daily, and Lasix  20 mg as needed.  Not on MRA with high normal potassium.  4.  Primary hypertension.  No change to current regimen.  5.  Mixed hyperlipidemia.  LDL 73 in April.  Continue Lipitor  80 mg daily.  6.  Aortic stenosis.  Mean  AV gradient 9.7 mmHg with dimensionless index 0.44 by echocardiogram in July 2023.  No significant change in cardiac murmur.  7.  CKD stage IIIb.  Creatinine 1.54 with GFR 43 in August.  Disposition:  Follow up 6 months.  Signed, Jayson JUDITHANN Sierras, M.D., F.A.C.C. [facility]

## 2024-03-18 ENCOUNTER — Encounter: Payer: Self-pay | Admitting: Cardiovascular Disease

## 2024-03-18 ENCOUNTER — Encounter (HOSPITAL_COMMUNITY)
Admission: RE | Admit: 2024-03-18 | Discharge: 2024-03-18 | Disposition: A | Discharge status: Home / Self Care | Source: Ambulatory Visit | Attending: Urology | Admitting: Urology

## 2024-03-18 NOTE — Pre-Procedure Instructions (Signed)
 Called patient for pre-op phone call. His niece Reena was there on speaker since she helps him with his care. Patient states he was not aware to hold his plavix  for 5 days or his jardiance  for 3 and he has taken these up until now. Explained to both parties reason for stopping meds and reason for cancellation. They both verbalize understanding. Reena asks that once he is rescheduled, she be called at [phone] so she can help patient with remembering to stop meds. I explained to her that the plavix  should be stopped 5 days before and the jardiance  3 days before his next surgery date and she verbalized understanding of this. Dr Birdia office and Elveria Salt, [place] scheduler notified of above.

## 2024-03-18 NOTE — Progress Notes (Signed)
 PERIOPERATIVE PRESCRIPTION FOR IMPLANTED CARDIAC DEVICE PROGRAMMING   Patient Information:  Patient: Allen [name]  MRN: [id number]  Date of Birth: [birth year]      Planned Procedure:  Adult Circumcision  Surgeon:  Belvie Clara Date of Procedure:  04/03/2024    Device Information:   Clinic EP Physician:   Dr. Eulas Furbish Device Type:  Defibrillator Manufacturer and Phone #:  St. Jude/Abbott: [phone] Pacemaker Dependent?:  Unknown Date of Last Device Check:  02/18/2024         Normal Device Function?:  Yes     Electrophysiologist's Recommendations:   Have magnet available. Provide continuous ECG monitoring when magnet is used or reprogramming is to be performed.  Procedure should not interfere with device function.  No device programming or magnet placement needed.  Per Device Clinic Standing Orders, Allen [name]  03/18/2024 4:44 PM

## 2024-03-26 ENCOUNTER — Telehealth: Payer: Self-pay

## 2024-03-26 NOTE — Telephone Encounter (Signed)
 I spoke with patient, he states he is unsure if he wants to move forward with surgery at this time.  He states he wanted to move his surgery date out until after his 11/12 appt with Dr. Sherrilee instead of canceling surgery. He agreed to new surgery date of 05/01/2024 and requested I relay this information to his niece Reena.  I called and spoke with Reena and informed her of the updates.  She will attend the appointment with patient on 11/12 and I will send surgery updates to his mychart.

## 2024-03-26 NOTE — Telephone Encounter (Signed)
 Return call to pt regarding Circumcision and if it is necessary , pt was made aware it is complete up to him if he wants to proceed. Pt state's he would like to speak with MD, McKenzie before deciding on having the circumcision. Pt state's he would like to cancel his up coming surgery and keep his upcoming office visited. Voiced understanding

## 2024-03-31 ENCOUNTER — Ambulatory Visit: Attending: Cardiovascular Disease

## 2024-03-31 DIAGNOSIS — Z95810 Presence of automatic (implantable) cardiac defibrillator: Secondary | ICD-10-CM | POA: Diagnosis not present

## 2024-03-31 DIAGNOSIS — I5022 Chronic systolic (congestive) heart failure: Secondary | ICD-10-CM

## 2024-04-01 ENCOUNTER — Other Ambulatory Visit (HOSPITAL_COMMUNITY)

## 2024-04-02 ENCOUNTER — Other Ambulatory Visit: Payer: Self-pay | Admitting: Cardiology

## 2024-04-02 NOTE — Progress Notes (Signed)
 EPIC Encounter for ICM Monitoring  Patient Name: Allen [name] is a 89 y.o. male Date: 04/02/2024 Primary Care Physican: Orpha Yancey LABOR, MD Primary Cardiologist: Debera Electrophysiologist: Mealor Bi-V Pacing: 99%     7/1/20245 Weight: 167 lbs 01/16/2023 Weight: 169 lbs 01/24/2023 Weight: 168 lbs 03/26/2023 Weight: 168-169 lbs 06/14/2023 Weight: 168 lbs 08/15/2023 Weight: 168 lbs 10/23/2023 Weight: 169 lbs 01/18/2024 Weight: 169-170 lbs 02/19/2024 Weight: 166 lbs 04/02/2024 Weight: 166 lbs   AT/AF Burden <1%         Spoke with patient and heart failure questions reviewed.  Transmission results reviewed.  Pt asymptomatic for fluid accumulation.  Reports feeling well at this time and voices no complaints.          Since 02/18/2024 ICM Remote Transmission: CorVue thoracic impedance suggesting intermittent days with possible fluid accumulation.   Pt has Barostim.    Prescribed:  Furosemide  20 mg Take 1 tablet by mouth daily as needed.  Pt takes differently:  He takes every day except for Saturday and Sunday. Jardiance  10 mg take 1 tablet before breakfast   Labs: 01/17/2024 Creatinine 1.54, BUN 19, Potassium 5.1, Sodium 145, GFR 43 09/26/2023 Creatinine 1.75, BUN 23, Potassium 4.7, Sodium 143, GFR 37 A complete set of results can be found in Results Review.   Recommendations:  No changes and encouraged to call if experiencing any fluid symptoms.    Follow-up plan: ICM clinic phone appointment on 05/05/2024.  91 day device clinic remote transmission 04/14/2024.     EP/Cardiology Office Visits:   Advised to call office to schedule appt with Dr Nancey.  Recall 05/04/2024 with Dr Nancey.   Recall 09/07/2024 with Dr Debera.   Copy of ICM check sent to Dr. Nancey.     Remote monitoring is medically necessary for Heart Failure Management.    Daily Thoracic Impedance ICM trend: 01/01/2024 through 03/31/2024.    12-14 Month Thoracic Impedance ICM trend:     Mitzie GORMAN Garner,  RN 04/02/2024 9:15 AM

## 2024-04-07 ENCOUNTER — Ambulatory Visit: Admitting: Urology

## 2024-04-14 ENCOUNTER — Ambulatory Visit (INDEPENDENT_AMBULATORY_CARE_PROVIDER_SITE_OTHER): Payer: Medicare PPO

## 2024-04-14 DIAGNOSIS — I5022 Chronic systolic (congestive) heart failure: Secondary | ICD-10-CM | POA: Diagnosis not present

## 2024-04-14 LAB — CUP PACEART REMOTE DEVICE CHECK
Battery Remaining Longevity: 19 mo
Battery Remaining Percentage: 23 %
Battery Voltage: 2.8 V
Brady Statistic AP VP Percent: 95 %
Brady Statistic AP VS Percent: 1 %
Brady Statistic AS VP Percent: 3.6 %
Brady Statistic AS VS Percent: 1 %
Brady Statistic RA Percent Paced: 94 %
Date Time Interrogation Session: 20251103010016
HighPow Impedance: 70 Ohm
HighPow Impedance: 70 Ohm
Implantable Lead Connection Status: 753985
Implantable Lead Connection Status: 753985
Implantable Lead Connection Status: 753985
Implantable Lead Implant Date: 20200205
Implantable Lead Implant Date: 20200205
Implantable Lead Implant Date: 20200205
Implantable Lead Location: 753858
Implantable Lead Location: 753859
Implantable Lead Location: 753860
Implantable Pulse Generator Implant Date: 20200205
Lead Channel Impedance Value: 1050 Ohm
Lead Channel Impedance Value: 480 Ohm
Lead Channel Impedance Value: 530 Ohm
Lead Channel Pacing Threshold Amplitude: 0.625 V
Lead Channel Pacing Threshold Amplitude: 1.25 V
Lead Channel Pacing Threshold Amplitude: 1.25 V
Lead Channel Pacing Threshold Pulse Width: 0.5 ms
Lead Channel Pacing Threshold Pulse Width: 0.5 ms
Lead Channel Pacing Threshold Pulse Width: 0.5 ms
Lead Channel Sensing Intrinsic Amplitude: 12 mV
Lead Channel Sensing Intrinsic Amplitude: 2.7 mV
Lead Channel Setting Pacing Amplitude: 2 V
Lead Channel Setting Pacing Amplitude: 2.5 V
Lead Channel Setting Pacing Amplitude: 2.5 V
Lead Channel Setting Pacing Pulse Width: 0.5 ms
Lead Channel Setting Pacing Pulse Width: 0.5 ms
Lead Channel Setting Sensing Sensitivity: 0.5 mV
Pulse Gen Serial Number: 9880571

## 2024-04-17 ENCOUNTER — Ambulatory Visit: Payer: Self-pay | Admitting: Cardiovascular Disease

## 2024-04-17 NOTE — Progress Notes (Signed)
 Remote ICD Transmission

## 2024-04-23 ENCOUNTER — Ambulatory Visit (INDEPENDENT_AMBULATORY_CARE_PROVIDER_SITE_OTHER): Admitting: Urology

## 2024-04-23 VITALS — BP 147/88 | HR 77

## 2024-04-23 DIAGNOSIS — Z412 Encounter for routine and ritual male circumcision: Secondary | ICD-10-CM

## 2024-04-23 DIAGNOSIS — N481 Balanitis: Secondary | ICD-10-CM

## 2024-04-23 MED ORDER — CLOTRIMAZOLE-BETAMETHASONE 1-0.05 % EX CREA
1.0000 | TOPICAL_CREAM | Freq: Two times a day (BID) | CUTANEOUS | 3 refills | Status: AC
Start: 2024-04-23 — End: ?

## 2024-04-23 NOTE — Progress Notes (Signed)
 04/23/2024 2:22 PM   Allen [name] Aug 25, 1933 [id number]  Referring provider: Orpha [name] LABOR, MD [address] [place],  [place] [address]  Followup balanitis   HPI: Mr Allen [name] is a 89yo here for followup for balanitis. He is currently scheduled fro circumcision but his balanitis resolved with clotrimazole . He denies any irritation of his foreskin. No difficulty retracting his foreskin. NO issues urinating.    PMH: Past Medical History:  Diagnosis Date   AICD (automatic cardioverter/defibrillator) present    Biventricular ICD (implantable cardioverter-defibrillator) in place 07/17/2018   CAD (coronary artery disease)    a. 04/2016: s/p CABG with LIMA-LAD, SVG-D1, SVG-RCA, and seq SVG-OM1-OM2   CHF (congestive heart failure) (HCC)    CKD (chronic kidney disease)    Essential hypertension    Hyperlipidemia    Ischemic cardiomyopathy    LVEF 35% May 2016   LBBB (left bundle branch block)    Pneumonia due to COVID-19 virus    Sinus bradycardia    Type 2 diabetes mellitus (HCC)     Surgical History: Past Surgical History:  Procedure Laterality Date   BIV ICD INSERTION CRT-D  07/17/2018   BIV ICD INSERTION CRT-D N/A 07/17/2018   Procedure: BIV ICD INSERTION CRT-D;  Surgeon: Allen Agent, MD;  Location: [facility] INVASIVE CV LAB;  Service: Cardiovascular;  Laterality: N/A;   CARDIAC CATHETERIZATION N/A 11/02/2014   Procedure: Left Heart Cath and Coronary Angiography;  Surgeon: Allen LELON Sharps, MD;  Location: [facility] INVASIVE CV LAB;  Service: Cardiovascular;  Laterality: N/A;   CARDIAC CATHETERIZATION N/A 05/03/2016   Procedure: Left Heart Cath and Coronary Angiography;  Surgeon: Allen M Jordan, MD;  Location: [facility] INVASIVE CV LAB;  Service: Cardiovascular;  Laterality: N/A;   CORONARY ARTERY BYPASS GRAFT N/A 05/08/2016   Procedure: CORONARY ARTERY BYPASS GRAFTING (CABG) x 5;  Surgeon: Allen MARLA Fellers, MD;  Location: [facility] OR;  Service: Open Heart Surgery;  Laterality: N/A;   ENDOVEIN HARVEST OF  GREATER SAPHENOUS VEIN  05/08/2016   Procedure: ENDOVEIN HARVEST OF GREATER SAPHENOUS VEIN;  Surgeon: Allen MARLA Fellers, MD;  Location: [facility] OR;  Service: Open Heart Surgery;;   TEE WITHOUT CARDIOVERSION N/A 05/08/2016   Procedure: TRANSESOPHAGEAL ECHOCARDIOGRAM (TEE);  Surgeon: Allen MARLA Fellers, MD;  Location: [facility] OR;  Service: Open Heart Surgery;  Laterality: N/A;    Home Medications:  Allergies as of 04/23/2024   No Known Allergies      Medication List        Accurate as of April 23, 2024  2:22 PM. If you have any questions, ask your nurse or doctor.          aspirin  EC 81 MG tablet Take 81 mg by mouth daily.   atorvastatin  80 MG tablet Commonly known as: LIPITOR  Take 1 tablet (80 mg total) by mouth daily at 6 PM.   carvedilol  6.25 MG tablet Commonly known as: COREG  TAKE 1 TABLET BY MOUTH TWICE A DAY WITH FOOD   clopidogrel  75 MG tablet Commonly known as: PLAVIX  TAKE 1 TABLET BY MOUTH EVERY DAY WITH BREAKFAST   clotrimazole -betamethasone  cream Commonly known as: LOTRISONE  Apply 1 Application topically 2 (two) times daily.   cyanocobalamin 1000 MCG tablet Commonly known as: VITAMIN B12 Take 1,000 mcg by mouth daily.   Entresto  49-51 MG Generic drug: sacubitril -valsartan  TAKE 1 TABLET BY MOUTH TWICE A DAY   furosemide  20 MG tablet Commonly known as: LASIX  Take 1 tablet (20 mg total) by mouth daily as needed (Weight gain  of 2-3 lbs in a 24 hour period or 5 lbs in one week).   ibuprofen 800 MG tablet Commonly known as: ADVIL Take 800 mg by mouth every 6 (six) hours as needed.   insulin  glargine 100 UNIT/ML Solostar Pen Commonly known as: Lantus  SoloStar Inject 20 Units into the skin at bedtime.   ipratropium 0.03 % nasal spray Commonly known as: ATROVENT Place into both nostrils.   Jardiance  10 MG Tabs tablet Generic drug: empagliflozin  TAKE 1 TABLET BY MOUTH EVERY DAY BEFORE BREAKFAST   methocarbamol 500 MG tablet Commonly known as: ROBAXIN Take 500  mg by mouth daily.   nitroGLYCERIN  0.4 MG SL tablet Commonly known as: NITROSTAT  Place 0.4 mg under the tongue every 5 (five) minutes as needed for chest pain (If no relief after 3rd dose, proceed to the ED).   OXYGEN  Inhale 3 L into the lungs continuous. 3 lpm 24/7 DME- [employer]   repaglinide 2 MG tablet Commonly known as: PRANDIN Take 2 mg by mouth 2 (two) times daily before a meal.        Allergies: No Known Allergies  Family History: Family History  Problem Relation Age of Onset   Unexplained death Father    Unexplained death Mother        Old age   Coronary artery disease Brother 94    Social History:  reports that he quit smoking about 12 years ago. His smoking use included cigarettes. He started smoking about 68 years ago. He has a 28 pack-year smoking history. He has never used smokeless tobacco. He reports that he does not drink alcohol and does not use drugs.  ROS: All other review of systems were reviewed and are negative except what is noted above in HPI  Physical Exam: BP (!) 147/88   Pulse 77   Constitutional:  Alert and oriented, No acute distress. HEENT: [place] AT, moist mucus membranes.  Trachea midline, no masses. Cardiovascular: No clubbing, cyanosis, or edema. Respiratory: Normal respiratory effort, no increased work of breathing. GI: Abdomen is soft, nontender, nondistended, no abdominal masses GU: No CVA tenderness.  Lymph: No cervical or inguinal lymphadenopathy. Skin: No rashes, bruises or suspicious lesions. Neurologic: Grossly intact, no focal deficits, moving all 4 extremities. Psychiatric: Normal mood and affect.  Laboratory Data: Lab Results  Component Value Date   WBC 7.2 11/07/2022   HGB 17.3 11/07/2022   HCT 53.5 (H) 11/07/2022   MCV 95 11/07/2022   PLT 178 11/07/2022    Lab Results  Component Value Date   CREATININE 1.50 (H) 11/07/2022    No results found for: PSA  No results found for: TESTOSTERONE  Lab Results   Component Value Date   HGBA1C 7.3 (H) 05/24/2021    Urinalysis    Component Value Date/Time   COLORURINE YELLOW 05/24/2021 1124   APPEARANCEUR CLEAR 05/24/2021 1124   LABSPEC 1.020 05/24/2021 1124   PHURINE 6.0 05/24/2021 1124   GLUCOSEU >=500 (A) 05/24/2021 1124   HGBUR TRACE (A) 05/24/2021 1124   BILIRUBINUR NEGATIVE 05/24/2021 1124   KETONESUR NEGATIVE 05/24/2021 1124   PROTEINUR NEGATIVE 05/24/2021 1124   NITRITE NEGATIVE 05/24/2021 1124   LEUKOCYTESUR NEGATIVE 05/24/2021 1124    Lab Results  Component Value Date   BACTERIA NONE SEEN 05/24/2021    Pertinent Imaging:  No results found for this or any previous visit.  No results found for this or any previous visit.  No results found for this or any previous visit.  No results found for  this or any previous visit.  No results found for this or any previous visit.  No results found for this or any previous visit.  No results found for this or any previous visit.  No results found for this or any previous visit.   Assessment & Plan:    1. Balanitis Clotrimazole  BID prn   No follow-ups on file.  Belvie Clara, MD  [facility] [place]

## 2024-04-28 ENCOUNTER — Encounter (HOSPITAL_COMMUNITY)

## 2024-05-01 ENCOUNTER — Ambulatory Visit (HOSPITAL_COMMUNITY): Admission: RE | Admit: 2024-05-01 | Source: Home / Self Care | Admitting: Urology

## 2024-05-01 ENCOUNTER — Encounter: Payer: Self-pay | Admitting: Urology

## 2024-05-01 ENCOUNTER — Encounter (HOSPITAL_COMMUNITY): Admission: RE | Payer: Self-pay | Source: Home / Self Care

## 2024-05-01 SURGERY — CIRCUMCISION, ADULT
Anesthesia: General

## 2024-05-01 NOTE — Patient Instructions (Signed)
 Balanitis  Balanitis is swelling and irritation of the head of the penis (glans penis). Balanitis occurs most often among males who have not had their foreskin removed (uncircumcised). In uncircumcised males, the condition may also cause inflammation of the skin around the foreskin. Balanitis sometimes causes scarring of the penis or foreskin, which can require surgery. This condition may develop because of an infection or another medical condition. Untreated balanitis can increase the risk of penile cancer. What are the causes? Common causes of this condition include: Irritation and lack of airflow due to fluid (smegma) that can build up on the glans penis. Poor personal hygiene, especially in uncircumcised males. Not cleaning the glans penis and foreskin well can result in a buildup of bacteria, viruses, and yeast, which can lead to infection and inflammation. Other causes include: Chemical irritation from products such as soaps or shower gels, especially those that have fragrance. Chemical irritation can also be caused by condoms, personal lubricants, petroleum jelly, spermicides, fabric softeners, or laundry detergents. Skin conditions, such as eczema, dermatitis, and psoriasis. Allergies to medicines, such as tetracycline and sulfa drugs. What increases the risk? The following factors may make you more likely to develop this condition: Being an uncircumcised male. Having diabetes. Having other medical conditions, including liver cirrhosis, congestive heart failure, or kidney disease. Having infections, such as candidiasis, HPV (human papillomavirus), herpes simplex, gonorrhea, or syphilis. Having a tight foreskin that is difficult to pull back (retract) past the glans penis. Being severely obese. History of reactive arthritis. What are the signs or symptoms? Symptoms of this condition include: Discharge from under the foreskin, and pain or difficulty retracting the foreskin. A bad smell  or itchiness on the penis. Tenderness, redness, and swelling of the glans penis. A rash or sores on the glans penis or foreskin. Inability to get an erection due to pain. Trouble urinating. Scarring of the penis or foreskin, in some cases. How is this diagnosed? This condition may be diagnosed based on a physical exam and tests of a swab of discharge to check for bacterial or fungal infection. You may also have blood tests to check for: Viruses that can cause balanitis. A high blood sugar (glucose) level. This could be a sign of diabetes, which can increase the risk of balanitis. How is this treated? Treatment for this condition depends on the cause. Treatment may include: Improving personal hygiene. Your health care provider may recommend sitting in a bath of warm water that is deep enough to cover your hips and buttocks (sitz bath). Medicines such as: Creams or ointments to reduce swelling (steroids) or to treat an infection. Antibiotic medicine. Antifungal medicine. Having surgery to remove or cut the foreskin (circumcision). This may be done if you have scarring on the foreskin that makes it difficult to retract. Controlling other medical problems that may be causing your condition or making it worse. Follow these instructions at home: Medicines Take over-the-counter and prescription medicines only as told by your health care provider. If you were prescribed an antibiotic medicine, use it as told by your health care provider. Do not stop using the antibiotic even if you start to feel better. General instructions Do not have sex until the condition clears up, or until your health care provider approves. Keep your penis clean and dry. Take sitz baths as recommended by your health care provider. Avoid products that irritate your skin or make symptoms worse, such as soaps and shower gels that have fragrance. Keep all follow-up visits. This is  important. Contact a health care provider  if: Your symptoms get worse or do not improve with home care. You develop chills or a fever. You have trouble urinating. You cannot retract your foreskin. Get help right away if: You develop severe pain. You are unable to urinate. Summary Balanitis is swelling and irritation of the head of the penis (glans penis). This condition is most common among uncircumcised males. Balanitis causes pain, redness, and swelling of the glans penis. Good personal hygiene is important. Treatment may include improving personal hygiene and applying creams or ointments. Contact a health care provider if your symptoms get worse or do not improve with home care. This information is not intended to replace advice given to you by your health care provider. Make sure you discuss any questions you have with your health care provider. Document Revised: 11/10/2020 Document Reviewed: 11/10/2020 Elsevier Patient Education  2024 [employer].

## 2024-05-05 ENCOUNTER — Ambulatory Visit: Attending: Cardiovascular Disease

## 2024-05-05 DIAGNOSIS — I5022 Chronic systolic (congestive) heart failure: Secondary | ICD-10-CM

## 2024-05-05 DIAGNOSIS — Z95810 Presence of automatic (implantable) cardiac defibrillator: Secondary | ICD-10-CM

## 2024-05-05 NOTE — Progress Notes (Cosign Needed Addendum)
 EPIC Encounter for ICM Monitoring  Patient Name: Allen [name] is a 89 y.o. male Date: 05/05/2024 Primary Care Physican: Orpha Yancey LABOR, MD Primary Cardiologist: Debera Electrophysiologist: Mealor Bi-V Pacing: 99%     7/1/20245 Weight: 167 lbs 01/16/2023 Weight: 169 lbs 01/24/2023 Weight: 168 lbs 03/26/2023 Weight: 168-169 lbs 06/14/2023 Weight: 168 lbs 08/15/2023 Weight: 168 lbs 10/23/2023 Weight: 169 lbs 01/18/2024 Weight: 169-170 lbs 02/19/2024 Weight: 166 lbs 04/02/2024 Weight: 166 lbs 05/05/2024 Weight: 166 lbs   AT/AF Burden <1%          Spoke with patient and heart failure questions reviewed.  Transmission results reviewed.  Pt asymptomatic for fluid accumulation.  He had ear infection but now resolved. He doesn't take lasix  on the weekends.  He hasn't taken his dose today yet but will do so when he gets home.        Since 03/31/2024 ICM Remote Transmission: [employer]ue thoracic impedance suggesting possible fluid accumulation starting 05/02/2024.   Pt has Barostim.    Prescribed:  Furosemide  20 mg Take 1 tablet by mouth daily as needed.  Pt takes differently:  He takes every day except for Saturday and Sunday. Jardiance  10 mg take 1 tablet before breakfast   Labs: 01/17/2024 Creatinine 1.54, BUN 19, Potassium 5.1, Sodium 145, GFR 43 09/26/2023 Creatinine 1.75, BUN 23, Potassium 4.7, Sodium 143, GFR 37 A complete set of results can be found in Results Review.   Recommendations:  No changes and encouraged to call if experiencing any fluid symptoms.    Follow-up plan: ICM clinic phone appointment on 06/16/2024.  91 day device clinic remote transmission 07/14/2024.     EP/Cardiology Office Visits:   07/04/2024 with Dr Nancey.  Recall 05/04/2024 with Dr Nancey.   Recall 09/07/2024 with Dr Debera.   Copy of ICM check sent to Dr. Nancey.     Remote monitoring is medically necessary for Heart Failure Management.    Daily Thoracic Impedance ICM trend: 02/05/2024 through  05/05/2024.    12-14 Month Thoracic Impedance ICM trend:     Mitzie GORMAN Garner, RN 05/05/2024 1:30 PM

## 2024-05-14 ENCOUNTER — Ambulatory Visit: Admitting: Urology

## 2024-06-16 ENCOUNTER — Ambulatory Visit: Attending: Cardiovascular Disease

## 2024-06-16 DIAGNOSIS — Z95810 Presence of automatic (implantable) cardiac defibrillator: Secondary | ICD-10-CM

## 2024-06-16 DIAGNOSIS — I5022 Chronic systolic (congestive) heart failure: Secondary | ICD-10-CM | POA: Diagnosis not present

## 2024-06-18 NOTE — Progress Notes (Signed)
 EPIC Encounter for ICM Monitoring  Patient Name: Allen [name] is a 89 y.o. male Date: 06/18/2024 Primary Care Physican: Orpha Yancey LABOR, MD Primary Cardiologist: Debera Electrophysiologist: Mealor Bi-V Pacing: 99%     7/1/20245 Weight: 167 lbs 01/16/2023 Weight: 169 lbs 01/24/2023 Weight: 168 lbs 03/26/2023 Weight: 168-169 lbs 06/14/2023 Weight: 168 lbs 08/15/2023 Weight: 168 lbs 10/23/2023 Weight: 169 lbs 01/18/2024 Weight: 169-170 lbs 02/19/2024 Weight: 166 lbs 04/02/2024 Weight: 166 lbs 05/05/2024 Weight: 166 lbs   AT/AF Burden <1%          Transmission results reviewed.          Since 05/05/2024 ICM Remote Transmission: CorVue thoracic impedance suggesting normal fluid levels.   Pt has Barostim.    Prescribed:  Furosemide  20 mg Take 1 tablet by mouth daily as needed.  Pt takes differently:  He takes every day except for Saturday and Sunday. Jardiance  10 mg take 1 tablet before breakfast   Labs: 01/17/2024 Creatinine 1.54, BUN 19, Potassium 5.1, Sodium 145, GFR 43 09/26/2023 Creatinine 1.75, BUN 23, Potassium 4.7, Sodium 143, GFR 37 A complete set of results can be found in Results Review.   Recommendations:  No changes.    Follow-up plan: ICM clinic phone appointment on 07/22/2024.  91 day device clinic remote transmission 07/14/2024.     EP/Cardiology Office Visits:   07/04/2024 with Dr Nancey.    Recall 09/07/2024 with Dr Debera.   Copy of ICM check sent to Dr. Nancey.     Remote monitoring is medically necessary for Heart Failure Management.    Daily Thoracic Impedance ICM trend: 03/22/2024 through 06/13/2024.    Mitzie GORMAN Garner, RN 06/18/2024 12:03 PM

## 2024-07-04 ENCOUNTER — Ambulatory Visit: Admitting: Cardiovascular Disease

## 2024-07-08 ENCOUNTER — Telehealth: Payer: Self-pay

## 2024-07-08 NOTE — Telephone Encounter (Signed)
 Ms. Bonni states patient is reconsidering the circumcision procedure.  She wanted to know if office visit was needed prior to scheduling.  I called to offer next available office visit with MD, phone was answered and then disconnected.  I called his niece who is on his DPR and coordinated OV. Appt reminder mailed.

## 2024-07-10 NOTE — Progress Notes (Signed)
 31 day ICM Remote transmission canceled due to [facility] clinic is on hold until further notice.  91 day remote monitoring will continue per protocol.

## 2024-07-14 ENCOUNTER — Ambulatory Visit

## 2024-07-14 DIAGNOSIS — I5022 Chronic systolic (congestive) heart failure: Secondary | ICD-10-CM

## 2024-07-14 DIAGNOSIS — Z95810 Presence of automatic (implantable) cardiac defibrillator: Secondary | ICD-10-CM

## 2024-07-15 LAB — CUP PACEART REMOTE DEVICE CHECK
Battery Remaining Longevity: 24 mo
Battery Remaining Percentage: 28 %
Battery Voltage: 2.86 V
Brady Statistic AP VP Percent: 95 %
Brady Statistic AP VS Percent: 1 %
Brady Statistic AS VP Percent: 3.9 %
Brady Statistic AS VS Percent: 1 %
Brady Statistic RA Percent Paced: 94 %
Date Time Interrogation Session: 20260202020017
HighPow Impedance: 69 Ohm
HighPow Impedance: 69 Ohm
Implantable Lead Connection Status: 753985
Implantable Lead Connection Status: 753985
Implantable Lead Connection Status: 753985
Implantable Lead Implant Date: 20200205
Implantable Lead Implant Date: 20200205
Implantable Lead Implant Date: 20200205
Implantable Lead Location: 753858
Implantable Lead Location: 753859
Implantable Lead Location: 753860
Implantable Pulse Generator Implant Date: 20200205
Lead Channel Impedance Value: 1050 Ohm
Lead Channel Impedance Value: 450 Ohm
Lead Channel Impedance Value: 510 Ohm
Lead Channel Pacing Threshold Amplitude: 0.625 V
Lead Channel Pacing Threshold Amplitude: 1.25 V
Lead Channel Pacing Threshold Amplitude: 1.25 V
Lead Channel Pacing Threshold Pulse Width: 0.5 ms
Lead Channel Pacing Threshold Pulse Width: 0.5 ms
Lead Channel Pacing Threshold Pulse Width: 0.5 ms
Lead Channel Sensing Intrinsic Amplitude: 11.7 mV
Lead Channel Sensing Intrinsic Amplitude: 3 mV
Lead Channel Setting Pacing Amplitude: 2 V
Lead Channel Setting Pacing Amplitude: 2.5 V
Lead Channel Setting Pacing Amplitude: 2.5 V
Lead Channel Setting Pacing Pulse Width: 0.5 ms
Lead Channel Setting Pacing Pulse Width: 0.5 ms
Lead Channel Setting Sensing Sensitivity: 0.5 mV
Pulse Gen Serial Number: 9880571

## 2024-07-18 NOTE — Progress Notes (Signed)
 Remote ICD Transmission

## 2024-07-22 ENCOUNTER — Ambulatory Visit

## 2024-08-20 ENCOUNTER — Ambulatory Visit: Admitting: Urology

## 2024-09-12 ENCOUNTER — Ambulatory Visit: Admitting: Cardiovascular Disease

## 2024-10-13 ENCOUNTER — Ambulatory Visit

## 2024-10-24 ENCOUNTER — Ambulatory Visit: Admitting: Urology

## 2025-01-12 ENCOUNTER — Ambulatory Visit
